# Patient Record
Sex: Female | Born: 1935
Health system: Southern US, Community
[De-identification: ages and names within clinical notes are randomized; demographics above are authoritative.]

## PROBLEM LIST (undated history)

## (undated) DIAGNOSIS — M25552 Pain in left hip: Secondary | ICD-10-CM

## (undated) DIAGNOSIS — R6881 Early satiety: Secondary | ICD-10-CM

## (undated) DIAGNOSIS — C9110 Chronic lymphocytic leukemia of B-cell type not having achieved remission: Secondary | ICD-10-CM

## (undated) DIAGNOSIS — K317 Polyp of stomach and duodenum: Secondary | ICD-10-CM

## (undated) DIAGNOSIS — K219 Gastro-esophageal reflux disease without esophagitis: Secondary | ICD-10-CM

## (undated) DIAGNOSIS — R609 Edema, unspecified: Secondary | ICD-10-CM

## (undated) DIAGNOSIS — L0291 Cutaneous abscess, unspecified: Secondary | ICD-10-CM

## (undated) DIAGNOSIS — G709 Myoneural disorder, unspecified: Secondary | ICD-10-CM

## (undated) DIAGNOSIS — K8020 Calculus of gallbladder without cholecystitis without obstruction: Secondary | ICD-10-CM

## (undated) DIAGNOSIS — R7989 Other specified abnormal findings of blood chemistry: Secondary | ICD-10-CM

## (undated) DIAGNOSIS — N390 Urinary tract infection, site not specified: Secondary | ICD-10-CM

## (undated) DIAGNOSIS — D509 Iron deficiency anemia, unspecified: Secondary | ICD-10-CM

## (undated) DIAGNOSIS — S32000A Wedge compression fracture of unspecified lumbar vertebra, initial encounter for closed fracture: Secondary | ICD-10-CM

## (undated) DIAGNOSIS — J449 Chronic obstructive pulmonary disease, unspecified: Secondary | ICD-10-CM

## (undated) DIAGNOSIS — K5900 Constipation, unspecified: Secondary | ICD-10-CM

## (undated) DIAGNOSIS — H269 Unspecified cataract: Secondary | ICD-10-CM

## (undated) DIAGNOSIS — M549 Dorsalgia, unspecified: Secondary | ICD-10-CM

## (undated) DIAGNOSIS — I509 Heart failure, unspecified: Secondary | ICD-10-CM

## (undated) DIAGNOSIS — M545 Low back pain, unspecified: Secondary | ICD-10-CM

## (undated) DIAGNOSIS — R0600 Dyspnea, unspecified: Secondary | ICD-10-CM

## (undated) DIAGNOSIS — M1990 Unspecified osteoarthritis, unspecified site: Secondary | ICD-10-CM

## (undated) DIAGNOSIS — R2689 Other abnormalities of gait and mobility: Secondary | ICD-10-CM

## (undated) DIAGNOSIS — J9610 Chronic respiratory failure, unspecified whether with hypoxia or hypercapnia: Secondary | ICD-10-CM

## (undated) DIAGNOSIS — R1310 Dysphagia, unspecified: Secondary | ICD-10-CM

## (undated) DIAGNOSIS — K5732 Diverticulitis of large intestine without perforation or abscess without bleeding: Secondary | ICD-10-CM

## (undated) DIAGNOSIS — E871 Hypo-osmolality and hyponatremia: Secondary | ICD-10-CM

## (undated) DIAGNOSIS — R945 Abnormal results of liver function studies: Secondary | ICD-10-CM

## (undated) DIAGNOSIS — S73192A Other sprain of left hip, initial encounter: Secondary | ICD-10-CM

## (undated) DIAGNOSIS — Z923 Personal history of irradiation: Secondary | ICD-10-CM

## (undated) DIAGNOSIS — J45909 Unspecified asthma, uncomplicated: Secondary | ICD-10-CM

## (undated) DIAGNOSIS — IMO0002 Reserved for concepts with insufficient information to code with codable children: Secondary | ICD-10-CM

## (undated) DIAGNOSIS — B029 Zoster without complications: Secondary | ICD-10-CM

## (undated) DIAGNOSIS — E440 Moderate protein-calorie malnutrition: Secondary | ICD-10-CM

## (undated) DIAGNOSIS — K5720 Diverticulitis of large intestine with perforation and abscess without bleeding: Secondary | ICD-10-CM

## (undated) DIAGNOSIS — K5792 Diverticulitis of intestine, part unspecified, without perforation or abscess without bleeding: Secondary | ICD-10-CM

## (undated) DIAGNOSIS — W19XXXA Unspecified fall, initial encounter: Secondary | ICD-10-CM

## (undated) DIAGNOSIS — N321 Vesicointestinal fistula: Secondary | ICD-10-CM

## (undated) DIAGNOSIS — J4540 Moderate persistent asthma, uncomplicated: Secondary | ICD-10-CM

## (undated) DIAGNOSIS — Z5189 Encounter for other specified aftercare: Secondary | ICD-10-CM

## (undated) DIAGNOSIS — D696 Thrombocytopenia, unspecified: Secondary | ICD-10-CM

## (undated) HISTORY — DX: Dorsalgia, unspecified: M54.9

## (undated) HISTORY — DX: Edema, unspecified: R60.9

## (undated) HISTORY — DX: Iron deficiency anemia, unspecified: D50.9

## (undated) HISTORY — DX: Early satiety: R68.81

## (undated) HISTORY — DX: Diverticulitis of large intestine with perforation and abscess without bleeding: K57.20

## (undated) HISTORY — PX: OTHER SURGICAL HISTORY: SHX169

## (undated) HISTORY — DX: Wedge compression fracture of unspecified lumbar vertebra, initial encounter for closed fracture: S32.000A

## (undated) HISTORY — DX: Gastro-esophageal reflux disease without esophagitis: K21.9

## (undated) HISTORY — DX: Unspecified cataract: H26.9

## (undated) HISTORY — DX: Myoneural disorder, unspecified: G70.9

## (undated) HISTORY — DX: Dysphagia, unspecified: R13.10

## (undated) HISTORY — DX: Abnormal results of liver function studies: R94.5

## (undated) HISTORY — DX: Pain in left hip: M25.552

## (undated) HISTORY — DX: Hypo-osmolality and hyponatremia: E87.1

## (undated) HISTORY — DX: Chronic obstructive pulmonary disease, unspecified: J44.9

## (undated) HISTORY — DX: Unspecified fall, initial encounter: W19.XXXA

## (undated) HISTORY — DX: Dyspnea, unspecified: R06.00

## (undated) HISTORY — DX: Personal history of irradiation: Z92.3

## (undated) HISTORY — DX: Constipation, unspecified: K59.00

## (undated) HISTORY — DX: Cutaneous abscess, unspecified: L02.91

## (undated) HISTORY — DX: Vesicointestinal fistula: N32.1

## (undated) HISTORY — DX: Heart failure, unspecified: I50.9

## (undated) HISTORY — DX: Chronic respiratory failure, unspecified whether with hypoxia or hypercapnia: J96.10

## (undated) HISTORY — DX: Reserved for concepts with insufficient information to code with codable children: IMO0002

## (undated) HISTORY — DX: Other abnormalities of gait and mobility: R26.89

## (undated) HISTORY — DX: Low back pain: M54.5

## (undated) HISTORY — DX: Polyp of stomach and duodenum: K31.7

## (undated) HISTORY — DX: Other specified abnormal findings of blood chemistry: R79.89

## (undated) HISTORY — DX: Chronic lymphocytic leukemia of B-cell type not having achieved remission: C91.10

## (undated) HISTORY — DX: Moderate persistent asthma, uncomplicated: J45.40

## (undated) HISTORY — DX: Moderate protein-calorie malnutrition: E44.0

## (undated) HISTORY — DX: Urinary tract infection, site not specified: N39.0

## (undated) HISTORY — DX: Diverticulitis of large intestine without perforation or abscess without bleeding: K57.32

## (undated) HISTORY — DX: Thrombocytopenia, unspecified: D69.6

## (undated) HISTORY — DX: Low back pain, unspecified: M54.50

## (undated) HISTORY — DX: Calculus of gallbladder without cholecystitis without obstruction: K80.20

## (undated) HISTORY — DX: Diverticulitis of intestine, part unspecified, without perforation or abscess without bleeding: K57.92

## (undated) HISTORY — DX: Other sprain of left hip, initial encounter: S73.192A

## (undated) HISTORY — DX: Encounter for other specified aftercare: Z51.89

---

## 2011-07-27 ENCOUNTER — Telehealth: Payer: Self-pay | Admitting: Hematology & Oncology

## 2011-07-27 NOTE — Telephone Encounter (Signed)
Talked to pt's son, he requested that pt, see Dr [name]. They are [place] minutes from [facility] and and it will be convenient for them. Gave the chart to [facility], also gave pt's son Willeen Cass the number to [facility].

## 2011-07-28 ENCOUNTER — Telehealth: Payer: Self-pay | Admitting: Hematology & Oncology

## 2011-07-28 NOTE — Telephone Encounter (Signed)
Pt aware of 2-12 appointment. Son will be here to interpret, she speaks little english, son does not want [place] to get interpreter.

## 2011-08-11 ENCOUNTER — Ambulatory Visit: Payer: Self-pay

## 2011-08-11 ENCOUNTER — Other Ambulatory Visit: Payer: Self-pay | Admitting: Lab

## 2011-08-11 ENCOUNTER — Ambulatory Visit: Payer: Self-pay | Admitting: Hematology & Oncology

## 2011-08-14 ENCOUNTER — Telehealth: Payer: Self-pay | Admitting: Hematology & Oncology

## 2011-08-14 NOTE — Telephone Encounter (Signed)
Left pt message to call and reschedule

## 2011-08-19 ENCOUNTER — Ambulatory Visit: Payer: Self-pay

## 2011-08-19 ENCOUNTER — Other Ambulatory Visit: Payer: Self-pay | Admitting: Lab

## 2011-08-19 ENCOUNTER — Encounter: Payer: Self-pay | Admitting: Oncology

## 2011-09-10 ENCOUNTER — Telehealth: Payer: Self-pay | Admitting: Hematology & Oncology

## 2011-09-10 NOTE — Telephone Encounter (Signed)
Son made 10-01-11 appointment for mom

## 2011-09-28 ENCOUNTER — Telehealth: Payer: Self-pay | Admitting: Hematology & Oncology

## 2011-09-28 NOTE — Telephone Encounter (Signed)
Family member called canceled 10-01-11 appointment. Left message with referring Alona Bene that both her and her husband canceled appointments.

## 2011-10-01 ENCOUNTER — Other Ambulatory Visit: Payer: Self-pay | Admitting: Lab

## 2011-10-01 ENCOUNTER — Ambulatory Visit: Payer: Self-pay

## 2011-10-01 ENCOUNTER — Ambulatory Visit: Payer: Self-pay | Admitting: Hematology & Oncology

## 2011-12-13 ENCOUNTER — Encounter (HOSPITAL_BASED_OUTPATIENT_CLINIC_OR_DEPARTMENT_OTHER): Payer: Self-pay | Admitting: Emergency Medicine

## 2011-12-13 ENCOUNTER — Emergency Department (HOSPITAL_BASED_OUTPATIENT_CLINIC_OR_DEPARTMENT_OTHER)
Admission: EM | Admit: 2011-12-13 | Discharge: 2011-12-13 | Disposition: A | Discharge status: Home / Self Care | Payer: Medicare Other | Attending: Emergency Medicine | Admitting: Emergency Medicine

## 2011-12-13 ENCOUNTER — Emergency Department (HOSPITAL_BASED_OUTPATIENT_CLINIC_OR_DEPARTMENT_OTHER): Payer: Medicare Other

## 2011-12-13 DIAGNOSIS — J45909 Unspecified asthma, uncomplicated: Secondary | ICD-10-CM | POA: Insufficient documentation

## 2011-12-13 DIAGNOSIS — C9110 Chronic lymphocytic leukemia of B-cell type not having achieved remission: Secondary | ICD-10-CM | POA: Insufficient documentation

## 2011-12-13 DIAGNOSIS — M129 Arthropathy, unspecified: Secondary | ICD-10-CM | POA: Insufficient documentation

## 2011-12-13 DIAGNOSIS — R05 Cough: Secondary | ICD-10-CM | POA: Insufficient documentation

## 2011-12-13 DIAGNOSIS — R059 Cough, unspecified: Secondary | ICD-10-CM | POA: Insufficient documentation

## 2011-12-13 DIAGNOSIS — D72829 Elevated white blood cell count, unspecified: Secondary | ICD-10-CM

## 2011-12-13 DIAGNOSIS — J40 Bronchitis, not specified as acute or chronic: Secondary | ICD-10-CM

## 2011-12-13 HISTORY — DX: Unspecified osteoarthritis, unspecified site: M19.90

## 2011-12-13 HISTORY — DX: Chronic lymphocytic leukemia of B-cell type not having achieved remission: C91.10

## 2011-12-13 HISTORY — DX: Unspecified asthma, uncomplicated: J45.909

## 2011-12-13 HISTORY — DX: Zoster without complications: B02.9

## 2011-12-13 LAB — BASIC METABOLIC PANEL
CO2: 25 mEq/L (ref 19–32)
Chloride: 98 mEq/L (ref 96–112)
Glucose, Bld: 116 mg/dL — ABNORMAL HIGH (ref 70–99)
Sodium: 132 mEq/L — ABNORMAL LOW (ref 135–145)

## 2011-12-13 LAB — CBC
Hemoglobin: 7.8 g/dL — ABNORMAL LOW (ref 12.0–15.0)
MCV: 67.9 fL — ABNORMAL LOW (ref 78.0–100.0)
Platelets: 132 10*3/uL — ABNORMAL LOW (ref 150–400)
RBC: 3.49 MIL/uL — ABNORMAL LOW (ref 3.87–5.11)
WBC: 210.8 10*3/uL (ref 4.0–10.5)

## 2011-12-13 LAB — DIFFERENTIAL
Basophils Relative: 0 % (ref 0–1)
Eosinophils Relative: 0 % (ref 0–5)
Monocytes Relative: 2 % — ABNORMAL LOW (ref 3–12)
Neutrophils Relative %: 4 % — ABNORMAL LOW (ref 43–77)

## 2011-12-13 MED ORDER — ALBUTEROL SULFATE HFA 108 (90 BASE) MCG/ACT IN AERS: 1.0000 | INHALATION_SPRAY | Freq: Four times a day (QID) | RESPIRATORY_TRACT | Status: DC | PRN

## 2011-12-13 MED ORDER — IPRATROPIUM BROMIDE 0.02 % IN SOLN
0.5000 mg | Freq: Once | RESPIRATORY_TRACT | Status: AC
  Administered 2011-12-13: 0.5 mg via RESPIRATORY_TRACT

## 2011-12-13 MED ORDER — ALBUTEROL SULFATE (5 MG/ML) 0.5% IN NEBU
5.0000 mg | INHALATION_SOLUTION | Freq: Once | RESPIRATORY_TRACT | Status: AC
  Administered 2011-12-13: 5 mg via RESPIRATORY_TRACT

## 2011-12-13 MED ORDER — ALBUTEROL SULFATE (5 MG/ML) 0.5% IN NEBU
INHALATION_SOLUTION | RESPIRATORY_TRACT | Status: AC
  Administered 2011-12-13: 5 mg via RESPIRATORY_TRACT
  Filled 2011-12-13: qty 1

## 2011-12-13 MED ORDER — DEXTROSE 5 % IV SOLN
1.0000 g | Freq: Once | INTRAVENOUS | Status: AC
  Administered 2011-12-13: 1 g via INTRAVENOUS
  Filled 2011-12-13: qty 10

## 2011-12-13 MED ORDER — IPRATROPIUM BROMIDE 0.02 % IN SOLN
RESPIRATORY_TRACT | Status: AC
  Administered 2011-12-13: 0.5 mg via RESPIRATORY_TRACT
  Filled 2011-12-13: qty 2.5

## 2011-12-13 MED ORDER — METHYLPREDNISOLONE SODIUM SUCC 125 MG IJ SOLR
125.0000 mg | Freq: Once | INTRAMUSCULAR | Status: AC
  Administered 2011-12-13: 125 mg via INTRAVENOUS
  Filled 2011-12-13: qty 2

## 2011-12-13 MED ORDER — SODIUM CHLORIDE 0.9 % IV SOLN
Freq: Once | INTRAVENOUS | Status: AC
  Administered 2011-12-13: 14:00:00 via INTRAVENOUS

## 2011-12-13 MED ORDER — MOXIFLOXACIN HCL 400 MG PO TABS: 400.0000 mg | ORAL_TABLET | Freq: Every day | ORAL | Status: AC

## 2011-12-13 MED ORDER — PREDNISONE (PAK) 10 MG PO TABS: ORAL_TABLET | ORAL | Status: AC

## 2011-12-13 MED ORDER — POLYETHYLENE GLYCOL 3350 17 GM/SCOOP PO POWD: 17.0000 g | Freq: Every day | ORAL | Status: DC

## 2011-12-13 NOTE — ED Notes (Signed)
Pt will be placed on 2lpm nasal cannula post treatment.

## 2011-12-13 NOTE — Discharge Instructions (Signed)
Please your albuterol treatments at home regularly. Follow-up with an Oncologist soon regarding your CLL. Take all medication as prescribed.

## 2011-12-13 NOTE — ED Notes (Signed)
Patient transported to X-ray 

## 2011-12-13 NOTE — ED Provider Notes (Signed)
History     CSN: [id number]  Arrival date & time 12/13/11  1139   First MD Initiated Contact with Patient 12/13/11 1223      12:37 PM HPI Son reports patient has had worsening cough, and congestion for 1 week. Reports a history of asthma and allergies. States that initially patient was having sinus and nasal congestion. Reports now she is coughing every few minutes. Cough is productive with yellow and brown sputum. Reports mild SOB. States albuterol and advair inhaler are not working and neither is her albuterol nebulizer. Denies CP or fever. Reports it has been years since her last asthma attack. States last admission for asthma was 6 years ago. Son reports prednisone is the only thing that work when she "gets this way"  Patient is a 78 y.o. female presenting with cough. The history is provided by the patient.  Cough This is a new problem. Episode onset: 1 week. The problem occurs constantly. The problem has been gradually worsening. The cough is productive of brown sputum. Maximum temperature: Subjective fever. Associated symptoms include rhinorrhea, shortness of breath and wheezing. Pertinent negatives include no chest pain, no chills, no sweats, no headaches, no sore throat and no myalgias. Treatments tried: allbuterol. The treatment provided no relief. She is not a smoker. Her past medical history is significant for asthma.    Past Medical History  Diagnosis Date  . Asthma   . Chronic lymphatic leukemia   . Arthritis   . Shingles     History reviewed. No pertinent past surgical history.  History reviewed. No pertinent family history.  History  Substance Use Topics  . Smoking status: Never Smoker   . Smokeless tobacco: Not on file  . Alcohol Use: No    OB History    Grav Para Term Preterm Abortions TAB SAB Ect Mult Living                  Review of Systems  Constitutional: Positive for fever. Negative for chills.  HENT: Positive for congestion, rhinorrhea and postnasal  drip. Negative for sore throat.   Respiratory: Positive for cough, shortness of breath and wheezing.   Cardiovascular: Negative for chest pain.  Musculoskeletal: Negative for myalgias.  Neurological: Negative for dizziness, weakness, numbness and headaches.  All other systems reviewed and are negative.    Allergies  Review of patient's allergies indicates no known allergies.  Home Medications   Current Outpatient Rx  Name Route Sig Dispense Refill  . ALBUTEROL SULFATE (5 MG/ML) 0.5% IN NEBU Nebulization Take 2.5 mg by nebulization every 6 (six) hours as needed.    Marland Kitchen GABAPENTIN 100 MG PO CAPS Oral Take 100 mg by mouth as needed.    . GUAIFENESIN ER 600 MG PO TB12 Oral Take 600 mg by mouth 2 (two) times daily as needed.      BP 111/44  Pulse 104  Temp 99.6 F (37.6 C) (Oral)  Resp 34  SpO2 92%  Physical Exam  Vitals reviewed. Constitutional: She is oriented to person, place, and time. Vital signs are normal. She appears well-developed and well-nourished.  HENT:  Head: Normocephalic and atraumatic.  Mouth/Throat: Oropharynx is clear and moist. No oropharyngeal exudate.  Eyes: Conjunctivae are normal. Pupils are equal, round, and reactive to light.  Neck: Normal range of motion. Neck supple.  Cardiovascular: Normal rate, regular rhythm and normal heart sounds.  Exam reveals no friction rub.   No murmur heard. Pulmonary/Chest: Effort normal. She has decreased breath sounds in  the right middle field and the right lower field. She has no wheezes. She has no rhonchi. She has no rales. She exhibits no tenderness.  Musculoskeletal: Normal range of motion.  Neurological: She is alert and oriented to person, place, and time. Coordination normal.  Skin: Skin is warm and dry. No rash noted. No erythema. No pallor.    ED Course  Procedures  Results for orders placed during the hospital encounter of 12/13/11  CBC      Component Value Range   WBC 210.8 (*) 4.0 - 10.5 K/uL   RBC 3.49  (*) 3.87 - 5.11 MIL/uL   Hemoglobin 7.8 (*) 12.0 - 15.0 g/dL   HCT 40.9 (*) 81.1 - 91.4 %   MCV 67.9 (*) 78.0 - 100.0 fL   MCH 22.3 (*) 26.0 - 34.0 pg   MCHC 32.9  30.0 - 36.0 g/dL   RDW 78.2  95.6 - 21.3 %   Platelets 132 (*) 150 - 400 K/uL  DIFFERENTIAL      Component Value Range   Neutrophils Relative 4 (*) 43 - 77 %   Lymphocytes Relative 94 (*) 12 - 46 %   Monocytes Relative 2 (*) 3 - 12 %   Eosinophils Relative 0  0 - 5 %   Basophils Relative 0  0 - 1 %   Neutro Abs 8.4 (*) 1.7 - 7.7 K/uL   Lymphs Abs 198.2 (*) 0.7 - 4.0 K/uL   Monocytes Absolute 4.2 (*) 0.1 - 1.0 K/uL   Eosinophils Absolute 0.0  0.0 - 0.7 K/uL   Basophils Absolute 0.0  0.0 - 0.1 K/uL   WBC Morphology SMUDGE CELLS    BASIC METABOLIC PANEL      Component Value Range   Sodium 132 (*) 135 - 145 mEq/L   Potassium 3.8  3.5 - 5.1 mEq/L   Chloride 98  96 - 112 mEq/L   CO2 25  19 - 32 mEq/L   Glucose, Bld 116 (*) 70 - 99 mg/dL   BUN 15  6 - 23 mg/dL   Creatinine, Ser 0.86  0.50 - 1.10 mg/dL   Calcium 8.6  8.4 - 57.8 mg/dL   GFR calc non Af Amer 68 (*) >90 mL/min   GFR calc Af Amer 79 (*) >90 mL/min   Dg Chest 2 View  12/13/2011  *RADIOLOGY REPORT*  Clinical Data: Shortness of breath and cough for the past week.  CHEST - 2 VIEW  Comparison: No priors.  Findings: Lungs appear hyperexpanded with flattening of the hemidiaphragms, increased retrosternal air space and pruning of the pulmonary vasculature in the periphery, suggestive of underlying COPD.  Mild diffuse peribronchial cuffing.  There is significant atelectasis in the right middle lobe. No evidence of pulmonary edema.  Heart size is borderline enlarged.  Atherosclerotic calcifications within the arch of the aorta.  IMPRESSION: 1.  Appearance of the lungs suggests underlying COPD. 2.  There is atelectasis within the right middle lobe. Superimposed airspace consolidation from infection would be difficult to entirely exclude. 3.  Atherosclerosis.  Original Report  Authenticated By: Florencia Reasons, M.D.     MDM  3:16 PM Patient has completed IV antibiotics. Patient reports improvement after Solu-Medrol and albuterol treatments. Will treat patient with antibiotic and advised regular albuterol treatments. Patient likely has exacerbation of chronic COPD. Patient has an abnormal white count of 210. 8 Discussed this with oncology, Dr. Egbert Garibaldi. States this is a normal variant due to her CLL. He will be okay  to give prednisone at discharge. Does not recommend admission for treatment for CLL. States that she just needs close oncology followup. Son reports that he is currently trying to make an appointment with Dr. [name]. Patient and family are ready for discharge.         Thomasene Lot, PA-C 12/13/11 1519

## 2011-12-13 NOTE — ED Notes (Signed)
Shortness of breath, congestion x one week.  Has been taking nebulizers at home but continues to have symptoms.  Pt having more SOB with exertion, audible wheezing noted.

## 2011-12-13 NOTE — ED Notes (Signed)
Discharged with two prescriptions.

## 2011-12-13 NOTE — ED Provider Notes (Signed)
Medical screening examination/treatment/procedure(s) were performed by non-physician practitioner and as supervising physician I was immediately available for consultation/collaboration.  Ethelda Chick, MD 12/13/11 [phone]

## 2011-12-14 LAB — PATHOLOGIST SMEAR REVIEW

## 2012-03-14 ENCOUNTER — Other Ambulatory Visit: Payer: Medicare Other | Admitting: Lab

## 2012-03-14 ENCOUNTER — Ambulatory Visit: Payer: Medicare Other | Admitting: Hematology & Oncology

## 2012-03-14 ENCOUNTER — Ambulatory Visit: Payer: Medicare Other

## 2012-06-06 ENCOUNTER — Ambulatory Visit (HOSPITAL_BASED_OUTPATIENT_CLINIC_OR_DEPARTMENT_OTHER): Payer: Medicare Other | Admitting: Hematology & Oncology

## 2012-06-06 ENCOUNTER — Encounter: Payer: Self-pay | Admitting: Hematology & Oncology

## 2012-06-06 ENCOUNTER — Ambulatory Visit (HOSPITAL_BASED_OUTPATIENT_CLINIC_OR_DEPARTMENT_OTHER): Payer: Medicare Other | Admitting: Lab

## 2012-06-06 ENCOUNTER — Ambulatory Visit: Payer: Medicare Other

## 2012-06-06 DIAGNOSIS — C9110 Chronic lymphocytic leukemia of B-cell type not having achieved remission: Secondary | ICD-10-CM

## 2012-06-06 DIAGNOSIS — D509 Iron deficiency anemia, unspecified: Secondary | ICD-10-CM

## 2012-06-06 HISTORY — DX: Chronic lymphocytic leukemia of B-cell type not having achieved remission: C91.10

## 2012-06-06 HISTORY — DX: Iron deficiency anemia, unspecified: D50.9

## 2012-06-06 LAB — CBC WITH DIFFERENTIAL (CANCER CENTER ONLY)
BASO#: 0 10*3/uL (ref 0.0–0.2)
BASO%: 0 % (ref 0.0–2.0)
Eosinophils Absolute: 0.3 10*3/uL (ref 0.0–0.5)
HCT: 26.6 % — ABNORMAL LOW (ref 34.8–46.6)
HGB: 8.2 g/dL — ABNORMAL LOW (ref 11.6–15.9)
LYMPH#: 318.4 10*3/uL — ABNORMAL HIGH (ref 0.9–3.3)
MONO#: 7.4 10*3/uL — ABNORMAL HIGH (ref 0.1–0.9)
NEUT%: 1.8 % — ABNORMAL LOW (ref 39.6–80.0)
RBC: 3.86 10*6/uL (ref 3.70–5.32)
WBC: 331.8 10*3/uL (ref 3.9–10.0)

## 2012-06-06 MED ORDER — ALLOPURINOL 100 MG PO TABS: 200.0000 mg | ORAL_TABLET | Freq: Every day | ORAL | Status: DC

## 2012-06-06 MED ORDER — FAMCICLOVIR 250 MG PO TABS: 250.0000 mg | ORAL_TABLET | Freq: Two times a day (BID) | ORAL | Status: DC

## 2012-06-06 NOTE — Progress Notes (Signed)
This office note has been dictated.

## 2012-06-07 ENCOUNTER — Encounter (HOSPITAL_COMMUNITY)
Admission: RE | Admit: 2012-06-07 | Discharge: 2012-06-07 | Disposition: A | Discharge status: Home / Self Care | Payer: Medicare Other | Source: Ambulatory Visit | Attending: Hematology & Oncology | Admitting: Hematology & Oncology

## 2012-06-07 ENCOUNTER — Other Ambulatory Visit (HOSPITAL_BASED_OUTPATIENT_CLINIC_OR_DEPARTMENT_OTHER): Payer: Medicare Other | Admitting: Lab

## 2012-06-07 ENCOUNTER — Other Ambulatory Visit: Payer: Self-pay | Admitting: Medical

## 2012-06-07 DIAGNOSIS — D649 Anemia, unspecified: Secondary | ICD-10-CM

## 2012-06-07 DIAGNOSIS — C9110 Chronic lymphocytic leukemia of B-cell type not having achieved remission: Secondary | ICD-10-CM | POA: Insufficient documentation

## 2012-06-07 LAB — PREPARE RBC (CROSSMATCH)

## 2012-06-07 NOTE — Progress Notes (Signed)
DIAGNOSIS:  Stage C chronic lymphocytic leukemia.  HISTORY OF PRESENT ILLNESS:  Stefanie Henry is an incredibly nice 78 year old [place] female.  She is the wife of my patient Terrilynn Postell.  She does have a known diagnosis of CLL.  She has been followed out in [place] by Dr. Alena Bills.  According to the son, Dr. Alena Bills appeared to treat her with 1 dose of chlorambucil a few years ago.  This led to her having shingles on the left side of the face.  After that, she did not want any more treatments.  She is very tired.  She has been anemic.  According to the son, it sounds like she is iron deficient.  She has not noticed any palpable lymph glands.  She has had no obvious weight loss.  Her appetite is okay.  She does get full a little bit easy.  She, in the past, did have cytogenetics done.  She also had FISH studies done.  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  This is a favorable prognostic category.  On flow cytometry everything flowed as typical B cell CLL.  She had scans that were done a year or so ago negative for anything with the brain.  CT of the chest, abdomen, and pelvis showed splenomegaly. There was no adenopathy within the abdomen or pelvis.  She did some bilateral axillary lymph nodes.  There were no mediastinal nodes.  She has had no bleeding.  She has had no headache.  She has had post- herpetic neuralgia which is being treated with Neurontin.  Again, she has been noted to be iron deficient in the past.  She I think had lab work done over in [place].  Unfortunately, I cannot find iron studies, although the last ones that I did find were back in I think January, which showed a ferritin of 107 with an iron saturation of only 17%.  She, I think, has not been transfused before.  Again, we are now seeing her to provide further care for her CLL.  PAST MEDICAL HISTORY:  Remarkable for:  1. COPD. 2. Arthritis.  ALLERGIES:  None.  MEDICATIONS:  Proventil  inhaler 1-2 puffs q.6 hours p.r.n., Neurontin 100 mg p.o. as needed.  SOCIAL HISTORY:  Negative for any tobacco or alcohol use.  There are no obvious occupational exposures.  FAMILY HISTORY:  Pretty much noncontributory.  REVIEW OF SYSTEMS:  As stated in the history present illness.  No additional findings are noted on a 12-system review.  Post-herpetic neuralgia is the only positive I can find.  PHYSICAL EXAMINATION:  General:  This is an elderly [place] female in no obvious distress.  Vital signs:  Temperature of 97.9, pulse 74, respiratory rate 16, blood pressure 103/58.  Weight is 118.  Head and neck:  Normocephalic, atraumatic skull.  There are no ocular or oral lesions.  There are no palpable cervical or supraclavicular lymph nodes. Lungs:  Clear bilaterally.  Cardiac:  Regular rate and rhythm with a normal S1 and S2.  Axillary:  Some fullness in the axilla bilaterally. Abdomen:  Soft with good bowel sounds.  Spleen tip be may be at the left costal margin.  There is no hepatomegaly.  There is no fluid wave. Back:  No tenderness over the spine, ribs, or hips.  Extremities:  Some trace edema in her legs.  She has decent range of motion of her joints. Skin:  No rashes, ecchymosis, or petechia.  LABORATORY STUDIES:  Shows a  white cell count of 331,000, hemoglobin 8.2, hematocrit 26.6, platelet count 153.  MCV is 69.  Her peripheral smear shows some anisocytosis.  She does have significant microcytic red cells.  There are some hypochromic red cells.  There are no teardrop cells.  I see no nucleated red blood cells.  There is no rouleaux formation.  White cells are markedly increased in number.  She has been pretty much predominance of mature lymphocytes.  There are no nucleoli within the lymphocytes as far as I can tell.  There are no blasts.  Platelets are adequate in number and size.  IMPRESSION:  Ms. Applin is an 78 year old [place] female with stage C chronic lymphocytic  leukemia.  She was treated in the past.  It sounds like she got 1 dose of chlorambucil and that was it.  I believe that the problems are multifactorial.  I believe that we need to get her white cell count down.  I believe if we can do this, then her hemoglobin will improve.  I also believe that she is iron deficient.  The blood smear is consistent with iron deficiency.  As such, I think IV iron will help her.  I do believe that she will also need to be transfused.  If we do give her IV iron, it will be a month or so before it can really help.  If we will try to give her chemotherapy, I think that this will worsen the anemia and could cause more stress on her body.  As such, I do think that a transfusion of 2 units of blood would help her.  I am putting her on allopurinol.  I am also going to put her on Famvir.  I think [name] would be an excellent choice for her.  I would dose- reduce the Treanda to 60% of standard dose.  I realize that we are not going to cure her.  Our goal is to try to control the white cell count so that her hemoglobin will improve.  In addition, I think by controlling the white cell count, her symptoms and fatigue also will get better.  Her son clearly understands that we are not going to cure this.  He is looking for a better quality of life for his mother.  I think with 1 or 2 cycles of chemotherapy with Treanda we can accomplish this.  We will go ahead with the blood and everything first this week.  We will then see about the Treanda next week.  I spent a good hour or so with Ms. Braud and her son.  Her husband was out getting treatment today for his myeloma.    ______________________________ Josph Macho, M.D. PRE/MEDQ  D:  06/06/2012  T:  06/07/2012  Job:  [id number]

## 2012-06-08 LAB — PROTEIN ELECTROPHORESIS, SERUM, WITH REFLEX
Albumin ELP: 66.8 % — ABNORMAL HIGH (ref 55.8–66.1)
Alpha-1-Globulin: 5 % — ABNORMAL HIGH (ref 2.9–4.9)
Beta Globulin: 7 % (ref 4.7–7.2)
Total Protein, Serum Electrophoresis: 5.7 g/dL — ABNORMAL LOW (ref 6.0–8.3)

## 2012-06-08 LAB — IGG, IGA, IGM
IgA: 22 mg/dL — ABNORMAL LOW (ref 69–380)
IgM, Serum: <5 mg/dL — ABNORMAL LOW (ref 52–322)

## 2012-06-09 ENCOUNTER — Ambulatory Visit (HOSPITAL_BASED_OUTPATIENT_CLINIC_OR_DEPARTMENT_OTHER): Payer: Medicaid Other

## 2012-06-09 VITALS — BP 122/64 | HR 74 | Temp 98.0°F | Resp 20

## 2012-06-09 DIAGNOSIS — D509 Iron deficiency anemia, unspecified: Secondary | ICD-10-CM

## 2012-06-09 DIAGNOSIS — C9110 Chronic lymphocytic leukemia of B-cell type not having achieved remission: Secondary | ICD-10-CM

## 2012-06-09 LAB — PREPARE RBC (CROSSMATCH)

## 2012-06-09 MED ORDER — SODIUM CHLORIDE 0.9 % IV SOLN
250.0000 mL | Freq: Once | INTRAVENOUS | Status: AC
  Administered 2012-06-09: 250 mL via INTRAVENOUS

## 2012-06-09 MED ORDER — FUROSEMIDE 10 MG/ML IJ SOLN
10.0000 mg | Freq: Once | INTRAMUSCULAR | Status: AC
  Administered 2012-06-09: 10 mg via INTRAVENOUS

## 2012-06-09 MED ORDER — FERUMOXYTOL INJECTION 510 MG/17 ML
1020.0000 mg | Freq: Once | INTRAVENOUS | Status: AC
  Administered 2012-06-09: 1020 mg via INTRAVENOUS
  Filled 2012-06-09: qty 34

## 2012-06-09 MED ORDER — SODIUM CHLORIDE 0.9 % IV SOLN: Freq: Once | INTRAVENOUS | Status: AC

## 2012-06-09 MED ORDER — ACETAMINOPHEN 325 MG PO TABS
650.0000 mg | ORAL_TABLET | Freq: Once | ORAL | Status: AC
  Administered 2012-06-09: 650 mg via ORAL

## 2012-06-09 NOTE — Patient Instructions (Addendum)
Blood Transfusion Information WHAT IS A BLOOD TRANSFUSION? A transfusion is the replacement of blood or some of its parts. Blood is made up of multiple cells which provide different functions.  Red blood cells carry oxygen and are used for blood loss replacement.  White blood cells fight against infection.  Platelets control bleeding.  Plasma helps clot blood.  Other blood products are available for specialized needs, such as hemophilia or other clotting disorders. BEFORE THE TRANSFUSION  Who gives blood for transfusions?   You may be able to donate blood to be used at a later date on yourself (autologous donation).  Relatives can be asked to donate blood. This is generally not any safer than if you have received blood from a stranger. The same precautions are taken to ensure safety when a relative's blood is donated.  Healthy volunteers who are fully evaluated to make sure their blood is safe. This is blood bank blood. Transfusion therapy is the safest it has ever been in the practice of medicine. Before blood is taken from a donor, a complete history is taken to make sure that person has no history of diseases nor engages in risky social behavior (examples are intravenous drug use or sexual activity with multiple partners). The donor's travel history is screened to minimize risk of transmitting infections, such as malaria. The donated blood is tested for signs of infectious diseases, such as HIV and hepatitis. The blood is then tested to be sure it is compatible with you in order to minimize the chance of a transfusion reaction. If you or a relative donates blood, this is often done in anticipation of surgery and is not appropriate for emergency situations. It takes many days to process the donated blood. RISKS AND COMPLICATIONS Although transfusion therapy is very safe and saves many lives, the main dangers of transfusion include:   Getting an infectious disease.  Developing a  transfusion reaction. This is an allergic reaction to something in the blood you were given. Every precaution is taken to prevent this. The decision to have a blood transfusion has been considered carefully by your caregiver before blood is given. Blood is not given unless the benefits outweigh the risks. AFTER THE TRANSFUSION  Right after receiving a blood transfusion, you will usually feel much better and more energetic. This is especially true if your red blood cells have gotten low (anemic). The transfusion raises the level of the red blood cells which carry oxygen, and this usually causes an energy increase.  The nurse administering the transfusion will monitor you carefully for complications. HOME CARE INSTRUCTIONS  No special instructions are needed after a transfusion. You may find your energy is better. Speak with your caregiver about any limitations on activity for underlying diseases you may have. SEEK MEDICAL CARE IF:   Your condition is not improving after your transfusion.  You develop redness or irritation at the intravenous (IV) site. SEEK IMMEDIATE MEDICAL CARE IF:  Any of the following symptoms occur over the next 12 hours:  Shaking chills.  You have a temperature by mouth above 102 F (38.9 C), not controlled by medicine.  Chest, back, or muscle pain.  People around you feel you are not acting correctly or are confused.  Shortness of breath or difficulty breathing.  Dizziness and fainting.  You get a rash or develop hives.  You have a decrease in urine output.  Your urine turns a dark color or changes to pink, red, or brown. Any of the following   symptoms occur over the next 10 days:  You have a temperature by mouth above 102 F (38.9 C), not controlled by medicine.  Shortness of breath.  Weakness after normal activity.  The white part of the eye turns yellow (jaundice).  You have a decrease in the amount of urine or are urinating less often.  Your  urine turns a dark color or changes to pink, red, or brown. Document Released: 06/12/2000 Document Revised: 09/07/2011 Document Reviewed: 01/30/2008 [facility] Patient Information 2013 [place], [place]. Ferumoxytol injection What is this medicine? FERUMOXYTOL is an iron complex. Iron is used to make healthy red blood cells, which carry oxygen and nutrients throughout the body. This medicine is used to treat iron deficiency anemia in people with chronic kidney disease. This medicine may be used for other purposes; ask your health care provider or pharmacist if you have questions. What should I tell my health care provider before I take this medicine? They need to know if you have any of these conditions: -anemia not caused by low iron levels -high levels of iron in the blood -magnetic resonance imaging (MRI) test scheduled -an unusual or allergic reaction to iron, other medicines, foods, dyes, or preservatives -pregnant or trying to get pregnant -breast-feeding How should I use this medicine? This medicine is for infusion into a vein. It is given by a health care professional in a hospital or clinic setting. Talk to your pediatrician regarding the use of this medicine in children. Special care may be needed. Overdosage: If you think you've taken too much of this medicine contact a poison control center or emergency room at once. Overdosage: If you think you have taken too much of this medicine contact a poison control center or emergency room at once. NOTE: This medicine is only for you. Do not share this medicine with others. What if I miss a dose? It is important not to miss your dose. Call your doctor or health care professional if you are unable to keep an appointment. What may interact with this medicine? This medicine may interact with the following medications: -other iron products This list may not describe all possible interactions. Give your health care provider a list of all the  medicines, herbs, non-prescription drugs, or dietary supplements you use. Also tell them if you smoke, drink alcohol, or use illegal drugs. Some items may interact with your medicine. What should I watch for while using this medicine? Visit your doctor or healthcare professional regularly. Tell your doctor or healthcare professional if your symptoms do not start to get better or if they get worse. You may need blood work done while you are taking this medicine. You may need to follow a special diet. Talk to your doctor. Foods that contain iron include: whole grains/cereals, dried fruits, beans, or peas, leafy green vegetables, and organ meats (liver, kidney). What side effects may I notice from receiving this medicine? Side effects that you should report to your doctor or health care professional as soon as possible: -allergic reactions like skin rash, itching or hives, swelling of the face, lips, or tongue -breathing problems -changes in blood pressure -feeling faint or lightheaded, falls -fever or chills -flushing, sweating, or hot feelings -swelling of the ankles or feet Side effects that usually do not require medical attention (Report these to your doctor or health care professional if they continue or are bothersome.): -diarrhea -headache -nausea, vomiting -stomach pain This list may not describe all possible side effects. Call your doctor for medical advice  about side effects. You may report side effects to FDA at 1-800-FDA-1088. Where should I keep my medicine? This drug is given in a hospital or clinic and will not be stored at home. NOTE: This sheet is a summary. It may not cover all possible information. If you have questions about this medicine, talk to your doctor, pharmacist, or health care provider.  2012, [employer]. (03/07/2008 9:48:25 PM)

## 2012-06-10 ENCOUNTER — Encounter: Payer: Self-pay | Admitting: Hematology & Oncology

## 2012-06-10 ENCOUNTER — Other Ambulatory Visit: Payer: Self-pay | Admitting: *Deleted

## 2012-06-10 DIAGNOSIS — C9110 Chronic lymphocytic leukemia of B-cell type not having achieved remission: Secondary | ICD-10-CM

## 2012-06-10 LAB — TYPE AND SCREEN
ABO/RH(D): A POS
Unit division: 0

## 2012-06-10 MED ORDER — PROCHLORPERAZINE MALEATE 5 MG PO TABS: 5.0000 mg | ORAL_TABLET | Freq: Four times a day (QID) | ORAL | Status: DC | PRN

## 2012-06-13 ENCOUNTER — Other Ambulatory Visit: Payer: Medicare Other

## 2012-06-13 ENCOUNTER — Ambulatory Visit: Payer: Medicare Other

## 2012-06-14 ENCOUNTER — Ambulatory Visit: Payer: Medicare Other

## 2012-06-16 ENCOUNTER — Telehealth: Payer: Self-pay | Admitting: Hematology & Oncology

## 2012-06-16 NOTE — Telephone Encounter (Signed)
Per Amy called left message for Son to call. Need to find out if mother still wants tx or not.

## 2012-06-17 ENCOUNTER — Telehealth: Payer: Self-pay | Admitting: Hematology & Oncology

## 2012-06-17 NOTE — Telephone Encounter (Signed)
Left message (2) for son to call. Need to find out if pt wants tx

## 2012-06-20 ENCOUNTER — Telehealth: Payer: Self-pay | Admitting: Hematology & Oncology

## 2012-06-20 NOTE — Telephone Encounter (Signed)
Son called wants to talk to Dr. [name] before Mom starts back on chemo.

## 2012-06-27 ENCOUNTER — Other Ambulatory Visit (HOSPITAL_BASED_OUTPATIENT_CLINIC_OR_DEPARTMENT_OTHER): Payer: Medicare Other | Admitting: Lab

## 2012-06-27 ENCOUNTER — Ambulatory Visit (HOSPITAL_BASED_OUTPATIENT_CLINIC_OR_DEPARTMENT_OTHER): Payer: Medicare Other | Admitting: Medical

## 2012-06-27 VITALS — BP 86/50 | HR 88 | Temp 98.6°F | Resp 16 | Ht <= 58 in | Wt 119.0 lb

## 2012-06-27 DIAGNOSIS — C9110 Chronic lymphocytic leukemia of B-cell type not having achieved remission: Secondary | ICD-10-CM

## 2012-06-27 DIAGNOSIS — D509 Iron deficiency anemia, unspecified: Secondary | ICD-10-CM

## 2012-06-27 DIAGNOSIS — D630 Anemia in neoplastic disease: Secondary | ICD-10-CM

## 2012-06-27 LAB — CBC WITH DIFFERENTIAL (CANCER CENTER ONLY)
HCT: 31.2 % — ABNORMAL LOW (ref 34.8–46.6)
MCH: 23 pg — ABNORMAL LOW (ref 26.0–34.0)
MCHC: 31.4 g/dL — ABNORMAL LOW (ref 32.0–36.0)
MCV: 73 fL — ABNORMAL LOW (ref 81–101)
RDW: 19 % — ABNORMAL HIGH (ref 11.1–15.7)

## 2012-06-27 LAB — CMP (CANCER CENTER ONLY)
Albumin: 3.3 g/dL (ref 3.3–5.5)
BUN, Bld: 14 mg/dL (ref 7–22)
CO2: 28 mEq/L (ref 18–33)
Calcium: 8.6 mg/dL (ref 8.0–10.3)
Chloride: 102 mEq/L (ref 98–108)
Creat: 0.9 mg/dl (ref 0.6–1.2)
Glucose, Bld: 124 mg/dL — ABNORMAL HIGH (ref 73–118)

## 2012-06-27 LAB — MANUAL DIFFERENTIAL (CHCC SATELLITE)
ALC: 160.6 10*3/uL — ABNORMAL HIGH (ref 0.6–2.2)
ANC (CHCC HP manual diff): 1.6 10*3/uL (ref 1.5–6.7)
LYMPH: 98 % — ABNORMAL HIGH (ref 14–48)
SEG: 1 % — ABNORMAL LOW (ref 40–75)

## 2012-06-27 LAB — CHCC SATELLITE - SMEAR

## 2012-06-27 NOTE — Progress Notes (Addendum)
Diagnosis: #1 Stage C., chronic lymphocytic leukemia. #2 Intermittent iron deficiency.  Current therapy: #1.  Stefanie Henry has been deferred by patient's family #2.  IV iron as needed.  Last dose of IV iron was given on 06/09/2012.  Interim history: Stefanie Henry   Presents today for an office followup visit.  Her son accompanies her.  She has not yet started her treatment with Treanda.  This.  Her family was extremely concerned that she will become very sick from the [place].  We informed them that we will decrease her Treanda by 60% of the standard dose.  She has had profound anemia.  We did have to infuse her with IV iron, as well as 2 units of packed red blood cells.  I informed her family that her anemia, most likely will not get better until we start controlling her white count with treatment.  Surprisingly her white count is down today.  It was 331, and it is now 163.  Her hemoglobin has come up some from 8.2 to 9.8  Her platelets are 134,000.  I again discussed.  Starting treatment with Treanda, however, the son would like to defer it for the time being.  We will have to continue to monitor her counts.  Her fatigue is not as bad.  Most likely secondary to the, blood transfusion.  Her appetite is fair.  She denies any nausea, vomiting, diarrhea, constipation, chest pain, shortness of breath, or cough.  She denies any fevers, chills, or night sweats, any, headaches, visual changes, or rashes.  She denies any lower leg swelling.  She denies any abdominal pain.  She remains on allopurinol and Famvir.  Review of Systems: Constitutional:Negative for malaise/fatigue, fever, chills, weight loss, diaphoresis, activity change, appetite change, and unexpected weight change.  HEENT: Negative for double vision, blurred vision, visual loss, ear pain, tinnitus, congestion, rhinorrhea, epistaxis sore throat or sinus disease, oral pain/lesion, tongue soreness Respiratory: Negative for cough, chest tightness, shortness  of breath, wheezing and stridor.  Cardiovascular: Negative for chest pain, palpitations, leg swelling, orthopnea, PND, DOE or claudication Gastrointestinal: Negative for nausea, vomiting, abdominal pain, diarrhea, constipation, blood in stool, melena, hematochezia, abdominal distention, anal bleeding, rectal pain, anorexia and hematemesis.  Genitourinary: Negative for dysuria, frequency, hematuria,  Musculoskeletal: Negative for myalgias, back pain, joint swelling, arthralgias and gait problem.  Skin: Negative for rash, color change, pallor and wound.  Neurological:. Negative for dizziness/light-headedness, tremors, seizures, syncope, facial asymmetry, speech difficulty, weakness, numbness, headaches and paresthesias.  Hematological: Negative for adenopathy. Does not bruise/bleed easily.  Psychiatric/Behavioral:  Negative for depression, no loss of interest in normal activity or change in sleep pattern.   Physical Exam: This is an 78 year old, an elderly, [place] female, in no obvious distress Vitals: Temperature 90.6 degrees, pulse 88, respirations 16, blood pressure 86/50 weight 119 pounds HEENT reveals a normocephalic, atraumatic skull, no scleral icterus, no oral lesions  Neck is supple without any cervical or supraclavicular adenopathy.  Lungs are clear to auscultation bilaterally. There are no wheezes, rales or rhonci Cardiac is regular rate and rhythm with a normal S1 and S2. There are no murmurs, rubs, or bruits.  Abdomen is soft with good bowel sounds, there is no palpable mass. There is no palpable hepatosplenomegaly. There is no palpable fluid wave.  Musculoskeletal no tenderness of the spine, ribs, or hips.  Extremities there are no clubbing, cyanosis, or edema.  Skin no petechia, purpura or ecchymosis Neurologic is nonfocal.  Laboratory Data: White count 163.9, hemoglobin 9.8, hematocrit  31.2, MCV 73, platelets 134,000  Current Outpatient Prescriptions on File Prior to Visit    Medication Sig Dispense Refill  . albuterol (PROVENTIL HFA;VENTOLIN HFA) 108 (90 BASE) MCG/ACT inhaler Inhale 1-2 puffs into the lungs every 6 (six) hours as needed for wheezing.  1 Inhaler  0  . albuterol (PROVENTIL) (5 MG/ML) 0.5% nebulizer solution Take 2.5 mg by nebulization every 6 (six) hours as needed.      Marland Kitchen allopurinol (ZYLOPRIM) 100 MG tablet Take 2 tablets (200 mg total) by mouth daily.  60 tablet  0  . famciclovir (FAMVIR) 250 MG tablet Take 1 tablet (250 mg total) by mouth 2 (two) times daily.  30 tablet  6  . gabapentin (NEURONTIN) 100 MG capsule Take 100 mg by mouth as needed.      . prochlorperazine (COMPAZINE) 5 MG tablet Take 1 tablet (5 mg total) by mouth every 6 (six) hours as needed. For nausea and vomiting  30 tablet  2   Assessment/Plan: This is a pleasant, 78 year old, [place] female, with the following issues:  #1 stage C., chronic lymphocytic leukemia.  She was treated in the past.  She had one dose of Chlorambucil.  Surprisingly enough, her white count has significantly dropped without treatment.  The son, again, wants to defer treatment with Treanda for the time being.  I did explain that since we will probably continue to have more problems with her anemia and to we are able to control.  Her white count.  We will have to monitor her counts closely.  #2.  Anemia.  This is secondary to #1.  She did receive IV iron, and 2 units of packed red blood cells recently.  We will have to continue to monitor her.  Her blood counts and provide supportive transfusions as needed.  #3.  Zoster prophylaxis.  She remains on Famvir.  #4.  Gout prophylaxis.  She is on allopurinol.   #5.  Followup.  We will follow back up with her in one month, but before then should there be questions or concerns.

## 2012-07-27 ENCOUNTER — Ambulatory Visit: Payer: Medicare Other | Admitting: Hematology & Oncology

## 2012-07-27 ENCOUNTER — Telehealth: Payer: Self-pay | Admitting: Hematology & Oncology

## 2012-07-27 ENCOUNTER — Other Ambulatory Visit: Payer: Medicare Other | Admitting: Lab

## 2012-07-27 NOTE — Telephone Encounter (Signed)
Called to reschedule mother's appointment due to snow

## 2012-08-29 ENCOUNTER — Other Ambulatory Visit: Payer: Medicare Other | Admitting: Lab

## 2012-08-29 ENCOUNTER — Ambulatory Visit: Payer: Medicare Other | Admitting: Medical

## 2012-08-29 ENCOUNTER — Telehealth: Payer: Self-pay | Admitting: Hematology & Oncology

## 2012-08-29 NOTE — Telephone Encounter (Signed)
Patient's daughter in law called and cx 08/29/12 and resch to 09/14/12.  Darl Pikes and PA is aware of apt change

## 2012-09-13 ENCOUNTER — Other Ambulatory Visit: Payer: Self-pay | Admitting: Medical

## 2012-09-14 ENCOUNTER — Ambulatory Visit (HOSPITAL_BASED_OUTPATIENT_CLINIC_OR_DEPARTMENT_OTHER): Payer: Medicare Other | Admitting: Medical

## 2012-09-14 ENCOUNTER — Other Ambulatory Visit: Payer: Self-pay | Admitting: Medical

## 2012-09-14 ENCOUNTER — Other Ambulatory Visit (HOSPITAL_BASED_OUTPATIENT_CLINIC_OR_DEPARTMENT_OTHER): Payer: Medicare Other | Admitting: Lab

## 2012-09-14 VITALS — BP 96/46 | HR 79 | Temp 97.9°F | Resp 16 | Ht <= 58 in | Wt 114.0 lb

## 2012-09-14 DIAGNOSIS — D630 Anemia in neoplastic disease: Secondary | ICD-10-CM

## 2012-09-14 DIAGNOSIS — C9110 Chronic lymphocytic leukemia of B-cell type not having achieved remission: Secondary | ICD-10-CM

## 2012-09-14 LAB — CBC WITH DIFFERENTIAL (CANCER CENTER ONLY)
BASO%: 0.1 % (ref 0.0–2.0)
Eosinophils Absolute: 0.3 10*3/uL (ref 0.0–0.5)
MONO#: 3.8 10*3/uL — ABNORMAL HIGH (ref 0.1–0.9)
MONO%: 1.9 % (ref 0.0–13.0)
NEUT#: 5.8 10*3/uL (ref 1.5–6.5)
Platelets: 111 10*3/uL — ABNORMAL LOW (ref 145–400)
RBC: 4.14 10*6/uL (ref 3.70–5.32)
RDW: 15.8 % — ABNORMAL HIGH (ref 11.1–15.7)
WBC: 204.3 10*3/uL (ref 3.9–10.0)

## 2012-09-14 LAB — LACTATE DEHYDROGENASE: LDH: 141 U/L (ref 94–250)

## 2012-09-14 LAB — IRON AND TIBC
Iron: 111 ug/dL (ref 42–145)
UIBC: 179 ug/dL (ref 125–400)

## 2012-09-14 LAB — CHCC SATELLITE - SMEAR

## 2012-09-14 LAB — TECHNOLOGIST REVIEW CHCC SATELLITE

## 2012-09-14 LAB — FERRITIN: Ferritin: 539 ng/mL — ABNORMAL HIGH (ref 10–291)

## 2012-09-14 NOTE — Progress Notes (Signed)
Diagnosis: #1 Stage C., chronic lymphocytic leukemia. #2 Intermittent iron deficiency.  Current therapy: #1.  [name] has been deferred by patient's family #2.  IV iron as needed.  Last dose of IV iron was given on 06/09/2012.  Interim history: Stefanie Henry   Presents today for an office followup visit.  Her son accompanies her.  She has not yet started her treatment with Treanda.  Her family was extremely concerned that she will become very sick from the [place].  We informed them that we will decrease her Treanda by 60% of the standard dose.  She has had profound anemia.  We did have to infuse her with IV iron, as well as 2 units of packed red blood cells.  I informed her family that her anemia, most likely will not get better until we start controlling her white count with treatment.  Unfortunately, they have still declined treatment.  Her white count today is 204. Her hemoglobin is 9.1  Her platelets are 111,000.  I again discussed starting treatment with Treanda, however, the son would like to defer it for the time being.  We will have to continue to monitor her counts.  Her fatigue seems to be increasing.  She does have some dyspnea on exertion.  Her appetite is fair.  She denies any nausea, vomiting, diarrhea, constipation, chest pain, shortness of breath, or cough.  She denies any fevers, chills, or night sweats, any, headaches, visual changes, or rashes.  She denies any lower leg swelling.  She denies any abdominal pain.  She remains on allopurinol and Famvir.  Review of Systems: Constitutional:Negative for malaise/fatigue, fever, chills, weight loss, diaphoresis, activity change, appetite change, and unexpected weight change.  HEENT: Negative for double vision, blurred vision, visual loss, ear pain, tinnitus, congestion, rhinorrhea, epistaxis sore throat or sinus disease, oral pain/lesion, tongue soreness Respiratory: Negative for cough, chest tightness, shortness of breath, wheezing and  stridor.  Cardiovascular: Negative for chest pain, palpitations, leg swelling, orthopnea, PND, DOE or claudication Gastrointestinal: Negative for nausea, vomiting, abdominal pain, diarrhea, constipation, blood in stool, melena, hematochezia, abdominal distention, anal bleeding, rectal pain, anorexia and hematemesis.  Genitourinary: Negative for dysuria, frequency, hematuria,  Musculoskeletal: Negative for myalgias, back pain, joint swelling, arthralgias and gait problem.  Skin: Negative for rash, color change, pallor and wound.  Neurological:. Negative for dizziness/light-headedness, tremors, seizures, syncope, facial asymmetry, speech difficulty, weakness, numbness, headaches and paresthesias.  Hematological: Negative for adenopathy. Does not bruise/bleed easily.  Psychiatric/Behavioral:  Negative for depression, no loss of interest in normal activity or change in sleep pattern.   Physical Exam: This is an 79 year old, an elderly, [place] female, in no obvious distress Vitals: Temperature 97.9 degrees, pulse 79, respirations 16, blood pressure 96/46, weight 114 pounds HEENT reveals a normocephalic, atraumatic skull, no scleral icterus, no oral lesions  Neck is supple without any cervical or supraclavicular adenopathy.  Lungs are clear to auscultation bilaterally. There are no wheezes, rales or rhonci Cardiac is regular rate and rhythm with a normal S1 and S2. There are no murmurs, rubs, or bruits.  Abdomen is soft with good bowel sounds, there is no palpable mass. There is no palpable hepatosplenomegaly. There is no palpable fluid wave.  Musculoskeletal no tenderness of the spine, ribs, or hips.  Extremities there are no clubbing, cyanosis, or edema.  Skin no petechia, purpura or ecchymosis Neurologic is nonfocal.  Laboratory Data: White count 204.3, hemoglobin 9.1, hematocrit 30.1, platelets 111,000  Current Outpatient Prescriptions on File Prior to Visit  Medication Sig Dispense Refill   . albuterol (PROVENTIL HFA;VENTOLIN HFA) 108 (90 BASE) MCG/ACT inhaler Inhale 1-2 puffs into the lungs every 6 (six) hours as needed for wheezing.  1 Inhaler  0  . albuterol (PROVENTIL) (5 MG/ML) 0.5% nebulizer solution Take 2.5 mg by nebulization every 6 (six) hours as needed.      . gabapentin (NEURONTIN) 100 MG capsule Take 100 mg by mouth as needed.      Marland Kitchen allopurinol (ZYLOPRIM) 100 MG tablet Take 2 tablets (200 mg total) by mouth daily.  60 tablet  0  . famciclovir (FAMVIR) 250 MG tablet Take 1 tablet (250 mg total) by mouth 2 (two) times daily.  30 tablet  6  . prochlorperazine (COMPAZINE) 5 MG tablet Take 1 tablet (5 mg total) by mouth every 6 (six) hours as needed. For nausea and vomiting  30 tablet  2   No current facility-administered medications on file prior to visit.   Assessment/Plan: This is a pleasant, 79 year old, [place] female, with the following issues:  #1 stage C., chronic lymphocytic leukemia.  She was treated in the past.  She had one dose of Chlorambucil.  Her white count is continuing to rise.  I again discussed.  The importance of treatment.  The son, again, wants to defer treatment with Treanda for the time being.  I suspect, that we will continue to have problems with anemia and thrombocytopenia.  We will continue to monitor her counts closely.  #2.  Anemia.  This is secondary to #1.  She did receive IV iron, and 2 units of packed red blood cells.  We will have to continue to monitor her  blood counts and provide supportive transfusions as needed.  #3.  Zoster prophylaxis.  She remains on Famvir.  #4.  Gout prophylaxis.  She is on allopurinol.   #5.  Followup.  We will follow back up with her in one month, but before then should there be questions or concerns.

## 2012-09-27 ENCOUNTER — Telehealth: Payer: Self-pay | Admitting: Hematology & Oncology

## 2012-09-27 NOTE — Telephone Encounter (Signed)
Patient's family member called and cx 09/29/12 apt due to husband being in the hospital

## 2012-09-29 ENCOUNTER — Other Ambulatory Visit: Payer: Medicare Other | Admitting: Lab

## 2012-10-17 ENCOUNTER — Ambulatory Visit: Payer: Medicare Other | Admitting: Hematology & Oncology

## 2012-10-17 ENCOUNTER — Other Ambulatory Visit: Payer: Medicare Other | Admitting: Lab

## 2012-12-05 ENCOUNTER — Telehealth: Payer: Self-pay | Admitting: Hematology & Oncology

## 2012-12-05 NOTE — Telephone Encounter (Signed)
Son called scheduled 6-12 appointment. Per MD scheduled with PA, Son aware

## 2012-12-08 ENCOUNTER — Ambulatory Visit (HOSPITAL_BASED_OUTPATIENT_CLINIC_OR_DEPARTMENT_OTHER): Payer: Medicare Other | Admitting: Medical

## 2012-12-08 ENCOUNTER — Other Ambulatory Visit (HOSPITAL_BASED_OUTPATIENT_CLINIC_OR_DEPARTMENT_OTHER): Payer: Medicare Other | Admitting: Lab

## 2012-12-08 VITALS — BP 98/45 | HR 56 | Temp 98.4°F | Resp 16 | Ht <= 58 in | Wt 112.0 lb

## 2012-12-08 DIAGNOSIS — C9110 Chronic lymphocytic leukemia of B-cell type not having achieved remission: Secondary | ICD-10-CM

## 2012-12-08 DIAGNOSIS — D509 Iron deficiency anemia, unspecified: Secondary | ICD-10-CM

## 2012-12-08 LAB — CBC WITH DIFFERENTIAL (CANCER CENTER ONLY)
BASO#: 0.2 10*3/uL (ref 0.0–0.2)
BASO%: 0.1 % (ref 0.0–2.0)
EOS%: 0.1 % (ref 0.0–7.0)
HCT: 27.2 % — ABNORMAL LOW (ref 34.8–46.6)
HGB: 8.3 g/dL — ABNORMAL LOW (ref 11.6–15.9)
LYMPH#: 186.2 10*3/uL — ABNORMAL HIGH (ref 0.9–3.3)
LYMPH%: 94.2 % — ABNORMAL HIGH (ref 14.0–48.0)
MCHC: 30.5 g/dL — ABNORMAL LOW (ref 32.0–36.0)
MCV: 73 fL — ABNORMAL LOW (ref 81–101)
NEUT%: 3.2 % — ABNORMAL LOW (ref 39.6–80.0)
RDW: 14 % (ref 11.1–15.7)

## 2012-12-08 LAB — LACTATE DEHYDROGENASE: LDH: 100 U/L (ref 94–250)

## 2012-12-08 LAB — MORPHOLOGY - CHCC SATELLITE: PLT EST ~~LOC~~: DECREASED

## 2012-12-08 LAB — COMPREHENSIVE METABOLIC PANEL
AST: 11 U/L (ref 0–37)
Alkaline Phosphatase: 101 U/L (ref 39–117)
BUN: 9 mg/dL (ref 6–23)
Glucose, Bld: 136 mg/dL — ABNORMAL HIGH (ref 70–99)
Sodium: 132 mEq/L — ABNORMAL LOW (ref 135–145)
Total Bilirubin: 0.3 mg/dL (ref 0.3–1.2)
Total Protein: 5.2 g/dL — ABNORMAL LOW (ref 6.0–8.3)

## 2012-12-08 LAB — IRON AND TIBC
%SAT: 18 % — ABNORMAL LOW (ref 20–55)
Iron: 47 ug/dL (ref 42–145)
TIBC: 267 ug/dL (ref 250–470)
UIBC: 220 ug/dL (ref 125–400)

## 2012-12-08 NOTE — Progress Notes (Signed)
Diagnosis: #1 Stage C., chronic lymphocytic leukemia. #2 Intermittent iron deficiency.  Current therapy: #1.  [name] has been deferred by patient's family #2.  IV iron as needed.  Last dose of IV iron was given on 06/09/2012.  Interim history: Stefanie Henry   Presents today for an office followup visit.  Her son accompanies her.  She has not yet started her treatment with Treanda.  Her family was extremely concerned that she will become very sick from the [place].  We informed them that we will decrease her Treanda by 60% of the standard dose.  She has had profound anemia.  She now has abdominal pain and early satiety.  She also is complaining of shortness of breath.  This is all stemming from her CLL in fact that she has not been treated.  It was reiterated that he had by myself and Dr. [name] that she will continue to decline especially without treatment, and will most likely not make it until next year.  She is progressively getting worse in the leukemia is taking over her bone marrow.  The son asked if there is any alternative treatment for her, and we explained that the only treatment is with Treanda and NO alternatives.  This would like and ultrasounds of her abdomen before making any decision regarding treatment.  We also explained that her anemia will continue to get worse.  Her organs will continue to have a difficult time secondary to the anemia.  In the past, we  have had to transfuse her with IV iron, as well as 2 units of packed red blood cells.  We continue to check her iron studies.  Her last iron panel back in March revealed a ferritin of 539, iron 111 with 38% saturation. I informed her family that her anemia, most likely will not get better until we start controlling her white count with treatment.   Her white count today is 197. Her hemoglobin is 8.3  Her platelets are 95,000.    We will have to continue to monitor her counts.  Her fatigue seems to be increasing.  She does have some  dyspnea on exertion.  Her appetite is fair.  She denies any nausea, vomiting, diarrhea, constipation, chest pain or cough.  She denies any fevers, chills, or night sweats, any, headaches, visual changes, or rashes.  She denies any lower leg swelling.  She denies any abdominal pain.  She remains on allopurinol and Famvir.  The spine would like [place] to go ahead and get an ultrasound of her abdomen for her today and then call him with the results.  We will then go from there regarding treatment.  Review of Systems: Constitutional:Negative for malaise/fatigue, fever, chills, weight loss, diaphoresis, activity change, appetite change, and unexpected weight change.  HEENT: Negative for double vision, blurred vision, visual loss, ear pain, tinnitus, congestion, rhinorrhea, epistaxis sore throat or sinus disease, oral pain/lesion, tongue soreness Respiratory: Negative for cough, chest tightness, shortness of breath, wheezing and stridor.  Cardiovascular: Negative for chest pain, palpitations, leg swelling, orthopnea, PND, DOE or claudication Gastrointestinal: Negative for nausea, vomiting, abdominal pain, diarrhea, constipation, blood in stool, melena, hematochezia, abdominal distention, anal bleeding, rectal pain, anorexia and hematemesis.  Genitourinary: Negative for dysuria, frequency, hematuria,  Musculoskeletal: Negative for myalgias, back pain, joint swelling, arthralgias and gait problem.  Skin: Negative for rash, color change, pallor and wound.  Neurological:. Negative for dizziness/light-headedness, tremors, seizures, syncope, facial asymmetry, speech difficulty, weakness, numbness, headaches and paresthesias.  Hematological: Negative for  adenopathy. Does not bruise/bleed easily.  Psychiatric/Behavioral:  Negative for depression, no loss of interest in normal activity or change in sleep pattern.   Physical Exam: This is an 79 year old, 79 year old, Stefanie Henry, in no obvious distress Vitals:  Temperature 98.4 degrees pulse 56 respirations 16 blood pressure 98/45 weight 104 pounds HEENT reveals a normocephalic, atraumatic skull, no scleral icterus, no oral lesions  Neck is supple without any cervical or supraclavicular adenopathy.  Lungs are clear to auscultation bilaterally. There are no wheezes, rales or rhonci Cardiac is regular rate and rhythm with a normal S1 and S2. There are no murmurs, rubs, or bruits.  Abdomen is soft with good bowel sounds, there is no palpable mass. There is no palpable hepatosplenomegaly. There is no palpable fluid wave.  Musculoskeletal no tenderness of the spine, ribs, or hips.  Extremities there are no clubbing, cyanosis, or edema.  Skin no petechia, purpura or ecchymosis Neurologic is nonfocal.  Laboratory Data: White count 197.6 hemoglobin 8.3 hematocrit 27.2 platelets 95,000  Current Outpatient Prescriptions on File Prior to Visit  Medication Sig Dispense Refill  . albuterol (PROVENTIL HFA;VENTOLIN HFA) 108 (90 BASE) MCG/ACT inhaler Inhale 1-2 puffs into the lungs every 6 (six) hours as needed for wheezing.  1 Inhaler  0  . albuterol (PROVENTIL) (5 MG/ML) 0.5% nebulizer solution Take 2.5 mg by nebulization every 6 (six) hours as needed.      . gabapentin (NEURONTIN) 100 MG capsule Take 100 mg by mouth as needed.      Marland Kitchen allopurinol (ZYLOPRIM) 100 MG tablet Take 2 tablets (200 mg total) by mouth daily.  60 tablet  0  . famciclovir (FAMVIR) 250 MG tablet Take 1 tablet (250 mg total) by mouth 2 (two) times daily.  30 tablet  6  . prochlorperazine (COMPAZINE) 5 MG tablet Take 1 tablet (5 mg total) by mouth every 6 (six) hours as needed. For nausea and vomiting  30 tablet  2   No current facility-administered medications on file prior to visit.   Assessment/Plan: This is a pleasant, 79 year old, Stefanie Henry, with the following issues:  #1 stage C., chronic lymphocytic leukemia.  She was treated in the past.  She had one dose of Chlorambucil.  Her  white count is continuing to rise.  Dr. [name] again discussed the importance of treatment.  The son, again, wants to defer treatment until we get a complete abdominal ultrasound.  We will go ahead and get that set up for today.  The son does understand that if we do not start treatment soon, that she will continue to decline and it would be doubtful that she will make it until next year.  She is quite symptomatic.  She does have splenomegaly.  She may possibly have some enlarged lymph nodes.  This is making it difficult for her to the.  This is causing shortness of breath.  The bottom line is that she needs treatment.  #2.  Abdominal pain.  She does have splenomegaly.  This is most likely stemming from CLL.  Again she needs treatment.  We will go ahead and set her up on abdominal ultrasound.  #3.  Anemia.  This is secondary to #1.  She has received IV iron, and 2 units of packed red blood cells in the past.   We will have to continue to monitor her  blood counts and provide supportive transfusions as needed.  #4.  Zoster prophylaxis.  She remains on Famvir.  He  #5.  Gout  prophylaxis.  She is on allopurinol.   #6.  Followup.  We will call the son with the results of abdominal ultrasound.  Hopefully he will decide to go with treatment for his mother.  If he decides to go with Treanda we will get that set up for next week.  If he decides against her treatment, we will follow back up with her in one month, but before then should there be questions or concerns.

## 2012-12-09 ENCOUNTER — Other Ambulatory Visit: Payer: Self-pay | Admitting: Hematology & Oncology

## 2012-12-09 ENCOUNTER — Telehealth: Payer: Self-pay | Admitting: Hematology & Oncology

## 2012-12-09 ENCOUNTER — Ambulatory Visit (HOSPITAL_BASED_OUTPATIENT_CLINIC_OR_DEPARTMENT_OTHER)
Admission: RE | Admit: 2012-12-09 | Discharge: 2012-12-09 | Disposition: A | Discharge status: Home / Self Care | Payer: Medicare Other | Source: Ambulatory Visit | Attending: Medical | Admitting: Medical

## 2012-12-09 DIAGNOSIS — R1012 Left upper quadrant pain: Secondary | ICD-10-CM | POA: Insufficient documentation

## 2012-12-09 DIAGNOSIS — C9110 Chronic lymphocytic leukemia of B-cell type not having achieved remission: Secondary | ICD-10-CM

## 2012-12-09 DIAGNOSIS — K8020 Calculus of gallbladder without cholecystitis without obstruction: Secondary | ICD-10-CM | POA: Insufficient documentation

## 2012-12-09 DIAGNOSIS — R161 Splenomegaly, not elsewhere classified: Secondary | ICD-10-CM | POA: Insufficient documentation

## 2012-12-09 NOTE — Telephone Encounter (Signed)
I left a message for her son. I told him that her ultrasound showed her spleen to be quite large. This is the cause for her abdominal pain and the fact that she cannot eat much. There is compression upon her stomach by her large spleen.  I told him that the only way to help this is with treatment for the CLL. The only way to treat the CLL is with chemotherapy. Again, we can adjust the dose of chemotherapy so she just does not get that sick with it, if at all.  I told him to call us back to let us know what he wants to do. If he wants her to have treatment, then we will arrange this to start next week.  If he is not want her to have treatment, then I would favor getting hospice involved to try to help manage her at issues.  I told him to give [place] a call to let us know what we need to do treatment wise.  Cindee Lame

## 2012-12-13 ENCOUNTER — Telehealth: Payer: Self-pay | Admitting: Hematology & Oncology

## 2012-12-13 ENCOUNTER — Other Ambulatory Visit: Payer: Self-pay | Admitting: Hematology & Oncology

## 2012-12-13 DIAGNOSIS — C9110 Chronic lymphocytic leukemia of B-cell type not having achieved remission: Secondary | ICD-10-CM

## 2012-12-13 MED ORDER — ALLOPURINOL 100 MG PO TABS: 100.0000 mg | ORAL_TABLET | Freq: Every day | ORAL | Status: DC

## 2012-12-13 MED ORDER — FAMCICLOVIR 250 MG PO TABS: ORAL_TABLET | ORAL | Status: DC

## 2012-12-13 NOTE — Telephone Encounter (Signed)
Per son pt wants to come next week instead of 6-18. Dr. [name] aware

## 2012-12-21 ENCOUNTER — Ambulatory Visit (HOSPITAL_BASED_OUTPATIENT_CLINIC_OR_DEPARTMENT_OTHER): Payer: Medicare Other

## 2012-12-21 ENCOUNTER — Other Ambulatory Visit: Payer: Self-pay | Admitting: *Deleted

## 2012-12-21 ENCOUNTER — Encounter (HOSPITAL_COMMUNITY)
Admission: RE | Admit: 2012-12-21 | Discharge: 2012-12-21 | Disposition: A | Discharge status: Home / Self Care | Payer: Medicare Other | Source: Ambulatory Visit | Attending: Hematology & Oncology | Admitting: Hematology & Oncology

## 2012-12-21 ENCOUNTER — Other Ambulatory Visit (HOSPITAL_BASED_OUTPATIENT_CLINIC_OR_DEPARTMENT_OTHER): Payer: Medicare Other | Admitting: Lab

## 2012-12-21 VITALS — BP 100/53 | HR 82

## 2012-12-21 DIAGNOSIS — Z5111 Encounter for antineoplastic chemotherapy: Secondary | ICD-10-CM

## 2012-12-21 DIAGNOSIS — D509 Iron deficiency anemia, unspecified: Secondary | ICD-10-CM

## 2012-12-21 DIAGNOSIS — C9112 Chronic lymphocytic leukemia of B-cell type in relapse: Secondary | ICD-10-CM

## 2012-12-21 DIAGNOSIS — C9110 Chronic lymphocytic leukemia of B-cell type not having achieved remission: Secondary | ICD-10-CM

## 2012-12-21 LAB — CBC WITH DIFFERENTIAL (CANCER CENTER ONLY)
BASO%: 0.1 % (ref 0.0–2.0)
EOS%: 0.1 % (ref 0.0–7.0)
HCT: 26.1 % — ABNORMAL LOW (ref 34.8–46.6)
LYMPH%: 94.8 % — ABNORMAL HIGH (ref 14.0–48.0)
MCH: 23 pg — ABNORMAL LOW (ref 26.0–34.0)
MCHC: 31.4 g/dL — ABNORMAL LOW (ref 32.0–36.0)
MCV: 73 fL — ABNORMAL LOW (ref 81–101)
MONO#: 5.7 10*3/uL — ABNORMAL HIGH (ref 0.1–0.9)
MONO%: 2.7 % (ref 0.0–13.0)
NEUT%: 2.3 % — ABNORMAL LOW (ref 39.6–80.0)
Platelets: 145 10*3/uL (ref 145–400)
RDW: 14.3 % (ref 11.1–15.7)
WBC: 211.6 10*3/uL (ref 3.9–10.0)

## 2012-12-21 LAB — TECHNOLOGIST REVIEW CHCC SATELLITE

## 2012-12-21 MED ORDER — PROCHLORPERAZINE MALEATE 10 MG PO TABS: 10.0000 mg | ORAL_TABLET | Freq: Four times a day (QID) | ORAL | Status: DC | PRN

## 2012-12-21 MED ORDER — PREDNISONE 10 MG PO TABS: 10.0000 mg | ORAL_TABLET | Freq: Every day | ORAL | Status: DC

## 2012-12-21 MED ORDER — DEXAMETHASONE SODIUM PHOSPHATE 10 MG/ML IJ SOLN
10.0000 mg | Freq: Once | INTRAMUSCULAR | Status: AC
  Administered 2012-12-21: 10 mg via INTRAVENOUS

## 2012-12-21 MED ORDER — SODIUM CHLORIDE 0.9 % IV SOLN
Freq: Once | INTRAVENOUS | Status: AC
  Administered 2012-12-21: 12:00:00 via INTRAVENOUS

## 2012-12-21 MED ORDER — ONDANSETRON 8 MG/50ML IVPB (CHCC)
8.0000 mg | Freq: Once | INTRAVENOUS | Status: AC
  Administered 2012-12-21: 8 mg via INTRAVENOUS

## 2012-12-21 MED ORDER — SODIUM CHLORIDE 0.9 % IV SOLN
60.0000 mg/m2 | Freq: Once | INTRAVENOUS | Status: AC
  Administered 2012-12-21: 85 mg via INTRAVENOUS
  Filled 2012-12-21: qty 17

## 2012-12-21 NOTE — Patient Instructions (Addendum)
Cancer Center Discharge Instructions for Patients Receiving Chemotherapy  Today you received the following chemotherapy agents Treanda  To help prevent nausea and vomiting after your treatment, we encourage you to take your nausea medication: Take home nausea medicine  Compazine 10 mg by mouth every 6 hours as needed for nausea or vomiting.   If you develop nausea and vomiting that is not controlled by your nausea medication, call the clinic.   BELOW ARE SYMPTOMS THAT SHOULD BE REPORTED IMMEDIATELY:  *FEVER GREATER THAN 100.5 F  *CHILLS WITH OR WITHOUT FEVER  NAUSEA AND VOMITING THAT IS NOT CONTROLLED WITH YOUR NAUSEA MEDICATION  *UNUSUAL SHORTNESS OF BREATH  *UNUSUAL BRUISING OR BLEEDING  TENDERNESS IN MOUTH AND THROAT WITH OR WITHOUT PRESENCE OF ULCERS  *URINARY PROBLEMS  *BOWEL PROBLEMS  UNUSUAL RASH Items with * indicate a potential emergency and should be followed up as soon as possible.  Feel free to call the clinic you have any questions or concerns. The clinic phone number is [phone].   Bendamustine Injection What is this medicine? BENDAMUSTINE (BEN da MUS teen) is an anti-cancer drug used to treat a certain type of leukemia. This medicine may be used for other purposes; ask your health care provider or pharmacist if you have questions. What should I tell my health care provider before I take this medicine? They need to know if you have any of these conditions: -kidney disease -liver disease -an unusual or allergic reaction to bendamustine, mannitol, other medicines, foods, dyes, or preservatives -pregnant or trying to get pregnant -breast-feeding How should I use this medicine? This medicine is for infusion into a vein. It is given by a health care professional in a hospital or clinic setting. Talk to your pediatrician regarding the use of this medicine in children. Special care may be needed. Overdosage: If you think you have taken too  much of this medicine contact a poison control center or emergency room at once. NOTE: This medicine is only for you. Do not share this medicine with others. What if I miss a dose? It is important not to miss your dose. Call your doctor or health care professional if you are unable to keep an appointment. What may interact with this medicine? Do not take this medicine with any of the following medications: -clozapine This medicine may also interact with the following medications: -atazanavir -cimetidine -ciprofloxacin -enoxacin -fluvoxamine -medicines for seizures like carbamazepine and phenobarbital -mexiletine -rifampin -tacrine -thiabendazole -zileuton This list may not describe all possible interactions. Give your health care provider a list of all the medicines, herbs, non-prescription drugs, or dietary supplements you use. Also tell them if you smoke, drink alcohol, or use illegal drugs. Some items may interact with your medicine. What should I watch for while using this medicine? Your condition will be monitored carefully while you are receiving this medicine. This drug may make you feel generally unwell. This is not uncommon, as chemotherapy can affect healthy cells as well as cancer cells. Report any side effects. Continue your course of treatment even though you feel ill unless your doctor tells you to stop. Call your doctor or health care professional for advice if you get a fever, chills or sore throat, or other symptoms of a cold or flu. Do not treat yourself. This drug decreases your body's ability to fight infections. Try to avoid being around people who are sick. This medicine may increase your risk to bruise or bleed. Call your doctor or health care professional  if you notice any unusual bleeding. Be careful brushing and flossing your teeth or using a toothpick because you may get an infection or bleed more easily. If you have any dental work done, tell your dentist you are  receiving this medicine. Avoid taking products that contain aspirin, acetaminophen, ibuprofen, naproxen, or ketoprofen unless instructed by your doctor. These medicines may hide a fever. Do not become pregnant while taking this medicine. Women should inform their doctor if they wish to become pregnant or think they might be pregnant. There is a potential for serious side effects to an unborn child. Men should inform their doctors if they wish to father a child. This medicine may lower sperm counts. Talk to your health care professional or pharmacist for more information. Do not breast-feed an infant while taking this medicine. What side effects may I notice from receiving this medicine? Side effects that you should report to your doctor or health care professional as soon as possible: -allergic reactions like skin rash, itching or hives, swelling of the face, lips, or tongue -low blood counts - this medicine may decrease the number of white blood cells, red blood cells and platelets. You may be at increased risk for infections and bleeding. -signs of infection - fever or chills, cough, sore throat, pain or difficulty passing urine -signs of decreased platelets or bleeding - bruising, pinpoint red spots on the skin, black, tarry stools, blood in the urine -signs of decreased red blood cells - unusually weak or tired, fainting spells, lightheadedness -trouble passing urine or change in the amount of urine Side effects that usually do not require medical attention (report to your doctor or health care professional if they continue or are bothersome): -diarrhea This list may not describe all possible side effects. Call your doctor for medical advice about side effects. You may report side effects to FDA at 1-800-FDA-1088. Where should I keep my medicine? This drug is given in a hospital or clinic and will not be stored at home. NOTE: This sheet is a summary. It may not cover all possible information. If  you have questions about this medicine, talk to your doctor, pharmacist, or health care provider.  2012, [employer]vier/Gold Standard. (09/19/2007 3:58:27 PM)

## 2012-12-22 ENCOUNTER — Ambulatory Visit (HOSPITAL_BASED_OUTPATIENT_CLINIC_OR_DEPARTMENT_OTHER): Payer: Medicare Other

## 2012-12-22 ENCOUNTER — Other Ambulatory Visit: Payer: Self-pay | Admitting: Oncology

## 2012-12-22 ENCOUNTER — Encounter: Payer: Self-pay | Admitting: Hematology & Oncology

## 2012-12-22 VITALS — BP 114/50 | HR 77 | Temp 98.3°F | Resp 20

## 2012-12-22 DIAGNOSIS — C9110 Chronic lymphocytic leukemia of B-cell type not having achieved remission: Secondary | ICD-10-CM

## 2012-12-22 DIAGNOSIS — D630 Anemia in neoplastic disease: Secondary | ICD-10-CM

## 2012-12-22 DIAGNOSIS — D509 Iron deficiency anemia, unspecified: Secondary | ICD-10-CM

## 2012-12-22 DIAGNOSIS — Z5111 Encounter for antineoplastic chemotherapy: Secondary | ICD-10-CM

## 2012-12-22 MED ORDER — SODIUM CHLORIDE 0.9 % IV SOLN
Freq: Once | INTRAVENOUS | Status: AC
  Administered 2012-12-22: 10:00:00 via INTRAVENOUS

## 2012-12-22 MED ORDER — DEXAMETHASONE SODIUM PHOSPHATE 10 MG/ML IJ SOLN
10.0000 mg | Freq: Once | INTRAMUSCULAR | Status: AC
  Administered 2012-12-22: 10 mg via INTRAVENOUS

## 2012-12-22 MED ORDER — ACETAMINOPHEN 325 MG PO TABS
650.0000 mg | ORAL_TABLET | Freq: Once | ORAL | Status: AC
  Administered 2012-12-22: 650 mg via ORAL

## 2012-12-22 MED ORDER — TRIAMTERENE-HCTZ 37.5-25 MG PO TABS: ORAL_TABLET | ORAL | Status: DC

## 2012-12-22 MED ORDER — FUROSEMIDE 10 MG/ML IJ SOLN
20.0000 mg | Freq: Once | INTRAMUSCULAR | Status: AC
  Administered 2012-12-22: 10 mg via INTRAVENOUS

## 2012-12-22 MED ORDER — ONDANSETRON 8 MG/50ML IVPB (CHCC)
8.0000 mg | Freq: Once | INTRAVENOUS | Status: AC
  Administered 2012-12-22: 8 mg via INTRAVENOUS

## 2012-12-22 MED ORDER — SODIUM CHLORIDE 0.9 % IV SOLN
60.0000 mg/m2 | Freq: Once | INTRAVENOUS | Status: AC
  Administered 2012-12-22: 85 mg via INTRAVENOUS
  Filled 2012-12-22: qty 17

## 2012-12-22 MED ORDER — DIPHENHYDRAMINE HCL 25 MG PO CAPS
25.0000 mg | ORAL_CAPSULE | Freq: Once | ORAL | Status: AC
  Administered 2012-12-22: 25 mg via ORAL

## 2012-12-22 NOTE — Patient Instructions (Addendum)
Bendamustine Injection What is this medicine? BENDAMUSTINE (BEN da MUS teen) is a chemotherapy drug. It is used to treat chronic lymphocytic leukemia and non-Hodgkin lymphoma. This medicine may be used for other purposes; ask your health care provider or pharmacist if you have questions. What should I tell my health care provider before I take this medicine? They need to know if you have any of these conditions: -kidney disease -liver disease -an unusual or allergic reaction to bendamustine, mannitol, other medicines, foods, dyes, or preservatives -pregnant or trying to get pregnant -breast-feeding How should I use this medicine? This medicine is for infusion into a vein. It is given by a health care professional in a hospital or clinic setting. Talk to your pediatrician regarding the use of this medicine in children. Special care may be needed. Overdosage: If you think you have taken too much of this medicine contact a poison control center or emergency room at once. NOTE: This medicine is only for you. Do not share this medicine with others. What if I miss a dose? It is important not to miss your dose. Call your doctor or health care professional if you are unable to keep an appointment. What may interact with this medicine? Do not take this medicine with any of the following medications: -clozapine This medicine may also interact with the following medications: -atazanavir -cimetidine -ciprofloxacin -enoxacin -fluvoxamine -medicines for seizures like carbamazepine and phenobarbital -mexiletine -rifampin -tacrine -thiabendazole -zileuton This list may not describe all possible interactions. Give your health care provider a list of all the medicines, herbs, non-prescription drugs, or dietary supplements you use. Also tell them if you smoke, drink alcohol, or use illegal drugs. Some items may interact with your medicine. What should I watch for while using this medicine? Your  condition will be monitored carefully while you are receiving this medicine. This drug may make you feel generally unwell. This is not uncommon, as chemotherapy can affect healthy cells as well as cancer cells. Report any side effects. Continue your course of treatment even though you feel ill unless your doctor tells you to stop. Call your doctor or health care professional for advice if you get a fever, chills or sore throat, or other symptoms of a cold or flu. Do not treat yourself. This drug decreases your body's ability to fight infections. Try to avoid being around people who are sick. This medicine may increase your risk to bruise or bleed. Call your doctor or health care professional if you notice any unusual bleeding. Be careful brushing and flossing your teeth or using a toothpick because you may get an infection or bleed more easily. If you have any dental work done, tell your dentist you are receiving this medicine. Avoid taking products that contain aspirin, acetaminophen, ibuprofen, naproxen, or ketoprofen unless instructed by your doctor. These medicines may hide a fever. Do not become pregnant while taking this medicine. Women should inform their doctor if they wish to become pregnant or think they might be pregnant. There is a potential for serious side effects to an unborn child. Men should inform their doctors if they wish to father a child. This medicine may lower sperm counts. Talk to your health care professional or pharmacist for more information. Do not breast-feed an infant while taking this medicine. What side effects may I notice from receiving this medicine? Side effects that you should report to your doctor or health care professional as soon as possible: -allergic reactions like skin rash, itching or hives, swelling  of the face, lips, or tongue -low blood counts - this medicine may decrease the number of white blood cells, red blood cells and platelets. You may be at increased  risk for infections and bleeding. -signs of infection - fever or chills, cough, sore throat, pain or difficulty passing urine -signs of decreased platelets or bleeding - bruising, pinpoint red spots on the skin, black, tarry stools, blood in the urine -signs of decreased red blood cells - unusually weak or tired, fainting spells, lightheadedness -trouble passing urine or change in the amount of urine Side effects that usually do not require medical attention (report to your doctor or health care professional if they continue or are bothersome): -diarrhea This list may not describe all possible side effects. Call your doctor for medical advice about side effects. You may report side effects to FDA at 1-800-FDA-1088. Where should I keep my medicine? This drug is given in a hospital or clinic and will not be stored at home. NOTE: This sheet is a summary. It may not cover all possible information. If you have questions about this medicine, talk to your doctor, pharmacist, or health care provider.  2013, [employer]vier/[employer] Standard. (09/11/2011 2:15:47 PM)  Blood Transfusion Information WHAT IS A BLOOD TRANSFUSION? A transfusion is the replacement of blood or some of its parts. Blood is made up of multiple cells which provide different functions.  Red blood cells carry oxygen and are used for blood loss replacement.  White blood cells fight against infection.  Platelets control bleeding.  Plasma helps clot blood.  Other blood products are available for specialized needs, such as hemophilia or other clotting disorders. BEFORE THE TRANSFUSION  Who gives blood for transfusions?   You may be able to donate blood to be used at a later date on yourself (autologous donation).  Relatives can be asked to donate blood. This is generally not any safer than if you have received blood from a stranger. The same precautions are taken to ensure safety when a relative's blood is donated.  Healthy volunteers  who are fully evaluated to make sure their blood is safe. This is blood bank blood. Transfusion therapy is the safest it has ever been in the practice of medicine. Before blood is taken from a donor, a complete history is taken to make sure that person has no history of diseases nor engages in risky social behavior (examples are intravenous drug use or sexual activity with multiple partners). The donor's travel history is screened to minimize risk of transmitting infections, such as malaria. The donated blood is tested for signs of infectious diseases, such as HIV and hepatitis. The blood is then tested to be sure it is compatible with you in order to minimize the chance of a transfusion reaction. If you or a relative donates blood, this is often done in anticipation of surgery and is not appropriate for emergency situations. It takes many days to process the donated blood. RISKS AND COMPLICATIONS Although transfusion therapy is very safe and saves many lives, the main dangers of transfusion include:   Getting an infectious disease.  Developing a transfusion reaction. This is an allergic reaction to something in the blood you were given. Every precaution is taken to prevent this. The decision to have a blood transfusion has been considered carefully by your caregiver before blood is given. Blood is not given unless the benefits outweigh the risks. AFTER THE TRANSFUSION  Right after receiving a blood transfusion, you will usually feel much better and  more energetic. This is especially true if your red blood cells have gotten low (anemic). The transfusion raises the level of the red blood cells which carry oxygen, and this usually causes an energy increase.  The nurse administering the transfusion will monitor you carefully for complications. HOME CARE INSTRUCTIONS  No special instructions are needed after a transfusion. You may find your energy is better. Speak with your caregiver about any limitations  on activity for underlying diseases you may have. SEEK MEDICAL CARE IF:   Your condition is not improving after your transfusion.  You develop redness or irritation at the intravenous (IV) site. SEEK IMMEDIATE MEDICAL CARE IF:  Any of the following symptoms occur over the next 12 hours:  Shaking chills.  You have a temperature by mouth above 102 F (38.9 C), not controlled by medicine.  Chest, back, or muscle pain.  People around you feel you are not acting correctly or are confused.  Shortness of breath or difficulty breathing.  Dizziness and fainting.  You get a rash or develop hives.  You have a decrease in urine output.  Your urine turns a dark color or changes to pink, red, or brown. Any of the following symptoms occur over the next 10 days:  You have a temperature by mouth above 102 F (38.9 C), not controlled by medicine.  Shortness of breath.  Weakness after normal activity.  The white part of the eye turns yellow (jaundice).  You have a decrease in the amount of urine or are urinating less often.  Your urine turns a dark color or changes to pink, red, or brown. Document Released: 06/12/2000 Document Revised: 09/07/2011 Document Reviewed: 01/30/2008 [facility] Patient Information 2014 [place], [place].

## 2012-12-23 ENCOUNTER — Encounter: Payer: Self-pay | Admitting: Hematology & Oncology

## 2012-12-23 LAB — TYPE AND SCREEN
ABO/RH(D): A POS
Antibody Screen: NEGATIVE
Unit division: 0
Unit division: 0

## 2012-12-27 ENCOUNTER — Ambulatory Visit (HOSPITAL_BASED_OUTPATIENT_CLINIC_OR_DEPARTMENT_OTHER)
Admission: RE | Admit: 2012-12-27 | Discharge: 2012-12-27 | Disposition: A | Discharge status: Home / Self Care | Payer: Medicare Other | Source: Ambulatory Visit | Attending: Family Medicine | Admitting: Family Medicine

## 2012-12-27 ENCOUNTER — Other Ambulatory Visit (HOSPITAL_BASED_OUTPATIENT_CLINIC_OR_DEPARTMENT_OTHER): Payer: Self-pay | Admitting: Family Medicine

## 2012-12-27 DIAGNOSIS — R51 Headache: Secondary | ICD-10-CM

## 2012-12-27 DIAGNOSIS — C9110 Chronic lymphocytic leukemia of B-cell type not having achieved remission: Secondary | ICD-10-CM | POA: Insufficient documentation

## 2012-12-27 DIAGNOSIS — D164 Benign neoplasm of bones of skull and face: Secondary | ICD-10-CM | POA: Insufficient documentation

## 2012-12-27 DIAGNOSIS — G319 Degenerative disease of nervous system, unspecified: Secondary | ICD-10-CM | POA: Insufficient documentation

## 2012-12-28 ENCOUNTER — Other Ambulatory Visit (HOSPITAL_BASED_OUTPATIENT_CLINIC_OR_DEPARTMENT_OTHER): Payer: Self-pay | Admitting: Neurology

## 2012-12-30 ENCOUNTER — Other Ambulatory Visit: Payer: Self-pay | Admitting: Hematology & Oncology

## 2012-12-30 DIAGNOSIS — C9110 Chronic lymphocytic leukemia of B-cell type not having achieved remission: Secondary | ICD-10-CM

## 2012-12-30 MED ORDER — AMOXICILLIN-POT CLAVULANATE 875-125 MG PO TABS: 1.0000 | ORAL_TABLET | Freq: Two times a day (BID) | ORAL | Status: DC

## 2012-12-30 NOTE — Progress Notes (Signed)
Patient's son called stating that she was having fever of 101.1 with some chills. She has CLL. Had chemo last week.  No cough.  No diarrhea.  No rash.  Still not eating a lot due to enlarged spleen.  Will call in Augmentin for 7 days.  I told him that if T > 102 -- she needs to go to hospital.   Cindee Lame

## 2013-01-03 ENCOUNTER — Ambulatory Visit (HOSPITAL_BASED_OUTPATIENT_CLINIC_OR_DEPARTMENT_OTHER)
Admission: RE | Admit: 2013-01-03 | Discharge: 2013-01-03 | Disposition: A | Discharge status: Home / Self Care | Payer: Medicare Other | Source: Ambulatory Visit | Attending: Neurology | Admitting: Neurology

## 2013-01-03 DIAGNOSIS — Z87898 Personal history of other specified conditions: Secondary | ICD-10-CM | POA: Insufficient documentation

## 2013-01-03 DIAGNOSIS — J3489 Other specified disorders of nose and nasal sinuses: Secondary | ICD-10-CM | POA: Insufficient documentation

## 2013-01-03 DIAGNOSIS — R51 Headache: Secondary | ICD-10-CM | POA: Insufficient documentation

## 2013-01-03 MED ORDER — GADOBENATE DIMEGLUMINE 529 MG/ML IV SOLN: 10.0000 mL | Freq: Once | INTRAVENOUS | Status: AC | PRN

## 2013-01-04 ENCOUNTER — Ambulatory Visit (HOSPITAL_BASED_OUTPATIENT_CLINIC_OR_DEPARTMENT_OTHER): Payer: Medicare Other | Admitting: Hematology & Oncology

## 2013-01-04 ENCOUNTER — Other Ambulatory Visit (HOSPITAL_BASED_OUTPATIENT_CLINIC_OR_DEPARTMENT_OTHER): Payer: Medicare Other | Admitting: Lab

## 2013-01-04 VITALS — BP 91/45 | HR 104 | Temp 98.9°F | Resp 16 | Ht <= 58 in | Wt 112.0 lb

## 2013-01-04 DIAGNOSIS — C9110 Chronic lymphocytic leukemia of B-cell type not having achieved remission: Secondary | ICD-10-CM

## 2013-01-04 DIAGNOSIS — D509 Iron deficiency anemia, unspecified: Secondary | ICD-10-CM

## 2013-01-04 DIAGNOSIS — K30 Functional dyspepsia: Secondary | ICD-10-CM

## 2013-01-04 DIAGNOSIS — K3189 Other diseases of stomach and duodenum: Secondary | ICD-10-CM

## 2013-01-04 LAB — CBC WITH DIFFERENTIAL (CANCER CENTER ONLY)
BASO%: 0.9 % (ref 0.0–2.0)
LYMPH#: 3.4 10*3/uL — ABNORMAL HIGH (ref 0.9–3.3)
LYMPH%: 58.5 % — ABNORMAL HIGH (ref 14.0–48.0)
MCV: 74 fL — ABNORMAL LOW (ref 81–101)
MONO#: 0 10*3/uL — ABNORMAL LOW (ref 0.1–0.9)
Platelets: 79 10*3/uL — ABNORMAL LOW (ref 145–400)
RDW: 16.1 % — ABNORMAL HIGH (ref 11.1–15.7)
WBC: 5.9 10*3/uL (ref 3.9–10.0)

## 2013-01-04 LAB — COMPREHENSIVE METABOLIC PANEL
ALT: 12 U/L (ref 0–35)
AST: 10 U/L (ref 0–37)
CO2: 23 mEq/L (ref 19–32)
Sodium: 132 mEq/L — ABNORMAL LOW (ref 135–145)
Total Bilirubin: 0.6 mg/dL (ref 0.3–1.2)
Total Protein: 4.8 g/dL — ABNORMAL LOW (ref 6.0–8.3)

## 2013-01-04 LAB — LACTATE DEHYDROGENASE: LDH: 118 U/L (ref 94–250)

## 2013-01-04 MED ORDER — SUCRALFATE 1 GM/10ML PO SUSP: ORAL | Status: DC

## 2013-01-04 NOTE — Progress Notes (Signed)
This office note has been dictated.

## 2013-01-05 LAB — IRON AND TIBC CHCC
%SAT: 22 % (ref 21–57)
UIBC: 187 ug/dL (ref 120–384)

## 2013-01-05 NOTE — Progress Notes (Signed)
DIAGNOSIS:  Chronic lymphocytic leukemia.  CURRENT THERAPY:  Patient is status post cycle 1 of Treanda - dose reduced.  INTERIM HISTORY:  Stefanie Henry comes in for followup.  She got her first cycle of chemotherapy a couple weeks ago.  She actually did well with this.  I cut her dose back by about 25% or so.  She is still having some abdominal problems.  I do not think the abdominal complaints are totally related to her having a big spleen.  I think she may have some air swallowing.  This can cause a abdominal bloating.  She apparently was seen in the emergency room on July 1st.  She had a CT of the head which was unremarkable.  She did have a little bit of a temperature, I think, last week.  We do have her on antibiotics with Augmentin.  This is to be taken for 7 days. She is on chronic prophylaxis with Famvir.  She has post herpetic neuralgia on the left face.  She has had a little bit of a better appetite.  She had no increase in leg swelling.  PHYSICAL EXAMINATION:  General:  This is a an elderly [place] female in no obvious distress.  Vital signs:  Show temperature of 98.9, pulse 104, respiratory rate 16, blood pressure 91/45.  Weight is 112 pounds.  Head and neck:  Shows normocephalic, atraumatic skull.  There are no ocular or oral lesions.  There are no palpable cervical or supraclavicular lymph nodes.  Lungs:  Clear bilaterally.  Cardiac:  Regular rate and rhythm with a normal S1 and S2.  There are no murmurs, rubs or bruits. Abdomen:  Soft.  She has good bowel sounds.  She has no palpable hepatomegaly.  Spleen tip is palpable below the left costal margin but not as prominent.  Extremities:  Show some trace edema in her legs.  LABORATORIES STUDIES:  White cell count 5.9, hemoglobin 9.7, hematocrit 29.7, platelet count 79,000.  White cell differential shows 39 segs, 59 lymphs.  IMPRESSION:  Stefanie Henry is an 79 year old [place] female with chronic lymphocytic leukemia.   Again, we finally got her the treatment.  Not surprising, her white cell count has come down quite nicely.  I forgot to mention that she had ultrasound done about a month ago. This was of the abdomen.  She has numerous gallstones.  It is possible the gallstones could be causing some of the abdominal problems.  I think that we should repeat her abdominal ultrasound in about 2 weeks. It will be interesting to see how her spleen looks.  I think given that her white count has responded so well, we might be able to get away with 1 additional cycle of treatment.  Again, Ms. Vancamp is somewhat frail.  We really need to consider overall performance status with respect to treating her.  I anticipate that her blood counts will continue to improve as her spleen shrinks and as her leukemia is eradicated from her bone marrow.  We will go ahead and get her set up with treatment for early August.  I think it would be a good idea to give her an extra week break so that she can continue to recover.  I did give her a prescription for Carafate elixir.  This may help with her abdominal discomfort.    ______________________________ Josph Macho, M.D. PRE/MEDQ  D:  01/04/2013  T:  01/05/2013  Job:  [id number]

## 2013-01-06 ENCOUNTER — Ambulatory Visit: Payer: Medicare Other | Admitting: Hematology & Oncology

## 2013-01-06 ENCOUNTER — Other Ambulatory Visit: Payer: Medicare Other | Admitting: Lab

## 2013-01-16 ENCOUNTER — Ambulatory Visit (HOSPITAL_BASED_OUTPATIENT_CLINIC_OR_DEPARTMENT_OTHER): Payer: Medicare Other | Admitting: Hematology & Oncology

## 2013-01-16 ENCOUNTER — Ambulatory Visit (HOSPITAL_BASED_OUTPATIENT_CLINIC_OR_DEPARTMENT_OTHER): Payer: Medicare Other | Admitting: Lab

## 2013-01-16 VITALS — BP 91/41 | HR 81 | Temp 98.3°F | Resp 16 | Ht 59.0 in | Wt 110.0 lb

## 2013-01-16 DIAGNOSIS — C9110 Chronic lymphocytic leukemia of B-cell type not having achieved remission: Secondary | ICD-10-CM

## 2013-01-16 LAB — CMP (CANCER CENTER ONLY)
ALT(SGPT): 13 U/L (ref 10–47)
AST: 15 U/L (ref 11–38)
CO2: 29 mEq/L (ref 18–33)
Calcium: 8.4 mg/dL (ref 8.0–10.3)
Chloride: 93 mEq/L — ABNORMAL LOW (ref 98–108)
Potassium: 2.7 mEq/L — CL (ref 3.3–4.7)
Sodium: 129 mEq/L (ref 128–145)
Total Protein: 5.3 g/dL — ABNORMAL LOW (ref 6.4–8.1)

## 2013-01-16 LAB — LACTATE DEHYDROGENASE: LDH: 85 U/L — ABNORMAL LOW (ref 94–250)

## 2013-01-16 LAB — CHCC SATELLITE - SMEAR

## 2013-01-16 MED ORDER — METHYLPREDNISOLONE 4 MG PO KIT: PACK | ORAL | Status: DC

## 2013-01-16 MED ORDER — POTASSIUM CHLORIDE CRYS ER 20 MEQ PO TBCR: EXTENDED_RELEASE_TABLET | ORAL | Status: DC

## 2013-01-16 NOTE — Progress Notes (Signed)
This office note has been dictated.

## 2013-01-17 LAB — CBC WITH DIFFERENTIAL (CANCER CENTER ONLY)
BASO#: 0.1 10*3/uL (ref 0.0–0.2)
Eosinophils Absolute: 0.2 10*3/uL (ref 0.0–0.5)
HGB: 9.9 g/dL — ABNORMAL LOW (ref 11.6–15.9)
LYMPH#: 7.6 10*3/uL — ABNORMAL HIGH (ref 0.9–3.3)
MCH: 23.9 pg — ABNORMAL LOW (ref 26.0–34.0)
MONO#: 0.1 10*3/uL (ref 0.1–0.9)
NEUT#: 0.8 10*3/uL — ABNORMAL LOW (ref 1.5–6.5)
RBC: 4.14 10*6/uL (ref 3.70–5.32)
WBC: 8.7 10*3/uL (ref 3.9–10.0)

## 2013-01-17 LAB — TECHNOLOGIST REVIEW CHCC SATELLITE

## 2013-01-17 NOTE — Progress Notes (Signed)
DIAGNOSIS:  Chronic lymphocytic leukemia.  CURRENT THERAPY:  Patient is status post cycle 1 of Treanda-dose reduced.  INTERIM HISTORY:  Stefanie Henry comes in for followup.  We just saw her last week or so.  Her problem now is that she has this skin rash.  I do not know if this is from the bendamustine.  I do not know if it is from the combination of bendamustine plus allopurinol.  It is a little pruritic.  I will put her on a Medrol Dosepak.  The patient still complained of some abdominal pain.  Again, I am not sure what the etiology of this would be.  She may need to have a GI consult.  She has had no fever.  There may be one time where she had a little bit of a temperature.  She has had a little bit of leg and foot swelling bilaterally.  Her appetite comes and goes.  There is no cough.  She has had no shortness of breath.  Overall, her performance status is still ECOG 2.  PHYSICAL EXAMINATION:  General:  This is an elderly [place] female in no obvious distress.  Vital signs:  Temperature of 97.7, pulse 81, respiratory rate 16, blood pressure 91/41.  Weight is 110 pounds.  Head and neck:  Normocephalic, atraumatic skull.  There are no ocular or oral lesions.  There are no palpable cervical or supraclavicular lymph nodes. Lungs:  Clear bilaterally.  Cardiac:  Regular rate and rhythm with a normal S1, S2.  There are no murmurs, rubs or bruits.  Abdomen:  Soft. It does not appear to be as distended.  She has no fluid wave.  There may be some slight epigastric tenderness to palpation.  I really cannot palpate her spleen.  Liver edge is not palpable.  Legs:  Show some slight edema in her feet.  She has good pulses in her distal extremities.  Skin:  Shows some erythematous macular rash on her arms and legs.  This is slightly warm to touch.  LABS:  Pending.  IMPRESSION:  Stefanie Henry is an 79 year old [place] female with chronic lymphocytic leukemia.  We gave her 1 cycle of  chemotherapy with Treanda. She has had issues with this.  I even dose reduce her by about 30%. The rash may be an interaction between the allopurinol and the Treanda. I told her to stop the allopurinol.  I cannot feel the spleen so I have to believe that this is improving.  She is on Carafate for her abdomen.  She is due for an abdominal ultrasound next week.  She is due for her 2nd round of chemotherapy next week.  I think that she is not as bad off as she thinks that she is.  I just tried to reassure her and her son.    ______________________________ Stefanie Henry, M.D. PRE/MEDQ  D:  01/16/2013  T:  01/17/2013  Job:  [id number]

## 2013-01-24 ENCOUNTER — Ambulatory Visit (HOSPITAL_BASED_OUTPATIENT_CLINIC_OR_DEPARTMENT_OTHER)
Admission: RE | Admit: 2013-01-24 | Discharge: 2013-01-24 | Disposition: A | Discharge status: Home / Self Care | Payer: Medicare Other | Source: Ambulatory Visit | Attending: Hematology & Oncology | Admitting: Hematology & Oncology

## 2013-01-24 ENCOUNTER — Other Ambulatory Visit (HOSPITAL_COMMUNITY): Payer: Medicare Other

## 2013-01-24 ENCOUNTER — Telehealth: Payer: Self-pay | Admitting: *Deleted

## 2013-01-24 DIAGNOSIS — R161 Splenomegaly, not elsewhere classified: Secondary | ICD-10-CM | POA: Insufficient documentation

## 2013-01-24 DIAGNOSIS — C9110 Chronic lymphocytic leukemia of B-cell type not having achieved remission: Secondary | ICD-10-CM

## 2013-01-24 DIAGNOSIS — K7689 Other specified diseases of liver: Secondary | ICD-10-CM | POA: Insufficient documentation

## 2013-01-24 DIAGNOSIS — K8020 Calculus of gallbladder without cholecystitis without obstruction: Secondary | ICD-10-CM | POA: Insufficient documentation

## 2013-01-24 NOTE — Telephone Encounter (Signed)
Pt's daughter-in-law (who she lives with) called to report a temp of 101.0 at 1650. She wanted to know if it was ok to give her Tylenol. Asked her how long she has had the fever and she stated that "she started with feeling chilled this morning and then recently checked it and found it to be 101.0". Paged Dr [name] who stated he left a message for her son about her spleen being smaller but that her gallbladder has multiple stones and thickened walls. Told Mrs. Marawaha that Dr [name] thinks her fever is coming from her gallbladder and needs to be evaluated in the [facility]. She wanted to know if it would be ok to go ahead and give her some Tylenol for comfort as her husband is due to wake up in about an hour and has not heard Dr Gustavo Lah message (but she did). Explained that it would be best to take her sooner rather than later as she probably has been fighting this all day due to the reported chills in the morning. She verbalized understanding.

## 2013-01-25 ENCOUNTER — Ambulatory Visit: Payer: Medicare Other

## 2013-01-25 ENCOUNTER — Other Ambulatory Visit (HOSPITAL_BASED_OUTPATIENT_CLINIC_OR_DEPARTMENT_OTHER): Payer: Medicare Other | Admitting: Lab

## 2013-01-25 ENCOUNTER — Ambulatory Visit (HOSPITAL_BASED_OUTPATIENT_CLINIC_OR_DEPARTMENT_OTHER): Payer: Medicare Other | Admitting: Hematology & Oncology

## 2013-01-25 VITALS — BP 81/40 | Ht 59.0 in | Wt 109.0 lb

## 2013-01-25 DIAGNOSIS — D509 Iron deficiency anemia, unspecified: Secondary | ICD-10-CM

## 2013-01-25 DIAGNOSIS — R1013 Epigastric pain: Secondary | ICD-10-CM

## 2013-01-25 DIAGNOSIS — C9110 Chronic lymphocytic leukemia of B-cell type not having achieved remission: Secondary | ICD-10-CM

## 2013-01-25 DIAGNOSIS — K30 Functional dyspepsia: Secondary | ICD-10-CM

## 2013-01-25 LAB — CBC WITH DIFFERENTIAL (CANCER CENTER ONLY)
BASO#: 0 10*3/uL (ref 0.0–0.2)
BASO%: 0.1 % (ref 0.0–2.0)
EOS%: 1.7 % (ref 0.0–7.0)
HCT: 30.3 % — ABNORMAL LOW (ref 34.8–46.6)
LYMPH#: 6 10*3/uL — ABNORMAL HIGH (ref 0.9–3.3)
MCH: 24.4 pg — ABNORMAL LOW (ref 26.0–34.0)
MCHC: 33 g/dL (ref 32.0–36.0)
MONO%: 2.7 % (ref 0.0–13.0)
NEUT#: 0.7 10*3/uL — ABNORMAL LOW (ref 1.5–6.5)
NEUT%: 10.5 % — ABNORMAL LOW (ref 39.6–80.0)
RDW: 14.9 % (ref 11.1–15.7)

## 2013-01-25 MED ORDER — OMEPRAZOLE 20 MG PO CPDR: 20.0000 mg | DELAYED_RELEASE_CAPSULE | Freq: Every day | ORAL | Status: DC

## 2013-01-25 NOTE — Progress Notes (Signed)
This office note has been dictated.

## 2013-01-26 ENCOUNTER — Ambulatory Visit: Payer: Medicare Other

## 2013-01-26 NOTE — Progress Notes (Signed)
CC:   Arther Abbott, MD  DIAGNOSIS:  Chronic lymphocytic leukemia.  CURRENT THERAPY:  Patient is status post cycle 1 of Treanda.  INTERIM HISTORY:  Ms. Pevey comes in for her followup.  Unfortunately, I really think that her gallbladder is her problem.  As we did do an ultrasound of her abdomen yesterday.  This showed innumerable gallstones.  Her gallbladder was slightly distended.  Her spleen was decreased in size and now measured 610 cc.  Previously it was 744 cc. She says that she has pain whenever she eats.  She is not eating that much because of the postprandial pain. I really believe that her gallbladder is going to have to come out.  She had fevers yesterday.  Her blood pressure is a little on the low side. She feels okay today.  I spoke with Dr. Caryn Section of surgery.  He will see her and try to arrange for cholecystectomy if possible.  She has had no bleeding.  She has had no mouth sores.  She has had improvement in leg swelling. The rash that she developed, which likely was from bendamustine, is resolving.  Her skin has peeled a little bit. Of note, she also was on allopurinol so the combination of the two may have led to this rash.  She is on prednisone 10 mg a day.  I told to her to go down to 5 mg a day of prednisone.  PHYSICAL EXAMINATION:  General:  This is an elderly [place] female in no obvious distress.  Vital signs:  Temperature of 98.3, pulse 60, respiratory rate 18, blood pressure 81/40.  Weight is 109 pounds.  Head and neck:  Normocephalic, atraumatic skull.  There are no ocular or oral lesions.  There are no palpable cervical or supraclavicular lymph nodes. Lungs:  Clear bilaterally.  Cardiac:  Regular rate and rhythm with a normal S1, S2.  There are no murmurs, rubs or bruits.  Abdomen:  Soft. I cannot palpate her spleen.  There is some tenderness in the right upper quadrant.  There is no Murphy's sign.  Extremities:  Show some trace edema in her legs.   Neurological:  Shows no focal neurological deficits.  LABORATORY STUDIES:  White cell count is 7, hemoglobin 10, hematocrit 30.3, platelet count 80,000.  White cell differential shows 11 segs, 85 lymphocytes.  IMPRESSION:  Ms. Devereux is an 79 year old [place] female.  She has chronic lymphocytic leukemia.  When we first started her treatment, her white cell count was 200,000.  She has responded beautifully.  Her hemoglobin has improved.  Her splenomegaly is improving.  Again, we will see how her gallbladder goes.  I do believe that this is going to have to come out.  We will hold on any further chemotherapy on her.  That is to see how she does with the potential cholecystectomy.  We will plan to get her back in another month.  I told her son that if she has further issues with temperatures and chills that she has to go to the emergency room because I would be concerned about gallbladder issues, and the possibility of cholecystitis.  He understands this.    ______________________________ Josph Macho, M.D. PRE/MEDQ  D:  01/25/2013  T:  01/26/2013  Job:  [id number]

## 2013-01-27 ENCOUNTER — Ambulatory Visit (INDEPENDENT_AMBULATORY_CARE_PROVIDER_SITE_OTHER): Payer: Medicare Other | Admitting: Surgery

## 2013-01-27 ENCOUNTER — Encounter (INDEPENDENT_AMBULATORY_CARE_PROVIDER_SITE_OTHER): Payer: Self-pay | Admitting: Surgery

## 2013-01-27 VITALS — BP 124/70 | HR 74 | Temp 98.2°F | Resp 18

## 2013-01-27 DIAGNOSIS — K8020 Calculus of gallbladder without cholecystitis without obstruction: Secondary | ICD-10-CM | POA: Insufficient documentation

## 2013-01-27 NOTE — Progress Notes (Addendum)
Re:   Stefanie Henry DOB:   Jan 12, 1930 MRN:   [id number]  ASSESSMENT AND PLAN: 1.  Gall stones, chronic cholecystitis.  I discussed with the patient the indications and risks of gall bladder surgery.  The primary risks of gall bladder surgery include, but are not limited to, bleeding, infection, common bile duct injury, and open surgery.  There is also the risk that the patient may have continued symptoms after surgery. We discussed the typical post-operative recovery course. I tried to answer the patient's questions.  I did discuss alternative treatment of gallstones, primarily Actigall.  But I pointed out that she has large stones and she has a thickened gall bladder wall by [place], so Actigall would probably not work.  I gave the patient literature about gall bladder surgery.  The patient's son, Stefanie Henry, is with the patient and I think he will help his mother make the decision for or against surgery. I spent over an hour with patient and son, showed them the images of the GB in Epic, and answered questions.  For now, they want to think about the surgery and will contact me back.  They do not have a return appt. [The patient's family has talked to Stefanie Henry in our office about going ahead with surgery.  DN  02/02/2013.  2.  Chronic lymphocytic leukemia  Treated by Stefanie Henry 3.  History of asthma 4.  Arthritis 5.  On steroids -  Prednisone 10 mg/day 6.  Thrombocytopenia - 80,000 - 01/25/2013  This can be a side effect of Trianda. 7.  Infra mammary rash  Unknown reason - was on augmentin  Getting better. 8.  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  18 mm hypoechoic liver lesion beside gall bladder fossa  6 month follow up recommended  The patient has a copy of the [place] report and understands the recommendations.  Chief Complaint  Patient presents with  . Follow-up    discuss sx   REFERRING PHYSICIAN: Josph Macho, MD  HISTORY OF PRESENT ILLNESS: Stefanie Henry is a 79 y.o. (DOB:  05-Sep-1929)  [place]  female whose med oncologsit is Stefanie Macho, MD and Stefanie Henry [facility] Henry, Stefanie Henry, [place]:  [phone]) is her PCP and comes to me today for gall bladder disease.  The patient's son, Stefanie Henry, was with the patient and he answered 90% of the questions and is spokesman for the patient.  I was called by Stefanie Henry, who treats Stefanie Henry for CLL, about her having fevers and now found gall stones.  The patient has had one febrile episode that lasted a couple of days in the past 6 weeks.  She's had some mild LUQ pain, but no RUQ pain.  She describes a "tightness". This pain may be better with the decrease in size of the spleen.  She was treated for the fever with Augmentin. Because she has not felt well, she is eating only a small amount of food, according to her son.  She has had no nausea or vomiting.  She's had no abdominal pain related to eating food.  She's noticed no change in her bowel habits.  She's had no prior abdominal surgery.  She has no history of stomach disease.  No history of liver disease.  No history of gall bladder disease.  No history of pancreas disease.  No history of colon disease.   Past Medical History  Diagnosis Date  . Asthma   . Chronic lymphatic leukemia   .  Arthritis   . Shingles   . Anemia, iron deficiency 06/06/2012     No past surgical history on file.    Current Outpatient Prescriptions  Medication Sig Dispense Refill  . albuterol (PROVENTIL) (5 MG/ML) 0.5% nebulizer solution Take 2.5 mg by nebulization every 6 (six) hours as needed.      . famciclovir (FAMVIR) 250 MG tablet Take 1 tablet a day.  30 tablet  8  . fluticasone (FLONASE) 50 MCG/ACT nasal spray Place 1 spray into the nose as needed.       . gabapentin (NEURONTIN) 100 MG capsule Take 100 mg by mouth as needed.      . methylPREDNIsolone (MEDROL DOSPACK) 4 MG tablet       . omeprazole (PRILOSEC) 20 MG capsule Take 1 capsule (20 mg total) by  mouth daily.  30 capsule  4  . potassium chloride SA (K-DUR,KLOR-CON) 20 MEQ tablet Take 2 tablets twice a day for 3 days, then 2 tablets a day.  60 tablet  4  . predniSONE (DELTASONE) 10 MG tablet Take 1 tablet (10 mg total) by mouth daily.  30 tablet  0  . prochlorperazine (COMPAZINE) 10 MG tablet Take 1 tablet (10 mg total) by mouth every 6 (six) hours as needed.  30 tablet  2  . Sennosides (SENNA) 15 MG TABS Take by mouth every morning.      . sucralfate (CARAFATE) 1 GM/10ML suspension Take 2 Tsp 3 times a day.  420 mL  4  . triamterene-hydrochlorothiazide (MAXZIDE-25) 37.5-25 MG per tablet        No current facility-administered medications for this visit.     No Known Allergies  REVIEW OF SYSTEMS: Skin:  No history of rash.  No history of abnormal moles. Infection:  No history of hepatitis or HIV.  No history of MRSA. Neurologic:  No history of stroke.  No history of seizure.  No history of headaches. Cardiac:  No history of hypertension. No history of heart disease.  No history of prior cardiac catheterization.  No history of seeing a cardiologist. Pulmonary:  History of asthma  Endocrine:  No diabetes. No thyroid disease. Gastrointestinal:  See HPI Urologic:  No history of kidney stones.  No history of bladder infections. Musculoskeletal:  Knee and back pain.  Sees Stefanie Henry at [facility]. 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   Responded to Treanda. Thrombocytopenia.  She had been followed in [place] by Stefanie Henry Psycho-social:  The patient is oriented.   The patient has no obvious psychologic or social impairment to understanding our conversation and plan.  SOCIAL and FAMILY HISTORY: Married.  Husband has myeloma. The patient's son, Stefanie Henry, was with the patient.  PHYSICAL EXAM: There were no vitals taken for this visit.  General: Small WN [place] F who is alert.  HEENT: Normal. Pupils equal. Neck: Supple. No mass.  No thyroid mass. Lymph  Nodes:  No supraclavicular or cervical nodes. Lungs: Clear to auscultation and symmetric breath sounds. Heart:  RRR. No murmur or rub. Abdomen: Soft. No tenderness. No hernia. Normal bowel sounds.  No abdominal scars.  I can feel her spleen in the LUQ, about 3 cm below the costal margin.  It is not tender.  She has no RUQ tenderness. Rectal: Not done. Extremities:  Normal ROM  in upper and lower extremities.  She moves slowly, came in our office by wheelchair, but that was primarily because her son said it would take a  long time to get her into the office if he did not use it. Neurologic:  Grossly intact to motor and sensory function. Psychiatric: Has normal mood and affect. Behavior is normal.   DATA REVIEWED: Epic notes, particularly those of Stefanie Henry.  The patient already had copies of labs and [place].  Ovidio Kin, MD,  [facility] [address] [place]  [address]   [place], [place]    [address] Phone:  [phone] FAX:  [phone]

## 2013-01-29 ENCOUNTER — Encounter (HOSPITAL_BASED_OUTPATIENT_CLINIC_OR_DEPARTMENT_OTHER): Payer: Self-pay | Admitting: *Deleted

## 2013-01-29 ENCOUNTER — Emergency Department (HOSPITAL_BASED_OUTPATIENT_CLINIC_OR_DEPARTMENT_OTHER)
Admission: EM | Admit: 2013-01-29 | Discharge: 2013-01-29 | Disposition: A | Discharge status: Home / Self Care | Payer: Medicare Other | Attending: Emergency Medicine | Admitting: Emergency Medicine

## 2013-01-29 DIAGNOSIS — M129 Arthropathy, unspecified: Secondary | ICD-10-CM | POA: Insufficient documentation

## 2013-01-29 DIAGNOSIS — Z862 Personal history of diseases of the blood and blood-forming organs and certain disorders involving the immune mechanism: Secondary | ICD-10-CM | POA: Insufficient documentation

## 2013-01-29 DIAGNOSIS — R21 Rash and other nonspecific skin eruption: Secondary | ICD-10-CM

## 2013-01-29 DIAGNOSIS — J45909 Unspecified asthma, uncomplicated: Secondary | ICD-10-CM | POA: Insufficient documentation

## 2013-01-29 DIAGNOSIS — Z8619 Personal history of other infectious and parasitic diseases: Secondary | ICD-10-CM | POA: Insufficient documentation

## 2013-01-29 DIAGNOSIS — C9110 Chronic lymphocytic leukemia of B-cell type not having achieved remission: Secondary | ICD-10-CM | POA: Insufficient documentation

## 2013-01-29 DIAGNOSIS — Z79899 Other long term (current) drug therapy: Secondary | ICD-10-CM | POA: Insufficient documentation

## 2013-01-29 DIAGNOSIS — IMO0002 Reserved for concepts with insufficient information to code with codable children: Secondary | ICD-10-CM | POA: Insufficient documentation

## 2013-01-29 MED ORDER — DOXEPIN HCL 25 MG PO CAPS: 25.0000 mg | ORAL_CAPSULE | Freq: Every day | ORAL | Status: DC

## 2013-01-29 MED ORDER — PREDNISONE 50 MG PO TABS
60.0000 mg | ORAL_TABLET | Freq: Once | ORAL | Status: AC
  Administered 2013-01-29: 60 mg via ORAL
  Filled 2013-01-29: qty 1

## 2013-01-29 MED ORDER — PREDNISONE 20 MG PO TABS: ORAL_TABLET | ORAL | Status: DC

## 2013-01-29 MED ORDER — HYDROXYZINE HCL 25 MG PO TABS: 25.0000 mg | ORAL_TABLET | Freq: Four times a day (QID) | ORAL | Status: DC | PRN

## 2013-01-29 MED ORDER — FAMOTIDINE 20 MG PO TABS: 20.0000 mg | ORAL_TABLET | Freq: Two times a day (BID) | ORAL | Status: DC

## 2013-01-29 MED ORDER — FENTANYL CITRATE 0.05 MG/ML IJ SOLN
100.0000 ug | Freq: Once | INTRAMUSCULAR | Status: AC
  Administered 2013-01-29: 100 ug via NASAL
  Filled 2013-01-29: qty 2

## 2013-01-29 NOTE — ED Notes (Signed)
Patient has a rash that developed shortly after chemotherapy and had been taking steroids and has completed dose. Rash still remains on upper trunk

## 2013-01-29 NOTE — ED Provider Notes (Signed)
CSN: [id number]     Arrival date & time 01/29/13  1003 History     First MD Initiated Contact with Patient 01/29/13 1148     Chief Complaint  Patient presents with  . Rash   (Consider location/radiation/quality/duration/timing/severity/associated sxs/prior Treatment) HPI This 79 year old female has a history of CLL undergoing chemotherapy, she has a rash for the last couple weeks that improved but did not resolve with a one-week Medrol Dosepak, she has had a rash now for 2 weeks total, over the last weeks and she is offered Medrol Dosepak she is still taking oral Benadryl with persistent rash, the rash is no longer on her legs, is still on her trunk however, she still is diffuse rash over her chest abdomen and back which is quite itchy, and she has no involvement of her eyes mouth or genital region she is no blistering rash no bruising rash no skin sloughing no dizziness no confusion tongue swelling lip swelling shortness of breath vomiting or other concerns. She has known gallstones and has had some recent abdominal pain but that is not her concern today. Past Medical History  Diagnosis Date  . Asthma   . Arthritis   . Shingles   . Anemia, iron deficiency 06/06/2012  . Chronic lymphatic leukemia    History reviewed. No pertinent past surgical history. No family history on file. History  Substance Use Topics  . Smoking status: Never Smoker   . Smokeless tobacco: Not on file  . Alcohol Use: No   OB History   Grav Para Term Preterm Abortions TAB SAB Ect Mult Living                 Review of Systems 10 Systems reviewed and are negative for acute change except as noted in the HPI. Allergies  Review of patient's allergies indicates no known allergies.  Home Medications   Current Outpatient Rx  Name  Route  Sig  Dispense  Refill  . sodium chloride 0.9 % SOLN 500 mL with bendamustine 100 MG SOLR   Intravenous   Inject into the vein once.         Marland Kitchen albuterol (PROVENTIL) (5  MG/ML) 0.5% nebulizer solution   Nebulization   Take 2.5 mg by nebulization every 6 (six) hours as needed.         . doxepin (SINEQUAN) 25 MG capsule   Oral   Take 1 capsule (25 mg total) by mouth at bedtime.   7 capsule   0   . famciclovir (FAMVIR) 250 MG tablet      Take 1 tablet a day.   30 tablet   8   . famotidine (PEPCID) 20 MG tablet   Oral   Take 1 tablet (20 mg total) by mouth 2 (two) times daily.   10 tablet   0   . fluticasone (FLONASE) 50 MCG/ACT nasal spray   Nasal   Place 1 spray into the nose as needed.          . gabapentin (NEURONTIN) 100 MG capsule   Oral   Take 100 mg by mouth as needed.         . hydrOXYzine (ATARAX/VISTARIL) 25 MG tablet   Oral   Take 1 tablet (25 mg total) by mouth every 6 (six) hours as needed for itching.   20 tablet   0   . methylPREDNIsolone (MEDROL DOSPACK) 4 MG tablet               .  omeprazole (PRILOSEC) 20 MG capsule   Oral   Take 1 capsule (20 mg total) by mouth daily.   30 capsule   4   . potassium chloride SA (K-DUR,KLOR-CON) 20 MEQ tablet      Take 2 tablets twice a day for 3 days, then 2 tablets a day.   60 tablet   4   . predniSONE (DELTASONE) 10 MG tablet   Oral   Take 5 mg by mouth daily.         . predniSONE (DELTASONE) 20 MG tablet      3 tabs po daily x 3 days, then 2 tabs x 3 days, then 1.5 tabs x 3 days, then 1 tab x 3 days, then 0.5 tabs x 3 days   27 tablet   0   . prochlorperazine (COMPAZINE) 10 MG tablet   Oral   Take 1 tablet (10 mg total) by mouth every 6 (six) hours as needed.   30 tablet   2   . Sennosides (SENNA) 15 MG TABS   Oral   Take by mouth every morning.         . sucralfate (CARAFATE) 1 GM/10ML suspension      Take 2 Tsp 3 times a day.   420 mL   4   . triamterene-hydrochlorothiazide (MAXZIDE-25) 37.5-25 MG per tablet                BP 101/54  Pulse 92  Temp(Src) 99 F (37.2 C) (Oral)  Resp 16  Ht 4\' 8"  (1.422 m)  Wt 109 lb (49.442 kg)   BMI 24.45 kg/m2  SpO2 99% Physical Exam  Nursing note and vitals reviewed. Constitutional:  Awake, alert, nontoxic appearance.  HENT:  Head: Atraumatic.  Mouth/Throat: Oropharynx is clear and moist.  Eyes: Conjunctivae are normal. Right eye exhibits no discharge. Left eye exhibits no discharge.  Neck: Neck supple.  Cardiovascular: Normal rate and regular rhythm.   No murmur heard. Pulmonary/Chest: Effort normal and breath sounds normal. No respiratory distress. She has no wheezes. She has no rales. She exhibits no tenderness.  Abdominal: Soft. Bowel sounds are normal. She exhibits no distension. There is no tenderness. There is no rebound and no guarding.  Musculoskeletal: She exhibits no edema and no tenderness.  Baseline ROM, no obvious new focal weakness.  Neurological: She is alert.  Mental status and motor strength appears baseline for patient and situation.  Skin: Rash noted.  The patient has slightly raised mildly erythematous plaques over most of her chest abdomen and back which are nontender with capillary refill less than 2 seconds no petechiae no purpura no vesicles no significant tenderness no induration no fluctuance no skin sloughing  Psychiatric: She has a normal mood and affect.    ED Course  Patient / Family / Caregiver informed of clinical course, understand medical decision-making process, and agree with plan. Procedures (including critical care time)  Labs Reviewed - No data to display No results found. 1. Rash     MDM  I doubt any other EMC precluding discharge at this time including, but not necessarily limited to the following:anaphylaxis, SBI, TEN, SJS.  Hurman Horn, MD 01/29/13 2240

## 2013-01-31 ENCOUNTER — Other Ambulatory Visit: Payer: Self-pay | Admitting: Hematology & Oncology

## 2013-02-02 ENCOUNTER — Other Ambulatory Visit (INDEPENDENT_AMBULATORY_CARE_PROVIDER_SITE_OTHER): Payer: Self-pay | Admitting: Surgery

## 2013-02-02 ENCOUNTER — Telehealth (INDEPENDENT_AMBULATORY_CARE_PROVIDER_SITE_OTHER): Payer: Self-pay | Admitting: *Deleted

## 2013-02-02 ENCOUNTER — Other Ambulatory Visit (INDEPENDENT_AMBULATORY_CARE_PROVIDER_SITE_OTHER): Payer: Self-pay

## 2013-02-02 NOTE — Telephone Encounter (Signed)
Spoke to [employer] (patient's daughter in law) and patient who state they do what to move forward with the surgery.  Explained that this RN will send a message to Dr. Ezzard Standing to place orders and then at that time the surgery schedulers will call them to schedule the surgery.  Stefanie Henry states understanding at this time and agreeable.

## 2013-02-02 NOTE — Telephone Encounter (Signed)
Amy with Dr. Junie Bame office called today to ask about patient's surgery being scheduled.  She states that patient called her and was told her surgery would not be until the 31st.  This RN doesn't even see where orders have been placed so the surgery schedulers would contact her to schedule the surgery.  Explained to Amy that a message will be sent to Dr. Ezzard Standing to try to understanding what is going on since she states Dr. Ezzard Standing and Dr. Twanna Hy have been talking about this patient.

## 2013-02-16 ENCOUNTER — Encounter (INDEPENDENT_AMBULATORY_CARE_PROVIDER_SITE_OTHER): Payer: Medicare Other | Admitting: Surgery

## 2013-02-21 ENCOUNTER — Telehealth: Payer: Self-pay | Admitting: Hematology & Oncology

## 2013-02-21 NOTE — Telephone Encounter (Signed)
Pt's son cx 8-27 and 28th said pt has not had gallbladder surgery yet. He wanted to speak to RN transferred to [facility]

## 2013-02-22 ENCOUNTER — Ambulatory Visit: Payer: Medicare Other

## 2013-02-22 ENCOUNTER — Ambulatory Visit: Payer: Medicare Other | Admitting: Hematology & Oncology

## 2013-02-22 ENCOUNTER — Other Ambulatory Visit: Payer: Medicare Other | Admitting: Lab

## 2013-02-23 ENCOUNTER — Ambulatory Visit: Payer: Medicare Other

## 2013-02-25 ENCOUNTER — Other Ambulatory Visit: Payer: Self-pay | Admitting: Hematology & Oncology

## 2013-03-21 ENCOUNTER — Telehealth: Payer: Self-pay | Admitting: Hematology & Oncology

## 2013-03-21 NOTE — Telephone Encounter (Signed)
Patient's daughter-in-law called and cx 03/22/13 and 03/23/13 apt.  She stated, they will call back to resch after patient has her surgery

## 2013-03-22 ENCOUNTER — Other Ambulatory Visit: Payer: Medicare Other | Admitting: Lab

## 2013-03-22 ENCOUNTER — Ambulatory Visit: Payer: Medicare Other | Admitting: Hematology & Oncology

## 2013-03-22 ENCOUNTER — Ambulatory Visit: Payer: Medicare Other

## 2013-03-23 ENCOUNTER — Ambulatory Visit: Payer: Medicare Other

## 2013-04-06 ENCOUNTER — Other Ambulatory Visit: Payer: Self-pay | Admitting: Hematology & Oncology

## 2013-04-20 ENCOUNTER — Encounter (HOSPITAL_COMMUNITY): Admission: RE | Payer: Self-pay | Source: Ambulatory Visit

## 2013-04-20 ENCOUNTER — Ambulatory Visit (HOSPITAL_COMMUNITY): Admission: RE | Admit: 2013-04-20 | Payer: Medicare Other | Source: Ambulatory Visit | Admitting: Surgery

## 2013-04-20 SURGERY — LAPAROSCOPIC CHOLECYSTECTOMY WITH INTRAOPERATIVE CHOLANGIOGRAM
Anesthesia: General

## 2013-06-01 ENCOUNTER — Other Ambulatory Visit: Payer: Self-pay | Admitting: Hematology & Oncology

## 2013-07-11 ENCOUNTER — Other Ambulatory Visit: Payer: Self-pay | Admitting: Hematology & Oncology

## 2013-08-10 DIAGNOSIS — M1710 Unilateral primary osteoarthritis, unspecified knee: Secondary | ICD-10-CM | POA: Diagnosis not present

## 2013-08-21 ENCOUNTER — Other Ambulatory Visit: Payer: Self-pay | Admitting: Hematology & Oncology

## 2013-08-31 DIAGNOSIS — R141 Gas pain: Secondary | ICD-10-CM | POA: Diagnosis not present

## 2013-08-31 DIAGNOSIS — K649 Unspecified hemorrhoids: Secondary | ICD-10-CM | POA: Diagnosis not present

## 2013-08-31 DIAGNOSIS — R0989 Other specified symptoms and signs involving the circulatory and respiratory systems: Secondary | ICD-10-CM | POA: Diagnosis not present

## 2013-08-31 DIAGNOSIS — R142 Eructation: Secondary | ICD-10-CM | POA: Diagnosis not present

## 2013-08-31 DIAGNOSIS — R0609 Other forms of dyspnea: Secondary | ICD-10-CM | POA: Diagnosis not present

## 2013-08-31 DIAGNOSIS — C9110 Chronic lymphocytic leukemia of B-cell type not having achieved remission: Secondary | ICD-10-CM | POA: Diagnosis not present

## 2013-09-12 DIAGNOSIS — D649 Anemia, unspecified: Secondary | ICD-10-CM | POA: Diagnosis not present

## 2013-09-12 DIAGNOSIS — R1084 Generalized abdominal pain: Secondary | ICD-10-CM | POA: Diagnosis not present

## 2013-09-12 DIAGNOSIS — R143 Flatulence: Secondary | ICD-10-CM | POA: Diagnosis not present

## 2013-09-12 DIAGNOSIS — R141 Gas pain: Secondary | ICD-10-CM | POA: Diagnosis not present

## 2013-09-26 ENCOUNTER — Encounter (INDEPENDENT_AMBULATORY_CARE_PROVIDER_SITE_OTHER): Payer: Medicare Other | Admitting: Surgery

## 2013-09-27 ENCOUNTER — Other Ambulatory Visit: Payer: Self-pay | Admitting: Hematology & Oncology

## 2013-10-05 ENCOUNTER — Encounter (INDEPENDENT_AMBULATORY_CARE_PROVIDER_SITE_OTHER): Payer: Self-pay

## 2013-10-05 ENCOUNTER — Other Ambulatory Visit (INDEPENDENT_AMBULATORY_CARE_PROVIDER_SITE_OTHER): Payer: Self-pay

## 2013-10-05 ENCOUNTER — Ambulatory Visit (INDEPENDENT_AMBULATORY_CARE_PROVIDER_SITE_OTHER): Payer: Medicare Other | Admitting: Surgery

## 2013-10-05 ENCOUNTER — Encounter (INDEPENDENT_AMBULATORY_CARE_PROVIDER_SITE_OTHER): Payer: Self-pay | Admitting: Surgery

## 2013-10-05 VITALS — BP 122/78 | HR 74 | Temp 97.3°F | Ht <= 58 in | Wt 124.8 lb

## 2013-10-05 DIAGNOSIS — K8020 Calculus of gallbladder without cholecystitis without obstruction: Secondary | ICD-10-CM

## 2013-10-05 NOTE — Progress Notes (Addendum)
Re:   Stefanie Henry DOB:   [birth year] MRN:   [id number]  ASSESSMENT AND PLAN: 1.  Gall stones, chronic cholecystitis.  We had a long discussion about the options of treatment.  They decided not to have surgery last year, but are relooking at this.  The patient's son, Stefanie Henry, is with the patient and he helps his mother make medical decisions, including what to do with the gall bladder. I spent 30 minutes with patient and son and answered questions.  I gave him copies of the [place] report from 01/24/2013 and copies of my last office note from 01/27/2014.  Plan:  1) [place] of abdomen to look at spleen, gall bladder, and "lesion" in liver, 2) .  After we had talked about this, he came back and asked [place] to make a referral to GI medicine.  We will refer her to [facility].   I think a gastroenterologist could answer some of his questions and I think will be worth the time.  They could also consider an upper endo. Of note, she also has never had a colonscopy.  For now, I have not given him a follow up appt with me.  But I will talk to him on the phone about the [place].   [The US showed the liver lesion the same or smaller and suggested one year follow up.  The gall stones are still noted and the spleen is about the same size.  I talked to her son, Stefanie Henry, on the phone and went over this.  She has an appointment for a GI doctor in June.  Her appointment with me is open.  DN 10/09/2013]  2.  Chronic lymphocytic leukemia  Treated by Dr. Pearletha Alfred  But they have not seen Dr. [name] since last summer.  I encouraged them to make contact with him. 3.  History of asthma 4.  Arthritis 5.  On steroids -  Prednisone 5 mg/day 6.  Thrombocytopenia - 80,000 - 01/25/2013  This can be a side effect of Trianda.  I do not have recent labs, though the patient's son said that it is better. 7.  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  18 mm hypoechoic liver lesion beside gall bladder fossa  6 month follow up recommended  The patient has  a copy of the [place] report and understands the recommendations.  We'll plan an [place] first.  Depending on the US findings, we can make further recommendations.  Chief Complaint  Patient presents with  . eval galllbladder   REFERRING PHYSICIAN: [name], MD  HISTORY OF PRESENT ILLNESS: Stefanie Henry is a 80 y.o. (DOB: 11/21/1929)  [place]  female whose med oncologsit is [name], MD and Dr. Eldred Manges [facility], [place], [place]:  [phone]) is her PCP and comes to me today to again discuss gall bladder disease.   The patient's son, Stefanie Henry, is with the patient and he asked a most of the questions.  He appears to be the [facility] for the patient. She has vague GI complaints.  She complains of "gas issues", tightness in her epigastrium, and she burps a lot.  She's had some upper abdominal pain, but it is not consistently related to anything that she eats. We talked about surgery that did not occur last September, but she went to [place], was doing better and did not follow up with me. She also has not seen Dr. [name] since last summer.  The son thought that she did not tolerate  the treatment for the CLL, so there was not reason to see him.  History of gall bladder disease: I was called by Dr. [name], who treats [name] for CLL, about her having fevers and now found gall stones.  The patient has had one febrile episode that lasted a couple of days in the past 6 weeks.  She's had some mild LUQ pain, but no RUQ pain.  She describes a "tightness". This pain may be better with the decrease in size of the spleen.  She was treated for the fever with Augmentin. Because she has not felt well, she is eating only a small amount of food, according to her son.  She has had no nausea or vomiting.  She's had no abdominal pain related to eating food.  She's noticed no change in her bowel habits.  She's had no prior abdominal surgery.  She has no history of stomach  disease.  No history of liver disease.  No history of gall bladder disease.  No history of pancreas disease.  No history of colon disease.   Past Medical History  Diagnosis Date  . Asthma   . Arthritis   . Shingles   . Anemia, iron deficiency 06/06/2012  . Chronic lymphatic leukemia      No past surgical history on file.    Current Outpatient Prescriptions  Medication Sig Dispense Refill  . albuterol (PROVENTIL) (5 MG/ML) 0.5% nebulizer solution Take 2.5 mg by nebulization every 6 (six) hours as needed.      . doxepin (SINEQUAN) 25 MG capsule Take 1 capsule (25 mg total) by mouth at bedtime.  7 capsule  0  . famotidine (PEPCID) 20 MG tablet Take 1 tablet (20 mg total) by mouth 2 (two) times daily.  10 tablet  0  . fluticasone (FLONASE) 50 MCG/ACT nasal spray Place 1 spray into the nose as needed.       . gabapentin (NEURONTIN) 100 MG capsule Take 100 mg by mouth as needed.      Marland Kitchen omeprazole (PRILOSEC) 20 MG capsule Take 1 capsule (20 mg total) by mouth daily.  30 capsule  4  . potassium chloride SA (K-DUR,KLOR-CON) 20 MEQ tablet Take 2 tablets twice a day for 3 days, then 2 tablets a day.  60 tablet  4  . predniSONE (DELTASONE) 10 MG tablet TAKE 1 TABLET BY MOUTH EVERY DAY  30 tablet  0  . Sennosides (SENNA) 15 MG TABS Take by mouth every morning.      . sodium chloride 0.9 % SOLN 500 mL with bendamustine 100 MG SOLR Inject into the vein once.      . predniSONE (DELTASONE) 10 MG tablet Take 5 mg by mouth daily.      . prochlorperazine (COMPAZINE) 10 MG tablet Take 1 tablet (10 mg total) by mouth every 6 (six) hours as needed.  30 tablet  2   No current facility-administered medications for this visit.     No Known Allergies  REVIEW OF SYSTEMS: Skin:  No history of rash.  No history of abnormal moles. Infection:  No history of hepatitis or HIV.  No history of MRSA. Neurologic:  No history of stroke.  No history of seizure.  No history of headaches. Cardiac:  No history of  hypertension. No history of heart disease.  No history of prior cardiac catheterization.  No history of seeing a cardiologist. Pulmonary:  History of asthma  Endocrine:  No diabetes. No thyroid disease. Gastrointestinal:  See HPI  Urologic:  No history of kidney stones.  No history of bladder infections. Musculoskeletal:  Knee and back pain.  Sees Dr. Tamera Punt at [place]. 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   Responded to Treanda. Thrombocytopenia.  She had been followed in [place] by Dr. Darrall Dears Psycho-social:  The patient is oriented.   The patient has no obvious psychologic or social impairment to understanding our conversation and plan.  SOCIAL and FAMILY HISTORY: Married.  Husband has myeloma. The patient's son, Kelina Beauchamp, was with the patient.  PHYSICAL EXAM: BP 122/78  Pulse 74  Temp(Src) 97.3 F (36.3 C) (Oral)  Ht 4\' 8"  (1.422 m)  Wt 124 lb 12.8 oz (56.609 kg)  BMI 28.00 kg/m2  General: Small WN [place] F who is alert.  HEENT: Normal. Pupils equal.  She is missing several teeth. Neck: Supple. No mass.  No thyroid mass. Lymph Nodes:  No supraclavicular or cervical nodes. Lungs: Clear to auscultation and symmetric breath sounds. Heart:  RRR. No murmur or rub. Abdomen: Soft. No tenderness. No hernia. Normal bowel sounds.  No abdominal scars.  I can feel her spleen in the LUQ, about 3 cm below the costal margin.  It is not tender.  She has no RUQ tenderness.  My physical exam is unchanged from last summer. Rectal: Not done. Extremities:  Normal ROM  in upper and lower extremities.    She moves slowly, came in our office by wheelchair, but that was primarily because her son said it would take a long time to get her into the office if he did not use it. Neurologic:  Grossly intact to motor and sensory function. Psychiatric: Has normal mood and affect. Behavior is normal.   DATA REVIEWED: Not much new from the last visit.  Alphonsa Overall, MD,   [facility], [address]  [place], [place]    [place] Phone:  [phone] FAX:  [phone]

## 2013-10-06 ENCOUNTER — Encounter: Payer: Self-pay | Admitting: Gastroenterology

## 2013-10-09 ENCOUNTER — Ambulatory Visit (HOSPITAL_BASED_OUTPATIENT_CLINIC_OR_DEPARTMENT_OTHER)
Admission: RE | Admit: 2013-10-09 | Discharge: 2013-10-09 | Disposition: A | Discharge status: Home / Self Care | Payer: Medicare Other | Source: Ambulatory Visit | Attending: Surgery | Admitting: Surgery

## 2013-10-09 DIAGNOSIS — K8020 Calculus of gallbladder without cholecystitis without obstruction: Secondary | ICD-10-CM | POA: Insufficient documentation

## 2013-10-25 DIAGNOSIS — IMO0002 Reserved for concepts with insufficient information to code with codable children: Secondary | ICD-10-CM | POA: Diagnosis not present

## 2013-10-25 DIAGNOSIS — M1710 Unilateral primary osteoarthritis, unspecified knee: Secondary | ICD-10-CM | POA: Diagnosis not present

## 2013-10-30 ENCOUNTER — Other Ambulatory Visit: Payer: Self-pay | Admitting: Hematology & Oncology

## 2013-11-07 DIAGNOSIS — R209 Unspecified disturbances of skin sensation: Secondary | ICD-10-CM | POA: Diagnosis not present

## 2013-11-07 DIAGNOSIS — C9110 Chronic lymphocytic leukemia of B-cell type not having achieved remission: Secondary | ICD-10-CM | POA: Diagnosis not present

## 2013-11-07 DIAGNOSIS — R142 Eructation: Secondary | ICD-10-CM | POA: Diagnosis not present

## 2013-11-07 DIAGNOSIS — R1084 Generalized abdominal pain: Secondary | ICD-10-CM | POA: Diagnosis not present

## 2013-11-07 DIAGNOSIS — R141 Gas pain: Secondary | ICD-10-CM | POA: Diagnosis not present

## 2013-11-25 DIAGNOSIS — M1710 Unilateral primary osteoarthritis, unspecified knee: Secondary | ICD-10-CM | POA: Diagnosis not present

## 2013-11-27 ENCOUNTER — Encounter: Payer: Self-pay | Admitting: Gastroenterology

## 2013-11-27 ENCOUNTER — Ambulatory Visit (INDEPENDENT_AMBULATORY_CARE_PROVIDER_SITE_OTHER): Payer: Medicare Other | Admitting: Gastroenterology

## 2013-11-27 VITALS — BP 90/56 | HR 80 | Ht <= 58 in | Wt 126.0 lb

## 2013-11-27 DIAGNOSIS — R143 Flatulence: Secondary | ICD-10-CM

## 2013-11-27 DIAGNOSIS — K30 Functional dyspepsia: Secondary | ICD-10-CM

## 2013-11-27 DIAGNOSIS — R1013 Epigastric pain: Secondary | ICD-10-CM

## 2013-11-27 DIAGNOSIS — R933 Abnormal findings on diagnostic imaging of other parts of digestive tract: Secondary | ICD-10-CM

## 2013-11-27 DIAGNOSIS — R109 Unspecified abdominal pain: Secondary | ICD-10-CM

## 2013-11-27 DIAGNOSIS — R142 Eructation: Secondary | ICD-10-CM

## 2013-11-27 DIAGNOSIS — K3189 Other diseases of stomach and duodenum: Secondary | ICD-10-CM

## 2013-11-27 DIAGNOSIS — R141 Gas pain: Secondary | ICD-10-CM

## 2013-11-27 DIAGNOSIS — K8020 Calculus of gallbladder without cholecystitis without obstruction: Secondary | ICD-10-CM

## 2013-11-27 MED ORDER — OMEPRAZOLE 20 MG PO CPDR: 20.0000 mg | DELAYED_RELEASE_CAPSULE | Freq: Two times a day (BID) | ORAL | Status: DC

## 2013-11-27 NOTE — Progress Notes (Signed)
    History of Present Illness: This is an 80 year old female accompanied by her son. Son provides most of the translation. Patient has been evaluated by Dr. Lucia Gaskins who felt she had chronic cholecystitis and cholelithiasis. The patient declined surgery. Dr. Lucia Gaskins recommended evaluation with [facility]. The patient has a long history of frequent, mild upper abdominal discomfort, bloating and gas. Her symptoms partially respond to Gas-X, Beano, probiotics, Mylanta, Pepcid and omeprazole. Her symptoms have not significantly changed over the years. She has occasional mild constipation.   Abd [place] 09/2013: Stable to slight decrease in size of right hepatic lobe hypoechoic hepatic lesion which could represent a cyst but is not further characterized.  Gallstones without other sonographic evidence for acute  cholecystitis. Borderline splenomegaly reidentified.  Consider follow-up abdominal ultrasound in 1 year for surveillance of the hepatic lesion.   Denies weight loss, diarrhea, change in stool caliber, melena, hematochezia, nausea, vomiting, dysphagia, chest pain.  Review of Systems: Pertinent positive and negative review of systems were noted in the above HPI section. All other review of systems were otherwise negative.  Current Medications, Allergies, Past Medical History, Past Surgical History, Family History and Social History were reviewed in [employer] record.  Physical Exam: General: Well developed , well nourished, elderly, no acute distress, uses a cane Head: Normocephalic and atraumatic Eyes:  sclerae anicteric, EOMI Ears: Normal auditory acuity Mouth: No deformity or lesions Neck: Supple, no masses or thyromegaly Lungs: Clear throughout to auscultation Heart: Regular rate and rhythm; no murmurs, rubs or bruits Abdomen: Soft, non tender and non distended. No masses, hepatosplenomegaly or hernias noted. Normal Bowel sounds Musculoskeletal: Symmetrical with no gross  deformities  Skin: No lesions on visible extremities Pulses:  Normal pulses noted Extremities: No clubbing, cyanosis, edema or deformities noted Neurological: Alert oriented x 4, grossly nonfocal Cervical Nodes:  No significant cervical adenopathy Inguinal Nodes: No significant inguinal adenopathy Psychological:  Alert and cooperative. Normal mood and affect  Assessment and Recommendations:  1. Frequent, mild upper abdominal pain associated with bloating. Known cholelithiasis. Possible chronic cholecystitis, GERD, gastritis, functional dyspepsia. I spent over 30 minutes with her and her son reviewing options for further evaluation including CT scan, EGD, upper GI series or empiric treatment. I recommended proceeding with an abd/pelvic CT scan and upper endoscopy or UGI series. Patient declined CT scan and UGI as she states she does not like to drink the liquids required for the studies. The risks, benefits, and alternatives to endoscopy with possible biopsy and possible dilation were discussed with the patient and she declines to proceed. The patient prefers only medications at this time. Increase omeprazole to 20 mg twice a day, consider increasing to 40 mg twice a day. Patient is returned to her primary care physician for ongoing management.  2. Hepatic lesion on abdominal ultrasound, probable cyst. Repeat ultrasound in one year with her primary care physician.

## 2013-11-27 NOTE — Patient Instructions (Addendum)
Please increase your omeprazole to 20 mg tablets by mouth twice daily. A new prescription has been sent to your pharmacy.   Patient advised to avoid spicy, acidic, citrus, chocolate, mints, fruit and fruit juices.  Limit the intake of caffeine, alcohol and Soda.  Don't exercise too soon after eating.  Don't lie down within 3-4 hours of eating.  Elevate the head of your bed.  Please return to your Primary Care Physician for ongoing care.  Thank you for choosing me and [facility].  Pricilla Riffle. Dagoberto Ligas., MD., Marval Regal  cc: Burney Gauze, MD

## 2013-12-01 ENCOUNTER — Encounter: Payer: Medicare Other | Admitting: Gastroenterology

## 2013-12-13 ENCOUNTER — Other Ambulatory Visit: Payer: Self-pay | Admitting: Hematology & Oncology

## 2014-01-01 DIAGNOSIS — C9110 Chronic lymphocytic leukemia of B-cell type not having achieved remission: Secondary | ICD-10-CM | POA: Diagnosis not present

## 2014-01-01 DIAGNOSIS — R141 Gas pain: Secondary | ICD-10-CM | POA: Diagnosis not present

## 2014-01-01 DIAGNOSIS — R1084 Generalized abdominal pain: Secondary | ICD-10-CM | POA: Diagnosis not present

## 2014-01-01 DIAGNOSIS — R142 Eructation: Secondary | ICD-10-CM | POA: Diagnosis not present

## 2014-01-01 DIAGNOSIS — E559 Vitamin D deficiency, unspecified: Secondary | ICD-10-CM | POA: Diagnosis not present

## 2014-01-01 DIAGNOSIS — R7309 Other abnormal glucose: Secondary | ICD-10-CM | POA: Diagnosis not present

## 2014-01-01 DIAGNOSIS — R42 Dizziness and giddiness: Secondary | ICD-10-CM | POA: Diagnosis not present

## 2014-01-01 DIAGNOSIS — R143 Flatulence: Secondary | ICD-10-CM | POA: Diagnosis not present

## 2014-01-02 ENCOUNTER — Other Ambulatory Visit: Payer: Self-pay | Admitting: Hematology & Oncology

## 2014-03-16 ENCOUNTER — Other Ambulatory Visit: Payer: Self-pay | Admitting: Hematology & Oncology

## 2014-05-31 DIAGNOSIS — M4856XA Collapsed vertebra, not elsewhere classified, lumbar region, initial encounter for fracture: Secondary | ICD-10-CM | POA: Diagnosis not present

## 2014-05-31 DIAGNOSIS — M545 Low back pain: Secondary | ICD-10-CM | POA: Diagnosis not present

## 2014-06-01 DIAGNOSIS — K219 Gastro-esophageal reflux disease without esophagitis: Secondary | ICD-10-CM | POA: Diagnosis not present

## 2014-06-01 DIAGNOSIS — J449 Chronic obstructive pulmonary disease, unspecified: Secondary | ICD-10-CM | POA: Diagnosis not present

## 2014-06-01 DIAGNOSIS — M545 Low back pain: Secondary | ICD-10-CM | POA: Diagnosis not present

## 2014-06-01 DIAGNOSIS — W19XXXA Unspecified fall, initial encounter: Secondary | ICD-10-CM | POA: Diagnosis not present

## 2014-06-05 DIAGNOSIS — Q619 Cystic kidney disease, unspecified: Secondary | ICD-10-CM | POA: Diagnosis not present

## 2014-06-05 DIAGNOSIS — K8020 Calculus of gallbladder without cholecystitis without obstruction: Secondary | ICD-10-CM | POA: Diagnosis not present

## 2014-06-05 DIAGNOSIS — M4856XA Collapsed vertebra, not elsewhere classified, lumbar region, initial encounter for fracture: Secondary | ICD-10-CM | POA: Diagnosis not present

## 2014-06-05 DIAGNOSIS — S32019A Unspecified fracture of first lumbar vertebra, initial encounter for closed fracture: Secondary | ICD-10-CM | POA: Diagnosis not present

## 2014-06-05 DIAGNOSIS — M4317 Spondylolisthesis, lumbosacral region: Secondary | ICD-10-CM | POA: Diagnosis not present

## 2014-06-05 DIAGNOSIS — M129 Arthropathy, unspecified: Secondary | ICD-10-CM | POA: Diagnosis not present

## 2014-06-05 DIAGNOSIS — M5126 Other intervertebral disc displacement, lumbar region: Secondary | ICD-10-CM | POA: Diagnosis not present

## 2014-06-05 DIAGNOSIS — M4806 Spinal stenosis, lumbar region: Secondary | ICD-10-CM | POA: Diagnosis not present

## 2014-06-05 DIAGNOSIS — M5127 Other intervertebral disc displacement, lumbosacral region: Secondary | ICD-10-CM | POA: Diagnosis not present

## 2014-06-08 ENCOUNTER — Emergency Department (HOSPITAL_COMMUNITY): Payer: Medicare Other

## 2014-06-08 ENCOUNTER — Encounter (HOSPITAL_COMMUNITY): Payer: Self-pay | Admitting: *Deleted

## 2014-06-08 ENCOUNTER — Inpatient Hospital Stay (HOSPITAL_COMMUNITY): Payer: Medicare Other

## 2014-06-08 ENCOUNTER — Inpatient Hospital Stay (HOSPITAL_COMMUNITY)
Admission: EM | Admit: 2014-06-08 | Discharge: 2014-06-11 | DRG: 552 | Disposition: A | Discharge status: Rehabilitation Facility | Payer: Medicare Other | Attending: Family Medicine | Admitting: Family Medicine

## 2014-06-08 DIAGNOSIS — K8020 Calculus of gallbladder without cholecystitis without obstruction: Secondary | ICD-10-CM | POA: Diagnosis present

## 2014-06-08 DIAGNOSIS — Y92009 Unspecified place in unspecified non-institutional (private) residence as the place of occurrence of the external cause: Secondary | ICD-10-CM | POA: Diagnosis not present

## 2014-06-08 DIAGNOSIS — Z7952 Long term (current) use of systemic steroids: Secondary | ICD-10-CM | POA: Diagnosis not present

## 2014-06-08 DIAGNOSIS — G8929 Other chronic pain: Secondary | ICD-10-CM | POA: Diagnosis present

## 2014-06-08 DIAGNOSIS — S32000S Wedge compression fracture of unspecified lumbar vertebra, sequela: Secondary | ICD-10-CM | POA: Diagnosis not present

## 2014-06-08 DIAGNOSIS — M545 Low back pain, unspecified: Secondary | ICD-10-CM

## 2014-06-08 DIAGNOSIS — M1990 Unspecified osteoarthritis, unspecified site: Secondary | ICD-10-CM | POA: Diagnosis present

## 2014-06-08 DIAGNOSIS — D509 Iron deficiency anemia, unspecified: Secondary | ICD-10-CM | POA: Diagnosis present

## 2014-06-08 DIAGNOSIS — W19XXXA Unspecified fall, initial encounter: Secondary | ICD-10-CM | POA: Diagnosis present

## 2014-06-08 DIAGNOSIS — Z91018 Allergy to other foods: Secondary | ICD-10-CM | POA: Diagnosis not present

## 2014-06-08 DIAGNOSIS — Z91041 Radiographic dye allergy status: Secondary | ICD-10-CM | POA: Diagnosis not present

## 2014-06-08 DIAGNOSIS — S32000D Wedge compression fracture of unspecified lumbar vertebra, subsequent encounter for fracture with routine healing: Secondary | ICD-10-CM | POA: Diagnosis not present

## 2014-06-08 DIAGNOSIS — M8008XA Age-related osteoporosis with current pathological fracture, vertebra(e), initial encounter for fracture: Secondary | ICD-10-CM | POA: Diagnosis present

## 2014-06-08 DIAGNOSIS — M5136 Other intervertebral disc degeneration, lumbar region: Secondary | ICD-10-CM | POA: Diagnosis not present

## 2014-06-08 DIAGNOSIS — M8448XA Pathological fracture, other site, initial encounter for fracture: Secondary | ICD-10-CM | POA: Diagnosis not present

## 2014-06-08 DIAGNOSIS — K219 Gastro-esophageal reflux disease without esophagitis: Secondary | ICD-10-CM | POA: Diagnosis present

## 2014-06-08 DIAGNOSIS — S32018A Other fracture of first lumbar vertebra, initial encounter for closed fracture: Secondary | ICD-10-CM | POA: Diagnosis present

## 2014-06-08 DIAGNOSIS — S32000A Wedge compression fracture of unspecified lumbar vertebra, initial encounter for closed fracture: Secondary | ICD-10-CM

## 2014-06-08 DIAGNOSIS — Z66 Do not resuscitate: Secondary | ICD-10-CM | POA: Diagnosis present

## 2014-06-08 DIAGNOSIS — M160 Bilateral primary osteoarthritis of hip: Secondary | ICD-10-CM | POA: Diagnosis not present

## 2014-06-08 DIAGNOSIS — S32009D Unspecified fracture of unspecified lumbar vertebra, subsequent encounter for fracture with routine healing: Secondary | ICD-10-CM | POA: Diagnosis not present

## 2014-06-08 DIAGNOSIS — M25551 Pain in right hip: Secondary | ICD-10-CM | POA: Diagnosis not present

## 2014-06-08 DIAGNOSIS — W19XXXS Unspecified fall, sequela: Secondary | ICD-10-CM | POA: Diagnosis not present

## 2014-06-08 DIAGNOSIS — R161 Splenomegaly, not elsewhere classified: Secondary | ICD-10-CM | POA: Diagnosis not present

## 2014-06-08 DIAGNOSIS — Z888 Allergy status to other drugs, medicaments and biological substances status: Secondary | ICD-10-CM | POA: Diagnosis not present

## 2014-06-08 DIAGNOSIS — J45909 Unspecified asthma, uncomplicated: Secondary | ICD-10-CM | POA: Diagnosis present

## 2014-06-08 DIAGNOSIS — R140 Abdominal distension (gaseous): Secondary | ICD-10-CM | POA: Diagnosis not present

## 2014-06-08 DIAGNOSIS — M549 Dorsalgia, unspecified: Secondary | ICD-10-CM | POA: Diagnosis not present

## 2014-06-08 DIAGNOSIS — C9110 Chronic lymphocytic leukemia of B-cell type not having achieved remission: Secondary | ICD-10-CM | POA: Diagnosis present

## 2014-06-08 DIAGNOSIS — M25552 Pain in left hip: Secondary | ICD-10-CM | POA: Diagnosis not present

## 2014-06-08 DIAGNOSIS — K5900 Constipation, unspecified: Secondary | ICD-10-CM | POA: Diagnosis not present

## 2014-06-08 HISTORY — DX: Wedge compression fracture of unspecified lumbar vertebra, initial encounter for closed fracture: S32.000A

## 2014-06-08 LAB — COMPREHENSIVE METABOLIC PANEL
ALBUMIN: 3.7 g/dL (ref 3.5–5.2)
ALT: 34 U/L (ref 0–35)
ANION GAP: 14 (ref 5–15)
AST: 18 U/L (ref 0–37)
Alkaline Phosphatase: 138 U/L — ABNORMAL HIGH (ref 39–117)
BUN: 13 mg/dL (ref 6–23)
CO2: 25 mEq/L (ref 19–32)
CREATININE: 0.59 mg/dL (ref 0.50–1.10)
Calcium: 9.3 mg/dL (ref 8.4–10.5)
Chloride: 95 mEq/L — ABNORMAL LOW (ref 96–112)
GFR calc Af Amer: >90 mL/min (ref >90)
GFR calc non Af Amer: 83 mL/min — ABNORMAL LOW (ref >90)
Glucose, Bld: 126 mg/dL — ABNORMAL HIGH (ref 70–99)
Potassium: 5 mEq/L (ref 3.7–5.3)
Sodium: 134 mEq/L — ABNORMAL LOW (ref 137–147)
TOTAL PROTEIN: 6.3 g/dL (ref 6.0–8.3)
Total Bilirubin: 0.3 mg/dL (ref 0.3–1.2)

## 2014-06-08 LAB — CBC WITH DIFFERENTIAL/PLATELET
BASOS ABS: 0 10*3/uL (ref 0.0–0.1)
Basophils Relative: 0 % (ref 0–1)
Eosinophils Absolute: 0 10*3/uL (ref 0.0–0.7)
Eosinophils Relative: 0 % (ref 0–5)
HCT: 36.1 % (ref 36.0–46.0)
Hemoglobin: 11.5 g/dL — ABNORMAL LOW (ref 12.0–15.0)
LYMPHS ABS: 62.6 10*3/uL — AB (ref 0.7–4.0)
Lymphocytes Relative: 87 % — ABNORMAL HIGH (ref 12–46)
MCH: 23.5 pg — AB (ref 26.0–34.0)
MCHC: 31.9 g/dL (ref 30.0–36.0)
MCV: 73.7 fL — AB (ref 78.0–100.0)
MONO ABS: 0.7 10*3/uL (ref 0.1–1.0)
MONOS PCT: 1 % — AB (ref 3–12)
NEUTROS PCT: 12 % — AB (ref 43–77)
Neutro Abs: 8.6 10*3/uL — ABNORMAL HIGH (ref 1.7–7.7)
PLATELETS: 126 10*3/uL — AB (ref 150–400)
RBC: 4.9 MIL/uL (ref 3.87–5.11)
RDW: 14.3 % (ref 11.5–15.5)
WBC: 71.9 10*3/uL — AB (ref 4.0–10.5)

## 2014-06-08 MED ORDER — FENTANYL CITRATE 0.05 MG/ML IJ SOLN
50.0000 ug | Freq: Once | INTRAMUSCULAR | Status: AC
  Administered 2014-06-08: 50 ug via INTRAVENOUS
  Filled 2014-06-08: qty 2

## 2014-06-08 MED ORDER — SODIUM CHLORIDE 0.9 % IV BOLUS (SEPSIS)
1000.0000 mL | Freq: Once | INTRAVENOUS | Status: AC
  Administered 2014-06-08: 1000 mL via INTRAVENOUS

## 2014-06-08 NOTE — ED Notes (Signed)
Pt at CT

## 2014-06-08 NOTE — ED Provider Notes (Signed)
CSN: [id number]     Arrival date & time 06/08/14  1636 History   First MD Initiated Contact with Patient 06/08/14 1748     Chief Complaint  Patient presents with  . Back Pain     (Consider location/radiation/quality/duration/timing/severity/associated sxs/prior Treatment) The history is provided by the patient. Language interpreter used: [place] - daughter translates and primary speaker for patient.  Kerri-Anne Haeberle is an 80 y/o F with PMHx of asthma, arthritis, shingles, CLL, anemia, gallstones presenting to the ED with back pain after a fall that occurred on 05/29/2014 while visiting in [place], [place]. Daughter reported that patient was walking out of the dining room and stated that she lost her balance and fell from a standing physician down onto her buttocks on tile. Stated that patient has been dealing with pain since then. Reported that patient was seen and assessed in the ED at [facility] [place], [place] where a plain film was performed that identified a L1 compression fracture where MRI of lumbar spine was recommended - MRI of the lumbar spine was performed with negative results read - daughter comes to the ED with CD. Stated that patient has been being treated with Tramadol at home with minimal relief. Reported that she can barely walk - stated that she shuffles secondary to pain. Reported that patient has been unable to get out of bed secondary to pain. Stated that patient has to walk with a walker but needs assistance which is not been normal for her. Denied head injury, loss of conscious, blurred vision, sudden loss of vision, chest pain, shortness of breath, difficulty breathing, dizziness, confusion, changes to personality, urinary and bowel incontinence. PCP Dr. [name]  Past Medical History  Diagnosis Date  . Asthma   . Arthritis   . Shingles   . Anemia, iron deficiency 06/06/2012  . Chronic lymphatic leukemia   . Gallstones    History reviewed. No pertinent past surgical history. Family History    Problem Relation Age of Onset  . Diabetes Father    History  Substance Use Topics  . Smoking status: Never Smoker   . Smokeless tobacco: Never Used  . Alcohol Use: No   OB History    No data available     Review of Systems  Constitutional: Negative for fever and chills.  Respiratory: Negative for chest tightness and shortness of breath.   Cardiovascular: Negative for chest pain.  Gastrointestinal: Positive for abdominal pain and constipation. Negative for nausea, vomiting and diarrhea.  Musculoskeletal: Positive for back pain.  Neurological: Negative for dizziness, weakness, numbness and headaches.      Allergies  Contrast media; Fruit & vegetable daily; and Ibuprofen  Home Medications   Prior to Admission medications   Medication Sig Start Date End Date Taking? Authorizing Provider  ADVAIR DISKUS 250-50 MCG/DOSE AEPB Inhale 1 puff into the lungs as needed (for shortness of breath).  11/07/13  Yes Historical Provider, MD  albuterol (PROVENTIL HFA;VENTOLIN HFA) 108 (90 BASE) MCG/ACT inhaler Inhale 1-2 puffs into the lungs every 6 (six) hours as needed for wheezing or shortness of breath.   Yes Historical Provider, MD  albuterol (PROVENTIL) (5 MG/ML) 0.5% nebulizer solution Take 2.5 mg by nebulization every 6 (six) hours as needed for shortness of breath.    Yes Historical Provider, MD  Alpha-D-Galactosidase Satira Mccallum) TABS Take 1 tablet by mouth as needed (for bloating).    Yes Historical Provider, MD  Calcium & Magnesium Carbonates (MYLANTA PO) Take 1 tablet by mouth as needed (for indigestion).  Yes Historical Provider, MD  famotidine (PEPCID) 20 MG tablet Take 20 mg by mouth daily.   Yes Historical Provider, MD  gabapentin (NEURONTIN) 100 MG capsule Take 100 mg by mouth daily.    Yes Historical Provider, MD  omeprazole (PRILOSEC) 20 MG capsule Take 1 capsule (20 mg total) by mouth 2 (two) times daily before a meal. Patient taking differently: Take 20 mg by mouth daily.   11/27/13  Yes Ladene Artist, MD  Probiotic Product (PROBIOTIC DAILY PO) Take 1 capsule by mouth daily.   Yes Historical Provider, MD  Sennosides (SENNA) 15 MG TABS Take by mouth every morning.   Yes Historical Provider, MD  Simethicone (GAS-X PO) Take 2 capsules by mouth daily.    Yes Historical Provider, MD  KLOR-CON M20 20 MEQ tablet TAKE 2 TABLETS TWICE A DAY FOR 3 DAYS THEN 2 TABLETS ONCE DAILY 10/30/13   [name], MD  predniSONE (DELTASONE) 10 MG tablet TAKE 1 TABLET BY MOUTH EVERY DAY 03/16/14   [name], MD  predniSONE (DELTASONE) 5 MG tablet Take 5 mg by mouth daily with breakfast.    Historical Provider, MD   BP 127/59 mmHg  Pulse 77  Temp(Src) 98 F (36.7 C) (Oral)  Resp 21  SpO2 96% Physical Exam  Constitutional: She is oriented to person, place, and time. She appears well-developed and well-nourished. No distress.  HENT:  Head: Normocephalic and atraumatic.  Mouth/Throat: Oropharynx is clear and moist. No oropharyngeal exudate.  Eyes: Conjunctivae and EOM are normal. Pupils are equal, round, and reactive to light. Right eye exhibits no discharge. Left eye exhibits no discharge.  Neck: Normal range of motion. Neck supple. No tracheal deviation present.  Cardiovascular: Normal rate, regular rhythm and normal heart sounds.  Exam reveals no friction rub.   No murmur heard. Pulmonary/Chest: Effort normal and breath sounds normal. No respiratory distress. She has no wheezes. She has no rales.  Abdominal: Soft. She exhibits distension. There is tenderness. There is no rebound and no guarding.  Abdominal distension noted BS mildly decreased in all 4 quadrants Abdomen soft upon palpation  Mild discomfort upon palpation to the abdomen - generalized Negative peritoneal signs   Musculoskeletal: Normal range of motion. She exhibits tenderness.       Lumbar back: She exhibits tenderness and bony tenderness. She exhibits normal range of motion, no swelling, no edema, no  deformity and no laceration.       Back:  Negative deformities identified to the spine. Discomfort upon palpation to the mid spine-lumbar region. Negative bulging. Full range of motion to upper and lower extremities bilaterally without difficulty or ataxia noted.  Lymphadenopathy:    She has no cervical adenopathy.  Neurological: She is alert and oriented to person, place, and time. No cranial nerve deficit. She exhibits normal muscle tone. Coordination normal.  Cranial nerves III-XII grossly intact Strength 5+/5+ to upper and lower extremities bilaterally with resistance applied, equal distribution noted Equal grip strength  Negative saddle paresthesias bilaterally  Negative arm drift Fine motor skills intact  Skin: Skin is warm and dry. No rash noted. She is not diaphoretic. No erythema.  Psychiatric: She has a normal mood and affect. Her behavior is normal. Thought content normal.  Nursing note and vitals reviewed.   ED Course  Procedures (including critical care time)  Results for orders placed or performed during the hospital encounter of 06/08/14  CBC with Differential  Result Value Ref Range   WBC 71.9 (HH) 4.0 -  10.5 K/uL   RBC 4.90 3.87 - 5.11 MIL/uL   Hemoglobin 11.5 (L) 12.0 - 15.0 g/dL   HCT 36.1 36.0 - 46.0 %   MCV 73.7 (L) 78.0 - 100.0 fL   MCH 23.5 (L) 26.0 - 34.0 pg   MCHC 31.9 30.0 - 36.0 g/dL   RDW 14.3 11.5 - 15.5 %   Platelets 126 (L) 150 - 400 K/uL   Neutrophils Relative % 12 (L) 43 - 77 %   Lymphocytes Relative 87 (H) 12 - 46 %   Monocytes Relative 1 (L) 3 - 12 %   Eosinophils Relative 0 0 - 5 %   Basophils Relative 0 0 - 1 %   Neutro Abs 8.6 (H) 1.7 - 7.7 K/uL   Lymphs Abs 62.6 (H) 0.7 - 4.0 K/uL   Monocytes Absolute 0.7 0.1 - 1.0 K/uL   Eosinophils Absolute 0.0 0.0 - 0.7 K/uL   Basophils Absolute 0.0 0.0 - 0.1 K/uL   RBC Morphology ELLIPTOCYTES    WBC Morphology ATYPICAL LYMPHOCYTES   Comprehensive metabolic panel  Result Value Ref Range   Sodium  134 (L) 137 - 147 mEq/L   Potassium 5.0 3.7 - 5.3 mEq/L   Chloride 95 (L) 96 - 112 mEq/L   CO2 25 19 - 32 mEq/L   Glucose, Bld 126 (H) 70 - 99 mg/dL   BUN 13 6 - 23 mg/dL   Creatinine, Ser 0.59 0.50 - 1.10 mg/dL   Calcium 9.3 8.4 - 10.5 mg/dL   Total Protein 6.3 6.0 - 8.3 g/dL   Albumin 3.7 3.5 - 5.2 g/dL   AST 18 0 - 37 U/L   ALT 34 0 - 35 U/L   Alkaline Phosphatase 138 (H) 39 - 117 U/L   Total Bilirubin 0.3 0.3 - 1.2 mg/dL   GFR calc non Af Amer 83 (L) >90 mL/min   GFR calc Af Amer >90 >90 mL/min   Anion gap 14 5 - 15    Labs Review Labs Reviewed  CBC WITH DIFFERENTIAL - Abnormal; Notable for the following:    WBC 71.9 (*)    Hemoglobin 11.5 (*)    MCV 73.7 (*)    MCH 23.5 (*)    Platelets 126 (*)    Neutrophils Relative % 12 (*)    Lymphocytes Relative 87 (*)    Monocytes Relative 1 (*)    Neutro Abs 8.6 (*)    Lymphs Abs 62.6 (*)    All other components within normal limits  COMPREHENSIVE METABOLIC PANEL - Abnormal; Notable for the following:    Sodium 134 (*)    Chloride 95 (*)    Glucose, Bld 126 (*)    Alkaline Phosphatase 138 (*)    GFR calc non Af Amer 83 (*)    All other components within normal limits  URINALYSIS, ROUTINE W REFLEX MICROSCOPIC    Imaging Review Ct Abdomen Pelvis Wo Contrast  06/08/2014   CLINICAL DATA:  [place] on 05/29/2014, presenting now with back pain, abdominal distention and constipation. Current history of CLL.  EXAM: CT ABDOMEN AND PELVIS WITHOUT CONTRAST  TECHNIQUE: Multidetector CT imaging of the abdomen and pelvis was performed following the standard protocol without IV contrast.  COMPARISON:  None.  FINDINGS: Normal unenhanced appearance of the liver, pancreas, adrenal glands, kidneys, and gallbladder. Splenomegaly, the spleen measuring approximately 11.0 x 7.9 x 13.7 cm, yielding a volume of approximately 595 ml. No focal splenic parenchymal abnormality. No biliary ductal dilation. Moderate aorto iliac atherosclerosis  without  aneurysm. No significant lymphadenopathy.  Normal-appearing stomach and small bowel. Moderate stool burden throughout the colon. Descending and sigmoid colon diverticulosis without evidence of acute diverticulitis. Normal appendix in the right mid pelvis. No ascites.  Retroverted uterus containing numerous calcified, degenerating fibroids. No adnexal masses or free pelvic fluid. Numerous pelvic phleboliths. Urinary bladder decompressed and unremarkable.  Bone window images demonstrate osseous demineralization, old obscuring a fracture involving the lower endplate of L1, severe multifactorial spinal stenosis at the L1-2 and L4-5 levels, degenerative changes involving the sacroiliac joints, degenerative changes involving the facet joints of the lower lumbar spine, and lower thoracic spondylosis. Visualized lung bases clear apart from scarring in the right middle lobe. Heart moderately enlarged. Eventration of the right anterior hemidiaphragm.  IMPRESSION: 1. No acute abnormalities involving the abdomen or pelvis. 2. Splenomegaly. No evidence of significant lymphadenopathy in this patient with a history of CLL. 3. Descending and sigmoid colon diverticulosis without evidence of acute diverticulitis. 4. Numerous calcified, degenerating uterine fibroids. 5. Osseous findings as above.   Electronically Signed   By: Evangeline Dakin M.D.   On: 06/08/2014 21:28     EKG Interpretation None       7:55 PM This provider finally got in contact with [facility]. MRI results to be faxed over to the ED.    10:00 PM This provider spoke with Family Medicine resident - discussed case, labs, imaging, ED course in great detail. Patient to be admitted to family medicine under the care of Dr. Esmond Camper. Patient to be admitted for pain control and physical therapy.  10:09 PM This provider had a long discussion with family regarding   MDM   Final diagnoses:  Fall, initial encounter  Lumbar  compression fracture, with routine healing, subsequent encounter  Midline low back pain without sciatica    Medications  sodium chloride 0.9 % bolus 1,000 mL (1,000 mLs Intravenous New Bag/Given 06/08/14 1947)  fentaNYL (SUBLIMAZE) injection 50 mcg (50 mcg Intravenous Given 06/08/14 2029)    Filed Vitals:   06/08/14 1945 06/08/14 2000 06/08/14 2030 06/08/14 2204  BP: 117/55 138/84 117/58 127/59  Pulse: 78 77 71 77  Temp:    98 F (36.7 C)  TempSrc:    Oral  Resp: 19 20 13 21   SpO2: 96% 97% 99% 96%   CBC noted elevated white blood cell count 71.9-patient has history of CLL. Hemoglobin 11.5, hematocrit 36.1. CMP noted sodium of 134, chloride of 95. Alkaline phosphatase mildly elevated at 138-patient does have history of gallstones. Glucose 126, anion gap 14.0 mEq/L. CT abdomen and pelvis without contrast no acute abnormalities identified. Splenomegaly-no evidence of significant lymphadenopathy in this patient with a history of CLL. Descending and sigmoid colon diverticulosis without evidence of acute diverticulitis. MRI of lumbar spine performed at [facility] in [place], [place] provided to the ED via fax. MRI of the lumbar spine identified acute L1 compression fracture with mild loss of height-no retropulsion. Multilevel lumbar spondylosis results in up to moderate canal stenosis at L4-5. Small probable right S1-S2 facet joint synovial cyst with mild mass effect on the adjacent thecal sac. Patient given IV fluids and pain medications while in the ED setting. Negative acute findings noted on the CT and MRI. Negative spinal cord involvement noted. Negative signs of focal neurological deficits. Discussed case in great detail with attending physician, Dr. Roxine Caddy, who agreed to plan of admission. Patient to be admitted for pain control and PT/OT. Discussed plan  for admission with family and patient at bedside - agree to plan of care. Patient stable for transfer.   Jamse Mead, PA-C 06/08/14 2224  Malvin Johns, MD 06/08/14 [phone]

## 2014-06-08 NOTE — ED Notes (Signed)
Lab called with critical value of WBC of 71.9 reported to provider.

## 2014-06-08 NOTE — ED Notes (Signed)
Per family, pt had a fall on 12/1 and went to diff hospital on 12/3, was diagnosed with lumbar compression fracture. Has been prescribed tramadol and already had MRI done. Reports no relief of pain and having difficulty ambulating.

## 2014-06-08 NOTE — H&P (Signed)
[facility] Admission History and Physical Service Pager: [phone]  Patient name: Stefanie Henry Medical record number: [id number] Date of birth: 19-May-1930 Age: 80 y.o. Gender: female  Primary Care Provider: [name], MD Consultants: PT & OT Code Status: Full; considering DO NOT RESUSCITATE status  Chief Complaint: Lumbar back pain  Assessment and Plan: Jaquilla Woodroof is a 80 y.o. female presenting with lumbar compression fracture and pain control. PMH is significant for  CLL, anemia, gallstones.   # Lumbar Pain: Due to compression fracture after fall on 05/29/14.  On 10 mg prednisone daily. Results from lumbar x-rays and MRI for OSH should be being scanned in. Fall likely due to generalized weakness (CLL, anemia).  - Repeat lumbar and hip xrays - PT/OT eval - Pain control: Diluadid 0.5 mg q4hrs prn; Zofran for Nausea  # CLL with leukocytosis, anemia and thrombocytopenia.  previously followed by Dr [name] oncology; Last seen 01/25/13. S/p 1 cycle of chemotherapy with Treanda; did not tolerate this well and does not wish to continue chemotherapy. Splenomegaly. Hgb 11.5; appears to be slightly above baseline. Plt 87, WBC 71.9  - Continue prednisone 10 mg daily  # Gall stones: Evaluated by [facility] 11/27/48. Opted for medical management  -  continue home medication: Beano, Pepcid, Prilosec, senna, probiotics   FEN/GI: D5 1/2NS + 30 K @ 75. (Monitor K; on Klor-Con at home)  Reg diet Prophylax: Heparin (monitor plts)  Disposition: Admit attending Dr. Mingo Amber; dispo pending.  PT/OT recommendations  History of Present Illness: Stefanie Henry is a 80 y.o. female presenting with lumbar back pain since December 1st. She reports falling and landing on her buttocks on December 1st while visiting family in [place], [place].  She was taken to the emergency department and diagnosed with L1 compression fracture after x-rays and lumbar MRI.  She was sent home with Ultram  for pain control.  She reports intermittent, 10 out of 10 pain since then that is worse with movement.  Denies any new falls or trauma.  Denies previous history of falls or previous fractures.  Denies any chest pain, shortness of breath, palpitations, or dizziness prior to fall.  States that she simply lost her balance and tripped.  She has been taking Ultram at home as scheduled, which has helped her pain minimally.  Denies any current fevers, chills, dysuria, lower extremity numbness.  Does endorse generalized weakness that is unchanged, which she attributes to her anemia and CLL.   Review Of Systems: Per HPI with the following additions:  Otherwise 12 point review of systems was performed and was unremarkable.  Patient Active Problem List   Diagnosis Date Noted  . Lumbar compression fracture 06/08/2014  . Gall stones 01/27/2013  . Chronic lymphoid leukemia, without mention of having achieved remission(204.10) 06/06/2012  . Anemia, iron deficiency 06/06/2012   Past Medical History: Past Medical History  Diagnosis Date  . Asthma   . Arthritis   . Shingles   . Anemia, iron deficiency 06/06/2012  . Chronic lymphatic leukemia   . Gallstones    Past Surgical History: History reviewed. No pertinent past surgical history. Social History: History  Substance Use Topics  . Smoking status: Never Smoker   . Smokeless tobacco: Never Used  . Alcohol Use: No   Additional social history:  Please also refer to relevant sections of EMR.  Family History: Family History  Problem Relation Age of Onset  . Diabetes Father    Allergies and Medications: Allergies  Allergen Reactions  .  Contrast Media [Iodinated Diagnostic Agents] Other (See Comments)    Stomach discomfort  . Fruit & Vegetable Daily [Nutritional Supplements] Other (See Comments)    Citrus fruit causes stomach discomfort  . Ibuprofen Other (See Comments)    Stomach discomfort   No current facility-administered medications on  file prior to encounter.   Current Outpatient Prescriptions on File Prior to Encounter  Medication Sig Dispense Refill  . ADVAIR DISKUS 250-50 MCG/DOSE AEPB Inhale 1 puff into the lungs as needed (for shortness of breath).     Marland Kitchen albuterol (PROVENTIL) (5 MG/ML) 0.5% nebulizer solution Take 2.5 mg by nebulization every 6 (six) hours as needed for shortness of breath.     . Alpha-D-Galactosidase (BEANO) TABS Take 1 tablet by mouth as needed (for bloating).     . Calcium & Magnesium Carbonates (MYLANTA PO) Take 1 tablet by mouth as needed (for indigestion).     . gabapentin (NEURONTIN) 100 MG capsule Take 100 mg by mouth daily.     Marland Kitchen omeprazole (PRILOSEC) 20 MG capsule Take 1 capsule (20 mg total) by mouth 2 (two) times daily before a meal. (Patient taking differently: Take 20 mg by mouth daily. ) 60 capsule 11  . Probiotic Product (PROBIOTIC DAILY PO) Take 1 capsule by mouth daily.    . Sennosides (SENNA) 15 MG TABS Take by mouth every morning.    . Simethicone (GAS-X PO) Take 2 capsules by mouth daily.     Marland Kitchen KLOR-CON M20 20 MEQ tablet TAKE 2 TABLETS TWICE A DAY FOR 3 DAYS THEN 2 TABLETS ONCE DAILY 60 tablet 4  . predniSONE (DELTASONE) 10 MG tablet TAKE 1 TABLET BY MOUTH EVERY DAY 30 tablet 0    Objective: BP 127/59 mmHg  Pulse 77  Temp(Src) 98 F (36.7 C) (Oral)  Resp 21  SpO2 96% Exam: General: Frail elderly female; NAD HEENT: MMM Cardiovascular: RRR, No m/r/g Respiratory: CTAB; normal respiratory effort Abdomen: SNTND; BS + Extremities: WWP; No edema Skin: No rash Back: Midline lumbar tenderness; mild paraspinal tenderness bilaterally with increase muscle tension Neuro: Lower extremity strength and sensation intact bilaterally; negative straight leg raise bilaterally  Labs and Imaging: CBC BMET   Recent Labs Lab 06/08/14 1803  WBC 71.9*  HGB 11.5*  HCT 36.1  PLT 126*    Recent Labs Lab 06/08/14 1803  NA 134*  K 5.0  CL 95*  CO2 25  BUN 13  CREATININE 0.59   GLUCOSE 126*  CALCIUM 9.3     CT Abdomen 12/11 IMPRESSION: 1. No acute abnormalities involving the abdomen or pelvis. 2. Splenomegaly. No evidence of significant lymphadenopathy in this patient with a history of CLL. 3. Descending and sigmoid colon diverticulosis without evidence of acute diverticulitis. 4. Numerous calcified, degenerating uterine fibroids. 5. Osseous findings as above.  Olam Idler, MD 06/08/2014, 11:30 PM PGY-2, [place] Intern pager: [phone], text pages welcome

## 2014-06-09 DIAGNOSIS — M549 Dorsalgia, unspecified: Secondary | ICD-10-CM | POA: Diagnosis present

## 2014-06-09 DIAGNOSIS — K8020 Calculus of gallbladder without cholecystitis without obstruction: Secondary | ICD-10-CM

## 2014-06-09 DIAGNOSIS — C9110 Chronic lymphocytic leukemia of B-cell type not having achieved remission: Secondary | ICD-10-CM | POA: Insufficient documentation

## 2014-06-09 DIAGNOSIS — W19XXXA Unspecified fall, initial encounter: Secondary | ICD-10-CM

## 2014-06-09 HISTORY — DX: Dorsalgia, unspecified: M54.9

## 2014-06-09 LAB — BASIC METABOLIC PANEL
Anion gap: 13 (ref 5–15)
BUN: 10 mg/dL (ref 6–23)
CO2: 22 mEq/L (ref 19–32)
CREATININE: 0.57 mg/dL (ref 0.50–1.10)
Calcium: 8.4 mg/dL (ref 8.4–10.5)
Chloride: 103 mEq/L (ref 96–112)
GFR calc Af Amer: >90 mL/min (ref >90)
GFR, EST NON AFRICAN AMERICAN: 84 mL/min — AB (ref >90)
Glucose, Bld: 110 mg/dL — ABNORMAL HIGH (ref 70–99)
Potassium: 4.4 mEq/L (ref 3.7–5.3)
Sodium: 138 mEq/L (ref 137–147)

## 2014-06-09 LAB — URINALYSIS, ROUTINE W REFLEX MICROSCOPIC
BILIRUBIN URINE: NEGATIVE
Glucose, UA: NEGATIVE mg/dL
Hgb urine dipstick: NEGATIVE
KETONES UR: NEGATIVE mg/dL
LEUKOCYTES UA: NEGATIVE
Nitrite: NEGATIVE
PH: 6.5 (ref 5.0–8.0)
PROTEIN: NEGATIVE mg/dL
Specific Gravity, Urine: 1.011 (ref 1.005–1.030)
Urobilinogen, UA: 0.2 mg/dL (ref 0.0–1.0)

## 2014-06-09 LAB — CBC
HCT: 32.7 % — ABNORMAL LOW (ref 36.0–46.0)
Hemoglobin: 10 g/dL — ABNORMAL LOW (ref 12.0–15.0)
MCH: 22.7 pg — ABNORMAL LOW (ref 26.0–34.0)
MCHC: 30.6 g/dL (ref 30.0–36.0)
MCV: 74.1 fL — AB (ref 78.0–100.0)
PLATELETS: 102 10*3/uL — AB (ref 150–400)
RBC: 4.41 MIL/uL (ref 3.87–5.11)
RDW: 14.4 % (ref 11.5–15.5)
WBC: 44.4 10*3/uL — AB (ref 4.0–10.5)

## 2014-06-09 MED ORDER — CALCITONIN (SALMON) 200 UNIT/ACT NA SOLN
1.0000 | Freq: Every day | NASAL | Status: DC
  Administered 2014-06-09 – 2014-06-10 (×2): 1 via NASAL
  Filled 2014-06-09 (×2): qty 3.7

## 2014-06-09 MED ORDER — HEPARIN SODIUM (PORCINE) 5000 UNIT/ML IJ SOLN
5000.0000 [IU] | Freq: Three times a day (TID) | INTRAMUSCULAR | Status: DC
  Administered 2014-06-09 – 2014-06-11 (×6): 5000 [IU] via SUBCUTANEOUS
  Filled 2014-06-09 (×6): qty 1

## 2014-06-09 MED ORDER — SIMETHICONE 80 MG PO CHEW
80.0000 mg | CHEWABLE_TABLET | ORAL | Status: DC | PRN
  Administered 2014-06-09 – 2014-06-11 (×2): 80 mg via ORAL
  Filled 2014-06-09 (×2): qty 1

## 2014-06-09 MED ORDER — SIMETHICONE 80 MG PO CHEW
80.0000 mg | CHEWABLE_TABLET | Freq: Every day | ORAL | Status: DC
  Administered 2014-06-09 – 2014-06-10 (×2): 80 mg via ORAL
  Filled 2014-06-09 (×3): qty 1

## 2014-06-09 MED ORDER — PREDNISONE 5 MG PO TABS
10.0000 mg | ORAL_TABLET | Freq: Every day | ORAL | Status: DC
  Administered 2014-06-09 – 2014-06-11 (×3): 10 mg via ORAL
  Filled 2014-06-09 (×3): qty 2

## 2014-06-09 MED ORDER — DOCUSATE SODIUM 100 MG PO CAPS
100.0000 mg | ORAL_CAPSULE | Freq: Every day | ORAL | Status: DC
  Administered 2014-06-10 – 2014-06-11 (×2): 100 mg via ORAL
  Filled 2014-06-09 (×2): qty 1

## 2014-06-09 MED ORDER — KCL IN DEXTROSE-NACL 30-5-0.45 MEQ/L-%-% IV SOLN
INTRAVENOUS | Status: DC
  Administered 2014-06-09: 01:00:00 via INTRAVENOUS
  Filled 2014-06-09 (×2): qty 1000

## 2014-06-09 MED ORDER — HYDROMORPHONE HCL 1 MG/ML IJ SOLN
0.5000 mg | INTRAMUSCULAR | Status: DC | PRN
  Administered 2014-06-09: 0.5 mg via INTRAVENOUS
  Filled 2014-06-09: qty 1

## 2014-06-09 MED ORDER — RISAQUAD PO CAPS
1.0000 | ORAL_CAPSULE | Freq: Every day | ORAL | Status: DC
  Administered 2014-06-09 – 2014-06-11 (×3): 1 via ORAL
  Filled 2014-06-09 (×3): qty 1

## 2014-06-09 MED ORDER — ALUM & MAG HYDROXIDE-SIMETH 200-200-20 MG/5ML PO SUSP
15.0000 mL | ORAL | Status: DC | PRN
  Administered 2014-06-09: 15 mL via ORAL
  Filled 2014-06-09: qty 30

## 2014-06-09 MED ORDER — GABAPENTIN 100 MG PO CAPS
100.0000 mg | ORAL_CAPSULE | Freq: Every day | ORAL | Status: DC
  Administered 2014-06-09 – 2014-06-11 (×3): 100 mg via ORAL
  Filled 2014-06-09 (×3): qty 1

## 2014-06-09 MED ORDER — PANTOPRAZOLE SODIUM 40 MG PO TBEC
40.0000 mg | DELAYED_RELEASE_TABLET | Freq: Every day | ORAL | Status: DC
  Administered 2014-06-09 – 2014-06-11 (×3): 40 mg via ORAL
  Filled 2014-06-09 (×3): qty 1

## 2014-06-09 MED ORDER — ONDANSETRON HCL 4 MG PO TABS: 4.0000 mg | ORAL_TABLET | Freq: Four times a day (QID) | ORAL | Status: DC | PRN

## 2014-06-09 MED ORDER — ONDANSETRON HCL 4 MG/2ML IJ SOLN: 4.0000 mg | Freq: Four times a day (QID) | INTRAMUSCULAR | Status: DC | PRN

## 2014-06-09 MED ORDER — SENNA 8.6 MG PO TABS
2.0000 | ORAL_TABLET | Freq: Every morning | ORAL | Status: DC
  Administered 2014-06-09 – 2014-06-11 (×3): 17.2 mg via ORAL
  Filled 2014-06-09 (×4): qty 2

## 2014-06-09 MED ORDER — ALBUTEROL SULFATE (2.5 MG/3ML) 0.083% IN NEBU: 2.5000 mg | INHALATION_SOLUTION | Freq: Four times a day (QID) | RESPIRATORY_TRACT | Status: DC | PRN

## 2014-06-09 MED ORDER — OXYCODONE HCL 5 MG PO TABS
5.0000 mg | ORAL_TABLET | Freq: Four times a day (QID) | ORAL | Status: DC | PRN
  Administered 2014-06-09 – 2014-06-11 (×5): 5 mg via ORAL
  Filled 2014-06-09 (×5): qty 1

## 2014-06-09 MED ORDER — ACETAMINOPHEN 325 MG PO TABS
650.0000 mg | ORAL_TABLET | Freq: Four times a day (QID) | ORAL | Status: DC
  Administered 2014-06-09 – 2014-06-11 (×8): 650 mg via ORAL
  Filled 2014-06-09 (×8): qty 2

## 2014-06-09 MED ORDER — FAMOTIDINE 20 MG PO TABS
20.0000 mg | ORAL_TABLET | Freq: Every day | ORAL | Status: DC
  Administered 2014-06-09 – 2014-06-11 (×3): 20 mg via ORAL
  Filled 2014-06-09 (×3): qty 1

## 2014-06-09 NOTE — Progress Notes (Signed)
Family Medicine Teaching Service Daily Progress Note Intern Pager: [phone]  Patient name: Stefanie Henry Medical record number: [id number] Date of birth: [birth year] Age: 80 y.o. Gender: female  Primary Care Provider: [name], MD Consultants: PT/OT Code Status: Full; considering DO NOT RESUSCITATE status  Pt Overview and Major Events to Date:  12/11: Admit for pain control for previous lumbar compression frx 05/29/14 12/12: PT/OT eval; Start Calcitonin  Assessment and Plan: Stefanie Henry is a 80 y.o. female presenting with lumbar compression fracture and pain control. PMH is significant for CLL, anemia, gallstones.   # Lumbar Pain: Due to compression fracture after fall on 05/29/14. On 10 mg prednisone daily. Results from lumbar x-rays and MRI for OSH should be being scanned in. Fall likely due to generalized weakness (CLL, anemia).  - Llumbar and hip xrays: Showed L1 compression frx  - PT/OT eval - Pain control: Tylenol scheduled; Oxy IR prn - Calcitonin: nasal spray daily  # Osteoporosis (No Dexa) but now with fragility fracture - Consider Bisphosphonates; Need to discuss pros/cons with patient  # CLL with leukocytosis, anemia and thrombocytopenia. previously followed by Dr [name] oncology; Last seen 01/25/13. S/p 1 cycle of chemotherapy with Treanda; did not tolerate this well and does not wish to continue chemotherapy. Splenomegaly. Hgb 11.5; appears to be slightly above baseline. Plt 87, WBC 71.9  - Continue prednisone 10 mg daily  # Gall stones: Evaluated by [facility] 11/27/48. Opted for medical management  - continue home medication: Beano, Pepcid, Prilosec, senna, probiotics   FEN/GI: SLIV (Monitor K; on Klor-Con at home) Reg diet Prophylax: Heparin (monitor plts)  Disposition: pending PT/OT evaluation / recommendation and pain control  Subjective:  Denies current back pain. Pain only with movement. No Nausea with pain meds  Objective: Temp:  [97.9 F (36.6  C)-98.6 F (37 C)] 97.9 F (36.6 C) (12/12 0643) Pulse Rate:  [71-91] 73 (12/12 0643) Resp:  [13-21] 16 (12/12 0643) BP: (96-143)/(42-84) 96/42 mmHg (12/12 0643) SpO2:  [94 %-100 %] 96 % (12/12 0643) Weight:  [128 lb 4.8 oz (58.196 kg)] 128 lb 4.8 oz (58.196 kg) (12/12 0043)  Physical Exam: General: Frail elderly female; NAD HEENT: MMM Cardiovascular: RRR, No m/r/g Respiratory: CTAB; normal respiratory effort Abdomen: SNTND; BS + Extremities: WWP; No edema Skin: No rash Back: Midline lumbar tenderness; mild paraspinal tenderness bilaterally with increase muscle tension Neuro: Lower extremity strength and sensation intact bilaterally; negative straight leg raise bilaterally  Laboratory:  Recent Labs Lab 06/08/14 1803 06/09/14 0545  WBC 71.9* 44.4*  HGB 11.5* 10.0*  HCT 36.1 32.7*  PLT 126* 102*    Recent Labs Lab 06/08/14 1803 06/09/14 0545  NA 134* 138  K 5.0 4.4  CL 95* 103  CO2 25 22  BUN 13 10  CREATININE 0.59 0.57  CALCIUM 9.3 8.4  PROT 6.3  --   BILITOT 0.3  --   ALKPHOS 138*  --   ALT 34  --   AST 18  --   GLUCOSE 126* 110*   Imaging/Diagnostic Tests: DG lumbar IMPRESSION: Mild spondylosis of the lumbar spine with disc disease at the L5-S1 level. No acute findings. Mild chronic L1 compression fracture.  DG Hips IMPRESSION: Degenerative changes in the hips. No acute bony abnormalities.  CT Abdomen 12/11 IMPRESSION: 1. No acute abnormalities involving the abdomen or pelvis. 2. Splenomegaly. No evidence of significant lymphadenopathy in this patient with a history of CLL. 3. Descending and sigmoid colon diverticulosis without evidence of acute diverticulitis. 4. Numerous  calcified, degenerating uterine fibroids. 5. Osseous findings as above.  Olam Idler, MD 06/09/2014, 8:08 AM PGY-2, [place] Intern pager: [phone], text pages welcome

## 2014-06-09 NOTE — Progress Notes (Signed)
Patient arrived from ED per stretcher, accompanied by family. Pt is alert and oriented X4. Pt oriented to room and equipment. Lower back pain of 10/10 reported. Medicated per orders. Will monitor closely overnight.

## 2014-06-09 NOTE — Evaluation (Signed)
Physical Therapy Evaluation Patient Details Name: Stefanie Henry MRN: [id number] DOB: 05/12/1930 Today's Date: 06/09/2014   History of Present Illness  Patient is an 80 yo elder female who sustained a fall while visiting her dau in [place].  She had subsequent L1 compression fx and has had pain and difficulty wtih mobility since her fall. She has been admitted for pain management.  Clinical Impression  Patient's mobility greatly limited by pain in her back, especially transfers and bed mobility. She was ambulating with QC prior to fall to manage bil knee pain although she was active.  She may now benefit from short RW to manage balance and pain.  Her dau had lumbar corset and patient has been using intermittently to assist with pain although it may be too large for patient. Patient's pain reduced while wearing back support and may benefit from short lumbar corset to further manage pain.  She may benefit from skilled therapy while on this level of care to address mobility and pain. Instructed in back precautions to manage pain. If she is able to negotiate stairs, she may benefit from [facility] therapies.    Follow Up Recommendations Home health PT;SNF;Supervision/Assistance - 24 hour    Equipment Recommendations  Rolling walker with 5" wheels;3in1 (PT) (needs petite walker)    Recommendations for Other Services OT consult     Precautions / Restrictions Precautions Precautions: Back Precaution Comments: instructed in back precautions to manage pain Required Braces or Orthoses:  (patient has lumbar corset from dau (fits large for pt)) Restrictions Weight Bearing Restrictions: No      Mobility  Bed Mobility Overal bed mobility: Needs Assistance Bed Mobility: Rolling;Sidelying to Sit;Supine to Sit Rolling: Min assist Sidelying to sit: Mod assist Supine to sit: Mod assist     General bed mobility comments: uses rail  Transfers Overall transfer level: Needs assistance Equipment used: Quad  cane;1 person hand held assist (left hand held assist (RW too tall for pt)) Transfers: Sit to/from Stand Sit to Stand: Min assist         General transfer comment: reliance on UE support  Ambulation/Gait Ambulation/Gait assistance: Min assist Ambulation Distance (Feet): 45 Feet Assistive device: Quad cane;1 person hand held assist (left hand held assist) Gait Pattern/deviations: Shuffle;Narrow base of support     General Gait Details: needs short RW-unable to find RW that is adequate for patient's size  Science writer    Modified Rankin (Stroke Patients Only)       Balance Overall balance assessment: Needs assistance Sitting-balance support: Bilateral upper extremity supported (to manage pain) Sitting balance-Leahy Scale: Fair     Standing balance support: Bilateral upper extremity supported Standing balance-Leahy Scale: Poor                               Pertinent Vitals/Pain Pain Assessment: 0-10 Pain Score: 8  Pain Location: back (incr with bed mobility and transfers) Pain Descriptors / Indicators: Constant;Jabbing;Sharp Pain Intervention(s): Limited activity within patient's tolerance;Monitored during session;Repositioned    Home Living Family/patient expects to be discharged to:: Private residence Living Arrangements: Children Available Help at Discharge: Family;Available PRN/intermittently (lives with son and dau in law. Son works evening shift) Type of Home: Apartment Home Access: Stairs to enter Entrance Stairs-Rails: Right Entrance Stairs-Number of Steps: [place]: One level Home Equipment: Cane - quad Additional Comments: used cane x 4-5 months to  manage pain Rt knee    Prior Function Level of Independence: Independent with assistive device(s)         Comments: would walk 1 block daily with cane prior to admission     Hand Dominance   Dominant Hand: Right    Extremity/Trunk Assessment    Upper Extremity Assessment: Overall WFL for tasks assessed           Lower Extremity Assessment: Generalized weakness      Cervical / Trunk Assessment: Normal  Communication   Communication: Prefers language other than [place];No difficulties  Cognition Arousal/Alertness: Awake/alert Behavior During Therapy: WFL for tasks assessed/performed Overall Cognitive Status: Within Functional Limits for tasks assessed                      General Comments      Exercises        Assessment/Plan    PT Assessment Patient needs continued PT services  PT Diagnosis Difficulty walking;Generalized weakness;Acute pain   PT Problem List Decreased strength;Decreased activity tolerance;Decreased balance;Decreased mobility;Decreased knowledge of use of DME;Pain  PT Treatment Interventions DME instruction;Gait training;Stair training;Functional mobility training;Therapeutic activities;Balance training;Neuromuscular re-education;Patient/family education   PT Goals (Current goals can be found in the Care Plan section) Acute Rehab PT Goals Patient Stated Goal: return home, decr pain PT Goal Formulation: With patient/family Time For Goal Achievement: 06/15/14 Potential to Achieve Goals: Good    Frequency Min 3X/week   Barriers to discharge Inaccessible home environment will be appropriate for HHPT if can negotiate stairs    Co-evaluation               End of Session Equipment Utilized During Treatment: Gait belt;Back brace Activity Tolerance: Patient limited by pain Patient left: in bed;with call bell/phone within reach;with family/visitor present           Time: 1630-1730 PT Time Calculation (min) (ACUTE ONLY): 60 min   Charges:   PT Evaluation $Initial PT Evaluation Tier I: 1 Procedure PT Treatments $Gait Training: 8-22 mins $Self Care/Home Management: [address] [place], [place]:          Jadd Gasior 06/09/2014, 6:38 PM

## 2014-06-09 NOTE — Plan of Care (Signed)
Problem: Acute Rehab PT Goals(only PT should resolve) Goal: Pt will Roll Supine to Side Min cues for back precautions to manage pain

## 2014-06-10 NOTE — Progress Notes (Signed)
Physical Therapy Treatment Patient Details Name: Stefanie Henry MRN: [id number] DOB: 1930-01-21 Today's Date: 06/10/2014    History of Present Illness Patient is an 80 yo elder female who sustained a fall while visiting her dau in [place].  She had subsequent L1 compression fx and has had pain and difficulty wtih mobility since her fall. She has been admitted for pain management.    PT Comments    Continues to be limited by back pain during therapy sessions. Only tolerates ambulating up to 30 feet today with min assist. Declines practicing stair training due to pain. Discussed d/c planning with daughter and patient (daughter wants a SNF where the family is allowed to stay with pt at night.) Recommend short term SNF placement for rehabilitation prior to returning home. If family/patient declines she will require HHPT, and probably a stretcher transfer via ambulance into house due to full flight of stairs to enter home.  Follow Up Recommendations  SNF;Supervision for mobility/OOB     Equipment Recommendations  Rolling walker with 5" wheels;3in1 (PT) (needs youth walker)    Recommendations for Other Services OT consult     Precautions / Restrictions Precautions Precautions: Back Precaution Comments: instructed in back precautions to manage pain Restrictions Weight Bearing Restrictions: No    Mobility  Bed Mobility Overal bed mobility: Needs Assistance Bed Mobility: Rolling;Sidelying to Sit Rolling: Min assist Sidelying to sit: Mod assist       General bed mobility comments: Min assist for log roll towards right side. Mod assist for LE support and truncal positioning to maintain neutral spine alignement and support into seated position.  Transfers Overall transfer level: Needs assistance Equipment used: Quad cane;1 person hand held assist Transfers: Sit to/from Stand Sit to Stand: Min assist         General transfer comment: Min assist for boost to stand. VC for hand  placement.   Ambulation/Gait Ambulation/Gait assistance: Min assist Ambulation Distance (Feet): 30 Feet Assistive device: Quad cane;1 person hand held assist Gait Pattern/deviations: Step-through pattern;Decreased stride length;Shuffle;Narrow base of support   Gait velocity interpretation: Below normal speed for age/gender General Gait Details: Very slow and guarded. Only able to tolerate 30 feet today due to pain. VC for forward gaze and upright posture while using quad cane for support. Min assist provided for balance with turns, otherwise min guard. Complains of pain in abdomen while ambulating.   Stairs Stairs:  (Pt unwilling to attempt due to pain)          Wheelchair Mobility    Modified Rankin (Stroke Patients Only)       Balance                                    Cognition Arousal/Alertness: Awake/alert Behavior During Therapy: WFL for tasks assessed/performed Overall Cognitive Status: Within Functional Limits for tasks assessed                      Exercises General Exercises - Lower Extremity Ankle Circles/Pumps: AROM;Both;10 reps;Seated Long Arc Quad: Strengthening;Both;10 reps;Seated    General Comments General comments (skin integrity, edema, etc.): Pts daughter was present during therapy session and is very supportive. We discussed d/c planning and daughter wants pt to have SNF placement if the facility allows family to stay with her at night.      Pertinent Vitals/Pain Pain Assessment: 0-10 Pain Score:  ("only bad when I move" no  value given) Pain Location: back Pain Intervention(s): Monitored during session;Premedicated before session;Repositioned;Limited activity within patient's tolerance    Home Living                      Prior Function            PT Goals (current goals can now be found in the care plan section) Acute Rehab PT Goals Patient Stated Goal: return home, decr pain PT Goal Formulation: With  patient/family Time For Goal Achievement: 06/15/14 Potential to Achieve Goals: Good Progress towards PT goals: Progressing toward goals    Frequency  Min 3X/week    PT Plan Discharge plan needs to be updated    Co-evaluation             End of Session Equipment Utilized During Treatment: Gait belt Activity Tolerance: Patient limited by pain Patient left: with call bell/phone within reach;with family/visitor present;in chair;with chair alarm set     Time: 3817-7116 PT Time Calculation (min) (ACUTE ONLY): 31 min  Charges:  $Gait Training: 8-22 mins $Therapeutic Activity: 8-22 mins                    G Codes:      Ellouise Newer Jul 06, 2014, 12:11 PM [place]ille [place], [place]

## 2014-06-10 NOTE — Progress Notes (Signed)
Occupational Therapy Evaluation Patient Details Name: Stefanie Henry MRN: [id number] DOB: [birth year] Today's Date: 06/10/2014    History of Present Illness Stefanie Henry is an 80 y.o. female admitted 06/08/14 for back injury sustained as a result of a fall when visiting her daughter in [place]. Imaging revealed L1 compression fx.   Clinical Impression   PTA pt was staying with her daughter in [place] and was independent with ADLs. Pt is limited by back pain and Bil LE weakness which impair her safety and independence. Pt has strong family support, however will need to be at Mod I level to return home as her DIL cannot provide 24/7 physical assistance (can provide supervision). Pt would be an excellent CIR candidate to return home at Mod I level. Pt will benefit from acute OT to address safety with ADLs and functional mobility.     Follow Up Recommendations  CIR;Supervision/Assistance - 24 hour    Equipment Recommendations  None recommended by OT    Recommendations for Other Services       Precautions / Restrictions Precautions Precautions: Back Precaution Comments: instructed in back precautions to manage pain Required Braces or Orthoses:  (pt has lumbar corset from daughter) Restrictions Weight Bearing Restrictions: No      Mobility Bed Mobility               General bed mobility comments: Pt ambulating in room with daughter when OT arrived (chair alarm going off).   Transfers Overall transfer level: Needs assistance Equipment used: Rolling walker (2 wheeled);Quad cane Transfers: Sit to/from Stand Sit to Stand: [employer] transfer comment: Min guard for sit<>stand with RW.          ADL Overall ADL's : Needs assistance/impaired Eating/Feeding: Independent;Sitting   Grooming: Set up;Sitting   Upper Body Bathing: Set up;Sitting   Lower Body Bathing: Sit to/from stand;Moderate assistance   Upper Body Dressing : Minimal assistance;Sitting   Lower  Body Dressing: Sit to/from stand;Maximal assistance   Toilet Transfer: Min guard;Ambulation;RW Toilet Transfer Details (indicate cue type and reason): sit<>stand from chair with youth RW. VC's for hand placement         Functional mobility during ADLs: Min guard;Rolling walker General ADL Comments: Pt lives with her son and her daughter is visiting from [place] until Tuesday. Pt will not have full Supervision at son's home due to his work schedule and DIL performing homecare and child rearing. Pt has weakness of Bil LEs which impairs her independence. Pt's back pain is limiting her independence with LB ADLs and functional mobility.      Vision  Pt reports no change.                    Perception Perception Perception Tested?: No   Praxis Praxis Praxis tested?: Within functional limits    Pertinent Vitals/Pain Pain Assessment: No/denies pain     Hand Dominance Right   Extremity/Trunk Assessment Upper Extremity Assessment Upper Extremity Assessment: Overall WFL for tasks assessed   Lower Extremity Assessment Lower Extremity Assessment: Generalized weakness   Cervical / Trunk Assessment Cervical / Trunk Assessment: Normal   Communication Communication Communication: Prefers language other than English (Hindi; able to communicate some in [place])   Cognition Arousal/Alertness: Awake/alert Behavior During Therapy: WFL for tasks assessed/performed Overall Cognitive Status: Within Functional Limits for tasks assessed  Home Living Family/patient expects to be discharged to:: Private residence Living Arrangements: Children (pt lives with son in [place]) Available Help at Discharge: Family;Available 24 hours/day (pt's son works 3rd shift. DIL does homemaking and cares for ) Type of Home: Apartment Home Access: Stairs to enter [employer] of Steps: 14 Entrance Stairs-Rails: Right Home Layout: One level          Biochemist, clinical: Standard     Home Equipment: Cane - quad   Additional Comments: used cane x 4-5 months to manage pain Rt knee      Prior Functioning/Environment Level of Independence: Independent with assistive device(s)        Comments: would walk 1 block daily with cane prior to admission    OT Diagnosis: Generalized weakness;Acute pain   OT Problem List: Decreased strength;Decreased range of motion;Decreased activity tolerance;Impaired balance (sitting and/or standing);Decreased safety awareness;Decreased knowledge of use of DME or AE;Decreased knowledge of precautions;Pain   OT Treatment/Interventions: Self-care/ADL training;Therapeutic exercise;Energy conservation;DME and/or AE instruction;Therapeutic activities;Patient/family education;Balance training    OT Goals(Current goals can be found in the care plan section) Acute Rehab OT Goals Patient Stated Goal: Rehab for a few days, then home with family support OT Goal Formulation: With patient/family Time For Goal Achievement: 06/24/14 Potential to Achieve Goals: Good ADL Goals Pt Will Perform Grooming: with modified independence;standing Pt Will Perform Lower Body Bathing: with modified independence;sit to/from stand Pt Will Perform Lower Body Dressing: with modified independence;sit to/from stand Pt Will Transfer to Toilet: with modified independence;ambulating Pt Will Perform Toileting - Clothing Manipulation and hygiene: with modified independence;sit to/from stand Pt Will Perform Tub/Shower Transfer: with modified independence;ambulating;shower seat  OT Frequency: Min 2X/week   Barriers to D/C: Inaccessible home environment       End of Session Equipment Utilized During Treatment: Gait belt;Rolling walker  Activity Tolerance: Patient tolerated treatment well Patient left: in chair;with call bell/phone within reach;with chair alarm set;with family/visitor present   Time: 1711-1735 OT Time Calculation (min):  24 min Charges:  OT General Charges $OT Visit: 1 Procedure OT Evaluation $Initial OT Evaluation Tier I: 1 Procedure OT Treatments $Self Care/Home Management : 8-22 mins  Juluis Rainier 06/10/2014, 6:57 PM  Secundino Ginger Lynetta Mare, OTR/L Occupational Therapist [phone] (pager)

## 2014-06-10 NOTE — Progress Notes (Signed)
Family Medicine Teaching Service Daily Progress Note Intern Pager: [phone]  Patient name: Stefanie Henry Medical record number: [id number] Date of birth: 1929/11/19 Age: 80 y.o. Gender: female  Primary Care Provider: [name], MD Consultants: PT/OT Code Status: Full; considering DO NOT RESUSCITATE status  Pt Overview and Major Events to Date:  12/11: Admit for pain control for previous lumbar compression frx 05/29/14 12/12: PT/OT eval; Start Calcitonin  Assessment and Plan: Stefanie Henry is a 80 y.o. female presenting with lumbar compression fracture and pain control. PMH is significant for CLL, anemia, gallstones.   # Lumbar Pain: Due to compression fracture after fall on 05/29/14. On 10 mg prednisone daily. Results from lumbar x-rays and MRI for OSH should be being scanned in. Fall likely due to generalized weakness (CLL, anemia).  - Lumbar and hip xrays: Showed L1 compression frx  - PT/OT: SNF vs HH.  Petite rolling walker. Patient request [facility] if possible.  C/s to CM & F2F order placed. - Pain control: Tylenol scheduled; Oxy IR prn - Calcitonin: nasal spray daily  # Osteoporosis (No Dexa) but now with fragility fracture - Consider Bisphosphonate outpatient.  # CLL with leukocytosis, anemia and thrombocytopenia. previously followed by Dr [name] oncology; Last seen 01/25/13. S/p 1 cycle of chemotherapy with Treanda; did not tolerate this well and does not wish to continue chemotherapy. Splenomegaly. Hgb 11.5; appears to be slightly above baseline. Plt 87, WBC 71.9  - Continue prednisone 10 mg daily  # Gall stones: Evaluated by [facility] 11/27/48. Opted for medical management  - continue home medication: Beano, Pepcid, Prilosec, senna, probiotics   FEN/GI: SLIV (Monitor K; on Klor-Con at home) Reg diet Prophylax: Heparin (monitor plts)  Disposition: pending SNF vs HH and pain control  Subjective:  Patient reports that she feels more stiff rather than pain in her back.   She denies CP, SOB, abdominal pain, n/v.  Daughter reports patient is eating/drinking/ voiding at baseline.  Last BM yesterday.  Patient endorses abdominal bloating/discomfort from heparin injections.  HH vs SNF discussed with patient and daughter.  They would like to keep patient at home with aid if possible, asking that we secure a female aid if possible.  I discussed with them that CM would try their best to accommodate request and asked them to discuss any particulars with CM.  Family voiced good understanding and great appreciation for care.  Objective: Temp:  [98.1 F (36.7 C)-98.5 F (36.9 C)] 98.3 F (36.8 C) (12/13 0655) Pulse Rate:  [58-78] 58 (12/13 0655) Resp:  [16-20] 16 (12/13 0655) BP: (113-128)/(56-73) 116/59 mmHg (12/13 0655) SpO2:  [95 %-98 %] 97 % (12/13 0655)  Physical Exam: General: Frail elderly female; NAD, RN and daughter at bedside HEENT:[place]/AT, EOMI, MMM Cardiovascular: RRR, No m/r/g Respiratory: CTAB; normal respiratory effort Abdomen: SNTND; BS + Extremities: WWP; No cyanosis, clubbing or edema, , +1 posterior tib pulses b/l Skin: No rash Back: Midline lumbar tenderness; mild paraspinal tenderness bilaterally with increase muscle tension, ROM limited d/t pain/stiffness Neuro: Lower extremity strength and sensation intact bilaterally; negative straight leg raise bilaterally  Laboratory:  Recent Labs Lab 06/08/14 1803 06/09/14 0545  WBC 71.9* 44.4*  HGB 11.5* 10.0*  HCT 36.1 32.7*  PLT 126* 102*    Recent Labs Lab 06/08/14 1803 06/09/14 0545  NA 134* 138  K 5.0 4.4  CL 95* 103  CO2 25 22  BUN 13 10  CREATININE 0.59 0.57  CALCIUM 9.3 8.4  PROT 6.3  --   BILITOT  0.3  --   ALKPHOS 138*  --   ALT 34  --   AST 18  --   GLUCOSE 126* 110*   Imaging/Diagnostic Tests: DG lumbar IMPRESSION: Mild spondylosis of the lumbar spine with disc disease at the L5-S1 level. No acute findings. Mild chronic L1 compression fracture.  DG  Hips IMPRESSION: Degenerative changes in the hips. No acute bony abnormalities.  CT Abdomen 12/11 IMPRESSION: 1. No acute abnormalities involving the abdomen or pelvis. 2. Splenomegaly. No evidence of significant lymphadenopathy in this patient with a history of CLL. 3. Descending and sigmoid colon diverticulosis without evidence of acute diverticulitis. 4. Numerous calcified, degenerating uterine fibroids. 5. Osseous findings as above.  Janora Norlander, DO 06/10/2014, 7:29 AM PGY-1, [facility] Intern pager: [phone], text pages welcome

## 2014-06-11 ENCOUNTER — Inpatient Hospital Stay (HOSPITAL_COMMUNITY)
Admission: RE | Admit: 2014-06-11 | Discharge: 2014-06-15 | DRG: 560 | Disposition: A | Discharge status: Home Health | Payer: Medicare Other | Source: Intra-hospital | Attending: Physical Medicine & Rehabilitation | Admitting: Physical Medicine & Rehabilitation

## 2014-06-11 DIAGNOSIS — J45909 Unspecified asthma, uncomplicated: Secondary | ICD-10-CM | POA: Diagnosis present

## 2014-06-11 DIAGNOSIS — S32000D Wedge compression fracture of unspecified lumbar vertebra, subsequent encounter for fracture with routine healing: Secondary | ICD-10-CM

## 2014-06-11 DIAGNOSIS — W19XXXS Unspecified fall, sequela: Secondary | ICD-10-CM | POA: Diagnosis not present

## 2014-06-11 DIAGNOSIS — C9110 Chronic lymphocytic leukemia of B-cell type not having achieved remission: Secondary | ICD-10-CM | POA: Diagnosis not present

## 2014-06-11 DIAGNOSIS — M62838 Other muscle spasm: Secondary | ICD-10-CM | POA: Diagnosis present

## 2014-06-11 DIAGNOSIS — M4856XD Collapsed vertebra, not elsewhere classified, lumbar region, subsequent encounter for fracture with routine healing: Secondary | ICD-10-CM | POA: Diagnosis present

## 2014-06-11 DIAGNOSIS — M545 Low back pain, unspecified: Secondary | ICD-10-CM | POA: Insufficient documentation

## 2014-06-11 DIAGNOSIS — S32000S Wedge compression fracture of unspecified lumbar vertebra, sequela: Secondary | ICD-10-CM

## 2014-06-11 DIAGNOSIS — K5900 Constipation, unspecified: Secondary | ICD-10-CM | POA: Diagnosis present

## 2014-06-11 DIAGNOSIS — D509 Iron deficiency anemia, unspecified: Secondary | ICD-10-CM | POA: Diagnosis present

## 2014-06-11 DIAGNOSIS — K219 Gastro-esophageal reflux disease without esophagitis: Secondary | ICD-10-CM | POA: Diagnosis present

## 2014-06-11 DIAGNOSIS — S32000A Wedge compression fracture of unspecified lumbar vertebra, initial encounter for closed fracture: Secondary | ICD-10-CM | POA: Diagnosis present

## 2014-06-11 DIAGNOSIS — W19XXXA Unspecified fall, initial encounter: Secondary | ICD-10-CM | POA: Diagnosis present

## 2014-06-11 LAB — BASIC METABOLIC PANEL
ANION GAP: 10 (ref 5–15)
BUN: 9 mg/dL (ref 6–23)
CALCIUM: 8.6 mg/dL (ref 8.4–10.5)
CO2: 28 mEq/L (ref 19–32)
Chloride: 100 mEq/L (ref 96–112)
Creatinine, Ser: 0.75 mg/dL (ref 0.50–1.10)
GFR, EST AFRICAN AMERICAN: 89 mL/min — AB (ref >90)
GFR, EST NON AFRICAN AMERICAN: 77 mL/min — AB (ref >90)
GLUCOSE: 111 mg/dL — AB (ref 70–99)
Potassium: 4.2 mEq/L (ref 3.7–5.3)
SODIUM: 138 meq/L (ref 137–147)

## 2014-06-11 LAB — CBC
HCT: 35.2 % — ABNORMAL LOW (ref 36.0–46.0)
HEMOGLOBIN: 10.9 g/dL — AB (ref 12.0–15.0)
MCH: 22.6 pg — AB (ref 26.0–34.0)
MCHC: 31 g/dL (ref 30.0–36.0)
MCV: 72.9 fL — ABNORMAL LOW (ref 78.0–100.0)
PLATELETS: 107 10*3/uL — AB (ref 150–400)
RBC: 4.83 MIL/uL (ref 3.87–5.11)
RDW: 14.5 % (ref 11.5–15.5)
WBC: 49.9 10*3/uL — ABNORMAL HIGH (ref 4.0–10.5)

## 2014-06-11 MED ORDER — RISAQUAD PO CAPS
1.0000 | ORAL_CAPSULE | Freq: Every day | ORAL | Status: DC
  Administered 2014-06-12 – 2014-06-15 (×4): 1 via ORAL
  Filled 2014-06-11 (×5): qty 1

## 2014-06-11 MED ORDER — SIMETHICONE 80 MG PO CHEW
80.0000 mg | CHEWABLE_TABLET | ORAL | Status: DC | PRN
  Filled 2014-06-11: qty 1

## 2014-06-11 MED ORDER — ACETAMINOPHEN 325 MG PO TABS
325.0000 mg | ORAL_TABLET | ORAL | Status: DC | PRN
  Administered 2014-06-12 – 2014-06-14 (×5): 650 mg via ORAL
  Filled 2014-06-11 (×6): qty 2

## 2014-06-11 MED ORDER — ONDANSETRON HCL 4 MG PO TABS: 4.0000 mg | ORAL_TABLET | Freq: Four times a day (QID) | ORAL | Status: DC | PRN

## 2014-06-11 MED ORDER — CALCITONIN (SALMON) 200 UNIT/ACT NA SOLN: 1.0000 | Freq: Every day | NASAL | Status: DC

## 2014-06-11 MED ORDER — HEPARIN SODIUM (PORCINE) 5000 UNIT/ML IJ SOLN
5000.0000 [IU] | Freq: Three times a day (TID) | INTRAMUSCULAR | Status: DC
  Administered 2014-06-11: 5000 [IU] via SUBCUTANEOUS
  Filled 2014-06-11 (×4): qty 1

## 2014-06-11 MED ORDER — ALBUTEROL SULFATE (2.5 MG/3ML) 0.083% IN NEBU
2.5000 mg | INHALATION_SOLUTION | Freq: Four times a day (QID) | RESPIRATORY_TRACT | Status: DC | PRN
  Administered 2014-06-13 – 2014-06-14 (×3): 2.5 mg via RESPIRATORY_TRACT
  Filled 2014-06-11 (×3): qty 3

## 2014-06-11 MED ORDER — ONDANSETRON HCL 4 MG/2ML IJ SOLN: 4.0000 mg | Freq: Four times a day (QID) | INTRAMUSCULAR | Status: DC | PRN

## 2014-06-11 MED ORDER — GABAPENTIN 100 MG PO CAPS
100.0000 mg | ORAL_CAPSULE | Freq: Every day | ORAL | Status: DC
  Administered 2014-06-12 – 2014-06-15 (×4): 100 mg via ORAL
  Filled 2014-06-11 (×5): qty 1

## 2014-06-11 MED ORDER — OXYCODONE HCL 5 MG PO TABS
5.0000 mg | ORAL_TABLET | ORAL | Status: DC | PRN
  Administered 2014-06-11: 5 mg via ORAL
  Filled 2014-06-11 (×2): qty 1

## 2014-06-11 MED ORDER — DOCUSATE SODIUM 100 MG PO CAPS
100.0000 mg | ORAL_CAPSULE | Freq: Every day | ORAL | Status: DC
  Administered 2014-06-12 – 2014-06-15 (×4): 100 mg via ORAL
  Filled 2014-06-11 (×5): qty 1

## 2014-06-11 MED ORDER — SIMETHICONE 80 MG PO CHEW
80.0000 mg | CHEWABLE_TABLET | Freq: Every day | ORAL | Status: DC
  Administered 2014-06-12 – 2014-06-15 (×4): 80 mg via ORAL
  Filled 2014-06-11 (×5): qty 1

## 2014-06-11 MED ORDER — CALCITONIN (SALMON) 200 UNIT/ACT NA SOLN
1.0000 | Freq: Every day | NASAL | Status: DC
  Administered 2014-06-12 – 2014-06-15 (×3): 1 via NASAL
  Filled 2014-06-11: qty 3.7

## 2014-06-11 MED ORDER — FAMOTIDINE 20 MG PO TABS
20.0000 mg | ORAL_TABLET | Freq: Every day | ORAL | Status: DC
  Administered 2014-06-12 – 2014-06-15 (×4): 20 mg via ORAL
  Filled 2014-06-11 (×5): qty 1

## 2014-06-11 MED ORDER — ALUM & MAG HYDROXIDE-SIMETH 200-200-20 MG/5ML PO SUSP: 15.0000 mL | ORAL | Status: DC | PRN

## 2014-06-11 MED ORDER — PREDNISONE 10 MG PO TABS
10.0000 mg | ORAL_TABLET | Freq: Every day | ORAL | Status: DC
  Administered 2014-06-12 – 2014-06-15 (×4): 10 mg via ORAL
  Filled 2014-06-11 (×5): qty 1

## 2014-06-11 MED ORDER — OXYCODONE HCL 5 MG PO TABS: 5.0000 mg | ORAL_TABLET | ORAL | Status: DC | PRN

## 2014-06-11 MED ORDER — OXYCODONE HCL 5 MG PO TABS: 5.0000 mg | ORAL_TABLET | Freq: Four times a day (QID) | ORAL | Status: DC | PRN

## 2014-06-11 MED ORDER — SENNA 8.6 MG PO TABS
2.0000 | ORAL_TABLET | Freq: Every morning | ORAL | Status: DC
  Administered 2014-06-12 – 2014-06-15 (×3): 17.2 mg via ORAL
  Filled 2014-06-11 (×5): qty 2

## 2014-06-11 MED ORDER — HEPARIN SODIUM (PORCINE) 5000 UNIT/ML IJ SOLN: 5000.0000 [IU] | Freq: Three times a day (TID) | INTRAMUSCULAR | Status: DC

## 2014-06-11 MED ORDER — OXYCODONE HCL 5 MG PO TABS: 5.0000 mg | ORAL_TABLET | Freq: Four times a day (QID) | ORAL | Status: DC

## 2014-06-11 MED ORDER — PANTOPRAZOLE SODIUM 40 MG PO TBEC
40.0000 mg | DELAYED_RELEASE_TABLET | Freq: Every day | ORAL | Status: DC
  Administered 2014-06-13 – 2014-06-15 (×3): 40 mg via ORAL
  Filled 2014-06-11 (×4): qty 1

## 2014-06-11 MED ORDER — SORBITOL 70 % SOLN: 30.0000 mL | Freq: Every day | Status: DC | PRN

## 2014-06-11 NOTE — Progress Notes (Signed)
Rehab admissions - Evaluated for possible admission.  I met with patient and her daughter.  They would like inpatient rehab admission.  Bed available and will admit to acute inpatient rehab today.  Call me for questions.  #[phone]

## 2014-06-11 NOTE — Clinical Social Work Placement (Signed)
Clinical Social Work Department CLINICAL SOCIAL WORK PLACEMENT NOTE 06/11/2014  Patient:  Stefanie Henry, Stefanie Henry  Account Number:  [id number] Admit date:  06/08/2014  Clinical Social Worker:  Glendon Axe, CLINICAL SOCIAL WORKER  Date/time:  06/11/2014 01:02 PM  Clinical Social Work is seeking post-discharge placement for this patient at the following level of care:   [place]   (*CSW will update this form in Epic as items are completed)   06/11/2014  Patient/family provided with [facility] Department of Clinical Social Work's list of facilities offering this level of care within the geographic area requested by the patient (or if unable, by the patient's family).  06/11/2014  Patient/family informed of their freedom to choose among providers that offer the needed level of care, that participate in Medicare, Medicaid or managed care program needed by the patient, have an available bed and are willing to accept the patient.  06/11/2014  Patient/family informed of [facility]' ownership interest in [facility], as well as of the fact that they are under no obligation to receive care at this facility.  PASARR submitted to EDS on 06/11/2014 PASARR number received on 06/11/2014  FL2 transmitted to all facilities in geographic area requested by pt/family on  06/11/2014 FL2 transmitted to all facilities within larger geographic area on   Patient informed that his/her managed care company has contracts with or will negotiate with  certain facilities, including the following:   YES.     Patient/family informed of bed offers received:   Patient chooses bed at  Physician recommends and patient chooses bed at    Patient to be transferred to  on   Patient to be transferred to facility by  Patient and family notified of transfer on  Name of family member notified:    The following physician request were entered in Epic:   Additional Comments:   Glendon Axe, MSW,  [place] [phone] 06/11/2014 1:03 PM

## 2014-06-11 NOTE — Progress Notes (Signed)
I have requested an interpreter for 8:30 to 11 am tomorrow.  However, it may be 9 am before interpreting services opens in the morning.  Call me for questions.  #[phone]

## 2014-06-11 NOTE — Progress Notes (Signed)
Physical Medicine and Rehabilitation Consult Reason for Consult: Lumbar compression fracture Referring Physician: Family medicine   HPI: Stefanie Henry is a 80 y.o. right handed female with history of CLL followed by oncology services Dr. [name], anemia, chronic abdominal pain. Presented 06/08/2014 after a fall 05/29/2014 while visiting her daughter in [place] with increased low back pain. Patient was independent prior to admission living with her son and daughter-in-law. Lumbar x-rays and MRI from outside hospital showed a L1 compression fracture. Hospital course conservative care with pain management. Subcutaneous heparin for DVT prophylaxis. Occupational therapy evaluation completed with recommendations of physical medicine rehabilitation consult.   Review of Systems  Gastrointestinal: Positive for constipation.   GERD  Musculoskeletal: Positive for myalgias and back pain.  All other systems reviewed and are negative.  Past Medical History  Diagnosis Date  . Asthma   . Arthritis   . Shingles   . Anemia, iron deficiency 06/06/2012  . Chronic lymphatic leukemia   . Gallstones    History reviewed. No pertinent past surgical history. Family History  Problem Relation Age of Onset  . Diabetes Father    Social History:  reports that she has never smoked. She has never used smokeless tobacco. She reports that she does not drink alcohol or use illicit drugs. Allergies:  Allergies  Allergen Reactions  . Contrast Media [Iodinated Diagnostic Agents] Other (See Comments)    Stomach discomfort  . Fruit & Vegetable Daily [Nutritional Supplements] Other (See Comments)    Citrus fruit causes stomach discomfort  . Ibuprofen Other (See Comments)    Stomach discomfort   Medications Prior to Admission  Medication Sig Dispense Refill  . ADVAIR DISKUS 250-50 MCG/DOSE AEPB Inhale 1 puff into the lungs as needed (for shortness  of breath).     Marland Kitchen albuterol (PROVENTIL HFA;VENTOLIN HFA) 108 (90 BASE) MCG/ACT inhaler Inhale 1-2 puffs into the lungs every 6 (six) hours as needed for wheezing or shortness of breath.    Marland Kitchen albuterol (PROVENTIL) (5 MG/ML) 0.5% nebulizer solution Take 2.5 mg by nebulization every 6 (six) hours as needed for shortness of breath.     . Alpha-D-Galactosidase (BEANO) TABS Take 1 tablet by mouth as needed (for bloating).     . Calcium & Magnesium Carbonates (MYLANTA PO) Take 1 tablet by mouth as needed (for indigestion).     . famotidine (PEPCID) 20 MG tablet Take 20 mg by mouth daily.    Marland Kitchen gabapentin (NEURONTIN) 100 MG capsule Take 100 mg by mouth daily.     Marland Kitchen omeprazole (PRILOSEC) 20 MG capsule Take 1 capsule (20 mg total) by mouth 2 (two) times daily before a meal. (Patient taking differently: Take 20 mg by mouth daily. ) 60 capsule 11  . Probiotic Product (PROBIOTIC DAILY PO) Take 1 capsule by mouth daily.    . Sennosides (SENNA) 15 MG TABS Take by mouth every morning.    . Simethicone (GAS-X PO) Take 2 capsules by mouth daily.     Marland Kitchen KLOR-CON M20 20 MEQ tablet TAKE 2 TABLETS TWICE A DAY FOR 3 DAYS THEN 2 TABLETS ONCE DAILY 60 tablet 4  . predniSONE (DELTASONE) 10 MG tablet TAKE 1 TABLET BY MOUTH EVERY DAY 30 tablet 0  . predniSONE (DELTASONE) 5 MG tablet Take 5 mg by mouth daily with breakfast.      Home: Home Living Family/patient expects to be discharged to:: Private residence Living Arrangements: Children (pt lives with son in [place]) Available Help at Discharge: Family, Available  24 hours/day (pt's son works 3rd shift. DIL does homemaking and cares for ) Type of Home: Apartment Home Access: Stairs to enter [employer] of Steps: 14 Entrance Stairs-Rails: Right Home Layout: One level Home Equipment: Cane - quad Additional Comments: used cane x 4-5 months to manage pain Rt knee  Functional History: Prior  Function Level of Independence: Independent with assistive device(s) Comments: would walk 1 block daily with cane prior to admission Functional Status:  Mobility: Bed Mobility Overal bed mobility: Needs Assistance Bed Mobility: Rolling, Sidelying to Sit Rolling: Min assist Sidelying to sit: Mod assist Supine to sit: Mod assist General bed mobility comments: Pt ambulating in room with daughter when OT arrived (chair alarm going off).  Transfers Overall transfer level: Needs assistance Equipment used: Rolling walker (2 wheeled), Quad cane Transfers: Sit to/from Stand Sit to Stand: Min guard General transfer comment: Min guard for sit<>stand with RW.  Ambulation/Gait Ambulation/Gait assistance: Min assist Ambulation Distance (Feet): 30 Feet Assistive device: Quad cane, 1 person hand held assist Gait Pattern/deviations: Step-through pattern, Decreased stride length, Shuffle, Narrow base of support Gait velocity interpretation: Below normal speed for age/gender General Gait Details: Very slow and guarded. Only able to tolerate 30 feet today due to pain. VC for forward gaze and upright posture while using quad cane for support. Min assist provided for balance with turns, otherwise min guard. Complains of pain in abdomen while ambulating. Stairs: (Pt unwilling to attempt due to pain)    ADL: ADL Overall ADL's : Needs assistance/impaired Eating/Feeding: Independent, Sitting Grooming: Set up, Sitting Upper Body Bathing: Set up, Sitting Lower Body Bathing: Sit to/from stand, Moderate assistance Upper Body Dressing : Minimal assistance, Sitting Lower Body Dressing: Sit to/from stand, Maximal assistance Toilet Transfer: Min guard, Ambulation, RW Toilet Transfer Details (indicate cue type and reason): sit<>stand from chair with youth RW. VC's for hand placement Functional mobility during ADLs: Min guard, Rolling walker General ADL Comments: Pt lives with her son and her daughter is  visiting from [place] until Tuesday. Pt will not have full Supervision at son's home due to his work schedule and DIL performing homecare and child rearing. Pt has weakness of Bil LEs which impairs her independence. Pt's back pain is limiting her independence with LB ADLs and functional mobility.   Cognition: Cognition Overall Cognitive Status: Within Functional Limits for tasks assessed Orientation Level: Oriented X4 Cognition Arousal/Alertness: Awake/alert Behavior During Therapy: WFL for tasks assessed/performed Overall Cognitive Status: Within Functional Limits for tasks assessed  Blood pressure 124/62, pulse 83, temperature 98.3 F (36.8 C), temperature source Oral, resp. rate 16, height 4\' 11"  (1.499 m), weight 58.196 kg (128 lb 4.8 oz), SpO2 98 %. Physical Exam  Constitutional: She appears well-developed and well-nourished.  Petite  HENT:  Head: Normocephalic and atraumatic.  Right Ear: External ear normal.  Left Ear: External ear normal.  Mouth/Throat: Oropharynx is clear and moist.  Poor dentition  Eyes: Conjunctivae and EOM are normal. Pupils are equal, round, and reactive to light.  Neck: Normal range of motion. Neck supple. No JVD present. No tracheal deviation present. No thyromegaly present.  Cardiovascular: Normal rate and regular rhythm. Exam reveals no gallop and no friction rub.  No murmur heard. Respiratory: Effort normal and breath sounds normal. No respiratory distress. She has no wheezes. She has no rales.  GI: Soft. Bowel sounds are normal. She exhibits no distension. There is no tenderness. There is no rebound.  Musculoskeletal:  Low back tender to palpation. Has difficulty with bed  mobility and use of hip girdle muscles do to pain  Lymphadenopathy:   She has no cervical adenopathy.  Neurological: She is alert. She displays normal reflexes. No cranial nerve deficit. She exhibits normal muscle tone.  Limited English speaking. Follows commands. UES: 4/5  proximal to distal. LE: HF 2, KE 4-, ADF/APF 4/5. No sensory deficits.  Skin: Skin is warm and dry.  Some tenderness to palpation lumbar spine  Psychiatric: She has a normal mood and affect. Her behavior is normal. Judgment and thought content normal.     Lab Results Last 24 Hours    Results for orders placed or performed during the hospital encounter of 06/08/14 (from the past 24 hour(s))  CBC Status: Abnormal   Collection Time: 06/11/14 8:30 AM  Result Value Ref Range   WBC 49.9 (H) 4.0 - 10.5 K/uL   RBC 4.83 3.87 - 5.11 MIL/uL   Hemoglobin 10.9 (L) 12.0 - 15.0 g/dL   HCT 35.2 (L) 36.0 - 46.0 %   MCV 72.9 (L) 78.0 - 100.0 fL   MCH 22.6 (L) 26.0 - 34.0 pg   MCHC 31.0 30.0 - 36.0 g/dL   RDW 14.5 11.5 - 15.5 %   Platelets 107 (L) 150 - 400 K/uL  Basic metabolic panel Status: Abnormal   Collection Time: 06/11/14 8:30 AM  Result Value Ref Range   Sodium 138 137 - 147 mEq/L   Potassium 4.2 3.7 - 5.3 mEq/L   Chloride 100 96 - 112 mEq/L   CO2 28 19 - 32 mEq/L   Glucose, Bld 111 (H) 70 - 99 mg/dL   BUN 9 6 - 23 mg/dL   Creatinine, Ser 0.75 0.50 - 1.10 mg/dL   Calcium 8.6 8.4 - 10.5 mg/dL   GFR calc non Af Amer 77 (L) >90 mL/min   GFR calc Af Amer 89 (L) >90 mL/min   Anion gap 10 5 - 15      Imaging Results (Last 48 hours)    No results found.    Assessment/Plan: Diagnosis: 80 yo female patient with CLL who sustained an L1 compression fracture after fall. 1. Does the need for close, 24 hr/day medical supervision in concert with the patient's rehab needs make it unreasonable for this patient to be served in a less intensive setting? Yes 2. Co-Morbidities requiring supervision/potential complications: cll, pain mgt, anemia 3. Due to bladder management, bowel management, safety, skin/wound care, disease management, medication administration, pain management and patient education,  does the patient require 24 hr/day rehab nursing? Yes 4. Does the patient require coordinated care of a physician, rehab nurse, PT (1-2 hrs/day, 5 days/week) and OT (1-2 hrs/day, 5 days/week) to address physical and functional deficits in the context of the above medical diagnosis(es)? Yes Addressing deficits in the following areas: balance, endurance, locomotion, strength, transferring, bowel/bladder control, bathing, dressing, feeding, grooming, toileting and psychosocial support 5. Can the patient actively participate in an intensive therapy program of at least 3 hrs of therapy per day at least 5 days per week? Yes 6. The potential for patient to make measurable gains while on inpatient rehab is excellent 7. Anticipated functional outcomes upon discharge from inpatient rehab are modified independent with PT, modified independent with OT, n/a with SLP. 8. Estimated rehab length of stay to reach the above functional goals is: 6-7 days 9. Does the patient have adequate social supports and living environment to accommodate these discharge functional goals? Yes 10. Anticipated D/C setting: Home 11. Anticipated post D/C treatments: HH therapy  and Outpatient therapy 12. Overall Rehab/Functional Prognosis: excellent  RECOMMENDATIONS: This patient's condition is appropriate for continued rehabilitative care in the following setting: CIR Patient has agreed to participate in recommended program. Yes Note that insurance prior authorization may be required for reimbursement for recommended care.  Comment: Pt has a full flight of stairs to get into apartment. Still having difficulty with transfers and med mobility as well. Would like to admit ASAP.  Meredith Staggers, MD, [facility] 06/11/2014     06/11/2014       Revision History

## 2014-06-11 NOTE — Consult Note (Signed)
Physical Medicine and Rehabilitation Consult Reason for Consult: Lumbar compression fracture Referring Physician: Family medicine   HPI: Stefanie Henry is a 80 y.o. right handed female with history of CLL followed by oncology services Dr. [name], anemia, chronic abdominal pain. Presented 06/08/2014 after a fall 05/29/2014 while visiting her daughter in [place] with increased low back pain. Patient was independent prior to admission living with her son and daughter-in-law. Lumbar x-rays and MRI from outside hospital showed a L1 compression fracture. Hospital course conservative care with pain management. Subcutaneous heparin for DVT prophylaxis. Occupational therapy evaluation completed with recommendations of physical medicine rehabilitation consult.   Review of Systems  Gastrointestinal: Positive for constipation.       GERD  Musculoskeletal: Positive for myalgias and back pain.  All other systems reviewed and are negative.  Past Medical History  Diagnosis Date  . Asthma   . Arthritis   . Shingles   . Anemia, iron deficiency 06/06/2012  . Chronic lymphatic leukemia   . Gallstones    History reviewed. No pertinent past surgical history. Family History  Problem Relation Age of Onset  . Diabetes Father    Social History:  reports that she has never smoked. She has never used smokeless tobacco. She reports that she does not drink alcohol or use illicit drugs. Allergies:  Allergies  Allergen Reactions  . Contrast Media [Iodinated Diagnostic Agents] Other (See Comments)    Stomach discomfort  . Fruit & Vegetable Daily [Nutritional Supplements] Other (See Comments)    Citrus fruit causes stomach discomfort  . Ibuprofen Other (See Comments)    Stomach discomfort   Medications Prior to Admission  Medication Sig Dispense Refill  . ADVAIR DISKUS 250-50 MCG/DOSE AEPB Inhale 1 puff into the lungs as needed (for shortness of breath).     Marland Kitchen albuterol (PROVENTIL HFA;VENTOLIN HFA)  108 (90 BASE) MCG/ACT inhaler Inhale 1-2 puffs into the lungs every 6 (six) hours as needed for wheezing or shortness of breath.    Marland Kitchen albuterol (PROVENTIL) (5 MG/ML) 0.5% nebulizer solution Take 2.5 mg by nebulization every 6 (six) hours as needed for shortness of breath.     . Alpha-D-Galactosidase (BEANO) TABS Take 1 tablet by mouth as needed (for bloating).     . Calcium & Magnesium Carbonates (MYLANTA PO) Take 1 tablet by mouth as needed (for indigestion).     . famotidine (PEPCID) 20 MG tablet Take 20 mg by mouth daily.    Marland Kitchen gabapentin (NEURONTIN) 100 MG capsule Take 100 mg by mouth daily.     Marland Kitchen omeprazole (PRILOSEC) 20 MG capsule Take 1 capsule (20 mg total) by mouth 2 (two) times daily before a meal. (Patient taking differently: Take 20 mg by mouth daily. ) 60 capsule 11  . Probiotic Product (PROBIOTIC DAILY PO) Take 1 capsule by mouth daily.    . Sennosides (SENNA) 15 MG TABS Take by mouth every morning.    . Simethicone (GAS-X PO) Take 2 capsules by mouth daily.     Marland Kitchen KLOR-CON M20 20 MEQ tablet TAKE 2 TABLETS TWICE A DAY FOR 3 DAYS THEN 2 TABLETS ONCE DAILY 60 tablet 4  . predniSONE (DELTASONE) 10 MG tablet TAKE 1 TABLET BY MOUTH EVERY DAY 30 tablet 0  . predniSONE (DELTASONE) 5 MG tablet Take 5 mg by mouth daily with breakfast.      Home: Home Living Family/patient expects to be discharged to:: Private residence Living Arrangements: Children (pt lives with son in [place]) Available Help  at Discharge: Family, Available 24 hours/day (pt's son works 3rd shift. DIL does homemaking and cares for ) Type of Home: Apartment Home Access: Stairs to enter [employer] of Steps: 14 Entrance Stairs-Rails: Right Home Layout: One level Home Equipment: Cane - quad Additional Comments: used cane x 4-5 months to manage pain Rt knee  Functional History: Prior Function Level of Independence: Independent with assistive device(s) Comments: would walk 1 block daily with cane prior to  admission Functional Status:  Mobility: Bed Mobility Overal bed mobility: Needs Assistance Bed Mobility: Rolling, Sidelying to Sit Rolling: Min assist Sidelying to sit: Mod assist Supine to sit: Mod assist General bed mobility comments: Pt ambulating in room with daughter when OT arrived (chair alarm going off).  Transfers Overall transfer level: Needs assistance Equipment used: Rolling walker (2 wheeled), Quad cane Transfers: Sit to/from Stand Sit to Stand: Min guard General transfer comment: Min guard for sit<>stand with RW.  Ambulation/Gait Ambulation/Gait assistance: Min assist Ambulation Distance (Feet): 30 Feet Assistive device: Quad cane, 1 person hand held assist Gait Pattern/deviations: Step-through pattern, Decreased stride length, Shuffle, Narrow base of support Gait velocity interpretation: Below normal speed for age/gender General Gait Details: Very slow and guarded. Only able to tolerate 30 feet today due to pain. VC for forward gaze and upright posture while using quad cane for support. Min assist provided for balance with turns, otherwise min guard. Complains of pain in abdomen while ambulating. Stairs:  (Pt unwilling to attempt due to pain)    ADL: ADL Overall ADL's : Needs assistance/impaired Eating/Feeding: Independent, Sitting Grooming: Set up, Sitting Upper Body Bathing: Set up, Sitting Lower Body Bathing: Sit to/from stand, Moderate assistance Upper Body Dressing : Minimal assistance, Sitting Lower Body Dressing: Sit to/from stand, Maximal assistance Toilet Transfer: Min guard, Ambulation, RW Toilet Transfer Details (indicate cue type and reason): sit<>stand from chair with youth RW. VC's for hand placement Functional mobility during ADLs: Min guard, Rolling walker General ADL Comments: Pt lives with her son and her daughter is visiting from [place] until Tuesday. Pt will not have full Supervision at son's home due to his work schedule and DIL performing  homecare and child rearing. Pt has weakness of Bil LEs which impairs her independence. Pt's back pain is limiting her independence with LB ADLs and functional mobility.   Cognition: Cognition Overall Cognitive Status: Within Functional Limits for tasks assessed Orientation Level: Oriented X4 Cognition Arousal/Alertness: Awake/alert Behavior During Therapy: WFL for tasks assessed/performed Overall Cognitive Status: Within Functional Limits for tasks assessed  Blood pressure 124/62, pulse 83, temperature 98.3 F (36.8 C), temperature source Oral, resp. rate 16, height 4\' 11"  (1.499 m), weight 58.196 kg (128 lb 4.8 oz), SpO2 98 %. Physical Exam  Constitutional: She appears well-developed and well-nourished.  Petite  HENT:  Head: Normocephalic and atraumatic.  Right Ear: External ear normal.  Left Ear: External ear normal.  Mouth/Throat: Oropharynx is clear and moist.  Poor dentition  Eyes: Conjunctivae and EOM are normal. Pupils are equal, round, and reactive to light.  Neck: Normal range of motion. Neck supple. No JVD present. No tracheal deviation present. No thyromegaly present.  Cardiovascular: Normal rate and regular rhythm.  Exam reveals no gallop and no friction rub.   No murmur heard. Respiratory: Effort normal and breath sounds normal. No respiratory distress. She has no wheezes. She has no rales.  GI: Soft. Bowel sounds are normal. She exhibits no distension. There is no tenderness. There is no rebound.  Musculoskeletal:  Low back  tender to palpation. Has difficulty with bed mobility and use of hip girdle muscles do to pain  Lymphadenopathy:    She has no cervical adenopathy.  Neurological: She is alert. She displays normal reflexes. No cranial nerve deficit. She exhibits normal muscle tone.  Limited English speaking. Follows commands. UES: 4/5 proximal to distal. LE: HF 2, KE 4-, ADF/APF 4/5. No sensory deficits.   Skin: Skin is warm and dry.  Some tenderness to palpation  lumbar spine  Psychiatric: She has a normal mood and affect. Her behavior is normal. Judgment and thought content normal.    Results for orders placed or performed during the hospital encounter of 06/08/14 (from the past 24 hour(s))  CBC     Status: Abnormal   Collection Time: 06/11/14  8:30 AM  Result Value Ref Range   WBC 49.9 (H) 4.0 - 10.5 K/uL   RBC 4.83 3.87 - 5.11 MIL/uL   Hemoglobin 10.9 (L) 12.0 - 15.0 g/dL   HCT 35.2 (L) 36.0 - 46.0 %   MCV 72.9 (L) 78.0 - 100.0 fL   MCH 22.6 (L) 26.0 - 34.0 pg   MCHC 31.0 30.0 - 36.0 g/dL   RDW 14.5 11.5 - 15.5 %   Platelets 107 (L) 150 - 400 K/uL  Basic metabolic panel     Status: Abnormal   Collection Time: 06/11/14  8:30 AM  Result Value Ref Range   Sodium 138 137 - 147 mEq/L   Potassium 4.2 3.7 - 5.3 mEq/L   Chloride 100 96 - 112 mEq/L   CO2 28 19 - 32 mEq/L   Glucose, Bld 111 (H) 70 - 99 mg/dL   BUN 9 6 - 23 mg/dL   Creatinine, Ser 0.75 0.50 - 1.10 mg/dL   Calcium 8.6 8.4 - 10.5 mg/dL   GFR calc non Af Amer 77 (L) >90 mL/min   GFR calc Af Amer 89 (L) >90 mL/min   Anion gap 10 5 - 15   No results found.  Assessment/Plan: Diagnosis: 80 yo female patient with CLL who sustained an L1 compression fracture after fall. 1. Does the need for close, 24 hr/day medical supervision in concert with the patient's rehab needs make it unreasonable for this patient to be served in a less intensive setting? Yes 2. Co-Morbidities requiring supervision/potential complications: cll, pain mgt, anemia 3. Due to bladder management, bowel management, safety, skin/wound care, disease management, medication administration, pain management and patient education, does the patient require 24 hr/day rehab nursing? Yes 4. Does the patient require coordinated care of a physician, rehab nurse, PT (1-2 hrs/day, 5 days/week) and OT (1-2 hrs/day, 5 days/week) to address physical and functional deficits in the context of the above medical diagnosis(es)?  Yes Addressing deficits in the following areas: balance, endurance, locomotion, strength, transferring, bowel/bladder control, bathing, dressing, feeding, grooming, toileting and psychosocial support 5. Can the patient actively participate in an intensive therapy program of at least 3 hrs of therapy per day at least 5 days per week? Yes 6. The potential for patient to make measurable gains while on inpatient rehab is excellent 7. Anticipated functional outcomes upon discharge from inpatient rehab are modified independent  with PT, modified independent with OT, n/a with SLP. 8. Estimated rehab length of stay to reach the above functional goals is: 6-7 days 9. Does the patient have adequate social supports and living environment to accommodate these discharge functional goals? Yes 10. Anticipated D/C setting: Home 11. Anticipated post D/C treatments: HH therapy and Outpatient  therapy 12. Overall Rehab/Functional Prognosis: excellent  RECOMMENDATIONS: This patient's condition is appropriate for continued rehabilitative care in the following setting: CIR Patient has agreed to participate in recommended program. Yes Note that insurance prior authorization may be required for reimbursement for recommended care.  Comment: Pt has a full flight of stairs to get into apartment. Still having difficulty with transfers and med mobility as well. Would like to admit ASAP.  Meredith Staggers, MD, [facility] 06/11/2014     06/11/2014

## 2014-06-11 NOTE — Progress Notes (Signed)
Rehab Admissions Coordinator Note:  Patient was screened by Stefanie Henry for appropriateness for an Inpatient Acute Rehab Consult.  At this time, we are recommending Inpatient Rehab consult.  Jodell Cipro M 06/11/2014, 11:14 AM  I can be reached at [phone].

## 2014-06-11 NOTE — H&P (View-Only) (Signed)
Physical Medicine and Rehabilitation Admission H&P    Chief Complaint  Patient presents with  . Back Pain  : HPI: Stefanie Henry is a 80 y.o. right handed female with history of CLL followed by oncology services Dr. [name], anemia, chronic abdominal pain. Presented 06/08/2014 after a fall 05/29/2014 while visiting her daughter in [place] with increased low back pain. Patient was independent prior to admission living with her son and daughter-in-law. Lumbar x-rays and MRI from outside hospital showed a L1 compression fracture. Hospital course conservative care with pain management. Subcutaneous heparin for DVT prophylaxis. Occupational therapy evaluation completed with recommendations of physical medicine rehabilitation consult. Patient was admitted for comprehensive rehabilitation program  ROS Review of Systems  Gastrointestinal: Positive for constipation.   GERD  Musculoskeletal: Positive for myalgias and back pain.  All other systems reviewed and are negative   Past Medical History  Diagnosis Date  . Asthma   . Arthritis   . Shingles   . Anemia, iron deficiency 06/06/2012  . Chronic lymphatic leukemia   . Gallstones    History reviewed. No pertinent past surgical history. Family History  Problem Relation Age of Onset  . Diabetes Father    Social History:  reports that she has never smoked. She has never used smokeless tobacco. She reports that she does not drink alcohol or use illicit drugs. Allergies:  Allergies  Allergen Reactions  . Contrast Media [Iodinated Diagnostic Agents] Other (See Comments)    Stomach discomfort  . Fruit & Vegetable Daily [Nutritional Supplements] Other (See Comments)    Citrus fruit causes stomach discomfort  . Ibuprofen Other (See Comments)    Stomach discomfort   Medications Prior to Admission  Medication Sig Dispense Refill  . ADVAIR DISKUS 250-50 MCG/DOSE AEPB Inhale 1 puff into the lungs as needed (for shortness of breath).       Marland Kitchen albuterol (PROVENTIL HFA;VENTOLIN HFA) 108 (90 BASE) MCG/ACT inhaler Inhale 1-2 puffs into the lungs every 6 (six) hours as needed for wheezing or shortness of breath.    Marland Kitchen albuterol (PROVENTIL) (5 MG/ML) 0.5% nebulizer solution Take 2.5 mg by nebulization every 6 (six) hours as needed for shortness of breath.     . Alpha-D-Galactosidase (BEANO) TABS Take 1 tablet by mouth as needed (for bloating).     . Calcium & Magnesium Carbonates (MYLANTA PO) Take 1 tablet by mouth as needed (for indigestion).     . famotidine (PEPCID) 20 MG tablet Take 20 mg by mouth daily.    Marland Kitchen gabapentin (NEURONTIN) 100 MG capsule Take 100 mg by mouth daily.     Marland Kitchen omeprazole (PRILOSEC) 20 MG capsule Take 1 capsule (20 mg total) by mouth 2 (two) times daily before a meal. (Patient taking differently: Take 20 mg by mouth daily. ) 60 capsule 11  . Probiotic Product (PROBIOTIC DAILY PO) Take 1 capsule by mouth daily.    . Sennosides (SENNA) 15 MG TABS Take by mouth every morning.    . Simethicone (GAS-X PO) Take 2 capsules by mouth daily.     Marland Kitchen KLOR-CON M20 20 MEQ tablet TAKE 2 TABLETS TWICE A DAY FOR 3 DAYS THEN 2 TABLETS ONCE DAILY 60 tablet 4  . predniSONE (DELTASONE) 10 MG tablet TAKE 1 TABLET BY MOUTH EVERY DAY 30 tablet 0  . predniSONE (DELTASONE) 5 MG tablet Take 5 mg by mouth daily with breakfast.      Home: Home Living Family/patient expects to be discharged to:: Private residence Living Arrangements: Children (pt  lives with son in [place]) Available Help at Discharge: Family, Available 24 hours/day (pt's son works 3rd shift. DIL does homemaking and cares for ) Type of Home: Apartment Home Access: Stairs to enter [employer] of Steps: 14 Entrance Stairs-Rails: Right Home Layout: One level Home Equipment: Cane - quad Additional Comments: used cane x 4-5 months to manage pain Rt knee   Functional History: Prior Function Level of Independence: Independent with assistive device(s) Comments:  would walk 1 block daily with cane prior to admission  Functional Status:  Mobility: Bed Mobility Overal bed mobility: Needs Assistance Bed Mobility: Rolling, Sidelying to Sit Rolling: Min assist Sidelying to sit: Mod assist Supine to sit: Mod assist General bed mobility comments: Pt ambulating in room with daughter when OT arrived (chair alarm going off).  Transfers Overall transfer level: Needs assistance Equipment used: Rolling walker (2 wheeled), Quad cane Transfers: Sit to/from Stand Sit to Stand: Min guard General transfer comment: Min guard for sit<>stand with RW.  Ambulation/Gait Ambulation/Gait assistance: Min assist Ambulation Distance (Feet): 30 Feet Assistive device: Quad cane, 1 person hand held assist Gait Pattern/deviations: Step-through pattern, Decreased stride length, Shuffle, Narrow base of support Gait velocity interpretation: Below normal speed for age/gender General Gait Details: Very slow and guarded. Only able to tolerate 30 feet today due to pain. VC for forward gaze and upright posture while using quad cane for support. Min assist provided for balance with turns, otherwise min guard. Complains of pain in abdomen while ambulating. Stairs:  (Pt unwilling to attempt due to pain)    ADL: ADL Overall ADL's : Needs assistance/impaired Eating/Feeding: Independent, Sitting Grooming: Set up, Sitting Upper Body Bathing: Set up, Sitting Lower Body Bathing: Sit to/from stand, Moderate assistance Upper Body Dressing : Minimal assistance, Sitting Lower Body Dressing: Sit to/from stand, Maximal assistance Toilet Transfer: Min guard, Ambulation, RW Toilet Transfer Details (indicate cue type and reason): sit<>stand from chair with youth RW. VC's for hand placement Functional mobility during ADLs: Min guard, Rolling walker General ADL Comments: Pt lives with her son and her daughter is visiting from [place] until Tuesday. Pt will not have full Supervision at son's home due  to his work schedule and DIL performing homecare and child rearing. Pt has weakness of Bil LEs which impairs her independence. Pt's back pain is limiting her independence with LB ADLs and functional mobility.   Cognition: Cognition Overall Cognitive Status: Within Functional Limits for tasks assessed Orientation Level: Oriented X4 Cognition Arousal/Alertness: Awake/alert Behavior During Therapy: WFL for tasks assessed/performed Overall Cognitive Status: Within Functional Limits for tasks assessed  Physical Exam: Blood pressure 124/62, pulse 83, temperature 98.3 F (36.8 C), temperature source Oral, resp. rate 16, height $RemoveBe'4\' 11"'iDTcmQiSA$  (1.499 m), weight 58.196 kg (128 lb 4.8 oz), SpO2 98 %. Physical Exam Constitutional: She appears well-developed and well-nourished.  Petite  HENT:  Head: Normocephalic and atraumatic.  Right Ear: External ear normal.  Left Ear: External ear normal.  Mouth/Throat: Oropharynx is clear and moist.  Poor dentition  Eyes: Conjunctivae and EOM are normal. Pupils are equal, round, and reactive to light.  Neck: Normal range of motion. Neck supple. No JVD present. No tracheal deviation present. No thyromegaly present.  Cardiovascular: Normal rate and regular rhythm. Exam reveals no gallop and no friction rub.  No murmur heard. Respiratory: Effort normal and breath sounds normal. No respiratory distress. She has no wheezes. She has no rales.  GI: Soft. Bowel sounds are normal. She exhibits no distension. There is no tenderness. There  is no rebound.  Musculoskeletal:  Low back tender to palpation. Has difficulty with bed mobility and use of hip girdle muscles do to pain  Lymphadenopathy:   She has no cervical adenopathy.  Neurological: She is alert. She displays normal reflexes. No cranial nerve deficit. She exhibits normal muscle tone.  Limited English speaking. Follows commands. UES: 4/5 proximal to distal. LE: HF 2, KE 4-, ADF/APF 4/5. No sensory deficits.    Skin: Skin is warm and dry.  Some tenderness to palpation lumbar spine  Psychiatric: She has a normal mood and affect. Her behavior is normal. Judgment and thought content normal Results for orders placed or performed during the hospital encounter of 06/08/14 (from the past 48 hour(s))  Urinalysis, Routine w reflex microscopic     Status: None   Collection Time: 06/09/14 10:27 PM  Result Value Ref Range   Color, Urine YELLOW YELLOW   APPearance CLEAR CLEAR   Specific Gravity, Urine 1.011 1.005 - 1.030   pH 6.5 5.0 - 8.0   Glucose, UA NEGATIVE NEGATIVE mg/dL   Hgb urine dipstick NEGATIVE NEGATIVE   Bilirubin Urine NEGATIVE NEGATIVE   Ketones, ur NEGATIVE NEGATIVE mg/dL   Protein, ur NEGATIVE NEGATIVE mg/dL   Urobilinogen, UA 0.2 0.0 - 1.0 mg/dL   Nitrite NEGATIVE NEGATIVE   Leukocytes, UA NEGATIVE NEGATIVE    Comment: MICROSCOPIC NOT DONE ON URINES WITH NEGATIVE PROTEIN, BLOOD, LEUKOCYTES, NITRITE, OR GLUCOSE <1000 mg/dL.  CBC     Status: Abnormal   Collection Time: 06/11/14  8:30 AM  Result Value Ref Range   WBC 49.9 (H) 4.0 - 10.5 K/uL   RBC 4.83 3.87 - 5.11 MIL/uL   Hemoglobin 10.9 (L) 12.0 - 15.0 g/dL   HCT 35.2 (L) 36.0 - 46.0 %   MCV 72.9 (L) 78.0 - 100.0 fL   MCH 22.6 (L) 26.0 - 34.0 pg   MCHC 31.0 30.0 - 36.0 g/dL   RDW 14.5 11.5 - 15.5 %   Platelets 107 (L) 150 - 400 K/uL    Comment: CONSISTENT WITH PREVIOUS RESULT  Basic metabolic panel     Status: Abnormal   Collection Time: 06/11/14  8:30 AM  Result Value Ref Range   Sodium 138 137 - 147 mEq/L   Potassium 4.2 3.7 - 5.3 mEq/L   Chloride 100 96 - 112 mEq/L   CO2 28 19 - 32 mEq/L   Glucose, Bld 111 (H) 70 - 99 mg/dL   BUN 9 6 - 23 mg/dL   Creatinine, Ser 0.75 0.50 - 1.10 mg/dL   Calcium 8.6 8.4 - 10.5 mg/dL   GFR calc non Af Amer 77 (L) >90 mL/min   GFR calc Af Amer 89 (L) >90 mL/min    Comment: (NOTE) The eGFR has been calculated using the CKD EPI equation. This calculation has not been validated in all  clinical situations. eGFR's persistently <90 mL/min signify possible Chronic Kidney Disease.    Anion gap 10 5 - 15   No results found.     Medical Problem List and Plan: 1. Functional deficits secondary to L1 compression fracture after fall 2.  DVT Prophylaxis/Anticoagulation: Subcutaneous heparin. Monitor platelet counts and any signs of bleeding 3. Pain Management: Neurontin 100 mg daily, Oxycodone as needed. 4. CLL. Follow-up oncology services 5. Neuropsych: This patient is capable of making decisions on her own behalf. 6. Skin/Wound Care: Routine skin checks 7. Fluids/Electrolytes/Nutrition: Strict I and O with follow-up chemistries 8. Iron deficiency anemia. Follow-up labs 9. GERD. Protonix  Post Admission Physician Evaluation: 1. Functional deficits secondary  to L1 compression fracture after fall. 2. Patient is admitted to receive collaborative, interdisciplinary care between the physiatrist, rehab nursing staff, and therapy team. 3. Patient's level of medical complexity and substantial therapy needs in context of that medical necessity cannot be provided at a lesser intensity of care such as a SNF. 4. Patient has experienced substantial functional loss from his/her baseline which was documented above under the "Functional History" and "Functional Status" headings.  Judging by the patient's diagnosis, physical exam, and functional history, the patient has potential for functional progress which will result in measurable gains while on inpatient rehab.  These gains will be of substantial and practical use upon discharge  in facilitating mobility and self-care at the household level. 5. Physiatrist will provide 24 hour management of medical needs as well as oversight of the therapy plan/treatment and provide guidance as appropriate regarding the interaction of the two. 6. 24 hour rehab nursing will assist with bladder management, bowel management, safety, skin/wound care,  disease management, medication administration, pain management and patient education  and help integrate therapy concepts, techniques,education, etc. 7. PT will assess and treat for/with: Lower extremity strength, range of motion, stamina, balance, functional mobility, safety, adaptive techniques and equipment, pain mgt, back precautions, brace don/doff, stair climbing.   Goals are: mod I. 8. OT will assess and treat for/with: ADL's, functional mobility, safety, upper extremity strength, adaptive techniques and equipment, pain mgt, back precautions, activity tolerance.   Goals are: mod I to supervision. Therapy may proceed with showering this patient. 9. SLP will assess and treat for/with: n/a.  Goals are: n/a. 10. Case Management and Social Worker will assess and treat for psychological issues and discharge planning. 11. Team conference will be held weekly to assess progress toward goals and to determine barriers to discharge. 12. Patient will receive at least 3 hours of therapy per day at least 5 days per week. 13. ELOS: 6-7 days       14. Prognosis:  excellent     Meredith Staggers, MD, [facility] 06/11/2014   06/11/2014

## 2014-06-11 NOTE — Discharge Instructions (Signed)
You were admitted for a compression fracture in your back. While here you received pain medications and saw our physical therapist. Stefanie Henry will be discharged to the inpatient rehab facility. It is important that you follow up with your primary doctor.  Back, Compression Fracture A compression fracture happens when a force is put upon the length of your spine. Slipping and falling on your bottom are examples of such a force. When this happens, sometimes the force is great enough to compress the building blocks (vertebral bodies) of your spine. Although this causes a lot of pain, this can usually be treated at home, unless your caregiver feels hospitalization is needed for pain control. Your backbone (spinal column) is made up of 24 main vertebral bodies in addition to the sacrum and coccyx (see illustration). These are held together by tough fibrous tissues (ligaments) and by support of your muscles. Nerve roots pass through the openings between the vertebrae. A sudden wrenching move, injury, or a fall may cause a compression fracture of one of the vertebral bodies. This may result in back pain or spread of pain into the belly (abdomen), the buttocks, and down the leg into the foot. Pain may also be created by muscle spasm alone. Large studies have been undertaken to determine the best possible course of action to help your back following injury and also to prevent future problems. The recommendations are as follows. FOLLOWING A COMPRESSION FRACTURE: Do the following only if advised by your caregiver.   If a back brace has been suggested or provided, wear it as directed.  Do not stop wearing the back brace unless instructed by your caregiver.  When allowed to return to regular activities, avoid a sedentary lifestyle. Actively exercise. Sporadic weekend binges of tennis, racquetball, or waterskiing may actually aggravate or create problems, especially if you are not in condition for that activity.  Avoid  sports requiring sudden body movements until you are in condition for them. Swimming and walking are safer activities.  Maintain good posture.  Avoid obesity.  If not already done, you should have a DEXA scan. Based on the results, be treated for osteoporosis. FOLLOWING ACUTE (SUDDEN) INJURY:  Only take over-the-counter or prescription medicines for pain, discomfort, or fever as directed by your caregiver.  Use bed rest for only the most extreme acute episode. Prolonged bed rest may aggravate your condition. Ice used for acute conditions is effective. Use a large plastic bag filled with ice. Wrap it in a towel. This also provides excellent pain relief. This may be continuous. Or use it for 30 minutes every 2 hours during acute phase, then as needed. Heat for 30 minutes prior to activities is helpful.  As soon as the acute phase (the time when your back is too painful for you to do normal activities) is over, it is important to resume normal activities and work [employer] information centre manager. Back injuries can cause potentially marked changes in lifestyle. So it is important to attack these problems aggressively.  See your caregiver for continued problems. He or she can help or refer you for appropriate exercises, physical therapy, and work hardening if needed.  If you are given narcotic medications for your condition, for the next 24 hours do not:  Drive.  Operate machinery or power tools.  Sign legal documents.  Do not drink alcohol, or take sleeping pills or other medications that may interfere with treatment. If your caregiver has given you a follow-up appointment, it is very important to keep that appointment.  Not keeping the appointment could result in a chronic or permanent injury, pain, and disability. If there is any problem keeping the appointment, you must call back to this facility for assistance.  SEEK IMMEDIATE MEDICAL CARE IF:  You develop numbness, tingling, weakness, or problems with  the use of your arms or legs.  You develop severe back pain not relieved with medications.  You have changes in bowel or bladder control.  You have increasing pain in any areas of the body. Document Released: 06/15/2005 Document Revised: 10/30/2013 Document Reviewed: 01/18/2008 [facility] Patient Information 2015 [place], [place]. This information is not intended to replace advice given to you by your health care provider. Make sure you discuss any questions you have with your health care provider.

## 2014-06-11 NOTE — PMR Pre-admission (Signed)
PMR Admission Coordinator Pre-Admission Assessment  Patient: Stefanie Henry is an 80 y.o., female MRN: [id number] DOB: [birth year] Height: 4\' 11"  (149.9 cm) Weight: 58.196 kg (128 lb 4.8 oz)              Insurance Information HMO: No    PPO:       PCP:       IPA:       80/20:       OTHER:   PRIMARY: Medicare A/B      Policy#: [id number] m      Subscriber: [name] CM Name:        Phone#:       Fax#:   Pre-Cert#:        Employer: Unemployed Benefits:  Phone #:       Name: Checked in [place]. Date: 02/27/06=A and 07/30/05=B     Deduct: $1260      Out of Pocket Max: none      Life Max: unlimited CIR: 100%      SNF: 100 days Outpatient: 80%     Co-Pay: 20% Home Health: 100%      Co-Pay: 20% DME: 80%     Co-Pay: 20% Providers: patient's choice  SECONDARY: Medicaid of [place]     Policy#: [id number] m      Subscriber: [name] CM Name:        Phone#:       Fax#:   Pre-Cert#:        Employer: Unemployed Benefits:  Phone #: [phone]     Name: Automated Eff. Date: Eligible 06/11/14     Deduct:        Out of Pocket Max:        Life Max:   CIR:        SNF:   Outpatient:       Co-Pay:   Home Health:        Co-Pay:   DME:       Co-Pay:    Emergency Contact Information Contact Information    Name Relation Home Work [place] Son [phone]  [phone]   [place] Daughter   [phone]     Current Medical History  Patient Admitting Diagnosis:  L1 comp fx after fall with h/o CLL  History of Present Illness: An 80 y.o. right handed female with history of CLL followed by oncology services Dr. [name], anemia, chronic abdominal pain. Presented 06/08/2014 after a fall 05/29/2014 while visiting her daughter in [place] with increased low back pain. Patient was independent prior to admission living with her son and daughter-in-law. Lumbar x-rays and MRI from outside hospital showed a L1 compression fracture. Hospital course conservative care with pain management.  Subcutaneous heparin for DVT prophylaxis. Occupational therapy evaluation completed with recommendations of physical medicine rehabilitation consult.     Past Medical History  Past Medical History  Diagnosis Date  . Asthma   . Arthritis   . Shingles   . Anemia, iron deficiency 06/06/2012  . Chronic lymphatic leukemia   . Gallstones     Family History  family history includes Diabetes in her father.  Prior Rehab/Hospitalizations:  None   Current Medications  Current facility-administered medications: acetaminophen (TYLENOL) tablet 650 mg, 650 mg, Oral, Q6H WA, Olam Idler, MD, 650 mg at 06/11/14 1409;  acidophilus (RISAQUAD) capsule 1 capsule, 1 capsule, Oral, Daily, Olam Idler, MD, 1 capsule at 06/11/14 0950;  albuterol (PROVENTIL) (2.5 MG/3ML) 0.083% nebulizer solution 2.5 mg, 2.5 mg, Inhalation,  Q6H PRN, Olam Idler, MD alum & mag hydroxide-simeth (MAALOX/MYLANTA) 200-200-20 MG/5ML suspension 15 mL, 15 mL, Oral, PRN, Olam Idler, MD, 15 mL at 06/09/14 1821;  calcitonin (salmon) (MIACALCIN/FORTICAL) nasal spray 1 spray, 1 spray, Alternating Nares, Daily, Olam Idler, MD, 1 spray at 06/10/14 1632;  docusate sodium (COLACE) capsule 100 mg, 100 mg, Oral, Daily, Alveda Reasons, MD, 100 mg at 06/11/14 0950 famotidine (PEPCID) tablet 20 mg, 20 mg, Oral, Daily, Olam Idler, MD, 20 mg at 06/11/14 0950;  gabapentin (NEURONTIN) capsule 100 mg, 100 mg, Oral, Daily, Olam Idler, MD, 100 mg at 06/11/14 0950;  heparin injection 5,000 Units, 5,000 Units, Subcutaneous, 3 times per day, Olam Idler, MD, 5,000 Units at 06/11/14 1411 ondansetron (ZOFRAN) tablet 4 mg, 4 mg, Oral, Q6H PRN **OR** ondansetron (ZOFRAN) injection 4 mg, 4 mg, Intravenous, Q6H PRN, Olam Idler, MD;  oxyCODONE (Oxy IR/ROXICODONE) immediate release tablet 5 mg, 5 mg, Oral, Q6H, Leone Haven, MD, 5 mg at 06/11/14 1215;  oxyCODONE (Oxy IR/ROXICODONE) immediate release tablet 5 mg, 5 mg, Oral, Q4H PRN, Leone Haven, MD pantoprazole (PROTONIX) EC tablet 40 mg, 40 mg, Oral, Daily, Olam Idler, MD, 40 mg at 06/11/14 0950;  predniSONE (DELTASONE) tablet 10 mg, 10 mg, Oral, Q breakfast, Olam Idler, MD, 10 mg at 06/11/14 0756;  senna (SENOKOT) tablet 17.2 mg, 2 tablet, Oral, q morning - 10a, Olam Idler, MD, 17.2 mg at 06/11/14 4696;  simethicone (MYLICON) chewable tablet 80 mg, 80 mg, Oral, PRN, Olam Idler, MD, 80 mg at 06/11/14 2952 simethicone (MYLICON) chewable tablet 80 mg, 80 mg, Oral, Daily, Olam Idler, MD, 80 mg at 06/10/14 8413  Patients Current Diet: Diet vegetarian  Precautions / Restrictions Precautions Precautions: Back Precaution Comments: instructed in back precautions to manage pain Restrictions Weight Bearing Restrictions: No   Prior Activity Level Household: Went out 1 X a month to MD appts, or to the park with her son.   Home Assistive Devices / Equipment Home Assistive Devices/Equipment: Brace (specify type), Cane (specify quad or straight), Eyeglasses (knee brace) Home Equipment: Cane - quad  Prior Functional Level Prior Function Level of Independence: Independent with assistive device(s) Comments: would walk 1 block daily with cane prior to admission  Current Functional Level Cognition  Overall Cognitive Status: Within Functional Limits for tasks assessed Orientation Level: Oriented X4    Extremity Assessment (includes Sensation/Coordination)  Upper Extremity Assessment: Overall WFL for tasks assessed Lower Extremity Assessment: Generalized weakness Cervical / Trunk Assessment: Normal     ADLs  Overall ADL's : Needs assistance/impaired Eating/Feeding: Independent, Sitting Grooming: Set up, Sitting Upper Body Bathing: Set up, Sitting Lower Body Bathing: Sit to/from stand, Moderate assistance Upper Body Dressing : Minimal assistance, Sitting Lower Body Dressing: Sit to/from stand, Maximal assistance Toilet Transfer: Min guard, Ambulation,  RW Toilet Transfer Details (indicate cue type and reason): sit<>stand from chair with youth RW. VC's for hand placement Functional mobility during ADLs: Min guard, Rolling walker General ADL Comments: Pt lives with her son and her daughter is visiting from [place] until Tuesday. Pt will not have full Supervision at son's home due to his work schedule and DIL performing homecare and child rearing. Pt has weakness of Bil LEs which impairs her independence. Pt's back pain is limiting her independence with LB ADLs and functional mobility.     Mobility  Overal bed mobility: Needs Assistance Bed Mobility: Rolling, Sidelying to Sit Rolling: Min assist  Sidelying to sit: Mod assist Supine to sit: Mod assist General bed mobility comments: Pt ambulating in room with daughter when OT arrived (chair alarm going off).     Transfers  Overall transfer level: Needs assistance Equipment used: Rolling walker (2 wheeled), Quad cane Transfers: Sit to/from Stand Sit to Stand: Min guard General transfer comment: Min guard for sit<>stand with RW.     Ambulation / Gait / Stairs / Wheelchair Mobility  Ambulation/Gait Ambulation/Gait assistance: Museum/gallery curator (Feet): 30 Feet Assistive device: Quad cane, 1 person hand held assist Gait Pattern/deviations: Step-through pattern, Decreased stride length, Shuffle, Narrow base of support Gait velocity interpretation: Below normal speed for age/gender General Gait Details: Very slow and guarded. Only able to tolerate 30 feet today due to pain. VC for forward gaze and upright posture while using quad cane for support. Min assist provided for balance with turns, otherwise min guard. Complains of pain in abdomen while ambulating. Stairs:  (Pt unwilling to attempt due to pain)    Posture / Balance Overall balance assessment: Needs assistance Sitting-balance support: Bilateral upper extremity supported (to manage pain) Sitting balance-Leahy Scale: Fair Standing  balance support: Bilateral upper extremity supported Standing balance-Leahy Scale: Poor    Special needs/care consideration BiPAP/CPAP No CPM No Continuous Drip IV D5% and 0.45% NS with KCL 30 meq 75 ml/hr Dialysis No       Life Vest No Oxygen No Special Bed No Trach Size No Wound Vac (area) No     Skin Has pain left side of face from shingles 7 yrs ago                            Bowel mgmt: Had BM 06/10/14, constipation issues Bladder mgmt: Voiding in bathroom with assistance Diabetic mgmt No    Previous Home Environment Living Arrangements: Children (pt lives with son in [place]) Available Help at Discharge: Family, Available 24 hours/day (pt's son works 3rd shift. DIL does homemaking and cares for ) Type of Home: Apartment Home Layout: One level Home Access: Stairs to enter Entrance Stairs-Rails: Right Entrance Stairs-Number of Steps: 14 Bathroom Toilet: [place]: No Additional Comments: used 4 prong cane x 4-5 months to manage pain Rt knee  Discharge Living Setting Plans for Discharge Living Setting: Lives with (comment), Apartment (Lives with son and dtr-in-law in apt.) Type of Home at Discharge: Apartment Discharge Home Layout: One level Discharge Home Access: Stairs to enter Entrance Stairs-Number of Steps: 12-13 steps with a landing in between steps up to apartment. Does the patient have any problems obtaining your medications?: No  Social/Family/Support Systems Patient Roles: Parent (Lives with son.  Is a widow.  Dtr from [place], [place].)  Dtr will be in [place] until 06/19/14 then returns to [place]. Contact Information: Maurene Capes - son Anticipated Caregiver: Maurene Capes - son Anticipated Ambulance person Information: Maurene Capes (h) [phone] (c) [phone] Ability/Limitations of Caregiver: Anil works nights.  Dtr in law will be with patient during the day. Caregiver Availability: 24/7 Discharge Plan Discussed with Primary Caregiver: Yes Is Caregiver In  Agreement with Plan?: Yes Does Caregiver/Family have Issues with Lodging/Transportation while Pt is in Rehab?: No  Goals/Additional Needs Patient/Family Goal for Rehab: PT/OT mod goals Expected length of stay: 6-7 days Cultural Considerations: Is from [place].  Speaks Hindi.  Hindu religeon.  Understands English, but does not speak well. Dietary Needs: Vegetarian Equipment Needs: TBD Pt/Family Agrees to Admission and willing to participate: Yes Program  Orientation Provided & Reviewed with Pt/Caregiver Including Roles  & Responsibilities: Yes  Decrease burden of Care through IP rehab admission: N/A  Possible need for SNF placement upon discharge: Not planned  Patient Condition: This patient's condition remains as documented in the consult dated 06/11/14, in which the Rehabilitation Physician determined and documented that the patient's condition is appropriate for intensive rehabilitative care in an inpatient rehabilitation facility. Will admit to inpatient rehab today.  Preadmission Screen Completed By:  Retta Diones, 06/11/2014 3:57 PM ______________________________________________________________________   Discussed status with Dr. Naaman Plummer on 06/11/14 at 1608 and received telephone approval for admission today.  Admission Coordinator:  Retta Diones, time1608/Date12/14/15

## 2014-06-11 NOTE — Progress Notes (Signed)
PMR Admission Coordinator Pre-Admission Assessment  Patient: Stefanie Henry is an 80 y.o., female MRN: [id number] DOB: 03-Jul-1929 Height: 4\' 11"  (149.9 cm) Weight: 58.196 kg (128 lb 4.8 oz)  Insurance Information HMO: No PPO: PCP: IPA: 80/20: OTHER:  PRIMARY: Medicare A/B Policy#: [id number] m Subscriber: [name] CM Name: Phone#: Fax#:  Pre-Cert#: Employer: Unemployed Benefits: Phone #: Name: Checked in [place]. Date: 02/27/06=A and 07/30/05=B Deduct: $1260 Out of Pocket Max: none  Life Max: unlimited CIR: 100% SNF: 100 days Outpatient: 80% Co-Pay: 20% Home Health: 100% Co-Pay: 20% DME: 80% Co-Pay: 20% Providers: patient's choice  SECONDARY: Medicaid of [place] Policy#: [id number] m Subscriber: [name] CM Name: Phone#: Fax#:  Pre-Cert#: Employer: Unemployed Benefits: Phone #: [phone] Name: Automated Eff. Date: Eligible 06/11/14 Deduct: Out of Pocket Max: Life Max:  CIR: SNF:  Outpatient: Co-Pay:  Home Health: Co-Pay:  DME: Co-Pay:   Emergency Contact Information Contact Information    Name Relation Home Work [place] Son [phone]  [phone]   [place] Daughter   [phone]     Current Medical History  Patient Admitting Diagnosis: L1 comp fx after fall with h/o CLL  History of Present Illness: An 80 y.o. right handed female with history of CLL followed by oncology services Dr. [name], anemia, chronic abdominal pain. Presented 06/08/2014 after a fall 05/29/2014 while visiting her daughter in [place] with increased low back pain. Patient was independent prior to admission living with her son and  daughter-in-law. Lumbar x-rays and MRI from outside hospital showed a L1 compression fracture. Hospital course conservative care with pain management. Subcutaneous heparin for DVT prophylaxis. Occupational therapy evaluation completed with recommendations of physical medicine rehabilitation consult.    Past Medical History  Past Medical History  Diagnosis Date  . Asthma   . Arthritis   . Shingles   . Anemia, iron deficiency 06/06/2012  . Chronic lymphatic leukemia   . Gallstones     Family History  family history includes Diabetes in her father.  Prior Rehab/Hospitalizations: None  Current Medications  Current facility-administered medications: acetaminophen (TYLENOL) tablet 650 mg, 650 mg, Oral, Q6H WA, Olam Idler, MD, 650 mg at 06/11/14 1409; acidophilus (RISAQUAD) capsule 1 capsule, 1 capsule, Oral, Daily, Olam Idler, MD, 1 capsule at 06/11/14 0950; albuterol (PROVENTIL) (2.5 MG/3ML) 0.083% nebulizer solution 2.5 mg, 2.5 mg, Inhalation, Q6H PRN, Olam Idler, MD alum & mag hydroxide-simeth (MAALOX/MYLANTA) 200-200-20 MG/5ML suspension 15 mL, 15 mL, Oral, PRN, Olam Idler, MD, 15 mL at 06/09/14 1821; calcitonin (salmon) (MIACALCIN/FORTICAL) nasal spray 1 spray, 1 spray, Alternating Nares, Daily, Olam Idler, MD, 1 spray at 06/10/14 1632; docusate sodium (COLACE) capsule 100 mg, 100 mg, Oral, Daily, Alveda Reasons, MD, 100 mg at 06/11/14 0950 famotidine (PEPCID) tablet 20 mg, 20 mg, Oral, Daily, Olam Idler, MD, 20 mg at 06/11/14 0950; gabapentin (NEURONTIN) capsule 100 mg, 100 mg, Oral, Daily, Olam Idler, MD, 100 mg at 06/11/14 0950; heparin injection 5,000 Units, 5,000 Units, Subcutaneous, 3 times per day, Olam Idler, MD, 5,000 Units at 06/11/14 1411 ondansetron (ZOFRAN) tablet 4 mg, 4 mg, Oral, Q6H PRN **OR** ondansetron (ZOFRAN) injection 4 mg, 4 mg, Intravenous, Q6H PRN, Olam Idler, MD; oxyCODONE (Oxy IR/ROXICODONE)  immediate release tablet 5 mg, 5 mg, Oral, Q6H, Leone Haven, MD, 5 mg at 06/11/14 1215; oxyCODONE (Oxy IR/ROXICODONE) immediate release tablet 5 mg, 5 mg, Oral, Q4H PRN, Leone Haven, MD pantoprazole (PROTONIX) EC tablet 40 mg, 40 mg, Oral, Daily,  Olam Idler, MD, 40 mg at 06/11/14 0950; predniSONE (DELTASONE) tablet 10 mg, 10 mg, Oral, Q breakfast, Olam Idler, MD, 10 mg at 06/11/14 0756; senna (SENOKOT) tablet 17.2 mg, 2 tablet, Oral, q morning - 10a, Olam Idler, MD, 17.2 mg at 06/11/14 7062; simethicone (MYLICON) chewable tablet 80 mg, 80 mg, Oral, PRN, Olam Idler, MD, 80 mg at 06/11/14 3762 simethicone (MYLICON) chewable tablet 80 mg, 80 mg, Oral, Daily, Olam Idler, MD, 80 mg at 06/10/14 8315  Patients Current Diet: Diet vegetarian  Precautions / Restrictions Precautions Precautions: Back Precaution Comments: instructed in back precautions to manage pain Restrictions Weight Bearing Restrictions: No   Prior Activity Level Household: Went out 1 X a month to MD appts, or to the park with her son.   Home Assistive Devices / Equipment Home Assistive Devices/Equipment: Brace (specify type), Cane (specify quad or straight), Eyeglasses (knee brace) Home Equipment: Cane - quad  Prior Functional Level Prior Function Level of Independence: Independent with assistive device(s) Comments: would walk 1 block daily with cane prior to admission  Current Functional Level Cognition  Overall Cognitive Status: Within Functional Limits for tasks assessed Orientation Level: Oriented X4   Extremity Assessment (includes Sensation/Coordination)  Upper Extremity Assessment: Overall WFL for tasks assessed Lower Extremity Assessment: Generalized weakness Cervical / Trunk Assessment: Normal    ADLs  Overall ADL's : Needs assistance/impaired Eating/Feeding: Independent, Sitting Grooming: Set up, Sitting Upper Body Bathing: Set up, Sitting Lower Body Bathing:  Sit to/from stand, Moderate assistance Upper Body Dressing : Minimal assistance, Sitting Lower Body Dressing: Sit to/from stand, Maximal assistance Toilet Transfer: Min guard, Ambulation, RW Toilet Transfer Details (indicate cue type and reason): sit<>stand from chair with youth RW. VC's for hand placement Functional mobility during ADLs: Min guard, Rolling walker General ADL Comments: Pt lives with her son and her daughter is visiting from [place] until Tuesday. Pt will not have full Supervision at son's home due to his work schedule and DIL performing homecare and child rearing. Pt has weakness of Bil LEs which impairs her independence. Pt's back pain is limiting her independence with LB ADLs and functional mobility.     Mobility  Overal bed mobility: Needs Assistance Bed Mobility: Rolling, Sidelying to Sit Rolling: Min assist Sidelying to sit: Mod assist Supine to sit: Mod assist General bed mobility comments: Pt ambulating in room with daughter when OT arrived (chair alarm going off).     Transfers  Overall transfer level: Needs assistance Equipment used: Rolling walker (2 wheeled), Quad cane Transfers: Sit to/from Stand Sit to Stand: Min guard General transfer comment: Min guard for sit<>stand with RW.     Ambulation / Gait / Stairs / Wheelchair Mobility  Ambulation/Gait Ambulation/Gait assistance: Museum/gallery curator (Feet): 30 Feet Assistive device: Quad cane, 1 person hand held assist Gait Pattern/deviations: Step-through pattern, Decreased stride length, Shuffle, Narrow base of support Gait velocity interpretation: Below normal speed for age/gender General Gait Details: Very slow and guarded. Only able to tolerate 30 feet today due to pain. VC for forward gaze and upright posture while using quad cane for support. Min assist provided for balance with turns, otherwise min guard. Complains of pain in abdomen while ambulating. Stairs: (Pt unwilling to attempt due  to pain)    Posture / Balance Overall balance assessment: Needs assistance Sitting-balance support: Bilateral upper extremity supported (to manage pain) Sitting balance-Leahy Scale: Fair Standing balance support: Bilateral upper extremity supported Standing balance-Leahy Scale: Poor   Special  needs/care consideration BiPAP/CPAP No CPM No Continuous Drip IV D5% and 0.45% NS with KCL 30 meq 75 ml/hr Dialysis No  Life Vest No Oxygen No Special Bed No Trach Size No Wound Vac (area) No  Skin Has pain left side of face from shingles 7 yrs ago  Bowel mgmt: Had BM 06/10/14, constipation issues Bladder mgmt: Voiding in bathroom with assistance Diabetic mgmt No    Previous Home Environment Living Arrangements: Children (pt lives with son in [place]) Available Help at Discharge: Family, Available 24 hours/day (pt's son works 3rd shift. DIL does homemaking and cares for ) Type of Home: Apartment Home Layout: One level Home Access: Stairs to enter Entrance Stairs-Rails: Right Entrance Stairs-Number of Steps: 14 Bathroom Toilet: [place]: No Additional Comments: used 4 prong cane x 4-5 months to manage pain Rt knee  Discharge Living Setting Plans for Discharge Living Setting: Lives with (comment), Apartment (Lives with son and dtr-in-law in apt.) Type of Home at Discharge: Apartment Discharge Home Layout: One level Discharge Home Access: Stairs to enter Entrance Stairs-Number of Steps: 12-13 steps with a landing in between steps up to apartment. Does the patient have any problems obtaining your medications?: No  Social/Family/Support Systems Patient Roles: Parent (Lives with son. Is a widow. Dtr from [place], [place].) Dtr will be in [place] until 06/19/14 then returns to [place]. Contact Information: Maurene Capes - son Anticipated Caregiver: Maurene Capes - son Anticipated Ambulance person Information: Maurene Capes (h) [phone] (c)  [phone] Ability/Limitations of Caregiver: Anil works nights. Dtr in law will be with patient during the day. Caregiver Availability: 24/7 Discharge Plan Discussed with Primary Caregiver: Yes Is Caregiver In Agreement with Plan?: Yes Does Caregiver/Family have Issues with Lodging/Transportation while Pt is in Rehab?: No  Goals/Additional Needs Patient/Family Goal for Rehab: PT/OT mod goals Expected length of stay: 6-7 days Cultural Considerations: Is from [place]. Speaks Hindi. Hindu religeon. Understands English, but does not speak well. Dietary Needs: Vegetarian Equipment Needs: TBD Pt/Family Agrees to Admission and willing to participate: Yes Program Orientation Provided & Reviewed with Pt/Caregiver Including Roles & Responsibilities: Yes  Decrease burden of Care through IP rehab admission: N/A  Possible need for SNF placement upon discharge: Not planned  Patient Condition: This patient's condition remains as documented in the consult dated 06/11/14, in which the Rehabilitation Physician determined and documented that the patient's condition is appropriate for intensive rehabilitative care in an inpatient rehabilitation facility. Will admit to inpatient rehab today.  Preadmission Screen Completed By: Retta Diones, 06/11/2014 3:57 PM ______________________________________________________________________  Discussed status with Dr. Naaman Plummer on 06/11/14 at 1608 and received telephone approval for admission today.  Admission Coordinator: Retta Diones, time1608/Date12/14/15          Cosigned by: Meredith Staggers, MD at 06/11/2014 4:15 PM  Revision History

## 2014-06-11 NOTE — Clinical Social Work Psychosocial (Signed)
Clinical Social Work Department BRIEF PSYCHOSOCIAL ASSESSMENT 06/11/2014  Patient:  Stefanie Henry, Stefanie Henry     Account Number:  [id number]     Admit date:  06/08/2014  Clinical Social Worker:  Glendon Axe, CLINICAL SOCIAL WORKER  Date/Time:  06/11/2014 10:00 AM  Referred by:  Physician  Date Referred:  06/11/2014 Referred for  SNF Placement   Other Referral:   Interview type:  Other - See comment Other interview type:   CSW spoke with pt and pt's daughter present at bedside.    PSYCHOSOCIAL DATA Living Status:  FAMILY Admitted from facility:   Level of care:   Primary support name:  Ani Seher l Primary support relationship to patient:  CHILD, ADULT Degree of support available:   unknown    CURRENT CONCERNS Current Concerns  Post-Acute Placement   Other Concerns:    SOCIAL WORK ASSESSMENT / PLAN Clinical Social Worker met with pt and pt's daughter at bedside in reference to post-acute placement for SNF. CSW explained SNF process and reviewed list with pt and pt's daughter. Pt's daughter reported she would like CIR placement for pt. Pt has a CIR consult. Pt's daughter also expressed concerns of pt having her own private room, staff to resident ratio and being able to have visitors overnight. CSW explained rooming options and advised pt's daughter to research SNF's. CSW has submitted pt's clinicals and will continue to follow pt for disposition.   Assessment/plan status:  Psychosocial Support/Ongoing Assessment of Needs Other assessment/ plan:   Information/referral to community resources:   SNF information/list provided.    PATIENT'S/FAMILY'S RESPONSE TO PLAN OF CARE: Pt sitting at bedside, alert and oriented. Pt does not speak english. Pt's daughter would like pt admitted into CIR but agreeable to SNF as a backup. Pt's daughter appreicated social work intervention.     Glendon Axe, MSW, LCSWA [phone] 06/11/2014 1:02 PM

## 2014-06-11 NOTE — Interval H&P Note (Signed)
Stefanie Henry was admitted today to Inpatient Rehabilitation with the diagnosis of L1 compression fracture after fall.  The patient's history has been reviewed, patient examined, and there is no change in status.  Patient continues to be appropriate for intensive inpatient rehabilitation.  I have reviewed the patient's chart and labs.  Questions were answered to the patient's satisfaction.  [name] T 06/11/2014, 9:16 PM

## 2014-06-11 NOTE — H&P (Signed)
Physical Medicine and Rehabilitation Admission H&P    Chief Complaint  Patient presents with  . Back Pain  : HPI: Stefanie Henry is a 80 y.o. right handed female with history of CLL followed by oncology services Dr. [name], anemia, chronic abdominal pain. Presented 06/08/2014 after a fall 05/29/2014 while visiting her daughter in [place] with increased low back pain. Patient was independent prior to admission living with her son and daughter-in-law. Lumbar x-rays and MRI from outside hospital showed a L1 compression fracture. Hospital course conservative care with pain management. Subcutaneous heparin for DVT prophylaxis. Occupational therapy evaluation completed with recommendations of physical medicine rehabilitation consult. Patient was admitted for comprehensive rehabilitation program  ROS Review of Systems  Gastrointestinal: Positive for constipation.   GERD  Musculoskeletal: Positive for myalgias and back pain.  All other systems reviewed and are negative   Past Medical History  Diagnosis Date  . Asthma   . Arthritis   . Shingles   . Anemia, iron deficiency 06/06/2012  . Chronic lymphatic leukemia   . Gallstones    History reviewed. No pertinent past surgical history. Family History  Problem Relation Age of Onset  . Diabetes Father    Social History:  reports that she has never smoked. She has never used smokeless tobacco. She reports that she does not drink alcohol or use illicit drugs. Allergies:  Allergies  Allergen Reactions  . Contrast Media [Iodinated Diagnostic Agents] Other (See Comments)    Stomach discomfort  . Fruit & Vegetable Daily [Nutritional Supplements] Other (See Comments)    Citrus fruit causes stomach discomfort  . Ibuprofen Other (See Comments)    Stomach discomfort   Medications Prior to Admission  Medication Sig Dispense Refill  . ADVAIR DISKUS 250-50 MCG/DOSE AEPB Inhale 1 puff into the lungs as needed (for shortness of breath).       Marland Kitchen albuterol (PROVENTIL HFA;VENTOLIN HFA) 108 (90 BASE) MCG/ACT inhaler Inhale 1-2 puffs into the lungs every 6 (six) hours as needed for wheezing or shortness of breath.    Marland Kitchen albuterol (PROVENTIL) (5 MG/ML) 0.5% nebulizer solution Take 2.5 mg by nebulization every 6 (six) hours as needed for shortness of breath.     . Alpha-D-Galactosidase (BEANO) TABS Take 1 tablet by mouth as needed (for bloating).     . Calcium & Magnesium Carbonates (MYLANTA PO) Take 1 tablet by mouth as needed (for indigestion).     . famotidine (PEPCID) 20 MG tablet Take 20 mg by mouth daily.    Marland Kitchen gabapentin (NEURONTIN) 100 MG capsule Take 100 mg by mouth daily.     Marland Kitchen omeprazole (PRILOSEC) 20 MG capsule Take 1 capsule (20 mg total) by mouth 2 (two) times daily before a meal. (Patient taking differently: Take 20 mg by mouth daily. ) 60 capsule 11  . Probiotic Product (PROBIOTIC DAILY PO) Take 1 capsule by mouth daily.    . Sennosides (SENNA) 15 MG TABS Take by mouth every morning.    . Simethicone (GAS-X PO) Take 2 capsules by mouth daily.     Marland Kitchen KLOR-CON M20 20 MEQ tablet TAKE 2 TABLETS TWICE A DAY FOR 3 DAYS THEN 2 TABLETS ONCE DAILY 60 tablet 4  . predniSONE (DELTASONE) 10 MG tablet TAKE 1 TABLET BY MOUTH EVERY DAY 30 tablet 0  . predniSONE (DELTASONE) 5 MG tablet Take 5 mg by mouth daily with breakfast.      Home: Home Living Family/patient expects to be discharged to:: Private residence Living Arrangements: Children (pt  lives with son in [place]) Available Help at Discharge: Family, Available 24 hours/day (pt's son works 3rd shift. DIL does homemaking and cares for ) Type of Home: Apartment Home Access: Stairs to enter [employer] of Steps: 14 Entrance Stairs-Rails: Right Home Layout: One level Home Equipment: Cane - quad Additional Comments: used cane x 4-5 months to manage pain Rt knee   Functional History: Prior Function Level of Independence: Independent with assistive device(s) Comments:  would walk 1 block daily with cane prior to admission  Functional Status:  Mobility: Bed Mobility Overal bed mobility: Needs Assistance Bed Mobility: Rolling, Sidelying to Sit Rolling: Min assist Sidelying to sit: Mod assist Supine to sit: Mod assist General bed mobility comments: Pt ambulating in room with daughter when OT arrived (chair alarm going off).  Transfers Overall transfer level: Needs assistance Equipment used: Rolling walker (2 wheeled), Quad cane Transfers: Sit to/from Stand Sit to Stand: Min guard General transfer comment: Min guard for sit<>stand with RW.  Ambulation/Gait Ambulation/Gait assistance: Min assist Ambulation Distance (Feet): 30 Feet Assistive device: Quad cane, 1 person hand held assist Gait Pattern/deviations: Step-through pattern, Decreased stride length, Shuffle, Narrow base of support Gait velocity interpretation: Below normal speed for age/gender General Gait Details: Very slow and guarded. Only able to tolerate 30 feet today due to pain. VC for forward gaze and upright posture while using quad cane for support. Min assist provided for balance with turns, otherwise min guard. Complains of pain in abdomen while ambulating. Stairs:  (Pt unwilling to attempt due to pain)    ADL: ADL Overall ADL's : Needs assistance/impaired Eating/Feeding: Independent, Sitting Grooming: Set up, Sitting Upper Body Bathing: Set up, Sitting Lower Body Bathing: Sit to/from stand, Moderate assistance Upper Body Dressing : Minimal assistance, Sitting Lower Body Dressing: Sit to/from stand, Maximal assistance Toilet Transfer: Min guard, Ambulation, RW Toilet Transfer Details (indicate cue type and reason): sit<>stand from chair with youth RW. VC's for hand placement Functional mobility during ADLs: Min guard, Rolling walker General ADL Comments: Pt lives with her son and her daughter is visiting from [place] until Tuesday. Pt will not have full Supervision at son's home due  to his work schedule and DIL performing homecare and child rearing. Pt has weakness of Bil LEs which impairs her independence. Pt's back pain is limiting her independence with LB ADLs and functional mobility.   Cognition: Cognition Overall Cognitive Status: Within Functional Limits for tasks assessed Orientation Level: Oriented X4 Cognition Arousal/Alertness: Awake/alert Behavior During Therapy: WFL for tasks assessed/performed Overall Cognitive Status: Within Functional Limits for tasks assessed  Physical Exam: Blood pressure 124/62, pulse 83, temperature 98.3 F (36.8 C), temperature source Oral, resp. rate 16, height $RemoveBe'4\' 11"'WClgRxyas$  (1.499 m), weight 58.196 kg (128 lb 4.8 oz), SpO2 98 %. Physical Exam Constitutional: She appears well-developed and well-nourished.  Petite  HENT:  Head: Normocephalic and atraumatic.  Right Ear: External ear normal.  Left Ear: External ear normal.  Mouth/Throat: Oropharynx is clear and moist.  Poor dentition  Eyes: Conjunctivae and EOM are normal. Pupils are equal, round, and reactive to light.  Neck: Normal range of motion. Neck supple. No JVD present. No tracheal deviation present. No thyromegaly present.  Cardiovascular: Normal rate and regular rhythm. Exam reveals no gallop and no friction rub.  No murmur heard. Respiratory: Effort normal and breath sounds normal. No respiratory distress. She has no wheezes. She has no rales.  GI: Soft. Bowel sounds are normal. She exhibits no distension. There is no tenderness. There  is no rebound.  Musculoskeletal:  Low back tender to palpation. Has difficulty with bed mobility and use of hip girdle muscles do to pain  Lymphadenopathy:   She has no cervical adenopathy.  Neurological: She is alert. She displays normal reflexes. No cranial nerve deficit. She exhibits normal muscle tone.  Limited English speaking. Follows commands. UES: 4/5 proximal to distal. LE: HF 2, KE 4-, ADF/APF 4/5. No sensory deficits.    Skin: Skin is warm and dry.  Some tenderness to palpation lumbar spine  Psychiatric: She has a normal mood and affect. Her behavior is normal. Judgment and thought content normal Results for orders placed or performed during the hospital encounter of 06/08/14 (from the past 48 hour(s))  Urinalysis, Routine w reflex microscopic     Status: None   Collection Time: 06/09/14 10:27 PM  Result Value Ref Range   Color, Urine YELLOW YELLOW   APPearance CLEAR CLEAR   Specific Gravity, Urine 1.011 1.005 - 1.030   pH 6.5 5.0 - 8.0   Glucose, UA NEGATIVE NEGATIVE mg/dL   Hgb urine dipstick NEGATIVE NEGATIVE   Bilirubin Urine NEGATIVE NEGATIVE   Ketones, ur NEGATIVE NEGATIVE mg/dL   Protein, ur NEGATIVE NEGATIVE mg/dL   Urobilinogen, UA 0.2 0.0 - 1.0 mg/dL   Nitrite NEGATIVE NEGATIVE   Leukocytes, UA NEGATIVE NEGATIVE    Comment: MICROSCOPIC NOT DONE ON URINES WITH NEGATIVE PROTEIN, BLOOD, LEUKOCYTES, NITRITE, OR GLUCOSE <1000 mg/dL.  CBC     Status: Abnormal   Collection Time: 06/11/14  8:30 AM  Result Value Ref Range   WBC 49.9 (H) 4.0 - 10.5 K/uL   RBC 4.83 3.87 - 5.11 MIL/uL   Hemoglobin 10.9 (L) 12.0 - 15.0 g/dL   HCT 35.2 (L) 36.0 - 46.0 %   MCV 72.9 (L) 78.0 - 100.0 fL   MCH 22.6 (L) 26.0 - 34.0 pg   MCHC 31.0 30.0 - 36.0 g/dL   RDW 14.5 11.5 - 15.5 %   Platelets 107 (L) 150 - 400 K/uL    Comment: CONSISTENT WITH PREVIOUS RESULT  Basic metabolic panel     Status: Abnormal   Collection Time: 06/11/14  8:30 AM  Result Value Ref Range   Sodium 138 137 - 147 mEq/L   Potassium 4.2 3.7 - 5.3 mEq/L   Chloride 100 96 - 112 mEq/L   CO2 28 19 - 32 mEq/L   Glucose, Bld 111 (H) 70 - 99 mg/dL   BUN 9 6 - 23 mg/dL   Creatinine, Ser 0.75 0.50 - 1.10 mg/dL   Calcium 8.6 8.4 - 10.5 mg/dL   GFR calc non Af Amer 77 (L) >90 mL/min   GFR calc Af Amer 89 (L) >90 mL/min    Comment: (NOTE) The eGFR has been calculated using the CKD EPI equation. This calculation has not been validated in all  clinical situations. eGFR's persistently <90 mL/min signify possible Chronic Kidney Disease.    Anion gap 10 5 - 15   No results found.     Medical Problem List and Plan: 1. Functional deficits secondary to L1 compression fracture after fall 2.  DVT Prophylaxis/Anticoagulation: Subcutaneous heparin. Monitor platelet counts and any signs of bleeding 3. Pain Management: Neurontin 100 mg daily, Oxycodone as needed. 4. CLL. Follow-up oncology services 5. Neuropsych: This patient is capable of making decisions on her own behalf. 6. Skin/Wound Care: Routine skin checks 7. Fluids/Electrolytes/Nutrition: Strict I and O with follow-up chemistries 8. Iron deficiency anemia. Follow-up labs 9. GERD. Protonix  Post Admission Physician Evaluation: 1. Functional deficits secondary  to L1 compression fracture after fall. 2. Patient is admitted to receive collaborative, interdisciplinary care between the physiatrist, rehab nursing staff, and therapy team. 3. Patient's level of medical complexity and substantial therapy needs in context of that medical necessity cannot be provided at a lesser intensity of care such as a SNF. 4. Patient has experienced substantial functional loss from his/her baseline which was documented above under the "Functional History" and "Functional Status" headings.  Judging by the patient's diagnosis, physical exam, and functional history, the patient has potential for functional progress which will result in measurable gains while on inpatient rehab.  These gains will be of substantial and practical use upon discharge  in facilitating mobility and self-care at the household level. 5. Physiatrist will provide 24 hour management of medical needs as well as oversight of the therapy plan/treatment and provide guidance as appropriate regarding the interaction of the two. 6. 24 hour rehab nursing will assist with bladder management, bowel management, safety, skin/wound care,  disease management, medication administration, pain management and patient education  and help integrate therapy concepts, techniques,education, etc. 7. PT will assess and treat for/with: Lower extremity strength, range of motion, stamina, balance, functional mobility, safety, adaptive techniques and equipment, pain mgt, back precautions, brace don/doff, stair climbing.   Goals are: mod I. 8. OT will assess and treat for/with: ADL's, functional mobility, safety, upper extremity strength, adaptive techniques and equipment, pain mgt, back precautions, activity tolerance.   Goals are: mod I to supervision. Therapy may proceed with showering this patient. 9. SLP will assess and treat for/with: n/a.  Goals are: n/a. 10. Case Management and Social Worker will assess and treat for psychological issues and discharge planning. 11. Team conference will be held weekly to assess progress toward goals and to determine barriers to discharge. 12. Patient will receive at least 3 hours of therapy per day at least 5 days per week. 13. ELOS: 6-7 days       14. Prognosis:  excellent     Meredith Staggers, MD, [facility] 06/11/2014   06/11/2014

## 2014-06-11 NOTE — Discharge Summary (Signed)
[facility] Discharge Summary  Patient name: Stefanie Henry Medical record number: [id number] Date of birth: [birth year] Age: 80 y.o. Gender: female Date of Admission: 06/08/2014  Date of Discharge:06/11/2014  Admitting Physician: Alveda Reasons, MD  Primary Care Provider: [name], MD Consultants: none  Indication for Hospitalization: Pain control for L1 compression fracture  Discharge Diagnoses/Problem List:  Lumbar compression fracture Back pain Fall CLL Gallstones w/out obstruction of gallbladder  Disposition: CIR  Discharge Condition: Stable  Discharge Exam: Please see progress note for day of discharge.   Brief Hospital Course:  80 yo F with known CLL, anemia, chronic abdominal pain, and gallstones who presented after fall and diagnosis of Lumbar compression fracture. Patient presented to ED in [place] and was prescribed PO Tramadol. Pain was not controlled and she came to [facility] ED for evaluation. On exam,  back TTP spinuos processes lumbar region with increased muscle tension and normal LE strength and sensation.  She was admitted to the [facility] for further management.  Repeat lumbar and hip xrays were obtained which revealed chronic degenerative changes and a compression fracture of L1.  She also had a CT abdomen/pelvis performed that was significant for splenomegaly but otherwise, no acute abnormalities.  Patient was initially treated with scheduled Tylenol and PRN Oxycodone but patient was very stoic and did not ask for pain medication until pain was very severe.  Therefore, Oxycodone was scheduled and patient's pain improved.  PT/OT was consulted who recommended treatment in CIR vs SNF.  Additionally, patient was treated with Calcitonin NS daily.  She also was continued on Prednisone 10mg , which she was taking for her CLL.  Patient was discharged in stable condition to CIR.    Issues for Follow Up:   Patient discharged on calcitonin.  Should  take for 6-12 weeks.  Follow up potassium - history of hypokalemia, normal K during this admission. Held supplementation while here.   Significant Procedures: none  Significant Labs and Imaging:   Recent Labs Lab 06/08/14 1803 06/09/14 0545 06/11/14 0830  WBC 71.9* 44.4* 49.9*  HGB 11.5* 10.0* 10.9*  HCT 36.1 32.7* 35.2*  PLT 126* 102* 107*    Recent Labs Lab 06/08/14 1803 06/09/14 0545 06/11/14 0830  NA 134* 138 138  K 5.0 4.4 4.2  CL 95* 103 100  CO2 25 22 28   GLUCOSE 126* 110* 111*  BUN 13 10 9   CREATININE 0.59 0.57 0.75  CALCIUM 9.3 8.4 8.6  ALKPHOS 138*  --   --   AST 18  --   --   ALT 34  --   --   ALBUMIN 3.7  --   --     Ct Abdomen Pelvis Wo Contrast  06/08/2014   CLINICAL DATA:  [place] on 05/29/2014, presenting now with back pain, abdominal distention and constipation. Current history of CLL.  EXAM: CT ABDOMEN AND PELVIS WITHOUT CONTRAST  TECHNIQUE: Multidetector CT imaging of the abdomen and pelvis was performed following the standard protocol without IV contrast.  COMPARISON:  None.  FINDINGS: Normal unenhanced appearance of the liver, pancreas, adrenal glands, kidneys, and gallbladder. Splenomegaly, the spleen measuring approximately 11.0 x 7.9 x 13.7 cm, yielding a volume of approximately 595 ml. No focal splenic parenchymal abnormality. No biliary ductal dilation. Moderate aorto iliac atherosclerosis without aneurysm. No significant lymphadenopathy.  Normal-appearing stomach and small bowel. Moderate stool burden throughout the colon. Descending and sigmoid colon diverticulosis without evidence of acute diverticulitis. Normal appendix in the right mid pelvis. No  ascites.  Retroverted uterus containing numerous calcified, degenerating fibroids. No adnexal masses or free pelvic fluid. Numerous pelvic phleboliths. Urinary bladder decompressed and unremarkable.  Bone window images demonstrate osseous demineralization, old obscuring a fracture involving the lower  endplate of L1, severe multifactorial spinal stenosis at the L1-2 and L4-5 levels, degenerative changes involving the sacroiliac joints, degenerative changes involving the facet joints of the lower lumbar spine, and lower thoracic spondylosis. Visualized lung bases clear apart from scarring in the right middle lobe. Heart moderately enlarged. Eventration of the right anterior hemidiaphragm.  IMPRESSION: 1. No acute abnormalities involving the abdomen or pelvis. 2. Splenomegaly. No evidence of significant lymphadenopathy in this patient with a history of CLL. 3. Descending and sigmoid colon diverticulosis without evidence of acute diverticulitis. 4. Numerous calcified, degenerating uterine fibroids. 5. Osseous findings as above.   Electronically Signed   By: Evangeline Dakin M.D.   On: 06/08/2014 21:28   Dg Lumbar Spine 2-3 Views  06/09/2014   CLINICAL DATA:  Back pain.  EXAM: LUMBAR SPINE - 2-3 VIEW  COMPARISON:  Chest x-ray 12/13/2011 and abdominal/pelvic CT from today  FINDINGS: Exam demonstrates diffuse decreased bone mineralization. Vertebral body alignment is within normal. There is mild spondylosis throughout the visualized thoracolumbar spine. There is disc space narrowing at the L5-S1 level. Facet arthropathy is present over the lower lumbar spine. There is a mild compression deformity of L1 unchanged. There is Mild gym change of the hips. There is evidence of patient's known calcified uterine fibroid measuring approximately 3.5 cm.  IMPRESSION: Mild spondylosis of the lumbar spine with disc disease at the L5-S1 level. No acute findings.  Mild chronic L1 compression fracture.   Electronically Signed   By: [name] M.D.   On: 06/09/2014 00:46   Dg Hip Bilateral W/pelvis  06/09/2014   CLINICAL DATA:  Back pain extending bilaterally into the hips. No reported injury.  EXAM: BILATERAL HIP WITH PELVIS - 4+ VIEW  COMPARISON:  CT abdomen and pelvis 06/08/2014.  FINDINGS: Diffuse bone demineralization.  Pelvis and hips appear intact. No evidence of acute fracture or dislocation. Mild degenerative changes in the hips. Calcification in the pelvis probably representing a calcified fibroid.  IMPRESSION: Degenerative changes in the hips.  No acute bony abnormalities.   Electronically Signed   By: Lucienne Capers M.D.   On: 06/09/2014 00:47    Results/Tests Pending at Time of Discharge: none  Discharge Medications:    Medication List    STOP taking these medications        KLOR-CON M20 20 MEQ tablet  Generic drug:  potassium chloride SA      TAKE these medications        ADVAIR DISKUS 250-50 MCG/DOSE Aepb  Generic drug:  Fluticasone-Salmeterol  Inhale 1 puff into the lungs as needed (for shortness of breath).     albuterol 108 (90 BASE) MCG/ACT inhaler  Commonly known as:  PROVENTIL HFA;VENTOLIN HFA  Inhale 1-2 puffs into the lungs every 6 (six) hours as needed for wheezing or shortness of breath.     albuterol (5 MG/ML) 0.5% nebulizer solution  Commonly known as:  PROVENTIL  Take 2.5 mg by nebulization every 6 (six) hours as needed for shortness of breath.     BEANO Tabs  Take 1 tablet by mouth as needed (for bloating).     calcitonin (salmon) 200 UNIT/ACT nasal spray  Commonly known as:  MIACALCIN/FORTICAL  Place 1 spray into alternate nostrils daily.     famotidine  20 MG tablet  Commonly known as:  PEPCID  Take 20 mg by mouth daily.     gabapentin 100 MG capsule  Commonly known as:  NEURONTIN  Take 100 mg by mouth daily.     GAS-X PO  Take 2 capsules by mouth daily.     MYLANTA PO  Take 1 tablet by mouth as needed (for indigestion).     omeprazole 20 MG capsule  Commonly known as:  PRILOSEC  Take 1 capsule (20 mg total) by mouth 2 (two) times daily before a meal.     oxyCODONE 5 MG immediate release tablet  Commonly known as:  Oxy IR/ROXICODONE  Take 1 tablet (5 mg total) by mouth every 6 (six) hours as needed for severe pain.     predniSONE 10 MG tablet   Commonly known as:  DELTASONE  TAKE 1 TABLET BY MOUTH EVERY DAY     PROBIOTIC DAILY PO  Take 1 capsule by mouth daily.     Senna 15 MG Tabs  Take by mouth every morning.        Discharge Instructions: Please refer to Patient Instructions section of EMR for full details.  Patient was counseled important signs and symptoms that should prompt return to medical care, changes in medications, dietary instructions, activity restrictions, and follow up appointments.   Follow-Up Appointments: Follow-up Information    Follow up with [name], MD.   Specialty:  Oncology   Contact information:   [address], [address] [place] [address] [phone]       Dimas Chyle, MD 06/11/2014, 3:52 PM PGY-1, [place]

## 2014-06-11 NOTE — Progress Notes (Signed)
Family Medicine Teaching Service Daily Progress Note Intern Pager: [phone]  Patient name: Stefanie Henry Medical record number: [id number] Date of birth: [birth year] Age: 80 y.o. Gender: female  Primary Care Provider: [name], MD Consultants: PT/OT Code Status: Full; considering DO NOT RESUSCITATE status  Pt Overview and Major Events to Date:  12/11: Admit for pain control for previous lumbar compression frx 05/29/14 12/12: PT/OT eval; Start Calcitonin  Assessment and Plan: Stefanie Henry is a 80 y.o. female presenting with lumbar compression fracture and pain control. PMH is significant for CLL, anemia, gallstones.   # Lumbar Pain: Due to compression fracture after fall on 05/29/14. On 10 mg prednisone daily. Results from lumbar x-rays and MRI for OSH should be being scanned in. Fall likely due to generalized weakness (CLL, anemia).    - Lumbar and hip xrays: Showed L1 compression frx  - PT/OT: SNF. Petite rolling walker. Patient likely needing SNF.  - c/s to SW for SNF information/placement if not accepted by CIR. - OT recommends CIR. Will f/u with CIR today. - Pain control: Tylenol scheduled; Oxy IR 5mg  scheduled - Calcitonin: nasal spray daily x6-12wks  # Osteoporosis (No Dexa) but now with fragility fracture - Consider Bisphosphonate outpatient.  # CLL with leukocytosis, anemia and thrombocytopenia. previously followed by Dr [name] oncology; Last seen 01/25/13. S/p 1 cycle of chemotherapy with Treanda; did not tolerate this well and does not wish to continue chemotherapy. Splenomegaly. Hgb 10.9, Plt 107, WBC 49.9 - Continue prednisone 10 mg daily  # Gall stones: Evaluated by [facility] 11/27/48. Opted for medical management  - continue home medication: Beano, Pepcid, Prilosec, senna, probiotics   FEN/GI: SLIV (Monitor K; on Klor-Con at home) Reg diet Prophylax: Heparin (monitor plts)  Disposition: Discharge to SNF vs CIR  Subjective:  Daughter reports that they  spoke to OT yesterday and are interested in CIR if possible.  She states that she thinks that would be the best fit for the patient and her family at this time.  In addition, patient ambulating well with rolling walker.   Patient reports that she is less stiff than yesterday and that the pain is improving.  She reports that she has been taking her pain medication a little more regularly to get ahead of the pain.  Continues to endorse bloating.  I informed her she has a PRN gas pill that she can ask for.  She voiced good understanding of this and has no additional concerns at this time.  Objective: Temp:  [97.5 F (36.4 C)-98.9 F (37.2 C)] 98.6 F (37 C) (12/14 0551) Pulse Rate:  [68-80] 70 (12/14 0551) Resp:  [16] 16 (12/14 0551) BP: (93-123)/(46-61) 123/58 mmHg (12/14 0551) SpO2:  [96 %-98 %] 98 % (12/14 0551)  Physical Exam: General: Frail elderly female; NAD, daughter at bedside HEENT:[place]/AT, EOMI, MMM Cardiovascular: RRR, No m/r/g Respiratory: CTAB; normal respiratory effort Abdomen: SNTND; BS+ Extremities: WWP; No cyanosis, clubbing or edema, +1 posterior tib pulses b/l Skin: No rash Back: Midline lumbar tenderness; mild paraspinal tenderness bilaterally with increase muscle tension, ROM limited d/t pain/stiffness (but improved from yesterday).  Patient using rolling walker and ambulating well around the room. Neuro: Lower extremity strength and sensation intact bilaterally  Laboratory:  Recent Labs Lab 06/08/14 1803 06/09/14 0545 06/11/14 0830  WBC 71.9* 44.4* 49.9*  HGB 11.5* 10.0* 10.9*  HCT 36.1 32.7* 35.2*  PLT 126* 102* 107*    Recent Labs Lab 06/08/14 1803 06/09/14 0545  NA 134* 138  K 5.0  4.4  CL 95* 103  CO2 25 22  BUN 13 10  CREATININE 0.59 0.57  CALCIUM 9.3 8.4  PROT 6.3  --   BILITOT 0.3  --   ALKPHOS 138*  --   ALT 34  --   AST 18  --   GLUCOSE 126* 110*   Imaging/Diagnostic Tests: DG lumbar IMPRESSION: Mild spondylosis of the lumbar spine  with disc disease at the L5-S1 level. No acute findings. Mild chronic L1 compression fracture.  DG Hips IMPRESSION: Degenerative changes in the hips. No acute bony abnormalities.  CT Abdomen 12/11 IMPRESSION: 1. No acute abnormalities involving the abdomen or pelvis. 2. Splenomegaly. No evidence of significant lymphadenopathy in this patient with a history of CLL. 3. Descending and sigmoid colon diverticulosis without evidence of acute diverticulitis. 4. Numerous calcified, degenerating uterine fibroids. 5. Osseous findings as above.  Janora Norlander, DO 06/11/2014, 8:52 AM PGY-1, [place] Intern pager: [phone], text pages welcome

## 2014-06-12 ENCOUNTER — Inpatient Hospital Stay (HOSPITAL_COMMUNITY): Payer: Medicare Other | Admitting: *Deleted

## 2014-06-12 ENCOUNTER — Inpatient Hospital Stay (HOSPITAL_COMMUNITY): Payer: Medicare Other

## 2014-06-12 ENCOUNTER — Inpatient Hospital Stay (HOSPITAL_COMMUNITY): Payer: Medicare Other | Admitting: Occupational Therapy

## 2014-06-12 LAB — CBC WITH DIFFERENTIAL/PLATELET
BASOS PCT: 0 % (ref 0–1)
Basophils Absolute: 0 10*3/uL (ref 0.0–0.1)
EOS ABS: 0 10*3/uL (ref 0.0–0.7)
EOS PCT: 0 % (ref 0–5)
HCT: 35.3 % — ABNORMAL LOW (ref 36.0–46.0)
Hemoglobin: 11 g/dL — ABNORMAL LOW (ref 12.0–15.0)
LYMPHS PCT: 90 % — AB (ref 12–46)
Lymphs Abs: 41.7 10*3/uL — ABNORMAL HIGH (ref 0.7–4.0)
MCH: 22.5 pg — AB (ref 26.0–34.0)
MCHC: 31.2 g/dL (ref 30.0–36.0)
MCV: 72.2 fL — ABNORMAL LOW (ref 78.0–100.0)
Monocytes Absolute: 0.5 10*3/uL (ref 0.1–1.0)
Monocytes Relative: 1 % — ABNORMAL LOW (ref 3–12)
Neutro Abs: 4.2 10*3/uL (ref 1.7–7.7)
Neutrophils Relative %: 9 % — ABNORMAL LOW (ref 43–77)
PLATELETS: 92 10*3/uL — AB (ref 150–400)
RBC: 4.89 MIL/uL (ref 3.87–5.11)
RDW: 14.4 % (ref 11.5–15.5)
WBC: 46.4 10*3/uL — ABNORMAL HIGH (ref 4.0–10.5)

## 2014-06-12 LAB — PATHOLOGIST SMEAR REVIEW

## 2014-06-12 LAB — COMPREHENSIVE METABOLIC PANEL
ALK PHOS: 196 U/L — AB (ref 39–117)
ALT: 84 U/L — AB (ref 0–35)
AST: 31 U/L (ref 0–37)
Albumin: 3.6 g/dL (ref 3.5–5.2)
Anion gap: 12 (ref 5–15)
BUN: 7 mg/dL (ref 6–23)
CO2: 26 mEq/L (ref 19–32)
Calcium: 8.8 mg/dL (ref 8.4–10.5)
Chloride: 100 mEq/L (ref 96–112)
Creatinine, Ser: 0.71 mg/dL (ref 0.50–1.10)
GFR calc Af Amer: >90 mL/min (ref >90)
GFR calc non Af Amer: 78 mL/min — ABNORMAL LOW (ref >90)
Glucose, Bld: 153 mg/dL — ABNORMAL HIGH (ref 70–99)
POTASSIUM: 3.9 meq/L (ref 3.7–5.3)
SODIUM: 138 meq/L (ref 137–147)
Total Bilirubin: 0.4 mg/dL (ref 0.3–1.2)
Total Protein: 6 g/dL (ref 6.0–8.3)

## 2014-06-12 MED ORDER — METHOCARBAMOL 500 MG PO TABS
500.0000 mg | ORAL_TABLET | Freq: Four times a day (QID) | ORAL | Status: DC | PRN
  Administered 2014-06-12 – 2014-06-14 (×5): 500 mg via ORAL
  Filled 2014-06-12 (×5): qty 1

## 2014-06-12 NOTE — Progress Notes (Signed)
Social Work Patient ID: Stefanie Henry, female   DOB: [birth year], 80 y.o.   MRN: [id number] Met with pt and daughter to discuss team conference goals-supervision/mod/i level and discharge date 12/19.  Both pleased with the plan, daughter to be here until next Tuesday to assist. Discussed equipment and follow up, work on discharge plans.

## 2014-06-12 NOTE — Progress Notes (Signed)
Social Work Assessment and Plan Social Work Assessment and Plan  Patient Details  Name: Quentin Strebel MRN: [id number] Date of Birth: September 23, 1929  Today's Date: 06/12/2014  Problem List:  Patient Active Problem List   Diagnosis Date Noted  . Midline low back pain without sciatica   . Back pain 06/09/2014  . Fall   . CLL (chronic lymphocytic leukemia)   . Gallstones without obstruction of gallbladder   . Lumbar compression fracture 06/08/2014  . Gall stones 01/27/2013  . Chronic lymphoid leukemia, without mention of having achieved remission(204.10) 06/06/2012  . Anemia, iron deficiency 06/06/2012   Past Medical History:  Past Medical History  Diagnosis Date  . Asthma   . Arthritis   . Shingles   . Anemia, iron deficiency 06/06/2012  . Chronic lymphatic leukemia   . Gallstones    Past Surgical History: No past surgical history on file. Social History:  reports that she has never smoked. She has never used smokeless tobacco. She reports that she does not drink alcohol or use illicit drugs.  Family / Support Systems Marital Status: Widow/Widower Patient Roles: Parent Children: [phone]  [phone] Other Supports: D-I-L   Amita-daughter  [phone] Anticipated Caregiver: Son and D-I-L Ability/Limitations of Caregiver: Son works third shift and D-I-L doesn't work and can be there 24 hr Caregiver Availability: 24/7 Family Dynamics: Close knit family pt's husband was a patient here on rehab two years ago and pt stayed here with him.  They are very supportive of one another and involved.  They will make sure she has what she needs at discharge.  Social History Preferred language: Hindi Religion: None Cultural Background: From [place] and little [place] Education: Some school in [place] Read: Yes (Native language) Write: Yes (native language) Employment Status: Retired Freight forwarder Issues: No issues Guardian/Conservator:  None-according to MD pt is capable of making her own decisions but will make sure son or daughter is here if any decisions need to be made.   Abuse/Neglect Physical Abuse: Denies Verbal Abuse: Denies Sexual Abuse: Denies Exploitation of patient/patient's resources: Denies Self-Neglect: Denies  Emotional Status Pt's affect, behavior adn adjustment status: Pt is motivated and is working hard in therapies, she wants to feel better and be able to go home soon.  Her family is here and providing support to her and interpreting along with interpreter.  She has always taken care of others so is not used to this role.   Recent Psychosocial Issues: Other health issues but was managing with cane at home Pyschiatric History: No history deferred depression screen due to doing well with this.  Will monitor and intervene if necessary-family in agreement with this. Substance Abuse History: No issues  Patient / Family Perceptions, Expectations & Goals Pt/Family understanding of illness & functional limitations: Pt and family have a good understanding of her fracture, she is doing better and is hopeful.  They speak with the MD and feel their quesitons and concerns are being addressed.  Pt will probably be a short length of stay due to high level. Premorbid pt/family roles/activities: Mother, Grandmother, Georgie Chard member, etc Anticipated changes in roles/activities/participation: resume Pt/family expectations/goals: Pt states: " I want to do what you all ask me to do."  Son states: " I am glad she is here and hope she does well here."  [employer]: Other (Comment) (had for husband) Premorbid Home Care/DME Agencies: Other (Comment) (had for husband) Transportation available at discharge: family  Discharge Planning Social Work Anticipated Follow Up  Needs: HH/OP, Support Group  Clinical Impression Family is familiar with rehab since pt's husband was here two years ago.  Pt is high  level and should be a short length of stay.  Son and dauhgter very supportive and will make sur ept has what she needs. May need to have D-I-L come in if providing assistance at home.  [name] 06/12/2014, 11:24 AM

## 2014-06-12 NOTE — Evaluation (Signed)
Physical Therapy Assessment and Plan  Patient Details  Name: Stefanie Henry MRN: [id number] Date of Birth: [birth year]  PT Diagnosis: Difficulty walking, Muscle weakness and Pain in Back Rehab Potential: Good ELOS: 7-10 days   Today's Date: 06/12/2014 PT Individual Time: 1005-1105 PT Individual Time Calculation (min): 60 min    Problem List:  Patient Active Problem List   Diagnosis Date Noted  . Midline low back pain without sciatica   . Back pain 06/09/2014  . Fall   . CLL (chronic lymphocytic leukemia)   . Gallstones without obstruction of gallbladder   . Lumbar compression fracture 06/08/2014  . Gall stones 01/27/2013  . Chronic lymphoid leukemia, without mention of having achieved remission(204.10) 06/06/2012  . Anemia, iron deficiency 06/06/2012    Past Medical History:  Past Medical History  Diagnosis Date  . Asthma   . Arthritis   . Shingles   . Anemia, iron deficiency 06/06/2012  . Chronic lymphatic leukemia   . Gallstones    Past Surgical History: No past surgical history on file.  Assessment & Plan Clinical Impression: Stefanie Henry is a 80 y.o. right handed female with history of CLL followed by oncology services Dr. [name], anemia, chronic abdominal pain. Presented 06/08/2014 after a fall 05/29/2014 while visiting her daughter in [place] with increased low back pain. Patient was independent prior to admission living with her son and daughter-in-law. Lumbar x-rays and MRI from outside hospital showed a L1 compression fracture. Hospital course conservative care with pain management. Subcutaneous heparin for DVT prophylaxis. Occupational therapy evaluation completed with recommendations of physical medicine rehabilitation consult. Patient was admitted for comprehensive rehabilitation program Patient transferred to CIR on 06/11/2014 .   Patient currently requires mod with mobility and max assist with stairs secondary to muscle weakness, decreased cardiorespiratoy  endurance and difficulty maintaining precautions.  Prior to hospitalization, patient was modified independent  with mobility and lived with Family (son and DIL) in a [place] home.  Home access is 7 with bil rails; threshold; 7 with R railStairs to enter.  Patient will benefit from skilled PT intervention to maximize safe functional mobility, minimize fall risk and decrease caregiver burden for planned discharge home with 24 hour supervision.  Anticipate patient will benefit from follow up [place] at discharge.  PT - End of Session Activity Tolerance: Tolerates < 10 min activity, no significant change in vital signs Endurance Deficit: Yes Endurance Deficit Description: Pt needed frequent seated rest breaks PT Assessment Rehab Potential (ACUTE/IP ONLY): Good Barriers to Discharge: Inaccessible home environment Barriers to Discharge Comments: Pt has difficult stair set-up to enter apartment  PT Patient demonstrates impairments in the following area(s): Balance;Endurance;Pain;Safety PT Transfers Functional Problem(s): Bed Mobility;Bed to Chair;Car;Furniture PT Locomotion Functional Problem(s): Ambulation;Stairs;Wheelchair Mobility PT Plan PT Intensity: Minimum of 1-2 x/day ,45 to 90 minutes PT Frequency: 5 out of 7 days PT Duration Estimated Length of Stay: 7-10 days PT Treatment/Interventions: Ambulation/gait training;Balance/vestibular training;Discharge planning;DME/adaptive equipment instruction;Functional mobility training;Neuromuscular re-education;Patient/family education;Pain management;Psychosocial support;Splinting/orthotics;Stair training;Therapeutic Activities;Therapeutic Exercise;UE/LE Strength taining/ROM;Wheelchair propulsion/positioning PT Transfers Anticipated Outcome(s): Supervision PT Locomotion Anticipated Outcome(s): Supervision PT Recommendation Follow Up Recommendations: Home health PT;24 hour supervision/assistance Patient destination: Home Equipment Recommended: Rolling  walker with 5" wheels;Other (comment) (Youth RW) Equipment Details: Youth RW, not likely needing WC  Skilled Therapeutic Intervention Skilled PT tx focused on pt/family orientation to PT POC and goal setting as well as functional mobility training. Pt/family reviewed back precautions. Pt needed several verbal cues throughout to avoid twisting during functional movements. Pt wearing back brace  for comfort support.   Instructed pt in rolling in bed and supine<>sit via sidelying for back support in apartment. Pt needed mod A overall and cues for effective technique. Discussed options for adjusting for comfort during the night. Performed multiple sit<>stand transfers from [facility] and bed with mod lifting assist and cues for safe reaching technique for safe lowering and steadying assist to control descent.   PT Evaluation Precautions/Restrictions Precautions Precautions: Back;Fall Precaution Comments: reviewed back precautions Required Braces or Orthoses: Other Brace/Splint Other Brace/Splint: Pt wears bil knee support braces for arthritis and uses daughter's low back support brace for comfort at times Restrictions Weight Bearing Restrictions: No General   Vital Signs  Pain - "a little," but not rated. Modified tx with increased assistance.     Home Living/Prior Functioning Home Living Available Help at Discharge: Family;Available 24 hours/day (son works nights and DIL is housewife taking care of house a) Type of Home: Apartment Home Access: Stairs to enter [employer] of Steps: 7 with bil rails; threshold; 7 with R rail Entrance Stairs-Rails: Right Home Layout: One level Additional Comments: used cane x 4-5 months to manage pain Rt knee  Lives With: Family (son and DIL) Prior Function Level of Independence: Independent with basic ADLs;Independent with transfers;Independent with gait;Independent with homemaking with ambulation  Able to Take Stairs?: Yes Comments: would walk 1  block daily with cane prior to admission Vision/Perception  Vision - History Baseline Vision: Bifocals Patient Visual Report: No change from baseline  Cognition Overall Cognitive Status: Within Functional Limits for tasks assessed Arousal/Alertness: Awake/alert Orientation Level: Oriented X4 Memory: Appears intact Awareness: Appears intact Problem Solving: Appears intact Safety/Judgment: Appears intact Comments: Needs periodic cues for no twisting Sensation Sensation Light Touch: Appears Intact Additional Comments: BUEs appear intact Coordination Gross Motor Movements are Fluid and Coordinated: Yes Fine Motor Movements are Fluid and Coordinated: Yes Motor     Mobility Bed Mobility Bed Mobility: Rolling Right;Rolling Left;Right Sidelying to Sit;Sit to Sidelying Right Rolling Right: 4: Min assist Rolling Left: 4: Min assist Right Sidelying to Sit: 3: Mod assist Sit to Sidelying Right: 3: Mod assist Sit to Sidelying Right Details (indicate cue type and reason): Pt needs assist during rolling for maintaining straight back, LE placement; and Mod A for sidelying <>sit for LE management.  Transfers Transfers: Yes Stand Pivot Transfers: 3: Mod assist Stand Pivot Transfer Details (indicate cue type and reason): Mod lifting assist need as well as guiding support for stand>sit due to need for UE support reaching back to arm rest. Locomotion  Ambulation Ambulation: Yes Ambulation/Gait Assistance: 4: Min assist Ambulation Distance (Feet): 45 Feet Assistive device: Rolling walker (Youth) Ambulation/Gait Assistance Details: Gait marked by decreased pace, short step length, and decreased knee flexion in swing. Pt needs steadying assist with RW. Stairs / Additional Locomotion Stairs: Yes Stairs Assistance: 2: Max Armed forces technical officer Details (indicate cue type and reason): Pt able to ascend stairs with Mod steadying assist during SLS phase, but needed Max support for descent due to  difficulty with eccentric control. Pt ascended forwards with 2 rails, but needed to turn sideways for descent due to knee weakness Stair Management Technique: Two rails;Step to pattern;Forwards;Sideways;One rail Left Number of Stairs: 3 Height of Stairs: 6 Wheelchair Mobility Wheelchair Mobility: Yes Wheelchair Assistance: 4: Min Lexicographer: Both upper extremities Wheelchair Parts Management: Needs assistance Distance: 25  Trunk/Postural Assessment  Cervical Assessment Cervical Assessment: Within Functional Limits Thoracic Assessment Thoracic Assessment: Within Functional Limits Lumbar Assessment Lumbar Assessment: Exceptions  to [facility] (Limited by fx and precautions) Postural Control Postural Control: Within Functional Limits  Balance Balance Balance Assessed: Yes Static Standing Balance Static Standing - Balance Support: Bilateral upper extremity supported;During functional activity Static Standing - Level of Assistance: 4: Min assist Static Standing - Comment/# of Minutes: Pt able to stand x1 min with UE support Extremity Assessment  RUE Assessment RUE Assessment: Within Functional Limits LUE Assessment LUE Assessment: Within Functional Limits RLE Assessment RLE Assessment: Exceptions to [facility] RLE AROM (degrees) RLE Overall AROM Comments: WFL, wearing knee brace RLE Strength RLE Overall Strength Comments: 3/5 throughout, not formally MMT due to back pain.  LLE Assessment LLE Assessment: Exceptions to WFL LLE AROM (degrees) LLE Overall AROM Comments: TKE limited by ~5degrees, otherwise WFL LLE Strength LLE Overall Strength Comments: 3-/5 throughout grossly, not formally tested due to back pain  FIM:  FIM - Control and instrumentation engineer Devices: Walker;Arm rests Bed/Chair Transfer: 3: Supine > Sit: Mod A (lifting assist/Pt. 50-74%/lift 2 legs;3: Sit > Supine: Mod A (lifting assist/Pt. 50-74%/lift 2 legs);3: Bed > Chair or W/C: Mod A (lift  or lower assist);3: Chair or W/C > Bed: Mod A (lift or lower assist) FIM - Locomotion: Wheelchair Distance: 25 Locomotion: Wheelchair: 1: Travels less than 50 ft with minimal assistance (Pt.>75%) FIM - Locomotion: Ambulation Locomotion: Ambulation Assistive Devices: Administrator Ambulation/Gait Assistance: 4: Min assist Locomotion: Ambulation: 1: Travels less than 50 ft with minimal assistance (Pt.>75%) FIM - Locomotion: Stairs Locomotion: Scientist, physiological: Hand rail - 2 Locomotion: Stairs: 1: Up and Down < 4 stairs with maximal assistance (Pt: 25 - 49%)   Refer to Care Plan for Long Term Goals  Recommendations for other services: None  Discharge Criteria: Patient will be discharged from PT if patient refuses treatment 3 consecutive times without medical reason, if treatment goals not met, if there is a change in medical status, if patient makes no progress towards goals or if patient is discharged from hospital.  The above assessment, treatment plan, treatment alternatives and goals were discussed and mutually agreed upon: by patient and by family  Soundra Pilon 06/12/2014, 11:28 AM

## 2014-06-12 NOTE — Care Management Note (Signed)
[facility] Individual Statement of Services  Patient Name:  Stefanie Henry  Date:  06/12/2014  Welcome to the [facility].  Our goal is to provide you with an individualized program based on your diagnosis and situation, designed to meet your specific needs.  With this comprehensive rehabilitation program, you will be expected to participate in at least 3 hours of rehabilitation therapies Monday-Friday, with modified therapy programming on the weekends.  Your rehabilitation program will include the following services:  Physical Therapy (PT), Occupational Therapy (OT), 24 hour per day rehabilitation nursing, Case Management (Social Worker), Rehabilitation Medicine, Nutrition Services and Pharmacy Services  Weekly team conferences will be held on Tuesday to discuss your progress.  Your Social Worker will talk with you frequently to get your input and to update you on team discussions.  Team conferences with you and your family in attendance may also be held.  Expected length of stay: 5-7 days  Overall anticipated outcome: supervision-mod/i level  Depending on your progress and recovery, your program may change. Your Social Worker will coordinate services and will keep you informed of any changes. Your Social Worker's name and contact numbers are listed  below.  The following services may also be recommended but are not provided by the [facility]:   [place] will be made to provide these services after discharge if needed.  Arrangements include referral to agencies that provide these services.  Your insurance has been verified to be:  Medicare & Medicaid Your primary doctor is:  Burney Gauze  Pertinent information will be shared with your doctor and your insurance company.  Social Worker:  Lennart Pall, [place] [phone] or [phone]   Information  discussed with and copy given to patient by: [name], 06/12/2014, 10:23 AM

## 2014-06-12 NOTE — Clinical Social Work Note (Signed)
Patient admitted to CIR. Clinical Social Worker will sign off for now as social work intervention is no longer needed.   Glendon Axe, MSW, LCSWA [phone] 06/12/2014 11:18 AM

## 2014-06-12 NOTE — Patient Care Conference (Signed)
Inpatient RehabilitationTeam Conference and Plan of Care Update Date: 06/12/2014   Time: 14;45 PM    Patient Name: Stefanie Henry      Medical Record Number: [id number]  Date of Birth: 1929-07-15 Sex: Female         Room/Bed: 4M09C/4M09C-01 Payor Info: Payor: MEDICARE / Plan: MEDICARE PART A AND B / Product Type: *No Product type* /    Admitting Diagnosis: L1 COMP FX  Admit Date/Time:  06/11/2014  5:50 PM Admission Comments: No comment available   Primary Diagnosis:  Lumbar compression fracture Principal Problem: Lumbar compression fracture  Patient Active Problem List   Diagnosis Date Noted  . Midline low back pain without sciatica   . Back pain 06/09/2014  . Fall   . CLL (chronic lymphocytic leukemia)   . Gallstones without obstruction of gallbladder   . Lumbar compression fracture 06/08/2014  . Gall stones 01/27/2013  . Chronic lymphoid leukemia, without mention of having achieved remission(204.10) 06/06/2012  . Anemia, iron deficiency 06/06/2012    Expected Discharge Date: Expected Discharge Date: 06/16/14  Team Members Present: Physician leading conference: Dr. Alysia Penna Social Worker Present: Ovidio Kin, LCSW Nurse Present: Elliot Cousin, RN PT Present: Canary Brim, Lorriane Shire, PT OT Present: Willeen Cass, OT;Patricia Jeannett Senior, OT SLP Present: Weston Anna, SLP PPS Coordinator present : Daiva Nakayama, RN, CRRN     Current Status/Progress Goal Weekly Team Focus  Medical   lumbar compression fx, CLL. continued pain issues  improve activity tolerance, pain control  stairs, safety awareness, posture   Bowel/Bladder   Continent of bowel and bladder; LBM 12/16  Remain continent of bowel and bladder  Offer bathroom PRN   Swallow/Nutrition/ Hydration     na        ADL's   overall min > max assist  overall mod I  ADL retraining, pain management, sit<>stands, dynamic standing, functional mobility/transfers, overall activity  tolerance/endurance   Mobility   min to mod A overall  S transfers and gait; min A stairs and bed mobility  endurance, stair negotiation, functional strengthening. adherence to back precautions, fam ed, gait   Communication     na        Safety/Cognition/ Behavioral Observations    no unsafe behaviors        Pain   Pain in lower back managed with tylenol and robaxin; patient refuses to take oxy; lumbar corsett ordered for comfort  </=4  Assess pain qshift and PRN; offer pain medication prior to therapy   Skin   No skin issues  No new skin breakdown  Assess skin qshift and PRN      *See Care Plan and progress notes for long and short-term goals.  Barriers to Discharge: pain, stairs into home    Possible Resolutions to Barriers:  strength and stair training    Discharge Planning/Teaching Needs:    Home to son's home with son, daughter and daughter in-law to assist-doing well high level.     Team Discussion:  Pain issues-adjusting meds, working on flight of stairs to go up. Goals-supervision/mod/i level.   Revisions to Treatment Plan:  New eval   Continued Need for Acute Rehabilitation Level of Care: The patient requires daily medical management by a physician with specialized training in physical medicine and rehabilitation for the following conditions: Daily direction of a multidisciplinary physical rehabilitation program to ensure safe treatment while eliciting the highest outcome that is of practical value to the patient.: Yes Daily medical management of patient  stability for increased activity during participation in an intensive rehabilitation regime.: Yes Daily analysis of laboratory values and/or radiology reports with any subsequent need for medication adjustment of medical intervention for : Post surgical problems;Other  [name] 06/14/2014, 11:41 AM

## 2014-06-12 NOTE — Progress Notes (Signed)
[facility] PHYSICAL MEDICINE & REHABILITATION     PROGRESS NOTE    Subjective/Complaints: Back sore. Able to get some sleep. Would like a muscle relaxant  Objective: Vital Signs: Blood pressure 127/60, pulse 75, temperature 98 F (36.7 C), temperature source Oral, resp. rate 17, height 4\' 11"  (1.499 m), SpO2 98 %. No results found.  Recent Labs  06/11/14 0830  WBC 49.9*  HGB 10.9*  HCT 35.2*  PLT 107*    Recent Labs  06/11/14 0830  NA 138  K 4.2  CL 100  GLUCOSE 111*  BUN 9  CREATININE 0.75  CALCIUM 8.6   CBG (last 3)  No results for input(s): GLUCAP in the last 72 hours.  Wt Readings from Last 3 Encounters:  06/09/14 58.196 kg (128 lb 4.8 oz)  11/27/13 57.153 kg (126 lb)  10/05/13 56.609 kg (124 lb 12.8 oz)    Physical Exam:  Head: Normocephalic and atraumatic.  Right Ear: External ear normal.  Left Ear: External ear normal.  Mouth/Throat: Oropharynx is clear and moist.  Poor dentition  Eyes: Conjunctivae and EOM are normal. Pupils are equal, round, and reactive to light.  Neck: Normal range of motion. Neck supple. No JVD present. No tracheal deviation present. No thyromegaly present.  Cardiovascular: Normal rate and regular rhythm. Exam reveals no gallop and no friction rub.  No murmur heard. Respiratory: Effort normal and breath sounds normal. No respiratory distress. She has no wheezes. She has no rales.  GI: Soft. Bowel sounds are normal. She exhibits no distension. There is no tenderness. There is no rebound.  Musculoskeletal:  Low back tender to palpation. Has difficulty with bed mobility and use of hip girdle muscles do to pain  Lymphadenopathy:   She has no cervical adenopathy.  Neurological: She is alert. She displays normal reflexes. No cranial nerve deficit. She exhibits normal muscle tone.  Limited English speaking. Follows commands. UES: 4/5 proximal to distal. LE: HF 2, KE 4-, ADF/APF 4/5. No sensory deficits.  Skin: Skin is warm  and dry.  Some tenderness to palpation lumbar spine  Psychiatric: She has a normal mood and affect. Her behavior is normal. Judgment and thought content normal  Assessment/Plan: 1. Functional deficits secondary to L1 compression fracture which require 3+ hours per day of interdisciplinary therapy in a comprehensive inpatient rehab setting. Physiatrist is providing close team supervision and 24 hour management of active medical problems listed below. Physiatrist and rehab team continue to assess barriers to discharge/monitor patient progress toward functional and medical goals. FIM:                   Comprehension Comprehension Mode: Auditory Comprehension: 6-Follows complex conversation/direction: With extra time/assistive device  Expression Expression Mode: Verbal Expression: 6-Expresses complex ideas: With extra time/assistive device  Social Interaction Social Interaction: 7-Interacts appropriately with others - No medications needed.  Problem Solving Problem Solving: 6-Solves complex problems: With extra time  Memory Memory: 7-Complete Independence: No helper  Medical Problem List and Plan: 1. Functional deficits secondary to L1 compression fracture after fall 2. DVT Prophylaxis/Anticoagulation: Subcutaneous heparin. Monitor platelet counts and any signs of bleeding 3. Pain Management: Neurontin 100 mg daily, Oxycodone as needed.  -add robaxin for muscle spasms 4. CLL. Follow-up oncology services  -baseline leukocytosis 5. Neuropsych: This patient is capable of making decisions on her own behalf. 6. Skin/Wound Care: Routine skin checks 7. Fluids/Electrolytes/Nutrition: Strict I and O with follow-up chemistries 8. Iron deficiency anemia. Follow-up labs 9. GERD. Protonix  LOS (  Days) 1 A FACE TO FACE EVALUATION WAS PERFORMED  SWARTZ,ZACHARY T 06/12/2014 7:52 AM    C

## 2014-06-12 NOTE — Progress Notes (Signed)
Patient information reviewed and entered into eRehab system by Evanell Redlich, RN, CRRN, PPS Coordinator.  Information including medical coding and functional independence measure will be reviewed and updated through discharge.     Per nursing patient was given "Data Collection Information Summary for Patients in Inpatient Rehabilitation Facilities with attached "Privacy Act Statement-Health Care Records" upon admission.  

## 2014-06-12 NOTE — Progress Notes (Addendum)
Physical Therapy Session Note  Patient Details  Name: Stefanie Henry MRN: [id number] Date of Birth: 01-29-1930  Today's Date: 06/12/2014 PT Individual Time: 1300-1400 PT Individual Time Calculation (min): 60 min   Short Term Goals: Week 1:  PT Short Term Goal 1 (Week 1): STG=LTG due to LOS: S overall except Min A bed mobility  Skilled Therapeutic Interventions/Progress Updates:   Session focused on overall activity tolerance, functional transfers with RW, gait training with RW with steady A 35' x 2 with cues for posture for overall functional mobility training including turning, stair negotiation with bilateral and single rail (on R going down and L going up) for home entry practice with cues for technique, Nustep for strengthening and endurance x 3 min on level 5 before c/o being too fatigued to continue, and positioning in w/c for lumbar support (used towel roll at this time and pillow because unable to locate Basic Back). Left up with daughter in room.    Goals upgraded to reflect mod I level for basic transfers and mobility; still min A for stairs.  Therapy Documentation Precautions:  Precautions Precautions: Back, Fall Precaution Comments: reviewed back precautions Required Braces or Orthoses: Other Brace/Splint Other Brace/Splint: Pt wears bil knee support braces for arthritis and uses daughter's low back support brace for comfort at times Restrictions Weight Bearing Restrictions: No  Pain:  c/o pain in low back - repositioned as able. Lumbar corset worn.     See FIM for current functional status  Therapy/Group: Individual Therapy  Canary Brim [facility] 06/12/2014, 2:14 PM

## 2014-06-12 NOTE — Evaluation (Signed)
Occupational Therapy Assessment and Plan & Session Note  Patient Details  Name: Stefanie Henry MRN: [id number] Date of Birth: 12-02-1929  OT Diagnosis: acute pain and muscle weakness (generalized) ELOS: 5-7 days   Today's Date: 06/12/2014 OT Individual Time: 2919-1660 OT Individual Time Calculation (min): 60 min     Problem List:  Patient Active Problem List   Diagnosis Date Noted  . Midline low back pain without sciatica   . Back pain 06/09/2014  . Fall   . CLL (chronic lymphocytic leukemia)   . Gallstones without obstruction of gallbladder   . Lumbar compression fracture 06/08/2014  . Gall stones 01/27/2013  . Chronic lymphoid leukemia, without mention of having achieved remission(204.10) 06/06/2012  . Anemia, iron deficiency 06/06/2012    Past Medical History:  Past Medical History  Diagnosis Date  . Asthma   . Arthritis   . Shingles   . Anemia, iron deficiency 06/06/2012  . Chronic lymphatic leukemia   . Gallstones    Past Surgical History: No past surgical history on file.  Assessment & Plan Clinical Impression: Stefanie Henry is a 80 y.o. right handed female with history of CLL followed by oncology services Dr. [name], anemia, chronic abdominal pain. Presented 06/08/2014 after a fall 05/29/2014 while visiting her daughter in [place] with increased low back pain. Patient was independent prior to admission living with her son and daughter-in-law. Lumbar x-rays and MRI from outside hospital showed a L1 compression fracture. Hospital course conservative care with pain management. Subcutaneous heparin for DVT prophylaxis. Occupational therapy evaluation completed with recommendations of physical medicine rehabilitation consult. Patient was admitted for comprehensive rehabilitation program. Patient transferred to CIR on 06/11/2014 .    Patient currently requires min >max assist with basic self-care skills secondary to muscle weakness and muscle joint tightness and decreased  sitting balance, decreased standing balance, decreased postural control, decreased balance strategies and difficulty maintaining precautions.  Prior to hospitalization, patient could complete ADLs independently.   Patient will benefit from skilled intervention to increase independence with basic self-care skills prior to discharge home with care partner.  Anticipate patient will require intermittent supervision and no further OT follow recommended.  OT - End of Session Activity Tolerance: Tolerates 10 - 20 min activity with multiple rests Endurance Deficit: Yes OT Assessment Barriers to Discharge Comments: none known at this time OT Patient demonstrates impairments in the following area(s): Balance;Endurance;Pain;Perception;Safety OT Basic ADL's Functional Problem(s): Grooming;Bathing;Dressing;Toileting OT Advanced ADL's Functional Problem(s):  (n/a at this time) OT Transfers Functional Problem(s): Toilet;Tub/Shower OT Additional Impairment(s): None OT Plan OT Intensity: Minimum of 1-2 x/day, 45 to 90 minutes OT Frequency: 5 out of 7 days OT Duration/Estimated Length of Stay: 5-7 days OT Treatment/Interventions: Balance/vestibular training;Community reintegration;Discharge planning;DME/adaptive equipment instruction;Functional mobility training;Pain management;Patient/family education;Psychosocial support;Self Care/advanced ADL retraining;Skin care/wound managment;Splinting/orthotics;Therapeutic Activities;Therapeutic Exercise;UE/LE Strength taining/ROM;Wheelchair propulsion/positioning;UE/LE Coordination activities OT Self Feeding Anticipated Outcome(s): independent OT Basic Self-Care Anticipated Outcome(s): mod I OT Toileting Anticipated Outcome(s): mod I OT Bathroom Transfers Anticipated Outcome(s): mod I  OT Recommendation Patient destination: Home Follow Up Recommendations: None Equipment Recommended: 3 in 1 bedside comode;Tub/shower bench  Skilled Therapeutic Intervention Initial  1:1 occupational therapy evaluation completed. Patient's daughter and an interpreter present secondary to language barrier. Focused skilled intervention on functional mobility, functional toilet transfer, grooming tasks standing at sink, sit<>stands, pain management, and overall activity tolerance/endurance. Therapist fitted patient > w/c, may have to adjust as needed due to patient's height. At end of session, left patient seated in w/c with all needs within reach  and daughter & interpreter present.   Precautions/Restrictions  Precautions Precautions: Back;Fall Precaution Comments: instructed in back precautions to manage pain Required Braces or Orthoses:  (patient has lumbar corset from daughter) Restrictions Weight Bearing Restrictions: No   General Chart Reviewed: Yes Family/Caregiver Present: Yes (daughter)   Vital Signs Therapy Vitals Temp: 98 F (36.7 C) Temp Source: Oral Pulse Rate: 75 Resp: 17 BP: 127/60 mmHg Patient Position (if appropriate): Lying Oxygen Therapy SpO2: 98 % O2 Device: Not Delivered   Home Living/Prior Functioning Home Living Available Help at Discharge: Family, Available 24 hours/day (son works nights and DIL is housewife taking care of house and child) Type of Home: Apartment Home Access: Stairs to enter Technical brewer of Steps: 14 Entrance Stairs-Rails: Right Home Layout: One level Additional Comments: used cane x 4-5 months to manage pain Rt knee  Lives With: Family (son and DIL) IADL History Homemaking Responsibilities: No Current License: No Occupation:  (homemaker) Leisure and Hobbies: watching tv, reading books, rituals, go for walks, traveling IADL Comments: Patient enjoys cooking at times Prior Function Level of Independence: Independent with basic ADLs, Independent with transfers, Independent with gait, Independent with homemaking with ambulation   ADL - See FIM  Vision/Perception  Vision- History Baseline Vision/History:  Wears glasses Wears Glasses: At all times Patient Visual Report: No change from baseline Vision- Assessment Vision Assessment?: No apparent visual deficits   Cognition Overall Cognitive Status: Within Functional Limits for tasks assessed Orientation Level: Oriented X4 Memory: Appears intact Awareness: Appears intact Problem Solving: Appears intact Safety/Judgment: Appears intact   Sensation Sensation Additional Comments: BUEs appear intact Coordination Gross Motor Movements are Fluid and Coordinated: Yes Fine Motor Movements are Fluid and Coordinated: Yes   Mobility  Bed Mobility Bed Mobility: Rolling Right;Rolling Left;Right Sidelying to Sit;Sit to Sidelying Right Rolling Right: 4: Min assist Rolling Left: 4: Min assist Right Sidelying to Sit: 3: Mod assist Sit to Sidelying Right: 3: Mod assist Sit to Sidelying Right Details (indicate cue type and reason): Pt needs assist during rolling for maintaining straight back, LE placement; and Mod A for sidelying <>sit for LE management.    Trunk/Postural Assessment  Cervical Assessment Cervical Assessment: Within Functional Limits Thoracic Assessment Thoracic Assessment: Within Functional Limits Lumbar Assessment Lumbar Assessment: Exceptions to [facility] (Limited by fx and precautions) Postural Control Postural Control: Within Functional Limits   Balance Balance Balance Assessed: Yes Static Standing Balance Static Standing - Balance Support: Bilateral upper extremity supported;During functional activity Static Standing - Level of Assistance: 4: Min assist Static Standing - Comment/# of Minutes: Pt able to stand x1 min with UE support   Extremity/Trunk Assessment RUE Assessment RUE Assessment: Within Functional Limits LUE Assessment LUE Assessment: Within Functional Limits  FIM:  FIM - Eating Eating Activity: 0: Activity did not occur FIM - Grooming Grooming Steps: Wash, rinse, dry face;Wash, rinse, dry hands;Oral care,  brush teeth, clean dentures;Brush, comb hair Grooming: 4: Steadying assist  or patient completes 3 of 4 or 4 of 5 steps (standing at sink) FIM - Bathing Bathing: 0: Activity did not occur FIM - Upper Body Dressing/Undressing Upper body dressing/undressing: 0: Activity did not occur FIM - Lower Body Dressing/Undressing Lower body dressing/undressing: 0: Activity did not occur FIM - Toileting Toileting steps completed by patient: Adjust clothing prior to toileting Toileting: 2: Max-Patient completed 1 of 3 steps FIM - Radio producer Devices: Elevated toilet seat;Grab bars;Walker Toilet Transfers: 4-To toilet/BSC: Min A (steadying Pt. > 75%);4-From toilet/BSC: Min A (steadying Pt. >  75%) FIM - Tub/Shower Transfers Tub/shower Transfers: 0-Activity did not occur or was simulated   Refer to Care Plan for Long Term Goals  Recommendations for other services: None  Discharge Criteria: Patient will be discharged from OT if patient refuses treatment 3 consecutive times without medical reason, if treatment goals not met, if there is a change in medical status, if patient makes no progress towards goals or if patient is discharged from hospital.  The above assessment, treatment plan, treatment alternatives and goals were discussed and mutually agreed upon: by patient  Malaky Tetrault 06/12/2014, 2:24 PM

## 2014-06-13 ENCOUNTER — Encounter (HOSPITAL_COMMUNITY): Payer: Medicare Other | Admitting: Occupational Therapy

## 2014-06-13 ENCOUNTER — Inpatient Hospital Stay (HOSPITAL_COMMUNITY): Payer: Medicare Other | Admitting: Occupational Therapy

## 2014-06-13 ENCOUNTER — Inpatient Hospital Stay (HOSPITAL_COMMUNITY): Payer: Medicare Other

## 2014-06-13 MED ORDER — BOOST / RESOURCE BREEZE PO LIQD
1.0000 | Freq: Three times a day (TID) | ORAL | Status: DC
  Administered 2014-06-13 – 2014-06-15 (×4): 1 via ORAL

## 2014-06-13 MED ORDER — PRO-STAT SUGAR FREE PO LIQD
30.0000 mL | Freq: Two times a day (BID) | ORAL | Status: DC
  Administered 2014-06-13 – 2014-06-15 (×2): 30 mL via ORAL
  Filled 2014-06-13 (×5): qty 30

## 2014-06-13 NOTE — Progress Notes (Signed)
Occupational Therapy Session Note  Patient Details  Name: Stefanie Henry MRN: [id number] Date of Birth: 17-Jul-1929  Today's Date: 06/13/2014 OT Individual Time: 9675-9163 OT Individual Time Calculation (min): 60 min    Short Term Goals: Week 1:  OT Short Term Goal 1 (Week 1): Short Term Goals = Long Term Goals   Skilled Therapeutic Interventions/Progress Updates:  Upon entering the room, pt seated in recliner chair with daughter present in room. Pt reporting 5/10 pain in lower back and described as dull ache. Pt ambulated to bathroom with RW and steady assist for toileting. Toileting performed with steady assist while pt performed clothing management and hygiene. Toilet transfer on /of with RW only and standard toilet height at steady assist. OT educated and demonstrated use of LH reacher, shoe horn, and sock aide for LB dressing. Pt returned demonstrations with practice requiring verbal cues for proper technique. Pt is familiar with items as she assisted her husband to use them previously. OT discussed safety awareness and recommendations of small stool to rest feet on during tub transfer for comfort and purchase of steady strips to decrease fall risk. Pt able to state back precautions , "I am unable to twist and bend down." OT educated pt and daughter on remaining precautions as well functional examples. Pt seated in recliner chair with respiratory therapy entering to give treatment as therapist exited the room.   Therapy Documentation Precautions:  Precautions Precautions: Back, Fall Precaution Comments: reviewed back precautions Required Braces or Orthoses: Other Brace/Splint Other Brace/Splint: Pt wears bil knee support braces for arthritis and uses daughter's low back support brace for comfort at times Restrictions Weight Bearing Restrictions: No  See FIM for current functional status  Therapy/Group: Individual Therapy  Phineas Semen 06/13/2014, 4:38 PM

## 2014-06-13 NOTE — Progress Notes (Deleted)
Orthopedic Tech Progress Note Patient Details:  Stefanie Henry September 20, 1929 [id number]  Patient ID: Stefanie Henry, female   DOB: 1929/09/20, 81 y.o.   MRN: [id number] Brace order completed by advanced   Hildred Priest 06/13/2014, 3:40 PM

## 2014-06-13 NOTE — Progress Notes (Signed)
Occupational Therapy Session Note  Patient Details  Name: Clairissa Valvano MRN: [id number] Date of Birth: [birth year]  Today's Date: 06/13/2014 OT Individual Time: 0677-0340 OT Individual Time Calculation (min): 60 min   Short Term Goals: Week 1:  OT Short Term Goal 1 (Week 1): Short Term Goals = Long Term Goals   Skilled Therapeutic Interventions/Progress Updates:  Patient received in BR with daughter present. Patient with no complaints of pain, but complaints of increased fatigue/decreased endurance. With min encouragement, patient willing to work with therapist for bathing & dressing routine. Therapist encouraged patient to ambulate > tub room for ADL retraining at shower level using bench in tub/shower unit. Patient's daughter showed concern that patient is "doing too much" and therapist educated daughter on reason and importance of therapy. Set d/c date =12/19 and therapist talked with daughter about importance of patient engaging in therapy if she wants to d/c 12/19 at mod I level. Patient took ~5 minute rest break, then performed shower transfer and UB/LB bathing in seated position. Therapist encouraged daughter to get picture of tub bench for home use. Patient dried and completed UB/LB dressing in sit<>stand position. Educated patient and daughter on AE and plan to incorporate this in future sessions. Patient transferred > w/c and patient's daughter propelled patient back to room. Therapist assisted patient back to bed, working on bed mobility. Left patient supine in bed with all needs within reach.   Precautions:  Precautions Precautions: Back, Fall Precaution Comments: reviewed back precautions Required Braces or Orthoses: Other Brace/Splint Other Brace/Splint: Pt wears bil knee support braces for arthritis and uses daughter's low back support brace for comfort at times Restrictions Weight Bearing Restrictions: No  See FIM for current functional status  Therapy/Group: Individual  Therapy  Donnamae Muilenburg 06/13/2014, 9:37 AM

## 2014-06-13 NOTE — Progress Notes (Signed)
Physical Therapy Session Note  Patient Details  Name: Stefanie Henry MRN: [id number] Date of Birth: 03/21/1930  Today's Date: 06/13/2014 PT Individual Time: 1500-1600 PT Individual Time Calculation (min): 60 min   Short Term Goals: Week 1:  PT Short Term Goal 1 (Week 1): STG=LTG due to LOS: S overall except Min A bed mobility  Skilled Therapeutic Interventions/Progress Updates:    Pt received supine in bed, agreeable to participate in therapy. Session focused on functional endurance, stair negotiation, bed mobility. Pt at overall close S to distant S for all mobility, limited by decreased endurance and pain in lower back. Pt negotiated up/down 5 stairs x3 w/ MinGuard overall, discussed with pt and Stefanie Henry various strategies for ascending/descending stairs. Pt was most comfortable with side stepping technique leading w/ R leg when ascending and leading with L leg when descending. Worked on bed mobility from flat bed with no rails, continued education on use of log roll technique and adhering to back precautions when moving supine>sit. Pt benefited from supervision for all bed mobility to ensure she maintained precautions. Pt ambulated 25' w/ RW and close S w/ close w/c follow. Session ended in pt's room where she was left seated in recliner w/ Stefanie Henry present and all needs within reach.   Therapy Documentation Precautions:  Precautions Precautions: Back, Fall Precaution Comments: reviewed back precautions Required Braces or Orthoses: Other Brace/Splint Other Brace/Splint: Pt wears bil knee support braces for arthritis and uses Stefanie Henry's low back support brace for comfort at times Restrictions Weight Bearing Restrictions: No Pain: Pain Assessment Pain Assessment: 0-10 Pain Score: 5  Pain Location: Back Pain Orientation: Lower Pain Descriptors / Indicators: Spasm;Constant Pain Frequency: Constant Pain Intervention(s): Pt pre-medicated prior to session  See FIM for current functional  status  Therapy/Group: Individual Therapy  Rada Hay  Rada Hay, PT, DPT 06/13/2014, 7:48 AM

## 2014-06-13 NOTE — Progress Notes (Signed)
INITIAL NUTRITION ASSESSMENT  DOCUMENTATION CODES Per approved criteria  -Not Applicable   INTERVENTION: Provide Resource Breeze po TID, each supplement provides 250 kcal and 9 grams of protein.  Provide 30 ml Prostat po BID, each supplement provides 100 kcal and 15 grams of protein.  Encourage adequate PO intake.  NUTRITION DIAGNOSIS: Increased nutrient needs related to chronic illness, leukemia as evidenced by estimated nutrition needs.  Goal: Pt to meet >/= 90% of their estimated nutrition needs   Monitor:  PO intake, weight trends, labs, I/O's  Reason for Assessment: Consult  80 y.o. female  Admitting Dx: Lumbar compression fracture  ASSESSMENT: Pt with history of CLL followed by oncology services Dr. [name], anemia, chronic abdominal pain. Presented 06/08/2014 after a fall 05/29/2014 while visiting her daughter in [place] with increased low back pain. Lumbar x-rays and MRI from outside hospital showed a L1 compression fracture.   RD consulted for poor po intake and the need for increased protein needs due to pt's vegetarian diet. Spoke with daughter at bedside. She reports pt has a good appetite currently and also PTA at home eating 3 full meals a day. Weight stable. Pt is a vegetarian, however can eat cheese, milk, and yogurt. Pt agreeable on Resource Breeze and Prostat for added calorie and protein needs. Pt was encouraged to eat her food at meals. Daughter was educated to order foods that contain protein at meals, such as yogurt, extra milk, and cheese slices.   Pt with no observed significant fat or muscle mass loss.  Labs: Low GFR. High ALT and alkaline phosphatase.   Height: Ht Readings from Last 1 Encounters:  06/11/14 4\' 11"  (1.499 m)    Weight: Wt Readings from Last 1 Encounters:  06/13/14 134 lb 4.8 oz (60.918 kg)    Ideal Body Weight: 98 lbs  % Ideal Body Weight: 137%  Wt Readings from Last 10 Encounters:  06/13/14 134 lb 4.8 oz (60.918 kg)   06/09/14 128 lb 4.8 oz (58.196 kg)  11/27/13 126 lb (57.153 kg)  10/05/13 124 lb 12.8 oz (56.609 kg)  01/29/13 109 lb (49.442 kg)  01/25/13 109 lb (49.442 kg)  01/16/13 110 lb (49.896 kg)  01/04/13 112 lb (50.803 kg)  12/08/12 112 lb (50.803 kg)  09/14/12 114 lb (51.71 kg)    Usual Body Weight: 130 lbs  % Usual Body Weight: 103%  BMI:  Body mass index is 27.11 kg/(m^2).  Estimated Nutritional Needs: Kcal: 1750-1950 Protein: 75-90 grams Fluid: 1.75 - 1.9 L/day  Skin: intact  Diet Order: Diet vegetarian  EDUCATION NEEDS: -No education needs identified at this time   Intake/Output Summary (Last 24 hours) at 06/13/14 1217 Last data filed at 06/13/14 0930  Gross per 24 hour  Intake    360 ml  Output      2 ml  Net    358 ml    Last BM: 12/14  Labs:   Recent Labs Lab 06/09/14 0545 06/11/14 0830 06/12/14 0820  NA 138 138 138  K 4.4 4.2 3.9  CL 103 100 100  CO2 22 28 26   BUN 10 9 7   CREATININE 0.57 0.75 0.71  CALCIUM 8.4 8.6 8.8  GLUCOSE 110* 111* 153*    CBG (last 3)  No results for input(s): GLUCAP in the last 72 hours.  Scheduled Meds: . acidophilus  1 capsule Oral Daily  . calcitonin (salmon)  1 spray Alternating Nares Daily  . docusate sodium  100 mg Oral Daily  . famotidine  20 mg Oral Daily  . gabapentin  100 mg Oral Daily  . pantoprazole  40 mg Oral Daily  . predniSONE  10 mg Oral Q breakfast  . senna  2 tablet Oral q morning - 10a  . simethicone  80 mg Oral Daily    Continuous Infusions:   Past Medical History  Diagnosis Date  . Asthma   . Arthritis   . Shingles   . Anemia, iron deficiency 06/06/2012  . Chronic lymphatic leukemia   . Gallstones     No past surgical history on file.  Kallie Locks, MS, RD, LDN Pager # [phone] After hours/ weekend pager # [phone]

## 2014-06-13 NOTE — Progress Notes (Signed)
[facility] PHYSICAL MEDICINE & REHABILITATION     PROGRESS NOTE    Subjective/Complaints: Back still sore but had a good day yesterday. Able to sleep.   Objective: Vital Signs: Blood pressure 140/69, pulse 90, temperature 98.1 F (36.7 C), temperature source Oral, resp. rate 18, height 4\' 11"  (1.499 m), SpO2 100 %. No results found.  Recent Labs  06/11/14 0830 06/12/14 0820  WBC 49.9* 46.4*  HGB 10.9* 11.0*  HCT 35.2* 35.3*  PLT 107* 92*    Recent Labs  06/11/14 0830 06/12/14 0820  NA 138 138  K 4.2 3.9  CL 100 100  GLUCOSE 111* 153*  BUN 9 7  CREATININE 0.75 0.71  CALCIUM 8.6 8.8   CBG (last 3)  No results for input(s): GLUCAP in the last 72 hours.  Wt Readings from Last 3 Encounters:  06/09/14 58.196 kg (128 lb 4.8 oz)  11/27/13 57.153 kg (126 lb)  10/05/13 56.609 kg (124 lb 12.8 oz)    Physical Exam:  Head: Normocephalic and atraumatic.  Right Ear: External ear normal.  Left Ear: External ear normal.  Mouth/Throat: Oropharynx is clear and moist.  Poor dentition  Eyes: Conjunctivae and EOM are normal. Pupils are equal, round, and reactive to light.  Neck: Normal range of motion. Neck supple. No JVD present. No tracheal deviation present. No thyromegaly present.  Cardiovascular: Normal rate and regular rhythm. Exam reveals no gallop and no friction rub.  No murmur heard. Respiratory: Effort normal and breath sounds normal. No respiratory distress. She has no wheezes. She has no rales.  GI: Soft. Bowel sounds are normal. She exhibits no distension. There is no tenderness. There is no rebound.  Musculoskeletal:  Low back tender with palpation and with use of hip girdle muscles Lymphadenopathy:   She has no cervical adenopathy.  Neurological: She is alert. She displays normal reflexes. No cranial nerve deficit. She exhibits normal muscle tone.  Limited English speaking. Follows commands. UES: 4/5 proximal to distal. LE: HF 2, KE 4-, ADF/APF 4/5.  No sensory deficits.  Skin: Skin is warm and dry.  Some tenderness to palpation lumbar spine  Psychiatric: She has a normal mood and affect. Her behavior is normal. Judgment and thought content normal  Assessment/Plan: 1. Functional deficits secondary to L1 compression fracture which require 3+ hours per day of interdisciplinary therapy in a comprehensive inpatient rehab setting. Physiatrist is providing close team supervision and 24 hour management of active medical problems listed below. Physiatrist and rehab team continue to assess barriers to discharge/monitor patient progress toward functional and medical goals. FIM: FIM - Bathing Bathing: 0: Activity did not occur  FIM - Upper Body Dressing/Undressing Upper body dressing/undressing: 0: Activity did not occur FIM - Lower Body Dressing/Undressing Lower body dressing/undressing: 0: Activity did not occur  FIM - Toileting Toileting steps completed by patient: Adjust clothing prior to toileting Toileting: 2: Max-Patient completed 1 of 3 steps  FIM - Radio producer Devices: Elevated toilet seat, Grab bars, Insurance account manager Transfers: 4-To toilet/BSC: Min A (steadying Pt. > 75%), 4-From toilet/BSC: Min A (steadying Pt. > 75%)  FIM - Bed/Chair Transfer Bed/Chair Transfer Assistive Devices: Walker, Arm rests Bed/Chair Transfer: 3: Supine > Sit: Mod A (lifting assist/Pt. 50-74%/lift 2 legs, 3: Sit > Supine: Mod A (lifting assist/Pt. 50-74%/lift 2 legs), 3: Bed > Chair or W/C: Mod A (lift or lower assist), 3: Chair or W/C > Bed: Mod A (lift or lower assist)  FIM - Locomotion: Wheelchair Distance: 25  Locomotion: Wheelchair: 1: Travels less than 50 ft with minimal assistance (Pt.>75%) FIM - Locomotion: Ambulation Locomotion: Ambulation Assistive Devices: Administrator Ambulation/Gait Assistance: 4: Min assist Locomotion: Ambulation: 1: Travels less than 50 ft with minimal assistance  (Pt.>75%)  Comprehension Comprehension Mode: Auditory Comprehension: 6-Follows complex conversation/direction: With extra time/assistive device  Expression Expression Mode: Verbal Expression: 6-Expresses complex ideas: With extra time/assistive device  Social Interaction Social Interaction: 7-Interacts appropriately with others - No medications needed.  Problem Solving Problem Solving: 6-Solves complex problems: With extra time  Memory Memory: 7-Complete Independence: No helper  Medical Problem List and Plan: 1. Functional deficits secondary to L1 compression fracture after fall 2. DVT Prophylaxis/Anticoagulation: Subcutaneous heparin. Monitor platelet counts and any signs of bleeding 3. Pain Management: Neurontin 100 mg daily, Oxycodone as needed.  -added robaxin for muscle spasms  -soft lumbar corset for comfort 4. CLL. Follow-up oncology services  -baseline leukocytosis 5. Neuropsych: This patient is capable of making decisions on her own behalf. 6. Skin/Wound Care: Routine skin checks 7. Fluids/Electrolytes/Nutrition: Strict I and O with follow-up chemistries 8. Iron deficiency anemia. Follow-up labs 9. GERD. Protonix  LOS (Days) 2 A FACE TO FACE EVALUATION WAS PERFORMED  Madigan [employer] T 06/13/2014 7:48 AM    C

## 2014-06-14 ENCOUNTER — Inpatient Hospital Stay (HOSPITAL_COMMUNITY): Payer: Medicare Other | Admitting: Occupational Therapy

## 2014-06-14 ENCOUNTER — Inpatient Hospital Stay (HOSPITAL_COMMUNITY): Payer: Medicare Other

## 2014-06-14 ENCOUNTER — Telehealth: Payer: Self-pay | Admitting: Hematology & Oncology

## 2014-06-14 ENCOUNTER — Encounter (HOSPITAL_COMMUNITY): Payer: Medicare Other | Admitting: Occupational Therapy

## 2014-06-14 MED ORDER — METHOCARBAMOL 500 MG PO TABS
250.0000 mg | ORAL_TABLET | Freq: Four times a day (QID) | ORAL | Status: DC | PRN
  Administered 2014-06-14: 250 mg via ORAL
  Filled 2014-06-14: qty 1

## 2014-06-14 NOTE — Progress Notes (Signed)
Orthopedic Tech Progress Note Patient Details:  Stefanie Henry 1929/07/17 [id number]  Ortho Devices Type of Ortho Device: Lumbar corsett Ortho Device/Splint Location: bavk/abdomen Ortho Device/Splint Interventions: Application Incorrect ortho charge of aspen lumbar corsett was applied to pt's charge bill and it should be not be charged to pt  [employer] 06/14/2014, 9:16 AM

## 2014-06-14 NOTE — Progress Notes (Signed)
Orthopedic Tech Progress Note Patient Details:  Stefanie Henry [birth year] [id number] abd binder returned patient did not use. Patient ID: Stefanie Henry, female   DOB: [birth year], 80 y.o.   MRN: [id number]   [name] 06/14/2014, 3:19 PM

## 2014-06-14 NOTE — IPOC Note (Signed)
Overall Plan of Care [facility]) Patient Details Name: Stefanie Henry MRN: [id number] DOB: [birth year]  Admitting Diagnosis: L1 COMP FX  Hospital Problems: Principal Problem:   Lumbar compression fracture Active Problems:   Anemia, iron deficiency   Fall   CLL (chronic lymphocytic leukemia)     Functional Problem List: Nursing Endurance, Medication Management, Pain, Safety, Skin Integrity  PT Balance, Endurance, Pain, Safety  OT Balance, Endurance, Pain, Perception, Safety  SLP    TR         Basic ADL's: OT Grooming, Bathing, Dressing, Toileting     Advanced  ADL's: OT  (n/a at this time)     Transfers: PT Bed Mobility, Bed to Chair, Car, Manufacturing systems engineer, Metallurgist: PT Ambulation, Data processing manager, Emergency planning/management officer     Additional Impairments: OT None  SLP        TR      Anticipated Outcomes Item Anticipated Outcome  Self Feeding independent  Swallowing      Basic self-care  mod I  Toileting  mod I   Bathroom Transfers mod I   Bowel/Bladder  Patient will be continent of bowel and bladder with min assist  Transfers  Supervision  Locomotion  Supervision  Communication     Cognition     Pain  Patient's pain will equal to or less than 3 on a scale of 0-10    Safety/Judgment  Patient will be free from falls/injury with min assist   Therapy Plan: PT Intensity: Minimum of 1-2 x/day ,45 to 90 minutes PT Frequency: 5 out of 7 days PT Duration Estimated Length of Stay: 7-10 days OT Intensity: Minimum of 1-2 x/day, 45 to 90 minutes OT Frequency: 5 out of 7 days OT Duration/Estimated Length of Stay: 5-7 days         Team Interventions: Nursing Interventions Patient/Family Education, Pain Management, Medication Management, Skin Care/Wound Management, Discharge Planning  PT interventions Ambulation/gait training, Balance/vestibular training, Discharge planning, DME/adaptive equipment instruction, Functional mobility training, Neuromuscular  re-education, Patient/family education, Pain management, Psychosocial support, Splinting/orthotics, Stair training, Therapeutic Activities, Therapeutic Exercise, UE/LE Strength taining/ROM, Wheelchair propulsion/positioning  OT Interventions Training and development officer, Community reintegration, Discharge planning, DME/adaptive equipment instruction, Functional mobility training, Pain management, Patient/family education, Psychosocial support, Self Care/advanced ADL retraining, Skin care/wound managment, Splinting/orthotics, Therapeutic Activities, Therapeutic Exercise, UE/LE Strength taining/ROM, Wheelchair propulsion/positioning, UE/LE Coordination activities  SLP Interventions    TR Interventions    SW/CM Interventions Discharge Planning, Psychosocial Support, Patient/Family Education    Team Discharge Planning: Destination: PT-Home ,OT- Home , SLP-  Projected Follow-up: PT-Home health PT, 24 hour supervision/assistance, OT-  None, SLP-  Projected Equipment Needs: PT-Rolling walker with 5" wheels, Other (comment) (Youth RW), OT- 3 in 1 bedside comode, Tub/shower bench, SLP-  Equipment Details: PT-Youth RW, not likely needing WC, OT-  Patient/family involved in discharge planning: PT- Patient, Family member/caregiver,  OT-Patient, SLP-   MD ELOS: 7-10d Medical Rehab Prognosis:  Good Assessment: 80 y.o. right handed female with history of CLL followed by oncology services Dr. [name], anemia, chronic abdominal pain. Presented 06/08/2014 after a fall 05/29/2014 while visiting her daughter in [place] with increased low back pain. Patient was independent prior to admission living with her son and daughter-in-law. Lumbar x-rays and MRI from outside hospital showed a L1 compression fracture.    Now requiring 24/7 Rehab RN,MD, as well as CIR level PT, OT and SLP.  Treatment team will focus on ADLs and mobility with goals set at [facility] I/Sup  See Team Conference Notes for weekly updates to the plan of  care

## 2014-06-14 NOTE — Progress Notes (Signed)
[place] PHYSICAL MEDICINE & REHABILITATION     PROGRESS NOTE    Subjective/Complaints: Robaxin helped but makes her feel drowsy. Feels swimmy headed  Objective: Vital Signs: Blood pressure 120/56, pulse 72, temperature 98.4 F (36.9 C), temperature source Oral, resp. rate 16, height 4\' 11"  (1.499 m), weight 60.918 kg (134 lb 4.8 oz), SpO2 98 %. No results found.  Recent Labs  06/11/14 0830 06/12/14 0820  WBC 49.9* 46.4*  HGB 10.9* 11.0*  HCT 35.2* 35.3*  PLT 107* 92*    Recent Labs  06/11/14 0830 06/12/14 0820  NA 138 138  K 4.2 3.9  CL 100 100  GLUCOSE 111* 153*  BUN 9 7  CREATININE 0.75 0.71  CALCIUM 8.6 8.8   CBG (last 3)  No results for input(s): GLUCAP in the last 72 hours.  Wt Readings from Last 3 Encounters:  06/13/14 60.918 kg (134 lb 4.8 oz)  06/09/14 58.196 kg (128 lb 4.8 oz)  11/27/13 57.153 kg (126 lb)    Physical Exam:  Head: Normocephalic and atraumatic.  Right Ear: External ear normal.  Left Ear: External ear normal.  Mouth/Throat: Oropharynx is clear and moist.  Poor dentition  Eyes: Conjunctivae and EOM are normal. Pupils are equal, round, and reactive to light.  Neck: Normal range of motion. Neck supple. No JVD present. No tracheal deviation present. No thyromegaly present.  Cardiovascular: Normal rate and regular rhythm. Exam reveals no gallop and no friction rub.  No murmur heard. Respiratory: Effort normal and breath sounds normal. No respiratory distress. She has no wheezes. She has no rales.  GI: Soft. Bowel sounds are normal. She exhibits no distension. There is no tenderness. There is no rebound.  Musculoskeletal:  Low back less tender Lymphadenopathy:   She has no cervical adenopathy.  Neurological: She appears a little sedated. She displays normal reflexes. No cranial nerve deficit. She exhibits normal muscle tone.  Limited English speaking. Follows commands. UES: 4/5 proximal to distal. LE: HF 2, KE 4-, ADF/APF  4/5. No sensory deficits.  Skin: Skin is warm and dry.  Some tenderness to palpation lumbar spine  Psychiatric: She has a normal mood and affect. Her behavior is normal. Judgment and thought content normal  Assessment/Plan: 1. Functional deficits secondary to L1 compression fracture which require 3+ hours per day of interdisciplinary therapy in a comprehensive inpatient rehab setting. Physiatrist is providing close team supervision and 24 hour management of active medical problems listed below. Physiatrist and rehab team continue to assess barriers to discharge/monitor patient progress toward functional and medical goals. FIM: FIM - Bathing Bathing: 0: Activity did not occur  FIM - Upper Body Dressing/Undressing Upper body dressing/undressing: 0: Activity did not occur FIM - Lower Body Dressing/Undressing Lower body dressing/undressing: 0: Activity did not occur  FIM - Toileting Toileting steps completed by patient: Performs perineal hygiene, Adjust clothing prior to toileting, Adjust clothing after toileting Toileting: 4: Steadying assist  FIM - Radio producer Devices: Elevated toilet seat, Grab bars, Insurance account manager Transfers: 4-To toilet/BSC: Min A (steadying Pt. > 75%), 4-From toilet/BSC: Min A (steadying Pt. > 75%)  FIM - Bed/Chair Transfer Bed/Chair Transfer Assistive Devices: Walker, Arm rests Bed/Chair Transfer: 4: Supine > Sit: Min A (steadying Pt. > 75%/lift 1 leg), 5: Bed > Chair or W/C: Supervision (verbal cues/safety issues), 5: Chair or W/C > Bed: Supervision (verbal cues/safety issues)  FIM - Locomotion: Wheelchair Distance: 25 Locomotion: Wheelchair: 1: Travels less than 50 ft with supervision, cueing or coaxing  FIM - Locomotion: Ambulation Locomotion: Ambulation Assistive Devices: Administrator Ambulation/Gait Assistance: 5: Supervision Locomotion: Ambulation: 1: Travels less than 50 ft with supervision/safety  issues  Comprehension Comprehension Mode: Auditory Comprehension: 6-Follows complex conversation/direction: With extra time/assistive device  Expression Expression Mode: Verbal Expression: 6-Expresses complex ideas: With extra time/assistive device  Social Interaction Social Interaction: 7-Interacts appropriately with others - No medications needed.  Problem Solving Problem Solving: 6-Solves complex problems: With extra time  Memory Memory: 7-Complete Independence: No helper  Medical Problem List and Plan: 1. Functional deficits secondary to L1 compression fracture after fall 2. DVT Prophylaxis/Anticoagulation: Subcutaneous heparin. Monitor platelet counts and any signs of bleeding 3. Pain Management: Neurontin 100 mg daily, Oxycodone as needed.  -added robaxin for muscle spasms  -ordered a soft lumbar corset for comfort yesterday and pt received a rigid post-op brace   -this needs to be returned and soft corset delivered 4. CLL. Follow-up oncology services  -baseline leukocytosis 5. Neuropsych: This patient is capable of making decisions on her own behalf. 6. Skin/Wound Care: Routine skin checks 7. Fluids/Electrolytes/Nutrition: Strict I and O with follow-up chemistries 8. Iron deficiency anemia. Follow-up labs 9. GERD. Protonix  LOS (Days) 3 A FACE TO FACE EVALUATION WAS PERFORMED  SWARTZ,ZACHARY T 06/14/2014 8:00 AM    C

## 2014-06-14 NOTE — Discharge Summary (Signed)
Discharge summary job # [id number]

## 2014-06-14 NOTE — Progress Notes (Signed)
Social Work [name], [place] Social Worker Signed  Patient Care Conference 06/12/2014  3:56 PM    Expand All Collapse All   Inpatient RehabilitationTeam Conference and Plan of Care Update Date: 06/12/2014   Time: 14;45 PM     Patient Name: Stefanie Henry       Medical Record Number: [id number]  Date of Birth: 06-19-1930 Sex: Female         Room/Bed: 4M09C/4M09C-01 Payor Info: Payor: MEDICARE / Plan: MEDICARE PART A AND B / Product Type: *No Product type* /    Admitting Diagnosis: L1 COMP FX   Admit Date/Time:  06/11/2014  5:50 PM Admission Comments: No comment available   Primary Diagnosis:  Lumbar compression fracture Principal Problem: Lumbar compression fracture    Patient Active Problem List     Diagnosis  Date Noted   .  Midline low back pain without sciatica     .  Back pain  06/09/2014   .  Fall     .  CLL (chronic lymphocytic leukemia)     .  Gallstones without obstruction of gallbladder     .  Lumbar compression fracture  06/08/2014   .  Gall stones  01/27/2013   .  Chronic lymphoid leukemia, without mention of having achieved remission(204.10)  06/06/2012   .  Anemia, iron deficiency  06/06/2012     Expected Discharge Date: Expected Discharge Date: 06/16/14  Team Members Present: Physician leading conference: Dr. Alysia Penna Social Worker Present: Ovidio Kin, LCSW Nurse Present: Elliot Cousin, RN PT Present: Canary Brim, Lorriane Shire, PT OT Present: Willeen Cass, OT;Patricia Jeannett Senior, OT SLP Present: Weston Anna, SLP PPS Coordinator present : Daiva Nakayama, RN, CRRN        Current Status/Progress  Goal  Weekly Team Focus   Medical     lumbar compression fx, CLL. continued pain issues   improve activity tolerance, pain control   stairs, safety awareness, posture   Bowel/Bladder     Continent of bowel and bladder; LBM 12/16   Remain continent of bowel and bladder   Offer bathroom PRN   Swallow/Nutrition/ Hydration       na          ADL's     overall min > max assist  overall mod I  ADL retraining, pain management, sit<>stands, dynamic standing, functional mobility/transfers, overall activity tolerance/endurance   Mobility     min to mod A overall  S transfers and gait; min A stairs and bed mobility   endurance, stair negotiation, functional strengthening. adherence to back precautions, fam ed, gait   Communication       na         Safety/Cognition/ Behavioral Observations      no unsafe behaviors         Pain     Pain in lower back managed with tylenol and robaxin; patient refuses to take oxy; lumbar corsett ordered for comfort   </=4  Assess pain qshift and PRN; offer pain medication prior to therapy   Skin     No skin issues  No new skin breakdown  Assess skin qshift and PRN      *See Care Plan and progress notes for long and short-term goals.    Barriers to Discharge:  pain, stairs into home     Possible Resolutions to Barriers:   strength and stair training     Discharge Planning/Teaching Needs:     Home to son's home  with son, daughter and daughter in-law to assist-doing well high level.      Team Discussion:    Pain issues-adjusting meds, working on flight of stairs to go up. Goals-supervision/mod/i level.    Revisions to Treatment Plan:    New eval    Continued Need for Acute Rehabilitation Level of Care: The patient requires daily medical management by a physician with specialized training in physical medicine and rehabilitation for the following conditions: Daily direction of a multidisciplinary physical rehabilitation program to ensure safe treatment while eliciting the highest outcome that is of practical value to the patient.: Yes Daily medical management of patient stability for increased activity during participation in an intensive rehabilitation regime.: Yes Daily analysis of laboratory values and/or radiology reports with any subsequent need for medication adjustment of medical  intervention for : Post surgical problems;Other  [name] 06/14/2014, 11:41 AM                  Patient ID: Stefanie Henry, female   DOB: 02/03/1930, 80 y.o.   MRN: [id number]

## 2014-06-14 NOTE — Telephone Encounter (Signed)
Becky SW from [facility] scheduled 1-6 hospital follow up and will let pt know

## 2014-06-14 NOTE — Progress Notes (Signed)
Physical Therapy Session Note  Patient Details  Name: Stefanie Henry MRN: [id number] Date of Birth: July 16, 1929  Today's Date: 06/14/2014 PT Individual Time: 1500-1530 PT Individual Time Calculation (min): 30 min   Short Term Goals: Week 1:  PT Short Term Goal 1 (Week 1): STG=LTG due to LOS: S overall except Min A bed mobility  Skilled Therapeutic Interventions/Progress Updates:   Session focused on functional mobility within pt's room performing toileting tasks, hand hygiene at the sink in standing and transfer to w/c with RW. In ADL apartment focused on household mobility and transfers to furniture to simulate home environment as well as bed mobility in soft ADL bed. Cues initially for supine <-> sit to maintain precautions but performed transfers mod I from surfaces. Gait back to room with RW for endurance and strengthening approaching mod I with RW. Pt's RW ordered was in pt room and therapist adjusted to correct height. Name tag placed on pt's walker as not to be confused with hospital equipment (removed this RW from room).   Therapy Documentation Precautions:  Precautions Precautions: Back, Fall Precaution Comments: reviewed back precautions Required Braces or Orthoses: Other Brace/Splint Other Brace/Splint: Pt wears bil knee support braces for arthritis and uses daughter's low back support brace for comfort at times Restrictions Weight Bearing Restrictions: No  Pain:  Rest and repositioning to address back discomfort. Pt not using lumbar corset this PM.   See FIM for current functional status  Therapy/Group: Individual Therapy  Canary Brim [facility] 06/14/2014, 3:42 PM

## 2014-06-14 NOTE — Progress Notes (Signed)
Physical Therapy Session Note  Patient Details  Name: Stefanie Henry MRN: [id number] Date of Birth: 1929-10-15  Today's Date: 06/14/2014 PT Individual Time: 0930-1030 PT Individual Time Calculation (min): 60 min   Short Term Goals: Week 1:  PT Short Term Goal 1 (Week 1): STG=LTG due to LOS: S overall except Min A bed mobility  Skilled Therapeutic Interventions/Progress Updates:   Focused on stair negotiation for home entry simulation up/down 6 steps (shorter) and then 6 more steps (taller) using L rail ascending and descending (rail on L) step-to pattern with close S/steady A. Pt and daughter feel comfortable with this. Recommended placing a chair on the platform if needed for rest break at home and both in agreement. Introduced [place] for LE strengthening and balance with pt performing 10 reps each BLE for standing hip abduction, hamstring curls, LAQ, mini squats, and heel raises - handout provided as well and gave to daughter at end of session. Simulated car transfer with S using RW; cues to maintain back precautions when bringing LE's into the car not to twist. Gait for endurance, strengthening, and functional mobility training x 100' with overall S before requiring rest break due to SOB. Positioned in w/c for comfort end of session with pt's daughter in the room.   Therapy Documentation Precautions:  Precautions Precautions: Back, Fall Precaution Comments: reviewed back precautions Required Braces or Orthoses: Other Brace/Splint Other Brace/Splint: Pt wears bil knee support braces for arthritis and uses daughter's low back support brace for comfort at times Restrictions Weight Bearing Restrictions: No Pain: Pain Assessment Pain Assessment: 0-10 Pain Score: 3  Pain Location: Back Pain Orientation: Lower Pain Descriptors / Indicators: Aching Pain Frequency: Intermittent Pain Onset: With Activity Patients Stated Pain Goal: 3 Pain Intervention(s): Repositioned;Emotional  support Multiple Pain Sites: No  See FIM for current functional status  Therapy/Group: Individual Therapy  Canary Brim [facility] 06/14/2014, 11:46 AM

## 2014-06-14 NOTE — Progress Notes (Signed)
Occupational Therapy Session Notes  Patient Details  Name: Stefanie Henry MRN: [id number] Date of Birth: [birth year]  Today's Date: 06/14/2014  Short Term Goals: Week 1:  OT Short Term Goal 1 (Week 1): Short Term Goals = Long Term Goals   Skilled Therapeutic Interventions/Progress Updates:   Session #1 [id number] - 50 Minutes Individual Therapy No complaints of pain, but with complaints of discomfort in back; RN aware  Patient received seated in recliner with daughter present. Patient's daughter with house hold measurements. Therapist checked measurements and recommended patient use walk-in shower secondary to tub/shower too high for patient's recommended height of tub transfer bench. Patient engaged in functional ambulation around room using RW, safely performing side steps prn without any cues from therapist. Patient performed toilet transfer with supervision using RW and grab bars prn. Patient then practiced shower stall transfer, daughter stated she understood recommendation of walk-in shower as opposed to tub/shower at this time. Patient then worked on doffing and donning of clothes from a sit<>stand position using RW and AE prn. Therapist educated patient and daughter on RW safety, not carrying anything and using reacher prn. Plan to administer patient reacher bag and walker bag. Therapist assisted with donning of back brace brought by ortho tech. Brace too big for patient, notified NT to notify RN of this. At end of session, left patient seated in recliner with daughter present and PT entering room.  Session #2 [id number] - 30 Minutes Individual Therapy No complaints of pain Patient received seated in recliner with daughter present. Therapist administered walker bag. Patient transferred into w/c and therapist propelled patient > laundry room. Patient engaged in home management task of putting clothes into washer. Therapist educated patient and daughter again on safest/most effective way to  perform tasks and importance of not carrying anything in hands when walking with RW. Therapist assisted patient > ADL apartment, focused on functional ambulation on different floor types, furniture transfer, and overall safety with RW. Therapist educated patient and daughter on kitchen task. Left patient with daughter assisting her back to room.   Precautions:  Precautions Precautions: Back, Fall Precaution Comments: reviewed back precautions Required Braces or Orthoses: Other Brace/Splint Other Brace/Splint: Pt wears bil knee support braces for arthritis and uses daughter's low back support brace for comfort at times Restrictions Weight Bearing Restrictions: No  See FIM for current functional status  Stefanie Henry 06/14/2014, 7:27 AM

## 2014-06-15 ENCOUNTER — Encounter (HOSPITAL_COMMUNITY): Payer: Self-pay | Admitting: *Deleted

## 2014-06-15 ENCOUNTER — Encounter (HOSPITAL_COMMUNITY): Payer: Medicare Other | Admitting: Occupational Therapy

## 2014-06-15 ENCOUNTER — Inpatient Hospital Stay (HOSPITAL_COMMUNITY): Payer: Medicare Other

## 2014-06-15 MED ORDER — CALCITONIN (SALMON) 200 UNIT/ACT NA SOLN: 1.0000 | Freq: Every day | NASAL | Status: DC

## 2014-06-15 MED ORDER — FAMOTIDINE 20 MG PO TABS: 20.0000 mg | ORAL_TABLET | Freq: Every day | ORAL | Status: DC

## 2014-06-15 MED ORDER — OXYCODONE HCL 5 MG PO TABS: 5.0000 mg | ORAL_TABLET | ORAL | Status: DC | PRN

## 2014-06-15 MED ORDER — METHOCARBAMOL 500 MG PO TABS: 250.0000 mg | ORAL_TABLET | Freq: Four times a day (QID) | ORAL | Status: DC | PRN

## 2014-06-15 MED ORDER — GABAPENTIN 100 MG PO CAPS: 100.0000 mg | ORAL_CAPSULE | Freq: Every day | ORAL | Status: DC

## 2014-06-15 NOTE — Progress Notes (Signed)
Occupational Therapy Session Note  Patient Details  Name: Stefanie Henry MRN: [id number] Date of Birth: 1930-04-06  Today's Date: 06/15/2014 OT Individual Time: 1001-1101 OT Individual Time Calculation (min): 60 min    Short Term Goals: Week 1:  OT Short Term Goal 1 (Week 1): Short Term Goals = Long Term Goals   Skilled Therapeutic Interventions/Progress Updates:  Upon entering the room, pt seated in recliner chair awaiting therapist with daughter present in the room. Pt reporting 5/10 pain in lower back with reports of taking medication recently. Patient's daughter assisted in interpreting education and instruction as pt with limited english. Pt verbalizing back precautions with 100% accuracy this session. Pt ambulating with use of RW to obtain clothing and all needed items for B & D session. Pt performing shower transfer onto TTB with use of RW with supervision and utilized LH bath sponge in order to wash B feet and back. Pt utilized LH reacher, sock aide, and shoe horn for dressing tasks independently with increased time in order to maintain precautions. Pt utilized small stool for feet to rest on for comfort secondary to petite stature. Daughter present to provide supervision for transfer and no other concerns this session. Pt seated in recliner chair awaiting discharge instructions after session.    Therapy Documentation Precautions:  Precautions Precautions: Back, Fall Precaution Comments: reviewed back precautions Required Braces or Orthoses: Other Brace/Splint Other Brace/Splint: Pt wears bil knee support braces for arthritis and uses daughter's low back support brace for comfort at times Restrictions Weight Bearing Restrictions: No  See FIM for current functional status  Therapy/Group: Individual Therapy  Phineas Semen 06/15/2014, 12:57 PM

## 2014-06-15 NOTE — Progress Notes (Signed)
Physical Therapy Session Note  Patient Details  Name: Stefanie Henry MRN: [id number] Date of Birth: 11-07-1929  Today's Date: 06/15/2014 PT Individual Time: 0800-0900 PT Individual Time Calculation (min): 60 min   Short Term Goals: Week 1:  PT Short Term Goal 1 (Week 1): STG=LTG due to LOS: S overall except Min A bed mobility  Skilled Therapeutic Interventions/Progress Updates:   Pt and daughter request to leave today after therapy today and PA aware. Therapist in agreement. Focused on performing grad day activities, family education, and d/c assessment (see d/c summary for details). Stair negotiation with overall S up/down flight of stairs to simulate home entry going sideways up/down using 1 rail to navigate. Discussed in detail back precautions in regards to bed mobility, sleeping positions, and recommendation for activity/exercise at d/c (pt's daughter reports pt sits for hours at a time) with education on pressure relief and increased back pain caused due to extended sitting periods. Addressed household mobility and bed mobility in ADL apartment and gait training with Rw for general strength and endurance. Mod I for transfers and gait with RW - made pt mod I in room.   Therapy Documentation Precautions:  Precautions Precautions: Back, Fall Precaution Comments: reviewed back precautions Required Braces or Orthoses: Other Brace/Splint Other Brace/Splint: Pt wears bil knee support braces for arthritis and uses daughter's low back support brace for comfort at times Restrictions Weight Bearing Restrictions: No  Pain:  reports back pain - preferred to wait until after therapy for pain medication.  See FIM for current functional status  Therapy/Group: Individual Therapy  Stefanie Henry [facility] 06/15/2014, 9:23 AM

## 2014-06-15 NOTE — Progress Notes (Signed)
Occupational Therapy Discharge Summary  Patient Details  Name: Aleira Deiter MRN: [id number] Date of Birth: May 24, 1930  Patient has met 9 of 10 long term goals due to improved activity tolerance, improved balance, postural control, ability to compensate for deficits, improved attention, improved awareness and improved coordination.  Patient to discharge at overall Modified Independent level.  Patient's care partner is independent to provide the necessary supervision prn assistance at discharge.    Reasons goals not met: patient did not meet BUE HEP goal, no program given to patient at this time.   Recommendation: No additional occupational therapy recommended at this time.   Equipment: No equipment provided, patient can safely use standard toilet seats. Patient currently has tub transfer bench for use at home.   Reasons for discharge: treatment goals met and discharge from hospital  Patient/family agrees with progress made and goals achieved: Yes  Precautions/Restrictions  Precautions Precautions: Back;Fall Precaution Comments: reviewed back precautions Required Braces or Orthoses: Other Brace/Splint Other Brace/Splint: Pt wears bil knee support braces for arthritis and uses daughter's low back support brace for comfort at times Restrictions Weight Bearing Restrictions: No  Vital Signs Therapy Vitals Temp: 98.6 F (37 C) Temp Source: Oral Pulse Rate: 82 Resp: 18 BP: 126/69 mmHg Oxygen Therapy SpO2: 97 % O2 Device: Not Delivered  ADL - See FIM  Vision/Perception  Vision- History Baseline Vision/History: Wears glasses Wears Glasses: At all times Patient Visual Report: No change from baseline Vision- Assessment Vision Assessment?: No apparent visual deficits   Cognition Overall Cognitive Status: Within Functional Limits for tasks assessed Arousal/Alertness: Awake/alert Orientation Level: Oriented X4 Memory: Appears intact Awareness: Appears intact Problem Solving:  Appears intact Safety/Judgment: Appears intact   Sensation Sensation Light Touch: Appears Intact Additional Comments: BUEs appear intact Coordination Gross Motor Movements are Fluid and Coordinated: Yes Fine Motor Movements are Fluid and Coordinated: Yes   Motor  Motor Motor: Within Functional Limits   Trunk/Postural Assessment  Cervical Assessment Cervical Assessment: Within Functional Limits Thoracic Assessment Thoracic Assessment: Within Functional Limits Lumbar Assessment Lumbar Assessment: Exceptions to [facility] (back precautions) Postural Control Postural Control: Within Functional Limits   Balance Balance Balance Assessed: Yes Static Sitting Balance Static Sitting - Level of Assistance: 6: Modified independent (Device/Increase time) Dynamic Sitting Balance Dynamic Sitting - Level of Assistance: 6: Modified independent (Device/Increase time) Static Standing Balance Static Standing - Level of Assistance: 6: Modified independent (Device/Increase time) (with RW) Dynamic Standing Balance Dynamic Standing - Level of Assistance: 6: Modified independent (Device/Increase time) (with RW)   Extremity/Trunk Assessment RUE Assessment RUE Assessment: Within Functional Limits LUE Assessment LUE Assessment: Within Functional Limits  See FIM for current functional status  Skylee Baird 06/15/2014, 12:34 PM

## 2014-06-15 NOTE — Progress Notes (Signed)
Physical Therapy Discharge Summary  Patient Details  Name: Stefanie Henry MRN: [id number] Date of Birth: [birth year]   Patient has met 7 of 7 long term goals due to improved activity tolerance, improved balance, increased strength, decreased pain and ability to compensate for deficits.  Patient to discharge at an ambulatory level Modified Independentusing RW. Pt using lumbar corset and knee braces for comfort.   Patient's daughter is independent to provide the necessary supervision assistance at discharge.  Reasons goals not met: n/a - all goals met at this time  Recommendation:  Patient will benefit from ongoing skilled PT services in home health setting to continue to advance safe functional mobility, address ongoing impairments in gait, balance, pain, endurance, functional mobility, back precautions, and minimize fall risk.  Equipment: youth RW  Reasons for discharge: treatment goals met and discharge from hospital  Patient/family agrees with progress made and goals achieved: Yes  PT Discharge Precautions/Restrictions Precautions Precautions: Back;Fall Precaution Comments: reviewed back precautions Required Braces or Orthoses: Other Brace/Splint Other Brace/Splint: Pt wears bil knee support braces for arthritis and uses daughter's low back support brace for comfort at times Restrictions Weight Bearing Restrictions: No    Cognition Overall Cognitive Status: Within Functional Limits for tasks assessed Safety/Judgment: Appears intact Sensation Sensation Light Touch: Appears Intact Coordination Gross Motor Movements are Fluid and Coordinated: Yes Motor  Motor Motor: Within Functional Limits   Trunk/Postural Assessment  Cervical Assessment Cervical Assessment: Within Functional Limits Thoracic Assessment Thoracic Assessment: Within Functional Limits Lumbar Assessment Lumbar Assessment: Exceptions to [facility] (back precautions) Postural Control Postural Control: Within  Functional Limits  Balance Balance Balance Assessed: Yes Static Sitting Balance Static Sitting - Level of Assistance: 6: Modified independent (Device/Increase time) Dynamic Sitting Balance Dynamic Sitting - Level of Assistance: 6: Modified independent (Device/Increase time) Static Standing Balance Static Standing - Level of Assistance: 6: Modified independent (Device/Increase time) (with RW) Dynamic Standing Balance Dynamic Standing - Level of Assistance: 6: Modified independent (Device/Increase time) (with RW) Extremity Assessment  RLE Assessment RLE Assessment: Exceptions to [facility] RLE AROM (degrees) RLE Overall AROM Comments: WFL, wearing knee brace RLE Strength RLE Overall Strength Comments: grossly WFL; decreased muscular endurance LLE Assessment LLE Assessment: Exceptions to [facility] LLE Strength LLE Overall Strength Comments: grossly WFL; decreased muscular endurance  See FIM for current functional status  Canary Brim [facility] 06/15/2014, 1:28 PM

## 2014-06-15 NOTE — Progress Notes (Signed)
Physical Therapy Session Note  Patient Details  Name: Stefanie Henry MRN: [id number] Date of Birth: 1930-01-19  Today's Date: 06/15/2014 PT Individual Time: 1300-1315 PT Individual Time Calculation (min): 15 min   Short Term Goals: Week 1:  PT Short Term Goal 1 (Week 1): STG=LTG due to LOS: S overall except Min A bed mobility  Skilled Therapeutic Interventions/Progress Updates:   Upon therapist entering the room, pt asleep and daughter preferred for her not to be woken up. Discussed plan of reviewing HEP and gait in regards to discussion from earlier session this AM. Pt's daughter request to review the HEP with her but to let the pt rest until her brother arrives to transport home for d/c. Therapist used handout and educated and demonstrated HEP to pt's daughter as well as reiterated importance of continued mobility at home. Pt's daughter strongly agrees and states she will continue to reinforce this with the patient and family that will be caring for her once she travels back to [place]. Denies any further concerns and ready for d/c once brother arrives. Missed 45 min of skilled PT.  Therapy Documentation Precautions:  Precautions Precautions: Back, Fall Precaution Comments: reviewed back precautions Required Braces or Orthoses: Other Brace/Splint Other Brace/Splint: Pt wears bil knee support braces for arthritis and uses daughter's low back support brace for comfort at times Restrictions Weight Bearing Restrictions: No General: PT Amount of Missed Time (min): 45 Minutes PT Missed Treatment Reason: Patient fatigue;Unavailable (Comment) Pain:  Pt asleep. Did not rate.  See FIM for current functional status  Therapy/Group: Individual Therapy  Stefanie Henry [facility] 06/15/2014, 1:24 PM

## 2014-06-15 NOTE — Progress Notes (Signed)
Social Work Discharge Note Discharge Note  The overall goal for the admission was met for:   Discharge location: Yes-HOME WITH SON, DAUGHTER IN-LAW AND DAUGHTER-24 HR SUPERVISION  Length of Stay: Yes-4 DAYS  Discharge activity level: Yes-SUPERVISION LEVEL  Home/community participation: Yes  Services provided included: MD, RD, PT, OT, RN, CM, Pharmacy and SW  Financial Services: Medicare and Medicaid  Follow-up services arranged: Home Health: [place] [facility]-PT,OT,RN, DME: [facility] and Patient/Family has no preference for HH/DME agencies  Comments (or additional information):DAUGHTER STAYS HERE AND HAS LEARNED HER CARE-ALL COMFORTABLE WITH CARE AND DISCHARGE.  Patient/Family verbalized understanding of follow-up arrangements: Yes  Individual responsible for coordination of the follow-up plan: ANIL-SON & AMITA-DAUGHTER  Confirmed correct DME delivered: [name] 06/15/2014    [name]

## 2014-06-15 NOTE — Progress Notes (Signed)
[facility]ILIT[facility]     PROGRESS NOTE    Subjective/Complaints: Has some questions about back precautions, pain better. Lumbar corset too big. Daughter would like to go home today  Objective: Vital Signs: Blood pressure 126/69, pulse 82, temperature 98.6 F (37 C), temperature source Oral, resp. rate 18, height 4\' 11"  (1.499 m), weight 60.918 kg (134 lb 4.8 oz), SpO2 97 %. No results found.  Recent Labs  06/12/14 0820  WBC 46.4*  HGB 11.0*  HCT 35.3*  PLT 92*    Recent Labs  06/12/14 0820  NA 138  K 3.9  CL 100  GLUCOSE 153*  BUN 7  CREATININE 0.71  CALCIUM 8.8   CBG (last 3)  No results for input(s): GLUCAP in the last 72 hours.  Wt Readings from Last 3 Encounters:  06/13/14 60.918 kg (134 lb 4.8 oz)  06/09/14 58.196 kg (128 lb 4.8 oz)  11/27/13 57.153 kg (126 lb)    Physical Exam:  Head: Normocephalic and atraumatic.  Right Ear: External ear normal.  Left Ear: External ear normal.  Mouth/Throat: Oropharynx is clear and moist.  Poor dentition  Eyes: Conjunctivae and EOM are normal. Pupils are equal, round, and reactive to light.  Neck: Normal range of motion. Neck supple. No JVD present. No tracheal deviation present. No thyromegaly present.  Cardiovascular: Normal rate and regular rhythm. Exam reveals no gallop and no friction rub.  No murmur heard. Respiratory: Effort normal and breath sounds normal. No respiratory distress. She has no wheezes. She has no rales.  GI: Soft. Bowel sounds are normal. She exhibits no distension. There is no tenderness. There is no rebound.  Musculoskeletal:  Low back less tender Lymphadenopathy:   She has no cervical adenopathy.  Neurological: She appears a little sedated. She displays normal reflexes. No cranial nerve deficit. She exhibits normal muscle tone.  Limited English speaking. Follows commands. UES: 4/5 proximal to distal. LE: HF 2, KE 4-, ADF/APF 4/5. No sensory deficits.   Skin: Skin is warm and dry.  Some tenderness to palpation lumbar spine  Psychiatric: She has a normal mood and affect. Her behavior is normal. Judgment and thought content normal  Assessment/Plan: 1. Functional deficits secondary to L1 compression fracture which require 3+ hours per day of interdisciplinary therapy in a comprehensive inpatient rehab setting. Physiatrist is providing close team supervision and 24 hour management of active medical problems listed below. Physiatrist and rehab team continue to assess barriers to discharge/monitor patient progress toward functional and medical goals.  Dc home today after therapy  FIM: FIM - Bathing Bathing: 0: Activity did not occur  FIM - Upper Body Dressing/Undressing Upper body dressing/undressing steps patient completed: Thread/unthread right sleeve of pullover shirt/dresss, Thread/unthread left sleeve of pullover shirt/dress, Put head through opening of pull over shirt/dress, Pull shirt over trunk Upper body dressing/undressing: 5: Set-up assist to: Obtain clothing/put away FIM - Lower Body Dressing/Undressing Lower body dressing/undressing steps patient completed: Pull underwear up/down, Thread/unthread right pants leg, Thread/unthread left pants leg, Pull pants up/down, Fasten/unfasten pants, Don/Doff right sock Lower body dressing/undressing: 5: Supervision: Safety issues/verbal cues  FIM - Toileting Toileting steps completed by patient: Performs perineal hygiene, Adjust clothing prior to toileting, Adjust clothing after toileting Toileting: 5: Supervision: Safety issues/verbal cues  FIM - Radio producer Devices: Elevated toilet seat, Grab bars, Insurance account manager Transfers: 5-To toilet/BSC: Supervision (verbal cues/safety issues), 5-From toilet/BSC: Supervision (verbal cues/safety issues)  FIM - Control and instrumentation engineer Devices: Environmental consultant,  Arm rests, Orthosis Bed/Chair Transfer: 5:  Supine > Sit: Supervision (verbal cues/safety issues), 5: Sit > Supine: Supervision (verbal cues/safety issues), 6: Bed > Chair or W/C: No assist, 7: Chair or W/C > Bed: No assist  FIM - Locomotion: Wheelchair Distance: 25 Locomotion: Wheelchair: 1: Total Assistance/staff pushes wheelchair (Pt<25%) FIM - Locomotion: Ambulation Locomotion: Ambulation Assistive Devices: Administrator, Orthosis Ambulation/Gait Assistance: 5: Supervision Locomotion: Ambulation: 2: Travels 50 - 149 ft with supervision/safety issues  Comprehension Comprehension Mode: Auditory Comprehension: 6-Follows complex conversation/direction: With extra time/assistive device  Expression Expression Mode: Verbal Expression: 6-Expresses complex ideas: With extra time/assistive device  Social Interaction Social Interaction: 7-Interacts appropriately with others - No medications needed.  Problem Solving Problem Solving: 6-Solves complex problems: With extra time  Memory Memory: 7-Complete Independence: No helper  Medical Problem List and Plan: 1. Functional deficits secondary to L1 compression fracture after fall 2. DVT Prophylaxis/Anticoagulation: Subcutaneous heparin. Monitor platelet counts and any signs of bleeding 3. Pain Management: Neurontin 100 mg daily, Oxycodone as needed.  -added robaxin for muscle spasms  -will try to use current brace they have---corset was too large 4. CLL. Follow-up oncology services  -baseline leukocytosis 5. Neuropsych: This patient is capable of making decisions on her own behalf. 6. Skin/Wound Care: Routine skin checks 7. Fluids/Electrolytes/Nutrition: Strict I and O with follow-up chemistries 8. Iron deficiency anemia. Follow-up labs 9. GERD. Protonix  LOS (Days) 4 A FACE TO FACE EVALUATION WAS PERFORMED  Armstrong Creasy T 06/15/2014 7:10 AM    C

## 2014-06-15 NOTE — Discharge Instructions (Signed)
Inpatient Rehab Discharge Instructions  Tully Burgo Discharge date and time: No discharge date for patient encounter.   Activities/Precautions/ Functional Status: Activity: activity as tolerated Diet: Vegetarian diet Wound Care: none needed Functional status:  ___ No restrictions     ___ Walk up steps independently ___ 24/7 supervision/assistance   ___ Walk up steps with assistance ___ Intermittent supervision/assistance  ___ Bathe/dress independently ___ Walk with walker     ___ Bathe/dress with assistance ___ Walk Independently    ___ Shower independently _x__ Walk with assistance    ___ Shower with assistance ___ No alcohol     ___ Return to work/school ________  Special Instructions:    COMMUNITY REFERRALS UPON DISCHARGE:    Home Health:   PT, OT, RN    [place] FIEPP:[phone] Date of last service:06/15/2014   Medical Equipment/Items Ordered:[facility] [place]   [phone]   My questions have been answered and I understand these instructions. I will adhere to these goals and the provided educational materials after my discharge from the hospital.  Patient/Caregiver Signature _______________________________ Date __________  Clinician Signature _______________________________________ Date __________  Please bring this form and your medication list with you to all your follow-up doctor's appointments.

## 2014-06-15 NOTE — Progress Notes (Signed)
Social Work Patient ID: Stefanie Henry, female   DOB: 1930/06/17, 80 y.o.   MRN: [id number] Pt and daughter feel she will be ready to go home today.  MD fine with and team feels ready after therapies today. Home health and equipment set up.

## 2014-06-15 NOTE — Discharge Summary (Signed)
Stefanie Henry, WALLER NO.:  [id number]  MEDICAL RECORD NO.:  [id number]  LOCATION:  [id number]                        FACILITY:  [facility]  PHYSICIAN:  Meredith Staggers, M.D.DATE OF BIRTH:  [birth year]  DATE OF ADMISSION:  06/11/2014 DATE OF DISCHARGE:  07/16/2013                              DISCHARGE SUMMARY   DISCHARGE DIAGNOSES: 1. Functional deficit secondary to lumbar L1 compression fracture     after a fall. 2. Subcutaneous heparin for deep venous thrombosis prophylaxis. 3. Pain management. 4. Chronic lymphocytic leukemia followed up by Oncology Services. 5. Iron-deficiency anemia. 6. Gastroesophageal reflux disease.  HISTORY OF PRESENT ILLNESS:  This is an 80 year old right-handed female with history of CLL followup by Oncology Services as well as anemia and chronic abdominal pain who presented on June 08, 2014, after a fall while visiting her daughter in [place] on May 29, 2014.  She returned to home with increasing back pain.  Lumbar x-rays and MRI showed L1 compression fracture.  Hospital course, conservative care with pain management, subcutaneous heparin for DVT prophylaxis.  Physical and occupational therapy ongoing.  The patient was admitted for comprehensive rehab program.  PAST MEDICAL HISTORY:  See discharge diagnoses.  SOCIAL HISTORY:  Lives with family.  FUNCTIONAL HISTORY:  Prior to admission, independent with a cane. Functional status upon admission to Rehab Service was minimal assist, ambulate 30 feet with a quad cane; moderate assist for side laying to sitting position; moderate assist, supine to sit; minimal guard, sit-to- stand, min-to-mod assist, activities of daily living.  PHYSICAL EXAMINATION:  VITAL SIGNS:  Blood pressure 124/62, pulse 83, temperature 98, respirations 16. GENERAL:  This was an alert female, limited [place] speaking, well developed. LUNGS:  Clear to auscultation. CARDIAC:  Regular rate and  rhythm. ABDOMEN:  Soft, nontender.  Good bowel sounds. MUSCULOSKELETAL:  Low back pain, tender to palpation.  She had some difficulty with bed mobility and use of hip girdle muscles.  REHABILITATION HOSPITAL COURSE:  Patient was admitted to Inpatient Rehab Services with therapies initiated on a 3-hour daily basis consisting of physical therapy, occupational therapy, and rehabilitation nursing.  The following issues were addressed during the patient's rehabilitation stay.  Pertaining to Stefanie Henry functional deficits secondary to lumbar L1 compression fracture remained stable, conservative care. Pain management with the use of Neurontin, oxycodone, as well as Robaxin for spasms.  She was ordered a lumbar corset for comfort.  She did have a history of CLL followed by Oncology Service, so she was at her baseline for leukocytosis.  Iron-deficiency anemia.  Hemoglobin and hematocrit remained stable.  No bleeding episodes.  The patient received weekly collaborative interdisciplinary team conferences to discuss estimated length of stay, family teaching, and any barriers to discharge.  She was ambulating a 100 feet supervision with some rest breaks.  Simulated car transfers with supervision using a rolling walker.  Activities of daily living.  The patient engaged in functional ambulation around the room using a rolling walker for bathing dressing and grooming.  She performed toilet transfers with supervision using a rolling walker and grab bars.  She practiced shower stall transfers with her daughter and family.  Functional gains noted.  Patient  encouraged progress.  She was discharged to home.  DISCHARGE MEDICATIONS:  Included: 1. RisaQuad 1 capsule p.o. daily. 2. Calcitonin nasal spray 1 spray daily. 3. Pepcid 20 mg p.o. daily. 4. Neurontin 100 mg p.o. daily. 5. Robaxin 250 mg p.o. every 6 hours as needed for muscle spasms. 6. Oxycodone immediate release 5 mg every 4 hours as  needed severe     pain. 7. Protonix 40 mg p.o. daily. 8. Prednisone 10 mg p.o. daily. 9. Senokot daily.  DIET:  Her diet was a vegetarian diet.  SPECIAL INSTRUCTIONS:  The patient would follow up with Dr. Alger Simons, the Outpatient Rehab Service office as needed; Dr. Burney Gauze, July 04, 2014.  Special instructions, back corset for comfort.     Lauraine Rinne, P.A.   ______________________________ Meredith Staggers, M.D.    DA/MEDQ  D:  06/14/2014  T:  06/15/2014  Job:  [id number]  cc:   [name], M.D.

## 2014-06-17 DIAGNOSIS — W19XXXD Unspecified fall, subsequent encounter: Secondary | ICD-10-CM | POA: Diagnosis not present

## 2014-06-17 DIAGNOSIS — C9110 Chronic lymphocytic leukemia of B-cell type not having achieved remission: Secondary | ICD-10-CM | POA: Diagnosis not present

## 2014-06-17 DIAGNOSIS — K219 Gastro-esophageal reflux disease without esophagitis: Secondary | ICD-10-CM | POA: Diagnosis not present

## 2014-06-17 DIAGNOSIS — D509 Iron deficiency anemia, unspecified: Secondary | ICD-10-CM | POA: Diagnosis not present

## 2014-06-17 DIAGNOSIS — S32010S Wedge compression fracture of first lumbar vertebra, sequela: Secondary | ICD-10-CM | POA: Diagnosis not present

## 2014-06-18 DIAGNOSIS — C9110 Chronic lymphocytic leukemia of B-cell type not having achieved remission: Secondary | ICD-10-CM | POA: Diagnosis not present

## 2014-06-18 DIAGNOSIS — W19XXXD Unspecified fall, subsequent encounter: Secondary | ICD-10-CM | POA: Diagnosis not present

## 2014-06-18 DIAGNOSIS — D509 Iron deficiency anemia, unspecified: Secondary | ICD-10-CM | POA: Diagnosis not present

## 2014-06-18 DIAGNOSIS — S32010S Wedge compression fracture of first lumbar vertebra, sequela: Secondary | ICD-10-CM | POA: Diagnosis not present

## 2014-06-18 DIAGNOSIS — K219 Gastro-esophageal reflux disease without esophagitis: Secondary | ICD-10-CM | POA: Diagnosis not present

## 2014-06-19 DIAGNOSIS — K219 Gastro-esophageal reflux disease without esophagitis: Secondary | ICD-10-CM | POA: Diagnosis not present

## 2014-06-19 DIAGNOSIS — C9110 Chronic lymphocytic leukemia of B-cell type not having achieved remission: Secondary | ICD-10-CM | POA: Diagnosis not present

## 2014-06-19 DIAGNOSIS — D509 Iron deficiency anemia, unspecified: Secondary | ICD-10-CM | POA: Diagnosis not present

## 2014-06-19 DIAGNOSIS — W19XXXD Unspecified fall, subsequent encounter: Secondary | ICD-10-CM | POA: Diagnosis not present

## 2014-06-19 DIAGNOSIS — S32010S Wedge compression fracture of first lumbar vertebra, sequela: Secondary | ICD-10-CM | POA: Diagnosis not present

## 2014-06-20 DIAGNOSIS — S32010S Wedge compression fracture of first lumbar vertebra, sequela: Secondary | ICD-10-CM | POA: Diagnosis not present

## 2014-06-20 DIAGNOSIS — W19XXXD Unspecified fall, subsequent encounter: Secondary | ICD-10-CM | POA: Diagnosis not present

## 2014-06-20 DIAGNOSIS — C9110 Chronic lymphocytic leukemia of B-cell type not having achieved remission: Secondary | ICD-10-CM | POA: Diagnosis not present

## 2014-06-20 DIAGNOSIS — D509 Iron deficiency anemia, unspecified: Secondary | ICD-10-CM | POA: Diagnosis not present

## 2014-06-20 DIAGNOSIS — K219 Gastro-esophageal reflux disease without esophagitis: Secondary | ICD-10-CM | POA: Diagnosis not present

## 2014-06-21 DIAGNOSIS — C9110 Chronic lymphocytic leukemia of B-cell type not having achieved remission: Secondary | ICD-10-CM | POA: Diagnosis not present

## 2014-06-21 DIAGNOSIS — K219 Gastro-esophageal reflux disease without esophagitis: Secondary | ICD-10-CM | POA: Diagnosis not present

## 2014-06-21 DIAGNOSIS — S32010S Wedge compression fracture of first lumbar vertebra, sequela: Secondary | ICD-10-CM | POA: Diagnosis not present

## 2014-06-21 DIAGNOSIS — W19XXXD Unspecified fall, subsequent encounter: Secondary | ICD-10-CM | POA: Diagnosis not present

## 2014-06-21 DIAGNOSIS — D509 Iron deficiency anemia, unspecified: Secondary | ICD-10-CM | POA: Diagnosis not present

## 2014-06-24 ENCOUNTER — Other Ambulatory Visit: Payer: Self-pay | Admitting: Hematology & Oncology

## 2014-06-25 DIAGNOSIS — C9110 Chronic lymphocytic leukemia of B-cell type not having achieved remission: Secondary | ICD-10-CM | POA: Diagnosis not present

## 2014-06-25 DIAGNOSIS — S32010S Wedge compression fracture of first lumbar vertebra, sequela: Secondary | ICD-10-CM | POA: Diagnosis not present

## 2014-06-25 DIAGNOSIS — K219 Gastro-esophageal reflux disease without esophagitis: Secondary | ICD-10-CM | POA: Diagnosis not present

## 2014-06-25 DIAGNOSIS — W19XXXD Unspecified fall, subsequent encounter: Secondary | ICD-10-CM | POA: Diagnosis not present

## 2014-06-25 DIAGNOSIS — D509 Iron deficiency anemia, unspecified: Secondary | ICD-10-CM | POA: Diagnosis not present

## 2014-06-26 DIAGNOSIS — D509 Iron deficiency anemia, unspecified: Secondary | ICD-10-CM | POA: Diagnosis not present

## 2014-06-26 DIAGNOSIS — S32010S Wedge compression fracture of first lumbar vertebra, sequela: Secondary | ICD-10-CM | POA: Diagnosis not present

## 2014-06-26 DIAGNOSIS — K219 Gastro-esophageal reflux disease without esophagitis: Secondary | ICD-10-CM | POA: Diagnosis not present

## 2014-06-26 DIAGNOSIS — C9110 Chronic lymphocytic leukemia of B-cell type not having achieved remission: Secondary | ICD-10-CM | POA: Diagnosis not present

## 2014-06-26 DIAGNOSIS — W19XXXD Unspecified fall, subsequent encounter: Secondary | ICD-10-CM | POA: Diagnosis not present

## 2014-06-27 ENCOUNTER — Telehealth: Payer: Self-pay | Admitting: *Deleted

## 2014-06-27 DIAGNOSIS — K219 Gastro-esophageal reflux disease without esophagitis: Secondary | ICD-10-CM | POA: Diagnosis not present

## 2014-06-27 DIAGNOSIS — W19XXXD Unspecified fall, subsequent encounter: Secondary | ICD-10-CM | POA: Diagnosis not present

## 2014-06-27 DIAGNOSIS — E559 Vitamin D deficiency, unspecified: Secondary | ICD-10-CM | POA: Diagnosis not present

## 2014-06-27 DIAGNOSIS — S32010S Wedge compression fracture of first lumbar vertebra, sequela: Secondary | ICD-10-CM | POA: Diagnosis not present

## 2014-06-27 DIAGNOSIS — C9110 Chronic lymphocytic leukemia of B-cell type not having achieved remission: Secondary | ICD-10-CM | POA: Diagnosis not present

## 2014-06-27 DIAGNOSIS — D509 Iron deficiency anemia, unspecified: Secondary | ICD-10-CM | POA: Diagnosis not present

## 2014-06-27 DIAGNOSIS — S32019A Unspecified fracture of first lumbar vertebra, initial encounter for closed fracture: Secondary | ICD-10-CM | POA: Diagnosis not present

## 2014-06-27 DIAGNOSIS — R0602 Shortness of breath: Secondary | ICD-10-CM | POA: Diagnosis not present

## 2014-06-27 DIAGNOSIS — R530 Neoplastic (malignant) related fatigue: Secondary | ICD-10-CM

## 2014-06-27 NOTE — Telephone Encounter (Signed)
May order cbc and cmet.  What are vital signs?  Really should speaking with oncologist about symptoms also. thanks

## 2014-06-27 NOTE — Telephone Encounter (Signed)
Stefanie Henry from [facility] is calling.  Patient out of the hospital and is still very weak.  Would like to have blood work ordered (again) - patient feels like "last time" due to Chemo.  Can we please order CBC and Met B - please advise

## 2014-06-28 DIAGNOSIS — C9110 Chronic lymphocytic leukemia of B-cell type not having achieved remission: Secondary | ICD-10-CM | POA: Diagnosis not present

## 2014-06-28 DIAGNOSIS — W19XXXD Unspecified fall, subsequent encounter: Secondary | ICD-10-CM | POA: Diagnosis not present

## 2014-06-28 DIAGNOSIS — K219 Gastro-esophageal reflux disease without esophagitis: Secondary | ICD-10-CM | POA: Diagnosis not present

## 2014-06-28 DIAGNOSIS — D509 Iron deficiency anemia, unspecified: Secondary | ICD-10-CM | POA: Diagnosis not present

## 2014-06-28 DIAGNOSIS — S32010S Wedge compression fracture of first lumbar vertebra, sequela: Secondary | ICD-10-CM | POA: Diagnosis not present

## 2014-07-02 ENCOUNTER — Telehealth: Payer: Self-pay | Admitting: Hematology & Oncology

## 2014-07-02 DIAGNOSIS — W19XXXD Unspecified fall, subsequent encounter: Secondary | ICD-10-CM | POA: Diagnosis not present

## 2014-07-02 DIAGNOSIS — S32010S Wedge compression fracture of first lumbar vertebra, sequela: Secondary | ICD-10-CM | POA: Diagnosis not present

## 2014-07-02 DIAGNOSIS — C9110 Chronic lymphocytic leukemia of B-cell type not having achieved remission: Secondary | ICD-10-CM | POA: Diagnosis not present

## 2014-07-02 DIAGNOSIS — K219 Gastro-esophageal reflux disease without esophagitis: Secondary | ICD-10-CM | POA: Diagnosis not present

## 2014-07-02 DIAGNOSIS — D509 Iron deficiency anemia, unspecified: Secondary | ICD-10-CM | POA: Diagnosis not present

## 2014-07-02 NOTE — Telephone Encounter (Signed)
Patient's daughter n law called and cx 07/04/13 apt due to patient falling.  She did not want to resch at that time

## 2014-07-04 ENCOUNTER — Other Ambulatory Visit: Payer: Medicare Other | Admitting: Lab

## 2014-07-04 ENCOUNTER — Ambulatory Visit: Payer: Medicare Other | Admitting: Hematology & Oncology

## 2014-07-04 DIAGNOSIS — S32010S Wedge compression fracture of first lumbar vertebra, sequela: Secondary | ICD-10-CM | POA: Diagnosis not present

## 2014-07-04 DIAGNOSIS — K219 Gastro-esophageal reflux disease without esophagitis: Secondary | ICD-10-CM | POA: Diagnosis not present

## 2014-07-04 DIAGNOSIS — C9110 Chronic lymphocytic leukemia of B-cell type not having achieved remission: Secondary | ICD-10-CM | POA: Diagnosis not present

## 2014-07-04 DIAGNOSIS — W19XXXD Unspecified fall, subsequent encounter: Secondary | ICD-10-CM | POA: Diagnosis not present

## 2014-07-04 DIAGNOSIS — D509 Iron deficiency anemia, unspecified: Secondary | ICD-10-CM | POA: Diagnosis not present

## 2014-07-06 DIAGNOSIS — S32010S Wedge compression fracture of first lumbar vertebra, sequela: Secondary | ICD-10-CM | POA: Diagnosis not present

## 2014-07-06 DIAGNOSIS — K219 Gastro-esophageal reflux disease without esophagitis: Secondary | ICD-10-CM | POA: Diagnosis not present

## 2014-07-06 DIAGNOSIS — C9110 Chronic lymphocytic leukemia of B-cell type not having achieved remission: Secondary | ICD-10-CM | POA: Diagnosis not present

## 2014-07-06 DIAGNOSIS — D509 Iron deficiency anemia, unspecified: Secondary | ICD-10-CM | POA: Diagnosis not present

## 2014-07-06 DIAGNOSIS — W19XXXD Unspecified fall, subsequent encounter: Secondary | ICD-10-CM | POA: Diagnosis not present

## 2014-07-09 DIAGNOSIS — C9110 Chronic lymphocytic leukemia of B-cell type not having achieved remission: Secondary | ICD-10-CM | POA: Diagnosis not present

## 2014-07-09 DIAGNOSIS — S32010S Wedge compression fracture of first lumbar vertebra, sequela: Secondary | ICD-10-CM | POA: Diagnosis not present

## 2014-07-09 DIAGNOSIS — W19XXXD Unspecified fall, subsequent encounter: Secondary | ICD-10-CM | POA: Diagnosis not present

## 2014-07-09 DIAGNOSIS — K219 Gastro-esophageal reflux disease without esophagitis: Secondary | ICD-10-CM | POA: Diagnosis not present

## 2014-07-09 DIAGNOSIS — D509 Iron deficiency anemia, unspecified: Secondary | ICD-10-CM | POA: Diagnosis not present

## 2014-07-12 DIAGNOSIS — C9110 Chronic lymphocytic leukemia of B-cell type not having achieved remission: Secondary | ICD-10-CM | POA: Diagnosis not present

## 2014-07-12 DIAGNOSIS — W19XXXD Unspecified fall, subsequent encounter: Secondary | ICD-10-CM | POA: Diagnosis not present

## 2014-07-12 DIAGNOSIS — D509 Iron deficiency anemia, unspecified: Secondary | ICD-10-CM | POA: Diagnosis not present

## 2014-07-12 DIAGNOSIS — S32010S Wedge compression fracture of first lumbar vertebra, sequela: Secondary | ICD-10-CM | POA: Diagnosis not present

## 2014-07-12 DIAGNOSIS — K219 Gastro-esophageal reflux disease without esophagitis: Secondary | ICD-10-CM | POA: Diagnosis not present

## 2014-07-13 DIAGNOSIS — R1084 Generalized abdominal pain: Secondary | ICD-10-CM | POA: Diagnosis not present

## 2014-07-13 DIAGNOSIS — D539 Nutritional anemia, unspecified: Secondary | ICD-10-CM | POA: Diagnosis not present

## 2014-07-13 DIAGNOSIS — R0602 Shortness of breath: Secondary | ICD-10-CM | POA: Diagnosis not present

## 2014-07-13 DIAGNOSIS — D649 Anemia, unspecified: Secondary | ICD-10-CM | POA: Diagnosis not present

## 2014-07-13 DIAGNOSIS — J80 Acute respiratory distress syndrome: Secondary | ICD-10-CM | POA: Diagnosis not present

## 2014-07-16 ENCOUNTER — Telehealth: Payer: Self-pay | Admitting: Hematology & Oncology

## 2014-07-16 ENCOUNTER — Other Ambulatory Visit (HOSPITAL_BASED_OUTPATIENT_CLINIC_OR_DEPARTMENT_OTHER): Payer: Medicare Other

## 2014-07-16 ENCOUNTER — Other Ambulatory Visit: Payer: Self-pay | Admitting: *Deleted

## 2014-07-16 ENCOUNTER — Other Ambulatory Visit (HOSPITAL_BASED_OUTPATIENT_CLINIC_OR_DEPARTMENT_OTHER): Payer: Self-pay | Admitting: Internal Medicine

## 2014-07-16 DIAGNOSIS — R109 Unspecified abdominal pain: Secondary | ICD-10-CM

## 2014-07-16 NOTE — Telephone Encounter (Signed)
Patient's son called and spoke with a Nurse.  Per Roselyn Reef to sch lab/md apt for patient.  Patient is sch for 07/17/14

## 2014-07-17 ENCOUNTER — Ambulatory Visit (HOSPITAL_BASED_OUTPATIENT_CLINIC_OR_DEPARTMENT_OTHER): Payer: Medicare Other

## 2014-07-17 ENCOUNTER — Other Ambulatory Visit: Payer: Medicare Other | Admitting: Lab

## 2014-07-17 ENCOUNTER — Ambulatory Visit (HOSPITAL_BASED_OUTPATIENT_CLINIC_OR_DEPARTMENT_OTHER): Payer: Medicare Other | Admitting: Family

## 2014-07-17 ENCOUNTER — Encounter: Payer: Self-pay | Admitting: Family

## 2014-07-17 ENCOUNTER — Ambulatory Visit (HOSPITAL_BASED_OUTPATIENT_CLINIC_OR_DEPARTMENT_OTHER)
Admission: RE | Admit: 2014-07-17 | Discharge: 2014-07-17 | Disposition: A | Discharge status: Home / Self Care | Payer: Medicare Other | Source: Ambulatory Visit | Attending: Internal Medicine | Admitting: Internal Medicine

## 2014-07-17 VITALS — BP 115/55 | HR 86 | Resp 20

## 2014-07-17 DIAGNOSIS — C9110 Chronic lymphocytic leukemia of B-cell type not having achieved remission: Secondary | ICD-10-CM | POA: Diagnosis not present

## 2014-07-17 DIAGNOSIS — R109 Unspecified abdominal pain: Secondary | ICD-10-CM

## 2014-07-17 DIAGNOSIS — I700 Atherosclerosis of aorta: Secondary | ICD-10-CM | POA: Insufficient documentation

## 2014-07-17 DIAGNOSIS — R140 Abdominal distension (gaseous): Secondary | ICD-10-CM | POA: Insufficient documentation

## 2014-07-17 DIAGNOSIS — R1012 Left upper quadrant pain: Secondary | ICD-10-CM | POA: Insufficient documentation

## 2014-07-17 DIAGNOSIS — K8020 Calculus of gallbladder without cholecystitis without obstruction: Secondary | ICD-10-CM | POA: Diagnosis not present

## 2014-07-17 DIAGNOSIS — R161 Splenomegaly, not elsewhere classified: Secondary | ICD-10-CM | POA: Insufficient documentation

## 2014-07-17 DIAGNOSIS — D509 Iron deficiency anemia, unspecified: Secondary | ICD-10-CM | POA: Diagnosis not present

## 2014-07-17 MED ORDER — FERUMOXYTOL INJECTION 510 MG/17 ML
510.0000 mg | Freq: Once | INTRAVENOUS | Status: AC
  Administered 2014-07-17: 510 mg via INTRAVENOUS
  Filled 2014-07-17: qty 17

## 2014-07-17 MED ORDER — DIPHENHYDRAMINE HCL 25 MG PO CAPS
ORAL_CAPSULE | ORAL | Status: AC
  Filled 2014-07-17: qty 1

## 2014-07-17 MED ORDER — DIPHENHYDRAMINE HCL 25 MG PO CAPS
25.0000 mg | ORAL_CAPSULE | Freq: Once | ORAL | Status: AC
  Administered 2014-07-17: 25 mg via ORAL

## 2014-07-17 NOTE — Patient Instructions (Signed)

## 2014-07-17 NOTE — Progress Notes (Signed)
[facility]  Telephone:([phone]) [phone] [phone]) [phone]  ID: [name] OB: 08-11-1929 MR#: [id number]#:[id number] Patient Care Team: [name], MD as PCP - General (Internal Medicine) Leeroy Cha as Consulting Physician (Internal Medicine)  DIAGNOSIS: Chronic lymphocytic leukemia Iron deficiency anemia  INTERVAL HISTORY: Ms. Prioleau is here today with her son for follow-up and iron. She was followed by Dr. [name] previously for CLL in 2014. She was treated briefly with Treanda before deciding she did not want any more treatments. She is only interested in symptom management at this point and would like an iron infusion. She is dizzy, fatigued and SOB with exertion. Her iron studies on Friday showed a iron level of 6 and ferritin of 496.  She denies fever, chills, n/v, cough, rash, ice cravings, mouth sores, headache, chest pain, palpitations, constipation, diarrhea, blood in urine or stool.  She did fall in December that resulted in a lumbar fracture. She is still recuperating from this.  No swelling, tenderness, numbness or tingling in her legs.  Her appetite is good and she is staying hydrated. Her weight is stable.    She does have some abdominal pain. She had an abdominal ultrasound this morning which showed gall stones and splenomegaly. She is followed by Dr. Fuller Plan with GI.   CURRENT TREATMENT: Iron infusions as indicated  REVIEW OF SYSTEMS: All other 10 point review of systems is negative.   PAST MEDICAL HISTORY: Past Medical History  Diagnosis Date  . Asthma   . Arthritis   . Shingles   . Anemia, iron deficiency 06/06/2012  . Chronic lymphatic leukemia   . Gallstones     PAST SURGICAL HISTORY: History reviewed. No pertinent past surgical history.  FAMILY HISTORY Family History  Problem Relation Age of Onset  . Diabetes Father     GYNECOLOGIC HISTORY:  No LMP recorded. Patient is postmenopausal.   SOCIAL HISTORY: History    Social History  . Marital Status: Married    Spouse Name: N/A    Number of Children: 2  . Years of Education: N/A   Occupational History  . Retired    Social History Main Topics  . Smoking status: Never Smoker   . Smokeless tobacco: Never Used  . Alcohol Use: No  . Drug Use: No  . Sexual Activity: Not on file   Other Topics Concern  . Not on file   Social History Narrative    ADVANCED DIRECTIVES:  <no information>  HEALTH MAINTENANCE: History  Substance Use Topics  . Smoking status: Never Smoker   . Smokeless tobacco: Never Used  . Alcohol Use: No   Colonoscopy: PAP: Bone density: Lipid panel:  Allergies  Allergen Reactions  . Contrast Media [Iodinated Diagnostic Agents] Other (See Comments)    Stomach discomfort  . Fruit & Vegetable Daily [Nutritional Supplements] Other (See Comments)    Citrus fruit causes stomach discomfort  . Ibuprofen Other (See Comments)    Stomach discomfort    Current Outpatient Prescriptions  Medication Sig Dispense Refill  . ADVAIR DISKUS 250-50 MCG/DOSE AEPB Inhale 1 puff into the lungs as needed (for shortness of breath).     Marland Kitchen albuterol (PROVENTIL HFA;VENTOLIN HFA) 108 (90 BASE) MCG/ACT inhaler Inhale 1-2 puffs into the lungs every 6 (six) hours as needed for wheezing or shortness of breath.    . calcitonin, salmon, (MIACALCIN/FORTICAL) 200 UNIT/ACT nasal spray Place 1 spray into alternate nostrils daily. 3.7 mL 12  . famotidine (PEPCID) 20 MG tablet Take 1 tablet (  20 mg total) by mouth daily. 30 tablet 1  . gabapentin (NEURONTIN) 100 MG capsule Take 1 capsule (100 mg total) by mouth daily. 30 capsule 1  . methocarbamol (ROBAXIN) 500 MG tablet Take 0.5 tablets (250 mg total) by mouth every 6 (six) hours as needed for muscle spasms. 60 tablet 0  . omeprazole (PRILOSEC) 20 MG capsule Take 1 capsule (20 mg total) by mouth 2 (two) times daily before a meal. (Patient taking differently: Take 20 mg by mouth daily. ) 60 capsule 11  .  predniSONE (DELTASONE) 10 MG tablet TAKE 1 TABLET BY MOUTH ONCE DAILY 30 tablet 0  . Probiotic Product (PROBIOTIC DAILY PO) Take 1 capsule by mouth daily.    . Simethicone (GAS-X PO) Take 2 capsules by mouth daily.     Marland Kitchen KLOR-CON M20 20 MEQ tablet   4  . oxyCODONE (OXY IR/ROXICODONE) 5 MG immediate release tablet Take 1 tablet (5 mg total) by mouth every 4 (four) hours as needed for severe pain. (Patient not taking: Reported on 07/17/2014) 60 tablet 0   No current facility-administered medications for this visit.    OBJECTIVE: Filed Vitals:   07/17/14 1059  BP: 115/57  Pulse: 95  Temp: 98.2 F (36.8 C)  Resp: 18   There were no vitals filed for this visit. ECOG FS:2 - Symptomatic, <50% confined to bed Ocular: Sclerae unicteric, pupils equal, round and reactive to light Ear-nose-throat: Oropharynx clear, dentition fair Lymphatic: No cervical or supraclavicular adenopathy Lungs no rales or rhonchi, good excursion bilaterally Heart regular rate and rhythm, no murmur appreciated Abd soft, nontender, positive bowel sounds MSK no focal spinal tenderness, no joint edema Neuro: non-focal, well-oriented, appropriate affect Breasts: Deferred  LAB RESULTS: CMP     Component Value Date/Time   NA 138 06/12/2014 0820   NA 129 01/16/2013 0851   K 3.9 06/12/2014 0820   K 2.7* 01/16/2013 0851   CL 100 06/12/2014 0820   CL 93* 01/16/2013 0851   CO2 26 06/12/2014 0820   CO2 29 01/16/2013 0851   GLUCOSE 153* 06/12/2014 0820   GLUCOSE 168* 01/16/2013 0851   BUN 7 06/12/2014 0820   BUN 11 01/16/2013 0851   CREATININE 0.71 06/12/2014 0820   CREATININE 0.7 01/16/2013 0851   CALCIUM 8.8 06/12/2014 0820   CALCIUM 8.4 01/16/2013 0851   PROT 6.0 06/12/2014 0820   PROT 5.3* 01/16/2013 0851   ALBUMIN 3.6 06/12/2014 0820   AST 31 06/12/2014 0820   AST 15 01/16/2013 0851   ALT 84* 06/12/2014 0820   ALT 13 01/16/2013 0851   ALKPHOS 196* 06/12/2014 0820   ALKPHOS 59 01/16/2013 0851   BILITOT  0.4 06/12/2014 0820   BILITOT 0.80 01/16/2013 0851   GFRNONAA 78* 06/12/2014 0820   GFRAA >90 06/12/2014 0820   INo results found for: SPEP, UPEP Lab Results  Component Value Date   WBC 46.4* 06/12/2014   NEUTROABS 4.2 06/12/2014   HGB 11.0* 06/12/2014   HCT 35.3* 06/12/2014   MCV 72.2* 06/12/2014   PLT 92* 06/12/2014   No results found for: LABCA2 No components found for: IPJAS505 No results for input(s): INR in the last 168 hours.  STUDIES:   ASSESSMENT/PLAN: Ms. Perdue is a very pleasant 81 yo female with history of CLL. She does not want treated for this, only symptom management. She is c/o dizziness, weakness, fatigue and SOB with any exertion.  Her iron level was 6 and ferritin 496. Hgb was 11 MCV 72.  We  will go ahead and give her Madilyn Hook 510 today and then again in 8 days.  She would prefer to follow-up with [place] as needed. She and her son both know to call here with any questions or concerns and to go to the ED in the event of an emergency. We can certainly see her any time she might need [place].   Eliezer Bottom, NP 07/17/2014 11:21 AM

## 2014-07-19 ENCOUNTER — Encounter: Payer: Self-pay | Admitting: Hematology & Oncology

## 2014-07-24 DIAGNOSIS — W19XXXD Unspecified fall, subsequent encounter: Secondary | ICD-10-CM | POA: Diagnosis not present

## 2014-07-24 DIAGNOSIS — D509 Iron deficiency anemia, unspecified: Secondary | ICD-10-CM | POA: Diagnosis not present

## 2014-07-24 DIAGNOSIS — K219 Gastro-esophageal reflux disease without esophagitis: Secondary | ICD-10-CM | POA: Diagnosis not present

## 2014-07-24 DIAGNOSIS — S32010S Wedge compression fracture of first lumbar vertebra, sequela: Secondary | ICD-10-CM | POA: Diagnosis not present

## 2014-07-24 DIAGNOSIS — C9110 Chronic lymphocytic leukemia of B-cell type not having achieved remission: Secondary | ICD-10-CM | POA: Diagnosis not present

## 2014-07-25 ENCOUNTER — Ambulatory Visit: Payer: Medicare Other

## 2014-07-25 ENCOUNTER — Ambulatory Visit (HOSPITAL_BASED_OUTPATIENT_CLINIC_OR_DEPARTMENT_OTHER): Payer: Medicare Other

## 2014-07-25 VITALS — BP 105/52 | HR 91 | Temp 98.2°F | Resp 20

## 2014-07-25 DIAGNOSIS — D509 Iron deficiency anemia, unspecified: Secondary | ICD-10-CM

## 2014-07-25 MED ORDER — DIPHENHYDRAMINE HCL 25 MG PO CAPS
ORAL_CAPSULE | ORAL | Status: AC
  Filled 2014-07-25: qty 1

## 2014-07-25 MED ORDER — DIPHENHYDRAMINE HCL 25 MG PO CAPS
25.0000 mg | ORAL_CAPSULE | Freq: Once | ORAL | Status: AC
  Administered 2014-07-25: 25 mg via ORAL

## 2014-07-25 MED ORDER — SODIUM CHLORIDE 0.9 % IV SOLN
510.0000 mg | Freq: Once | INTRAVENOUS | Status: AC
  Administered 2014-07-25: 510 mg via INTRAVENOUS
  Filled 2014-07-25: qty 17

## 2014-07-25 NOTE — Patient Instructions (Signed)

## 2014-07-26 ENCOUNTER — Other Ambulatory Visit: Payer: Self-pay | Admitting: Hematology & Oncology

## 2014-07-26 DIAGNOSIS — S32010S Wedge compression fracture of first lumbar vertebra, sequela: Secondary | ICD-10-CM | POA: Diagnosis not present

## 2014-07-26 DIAGNOSIS — W19XXXD Unspecified fall, subsequent encounter: Secondary | ICD-10-CM | POA: Diagnosis not present

## 2014-07-26 DIAGNOSIS — C9110 Chronic lymphocytic leukemia of B-cell type not having achieved remission: Secondary | ICD-10-CM | POA: Diagnosis not present

## 2014-07-26 DIAGNOSIS — D509 Iron deficiency anemia, unspecified: Secondary | ICD-10-CM | POA: Diagnosis not present

## 2014-07-26 DIAGNOSIS — K219 Gastro-esophageal reflux disease without esophagitis: Secondary | ICD-10-CM | POA: Diagnosis not present

## 2014-07-27 DIAGNOSIS — W19XXXD Unspecified fall, subsequent encounter: Secondary | ICD-10-CM | POA: Diagnosis not present

## 2014-07-27 DIAGNOSIS — K219 Gastro-esophageal reflux disease without esophagitis: Secondary | ICD-10-CM | POA: Diagnosis not present

## 2014-07-27 DIAGNOSIS — C9110 Chronic lymphocytic leukemia of B-cell type not having achieved remission: Secondary | ICD-10-CM | POA: Diagnosis not present

## 2014-07-27 DIAGNOSIS — S32010S Wedge compression fracture of first lumbar vertebra, sequela: Secondary | ICD-10-CM | POA: Diagnosis not present

## 2014-07-27 DIAGNOSIS — D509 Iron deficiency anemia, unspecified: Secondary | ICD-10-CM | POA: Diagnosis not present

## 2014-07-31 DIAGNOSIS — W19XXXD Unspecified fall, subsequent encounter: Secondary | ICD-10-CM | POA: Diagnosis not present

## 2014-07-31 DIAGNOSIS — C9110 Chronic lymphocytic leukemia of B-cell type not having achieved remission: Secondary | ICD-10-CM | POA: Diagnosis not present

## 2014-07-31 DIAGNOSIS — K219 Gastro-esophageal reflux disease without esophagitis: Secondary | ICD-10-CM | POA: Diagnosis not present

## 2014-07-31 DIAGNOSIS — S32010S Wedge compression fracture of first lumbar vertebra, sequela: Secondary | ICD-10-CM | POA: Diagnosis not present

## 2014-07-31 DIAGNOSIS — D509 Iron deficiency anemia, unspecified: Secondary | ICD-10-CM | POA: Diagnosis not present

## 2014-08-01 ENCOUNTER — Other Ambulatory Visit: Payer: Self-pay | Admitting: Family

## 2014-08-01 DIAGNOSIS — M549 Dorsalgia, unspecified: Secondary | ICD-10-CM | POA: Diagnosis not present

## 2014-08-01 DIAGNOSIS — M47814 Spondylosis without myelopathy or radiculopathy, thoracic region: Secondary | ICD-10-CM | POA: Diagnosis not present

## 2014-08-01 DIAGNOSIS — R1084 Generalized abdominal pain: Secondary | ICD-10-CM | POA: Diagnosis not present

## 2014-08-01 DIAGNOSIS — K8010 Calculus of gallbladder with chronic cholecystitis without obstruction: Secondary | ICD-10-CM | POA: Diagnosis not present

## 2014-08-02 DIAGNOSIS — K219 Gastro-esophageal reflux disease without esophagitis: Secondary | ICD-10-CM | POA: Diagnosis not present

## 2014-08-02 DIAGNOSIS — S32010S Wedge compression fracture of first lumbar vertebra, sequela: Secondary | ICD-10-CM | POA: Diagnosis not present

## 2014-08-02 DIAGNOSIS — W19XXXD Unspecified fall, subsequent encounter: Secondary | ICD-10-CM | POA: Diagnosis not present

## 2014-08-02 DIAGNOSIS — D509 Iron deficiency anemia, unspecified: Secondary | ICD-10-CM | POA: Diagnosis not present

## 2014-08-02 DIAGNOSIS — C9110 Chronic lymphocytic leukemia of B-cell type not having achieved remission: Secondary | ICD-10-CM | POA: Diagnosis not present

## 2014-08-20 ENCOUNTER — Telehealth: Payer: Self-pay | Admitting: Hematology & Oncology

## 2014-08-20 ENCOUNTER — Ambulatory Visit (HOSPITAL_BASED_OUTPATIENT_CLINIC_OR_DEPARTMENT_OTHER): Payer: Medicare Other

## 2014-08-20 ENCOUNTER — Encounter: Payer: Self-pay | Admitting: Family

## 2014-08-20 ENCOUNTER — Ambulatory Visit (HOSPITAL_BASED_OUTPATIENT_CLINIC_OR_DEPARTMENT_OTHER): Payer: Medicare Other | Admitting: Family

## 2014-08-20 ENCOUNTER — Other Ambulatory Visit (HOSPITAL_BASED_OUTPATIENT_CLINIC_OR_DEPARTMENT_OTHER): Payer: Medicare Other | Admitting: Lab

## 2014-08-20 VITALS — BP 98/64 | HR 88 | Temp 98.5°F | Resp 16 | Ht <= 58 in | Wt 127.0 lb

## 2014-08-20 DIAGNOSIS — C9110 Chronic lymphocytic leukemia of B-cell type not having achieved remission: Secondary | ICD-10-CM

## 2014-08-20 DIAGNOSIS — E860 Dehydration: Secondary | ICD-10-CM | POA: Diagnosis not present

## 2014-08-20 DIAGNOSIS — D509 Iron deficiency anemia, unspecified: Secondary | ICD-10-CM | POA: Diagnosis not present

## 2014-08-20 LAB — MANUAL DIFFERENTIAL (CHCC SATELLITE)
ALC: 38.5 10*3/uL — AB (ref 0.6–2.2)
ANC (CHCC HP manual diff): 4.4 10*3/uL (ref 1.5–6.7)
EOS: 1 % (ref 0–7)
LYMPH: 88 % — ABNORMAL HIGH (ref 14–48)
MONO: 1 % (ref 0–13)
PLT EST ~~LOC~~: DECREASED
SEG: 10 % — ABNORMAL LOW (ref 40–75)

## 2014-08-20 LAB — COMPREHENSIVE METABOLIC PANEL
ALT: 9 U/L (ref 0–35)
AST: 11 U/L (ref 0–37)
Albumin: 3.7 g/dL (ref 3.5–5.2)
Alkaline Phosphatase: 63 U/L (ref 39–117)
BUN: 12 mg/dL (ref 6–23)
CO2: 25 mEq/L (ref 19–32)
Calcium: 8.7 mg/dL (ref 8.4–10.5)
Chloride: 101 mEq/L (ref 96–112)
Creatinine, Ser: 0.67 mg/dL (ref 0.50–1.10)
Glucose, Bld: 129 mg/dL — ABNORMAL HIGH (ref 70–99)
Potassium: 4 mEq/L (ref 3.5–5.3)
SODIUM: 137 meq/L (ref 135–145)
TOTAL PROTEIN: 5.2 g/dL — AB (ref 6.0–8.3)
Total Bilirubin: 0.3 mg/dL (ref 0.2–1.2)

## 2014-08-20 LAB — CBC WITH DIFFERENTIAL (CANCER CENTER ONLY)
HCT: 36.4 % (ref 34.8–46.6)
HGB: 11.5 g/dL — ABNORMAL LOW (ref 11.6–15.9)
MCH: 23 pg — ABNORMAL LOW (ref 26.0–34.0)
MCHC: 31.6 g/dL — AB (ref 32.0–36.0)
MCV: 73 fL — AB (ref 81–101)
Platelets: 104 10*3/uL — ABNORMAL LOW (ref 145–400)
RBC: 4.99 10*6/uL (ref 3.70–5.32)
RDW: 15.6 % (ref 11.1–15.7)
WBC: 43.8 10*3/uL — AB (ref 3.9–10.0)

## 2014-08-20 LAB — CHCC SATELLITE - SMEAR

## 2014-08-20 MED ORDER — SODIUM CHLORIDE 0.9 % IV SOLN
Freq: Once | INTRAVENOUS | Status: AC
  Administered 2014-08-20: 15:00:00 via INTRAVENOUS

## 2014-08-20 NOTE — Telephone Encounter (Signed)
Son called pt is very weak and short of breath. Per Judson Roch get lab and she will see pt. Son aware.

## 2014-08-20 NOTE — Patient Instructions (Signed)
Dehydration, Adult Dehydration is when you lose more fluids from the body than you take in. Vital organs like the kidneys, brain, and heart cannot function without a proper amount of fluids and salt. Any loss of fluids from the body can cause dehydration.  CAUSES   Vomiting.  Diarrhea.  Excessive sweating.  Excessive urine output.  Fever. SYMPTOMS  Mild dehydration  Thirst.  Dry lips.  Slightly dry mouth. Moderate dehydration  Very dry mouth.  Sunken eyes.  Skin does not bounce back quickly when lightly pinched and released.  Dark urine and decreased urine production.  Decreased tear production.  Headache. Severe dehydration  Very dry mouth.  Extreme thirst.  Rapid, weak pulse (more than 100 beats per minute at rest).  Cold hands and feet.  Not able to sweat in spite of heat and temperature.  Rapid breathing.  Blue lips.  Confusion and lethargy.  Difficulty being awakened.  Minimal urine production.  No tears. DIAGNOSIS  Your caregiver will diagnose dehydration based on your symptoms and your exam. Blood and urine tests will help confirm the diagnosis. The diagnostic evaluation should also identify the cause of dehydration. TREATMENT  Treatment of mild or moderate dehydration can often be done at home by increasing the amount of fluids that you drink. It is best to drink small amounts of fluid more often. Drinking too much at one time can make vomiting worse. Refer to the home care instructions below. Severe dehydration needs to be treated at the hospital where you will probably be given intravenous (IV) fluids that contain water and electrolytes. HOME CARE INSTRUCTIONS   Ask your caregiver about specific rehydration instructions.  Drink enough fluids to keep your urine clear or pale yellow.  Drink small amounts frequently if you have nausea and vomiting.  Eat as you normally do.  Avoid:  Foods or drinks high in sugar.  Carbonated  drinks.  Juice.  Extremely hot or cold fluids.  Drinks with caffeine.  Fatty, greasy foods.  Alcohol.  Tobacco.  Overeating.  Gelatin desserts.  Wash your hands well to avoid spreading bacteria and viruses.  Only take over-the-counter or prescription medicines for pain, discomfort, or fever as directed by your caregiver.  Ask your caregiver if you should continue all prescribed and over-the-counter medicines.  Keep all follow-up appointments with your caregiver. SEEK MEDICAL CARE IF:  You have abdominal pain and it increases or stays in one area (localizes).  You have a rash, stiff neck, or severe headache.  You are irritable, sleepy, or difficult to awaken.  You are weak, dizzy, or extremely thirsty. SEEK IMMEDIATE MEDICAL CARE IF:   You are unable to keep fluids down or you get worse despite treatment.  You have frequent episodes of vomiting or diarrhea.  You have blood or green matter (bile) in your vomit.  You have blood in your stool or your stool looks black and tarry.  You have not urinated in 6 to 8 hours, or you have only urinated a small amount of very dark urine.  You have a fever.  You faint. MAKE SURE YOU:   Understand these instructions.  Will watch your condition.  Will get help right away if you are not doing well or get worse. Document Released: 06/15/2005 Document Revised: 09/07/2011 Document Reviewed: 02/02/2011 ExitCare Patient Information 2015 [employer]. This information is not intended to replace advice given to you by your health care provider. Make sure you discuss any questions you have with your health care   provider.  

## 2014-08-20 NOTE — Progress Notes (Signed)
[facility]  [phone] [phone]  ID: Stefanie Henry OB: 1929-12-21 MR#: [id number] JYN#:[id number] Patient Care Team: [name], MD as PCP - General (Internal Medicine) Leeroy Cha as Consulting Physician (Internal Medicine)  DIAGNOSIS: Chronic lymphocytic leukemia Iron deficiency anemia  INTERVAL HISTORY: Stefanie Henry is here today with her son with c/o SOB, dizziness, weakness and poor appetite. She was followed by Dr. [name] previously for CLL in 2014. She was treated briefly with Treanda before deciding she did not want any more treatments. She is only interested in symptom management. We gave her 2 doses of Fereheme in January and she did well. She denies fever, chills, n/v, cough, rash, ice cravings, mouth sores, headache, chest pain, palpitations, constipation, diarrhea, blood in urine or stool.  No swelling, tenderness, numbness or tingling in her legs.  Her appetite is down and she has not been drinking much fluid.  She does have some abdominal pain. A recent abdominal ultrasound showed gall stones and splenomegaly. She is followed by Dr. Fuller Plan with GI.   CURRENT TREATMENT: Iron infusions as indicated  REVIEW OF SYSTEMS: All other 10 point review of systems is negative.   PAST MEDICAL HISTORY: Past Medical History  Diagnosis Date  . Asthma   . Arthritis   . Shingles   . Anemia, iron deficiency 06/06/2012  . Chronic lymphatic leukemia   . Gallstones     PAST SURGICAL HISTORY: No past surgical history on file.  FAMILY HISTORY Family History  Problem Relation Age of Onset  . Diabetes Father     GYNECOLOGIC HISTORY:  No LMP recorded. Patient is postmenopausal.   SOCIAL HISTORY: History   Social History  . Marital Status: Married    Spouse Name: N/A  . Number of Children: 2  . Years of Education: N/A   Occupational History  . Retired    Social History Main Topics  . Smoking status: Never Smoker   .  Smokeless tobacco: Never Used  . Alcohol Use: No  . Drug Use: No  . Sexual Activity: Not on file   Other Topics Concern  . Not on file   Social History Narrative    ADVANCED DIRECTIVES:  <no information>  HEALTH MAINTENANCE: History  Substance Use Topics  . Smoking status: Never Smoker   . Smokeless tobacco: Never Used  . Alcohol Use: No   Colonoscopy: PAP: Bone density: Lipid panel:  Allergies  Allergen Reactions  . Contrast Media [Iodinated Diagnostic Agents] Other (See Comments)    Stomach discomfort  . Fruit & Vegetable Daily [Nutritional Supplements] Other (See Comments)    Citrus fruit causes stomach discomfort  . Ibuprofen Other (See Comments)    Stomach discomfort    Current Outpatient Prescriptions  Medication Sig Dispense Refill  . ADVAIR DISKUS 250-50 MCG/DOSE AEPB Inhale 1 puff into the lungs as needed (for shortness of breath).     Marland Kitchen albuterol (PROVENTIL HFA;VENTOLIN HFA) 108 (90 BASE) MCG/ACT inhaler Inhale 1-2 puffs into the lungs every 6 (six) hours as needed for wheezing or shortness of breath.    . calcitonin, salmon, (MIACALCIN/FORTICAL) 200 UNIT/ACT nasal spray Place 1 spray into alternate nostrils daily. 3.7 mL 12  . famotidine (PEPCID) 20 MG tablet Take 1 tablet (20 mg total) by mouth daily. 30 tablet 1  . gabapentin (NEURONTIN) 100 MG capsule Take 1 capsule (100 mg total) by mouth daily. 30 capsule 1  . KLOR-CON M20 20 MEQ tablet   4  . methocarbamol (ROBAXIN) 500  MG tablet Take 0.5 tablets (250 mg total) by mouth every 6 (six) hours as needed for muscle spasms. 60 tablet 0  . omeprazole (PRILOSEC) 20 MG capsule Take 1 capsule (20 mg total) by mouth 2 (two) times daily before a meal. (Patient taking differently: Take 20 mg by mouth daily. ) 60 capsule 11  . oxyCODONE (OXY IR/ROXICODONE) 5 MG immediate release tablet Take 1 tablet (5 mg total) by mouth every 4 (four) hours as needed for severe pain. (Patient not taking: Reported on 07/17/2014) 60  tablet 0  . predniSONE (DELTASONE) 10 MG tablet TAKE 1 TABLET BY MOUTH ONCE DAILY 30 tablet 0  . Probiotic Product (PROBIOTIC DAILY PO) Take 1 capsule by mouth daily.    . Simethicone (GAS-X PO) Take 2 capsules by mouth daily.      No current facility-administered medications for this visit.    OBJECTIVE: There were no vitals filed for this visit. There were no vitals filed for this visit. ECOG FS:2 - Symptomatic, <50% confined to bed Ocular: Sclerae unicteric, pupils equal, round and reactive to light Ear-nose-throat: Oropharynx clear, dentition fair Lymphatic: No cervical or supraclavicular adenopathy Lungs no rales or rhonchi, good excursion bilaterally Heart regular rate and rhythm, no murmur appreciated Abd soft, nontender, positive bowel sounds MSK no focal spinal tenderness, no joint edema Neuro: non-focal, well-oriented, appropriate affect Breasts: Deferred  LAB RESULTS: CMP     Component Value Date/Time   NA 138 06/12/2014 0820   NA 129 01/16/2013 0851   K 3.9 06/12/2014 0820   K 2.7* 01/16/2013 0851   CL 100 06/12/2014 0820   CL 93* 01/16/2013 0851   CO2 26 06/12/2014 0820   CO2 29 01/16/2013 0851   GLUCOSE 153* 06/12/2014 0820   GLUCOSE 168* 01/16/2013 0851   BUN 7 06/12/2014 0820   BUN 11 01/16/2013 0851   CREATININE 0.71 06/12/2014 0820   CREATININE 0.7 01/16/2013 0851   CALCIUM 8.8 06/12/2014 0820   CALCIUM 8.4 01/16/2013 0851   PROT 6.0 06/12/2014 0820   PROT 5.3* 01/16/2013 0851   ALBUMIN 3.6 06/12/2014 0820   AST 31 06/12/2014 0820   AST 15 01/16/2013 0851   ALT 84* 06/12/2014 0820   ALT 13 01/16/2013 0851   ALKPHOS 196* 06/12/2014 0820   ALKPHOS 59 01/16/2013 0851   BILITOT 0.4 06/12/2014 0820   BILITOT 0.80 01/16/2013 0851   GFRNONAA 78* 06/12/2014 0820   GFRAA >90 06/12/2014 0820   INo results found for: SPEP, UPEP Lab Results  Component Value Date   WBC 46.4* 06/12/2014   NEUTROABS 4.2 06/12/2014   HGB 11.0* 06/12/2014   HCT 35.3*  06/12/2014   MCV 72.2* 06/12/2014   PLT 92* 06/12/2014   No results found for: LABCA2 No components found for: [id number] No results for input(s): INR in the last 168 hours.  STUDIES:   ASSESSMENT/PLAN: Stefanie Henry is a very pleasant 81 yo female with history of CLL. She does not want treated for this, only symptom management. She is c/o dizziness, weakness, no appetite and SOB.  Her Hgb is 11.5 MCV 73. We will see what the rest of her labs show.  Her skin a dry and she has some tenting. We will give her fluids today for her dehydration.   She would prefer to follow-up with [place] as needed. She and her son both know to call here with any questions or concerns and to go to the ED in the event of an emergency. We can certainly  see her any time she might need [place].   Eliezer Bottom, NP 08/20/2014 1:46 PM

## 2014-08-21 LAB — IRON AND TIBC CHCC
%SAT: 47 % (ref 21–57)
IRON: 118 ug/dL (ref 41–142)
TIBC: 253 ug/dL (ref 236–444)
UIBC: 135 ug/dL (ref 120–384)

## 2014-08-21 LAB — FERRITIN CHCC: Ferritin: 1076 ng/ml — ABNORMAL HIGH (ref 9–269)

## 2014-08-29 ENCOUNTER — Other Ambulatory Visit: Payer: Self-pay | Admitting: Hematology & Oncology

## 2014-09-13 ENCOUNTER — Institutional Professional Consult (permissible substitution): Payer: Medicare Other | Admitting: Internal Medicine

## 2014-09-17 ENCOUNTER — Institutional Professional Consult (permissible substitution): Payer: Medicare Other | Admitting: Internal Medicine

## 2014-10-01 ENCOUNTER — Other Ambulatory Visit: Payer: Self-pay | Admitting: *Deleted

## 2014-10-01 ENCOUNTER — Telehealth: Payer: Self-pay | Admitting: Hematology & Oncology

## 2014-10-01 DIAGNOSIS — C9110 Chronic lymphocytic leukemia of B-cell type not having achieved remission: Secondary | ICD-10-CM

## 2014-10-01 NOTE — Telephone Encounter (Signed)
Son called wanted lab for pt. MD to schedule per MD, pt aware of 4-5

## 2014-10-02 ENCOUNTER — Other Ambulatory Visit (HOSPITAL_BASED_OUTPATIENT_CLINIC_OR_DEPARTMENT_OTHER): Payer: Medicare Other

## 2014-10-02 DIAGNOSIS — C9110 Chronic lymphocytic leukemia of B-cell type not having achieved remission: Secondary | ICD-10-CM

## 2014-10-02 LAB — CBC WITH DIFFERENTIAL (CANCER CENTER ONLY)
HCT: 34.8 % (ref 34.8–46.6)
HGB: 11.1 g/dL — ABNORMAL LOW (ref 11.6–15.9)
MCH: 23.4 pg — ABNORMAL LOW (ref 26.0–34.0)
MCHC: 31.9 g/dL — ABNORMAL LOW (ref 32.0–36.0)
MCV: 73 fL — ABNORMAL LOW (ref 81–101)
Platelets: 101 10*3/uL — ABNORMAL LOW (ref 145–400)
RBC: 4.75 10*6/uL (ref 3.70–5.32)
RDW: 14.5 % (ref 11.1–15.7)
WBC: 43.1 10*3/uL — AB (ref 3.9–10.0)

## 2014-10-02 LAB — MANUAL DIFFERENTIAL (CHCC SATELLITE)
ALC: 40.1 10*3/uL — ABNORMAL HIGH (ref 0.6–2.2)
ANC (CHCC HP manual diff): 2.2 10*3/uL (ref 1.5–6.7)
LYMPH: 93 % — ABNORMAL HIGH (ref 14–48)
MONO: 2 % (ref 0–13)
PLATELET MORPHOLOGY: NORMAL
PLT EST ~~LOC~~: DECREASED
SEG: 5 % — ABNORMAL LOW (ref 40–75)

## 2014-10-03 ENCOUNTER — Other Ambulatory Visit: Payer: Self-pay | Admitting: Hematology & Oncology

## 2014-10-05 ENCOUNTER — Encounter: Payer: Self-pay | Admitting: Internal Medicine

## 2014-10-05 ENCOUNTER — Ambulatory Visit (INDEPENDENT_AMBULATORY_CARE_PROVIDER_SITE_OTHER)
Admission: RE | Admit: 2014-10-05 | Discharge: 2014-10-05 | Disposition: A | Discharge status: Home / Self Care | Payer: Medicare Other | Source: Ambulatory Visit | Attending: Internal Medicine | Admitting: Internal Medicine

## 2014-10-05 ENCOUNTER — Other Ambulatory Visit (INDEPENDENT_AMBULATORY_CARE_PROVIDER_SITE_OTHER): Payer: Medicare Other

## 2014-10-05 ENCOUNTER — Telehealth: Payer: Self-pay | Admitting: *Deleted

## 2014-10-05 ENCOUNTER — Ambulatory Visit (INDEPENDENT_AMBULATORY_CARE_PROVIDER_SITE_OTHER): Payer: Medicare Other | Admitting: Internal Medicine

## 2014-10-05 VITALS — BP 88/52 | HR 60 | Ht <= 58 in | Wt 128.0 lb

## 2014-10-05 DIAGNOSIS — R0600 Dyspnea, unspecified: Secondary | ICD-10-CM

## 2014-10-05 DIAGNOSIS — J45909 Unspecified asthma, uncomplicated: Secondary | ICD-10-CM | POA: Diagnosis not present

## 2014-10-05 HISTORY — DX: Dyspnea, unspecified: R06.00

## 2014-10-05 LAB — BASIC METABOLIC PANEL
BUN: 13 mg/dL (ref 6–23)
CHLORIDE: 100 meq/L (ref 96–112)
CO2: 28 mEq/L (ref 19–32)
Calcium: 8.6 mg/dL (ref 8.4–10.5)
Creatinine, Ser: 0.6 mg/dL (ref 0.40–1.20)
GFR: 101.42 mL/min (ref >60.00)
Glucose, Bld: 122 mg/dL — ABNORMAL HIGH (ref 70–99)
POTASSIUM: 3.9 meq/L (ref 3.5–5.1)
Sodium: 133 mEq/L — ABNORMAL LOW (ref 135–145)

## 2014-10-05 LAB — CBC WITH DIFFERENTIAL/PLATELET
Basophils Absolute: 0.1 10*3/uL (ref 0.0–0.1)
Basophils Relative: 0.2 % (ref 0.0–3.0)
Eosinophils Absolute: 0 10*3/uL (ref 0.0–0.7)
Eosinophils Relative: 0.1 % (ref 0.0–5.0)
HEMATOCRIT: 36.2 % (ref 36.0–46.0)
Hemoglobin: 11.6 g/dL — ABNORMAL LOW (ref 12.0–15.0)
Lymphocytes Relative: 84.5 % — ABNORMAL HIGH (ref 12.0–46.0)
Lymphs Abs: 30.8 10*3/uL — ABNORMAL HIGH (ref 0.7–4.0)
MCHC: 32 g/dL (ref 30.0–36.0)
MCV: 72.8 fl — ABNORMAL LOW (ref 78.0–100.0)
MONOS PCT: 3.9 % (ref 3.0–12.0)
Monocytes Absolute: 1.4 10*3/uL — ABNORMAL HIGH (ref 0.1–1.0)
Neutro Abs: 4.1 10*3/uL (ref 1.4–7.7)
Neutrophils Relative %: 11.3 % — ABNORMAL LOW (ref 43.0–77.0)
Platelets: 108 10*3/uL — ABNORMAL LOW (ref 150.0–400.0)
RBC: 4.98 Mil/uL (ref 3.87–5.11)
RDW: 14.6 % (ref 11.5–15.5)
WBC: 36.5 10*3/uL (ref 4.0–10.5)

## 2014-10-05 LAB — TSH: TSH: 0.65 u[IU]/mL (ref 0.35–4.50)

## 2014-10-05 LAB — BRAIN NATRIURETIC PEPTIDE: Pro B Natriuretic peptide (BNP): 81 pg/mL (ref 0.0–100.0)

## 2014-10-05 MED ORDER — PANTOPRAZOLE SODIUM 40 MG PO TBEC: 40.0000 mg | DELAYED_RELEASE_TABLET | Freq: Every day | ORAL | Status: DC

## 2014-10-05 NOTE — Progress Notes (Signed)
Subjective:    Patient ID: Stefanie Henry, female    DOB: 1929/08/12,   MRN: [id number]  HPI  81 yo [place] female speaks Hindi with asthma as child ? Exp to environmental smoke worse sob since  about 2000 required 3 different inhalers seen by [facility] pulmonary eval in [place] around 2010 rec advair and prn ventolin referred 10/05/14 to pulmonary by DR Shelbie Proctor for sob x late 2015   10/05/2014 1st [facility] office visit/ Sabas Frett   Chief Complaint  Patient presents with  . Pulmonary Consult    Referred by Dr. Shelbie Proctor. Pt c/o SOB for the past 4-5 months. She gets SOB walking from room to room and sometimes when she lies down. She states when she eats it feels like food gets stuck in her chest. She also c/o left side pain.   stopped advair one year prior to OV  Due to throat irritation and continued to use ventolin twice daily not really sure it helps  Breathing worse with voice use  Most nights has trouble lying down for more than 3 hours s pnd takes 3 long breaths and goes back to bed  On pred 10 mg daily per Ennever for cll but makes no difference with resp symptoms  Stops one half flight steps to rest Also limited by arthritis  Did have leg swelling now resolved   No obvious other patterns in day to day or daytime variabilty or assoc chronic cough or cp or chest tightness, subjective wheeze overt sinus or hb symptoms. No unusual exp hx or h/o childhood pna/ asthma or knowledge of premature birth.  Sleeping ok without nocturnal  or early am exacerbation  of respiratory  c/o's or need for noct saba. Also denies any obvious fluctuation of symptoms with weather or environmental changes or other aggravating or alleviating factors except as outlined above   Current Medications, Allergies, Complete Past Medical History, Past Surgical History, Family History, and Social History were reviewed in [employer] record.             Review of Systems  Constitutional:  Negative for fever, chills and unexpected weight change.  HENT: Negative for congestion, dental problem, ear pain, nosebleeds, postnasal drip, rhinorrhea, sinus pressure, sneezing, sore throat, trouble swallowing and voice change.   Eyes: Negative for visual disturbance.  Respiratory: Positive for shortness of breath. Negative for cough and choking.   Cardiovascular: Positive for leg swelling. Negative for chest pain.  Gastrointestinal: Positive for abdominal pain. Negative for vomiting and diarrhea.  Genitourinary: Negative for difficulty urinating.  Musculoskeletal: Positive for arthralgias.  Skin: Negative for rash.  Neurological: Negative for tremors, syncope and headaches.  Hematological: Does not bruise/bleed easily.       Objective:   Physical Exam  W/c bound [place] female nad/ walks very slow pace with 4 pronged walker   Wt Readings from Last 3 Encounters:  10/05/14 128 lb (58.06 kg)  08/20/14 127 lb (57.607 kg)  06/13/14 134 lb 4.8 oz (60.918 kg)    Vital signs reviewed  HEENT: nl dentition, turbinates, and orophanx. Nl external ear canals without cough reflex   NECK :  without JVD/Nodes/TM/ nl carotid upstrokes bilaterally   LUNGS: no acc muscle use, clear to A and P bilaterally without cough on insp or exp maneuvers   CV:  RRR  no s3 or murmur or increase in P2, no edema   ABD:  soft and nontender with nl excursion in the supine position. No bruits  or organomegaly, bowel sounds nl  MS:  warm without deformities, calf tenderness, cyanosis or clubbing  SKIN: warm and dry without lesions    NEURO:  alert, approp, no deficits    CXR PA and Lateral:   10/05/2014 :     I personally reviewed images and agree with radiology impression as follows:    No active cardiopulmonary disease.    Labs ordered/ reviewed   Lab 10/05/14 1142  NA 133*  K 3.9  CL 100  CO2 28  BUN 13  CREATININE 0.60  GLUCOSE 122*    Recent Labs Lab 10/02/14 1000 10/05/14 1142    HGB 11.1* 11.6*  HCT 34.8 36.2  WBC 43.1* 36.5 Repeated and verified X2.*  PLT 101* 108.0*     Lab Results  Component Value Date   TSH 0.65 10/05/2014     Lab Results  Component Value Date   PROBNP 81.0 10/05/2014         Assessment & Plan:

## 2014-10-05 NOTE — Patient Instructions (Addendum)
Pantoprazole (protonix) 40 mg   Take 30-60 min before first meal of the day and Pepcid 20 mg one bedtime until return to office - this is the best way to tell whether stomach acid is contributing to your problem.    GERD (REFLUX)  is an extremely common cause of respiratory symptoms just like yours , many times with no obvious heartburn at all.    It can be treated with medication, but also with lifestyle changes including avoidance of late meals, excessive alcohol, smoking cessation, and avoid fatty foods, chocolate, peppermint, colas, red wine, and acidic juices such as orange juice.  NO MINT OR MENTHOL PRODUCTS SO NO COUGH DROPS  USE SUGARLESS CANDY INSTEAD (Jolley ranchers or Stover's or Life Savers) or even ice chips will also do - the key is to swallow to prevent all throat clearing. NO OIL BASED VITAMINS - use powdered substitutes.  Try off albuterol to see what difference but for sure don't use it on return   Please remember to go to the lab and x-ray department downstairs for your tests - we will call you with the results when they are available.     Please schedule a follow up office visit in 4 weeks, sooner if needed with pfts with all meds in hand

## 2014-10-05 NOTE — Telephone Encounter (Signed)
Santiago Glad in lab has called stating that pt's WBC is 36,500. States she is looking at the smear. Dr.Wert notified.

## 2014-10-06 ENCOUNTER — Encounter: Payer: Self-pay | Admitting: Internal Medicine

## 2014-10-06 NOTE — Assessment & Plan Note (Addendum)
-   10/05/2014   Walked RA x one lap @ 185 stopped due to fatigue >> sob/ no desats/ slow pace - Spirometry 10/05/2014 > wnl   Symptoms are chronic/ progressive and  markedly disproportionate to objective findings and not clear this is a lung problem but pt does appear to have difficult airway management issues.  DDX of  difficult airways management all start with A and  include Adherence, Ace Inhibitors, Acid Reflux, Active Sinus Disease, Alpha 1 Antitripsin deficiency, Anxiety masquerading as Airways dz,  ABPA,  allergy(esp in young), Aspiration (esp in elderly), Adverse effects of DPI,  Active smokers, plus two Bs  = Bronchiectasis and Beta blocker use..and one C= CHF  ? Acid (or non-acid) GERD > always difficult to exclude as up to 75% of pts in some series report no assoc GI/ Heartburn symptoms> rec max (24h)  acid suppression and diet restrictions/ reviewed and instructions given in writing.   ? Anxiety / depression >usually dx of exclusion but much higher here in dd  ? Allergy / asthma> very unlikely as no better on prednisone> try off albuterol to see what difference it makes then return for full pfts off rx and MCT if dx remains in doubt  ? Adverse effects of dpi > agree with her stopping it  ? CHF > excluded with BNP < 100      Each maintenance medication was reviewed in detail including most importantly the difference between maintenance and as needed and under what circumstances the prns are to be used.  Please see instructions for details which were reviewed in writing and the patient given a copy.

## 2014-10-07 ENCOUNTER — Other Ambulatory Visit: Payer: Self-pay | Admitting: Hematology & Oncology

## 2014-10-08 NOTE — Progress Notes (Signed)
Quick Note:  Spoke with pt's sonand notified of results per Dr. Melvyn Novas. He verbalized understanding and denied any questions.  ______

## 2014-10-08 NOTE — Progress Notes (Signed)
Quick Note:  Spoke with pt's son and notified of results per Dr. Melvyn Novas. He verbalized understanding and denied any questions.  ______

## 2014-10-15 ENCOUNTER — Inpatient Hospital Stay (HOSPITAL_BASED_OUTPATIENT_CLINIC_OR_DEPARTMENT_OTHER)
Admission: EM | Admit: 2014-10-15 | Discharge: 2014-10-20 | DRG: 392 | Disposition: A | Discharge status: Home / Self Care | Payer: Medicare Other | Attending: Internal Medicine | Admitting: Internal Medicine

## 2014-10-15 ENCOUNTER — Emergency Department (HOSPITAL_BASED_OUTPATIENT_CLINIC_OR_DEPARTMENT_OTHER): Payer: Medicare Other

## 2014-10-15 ENCOUNTER — Encounter (HOSPITAL_BASED_OUTPATIENT_CLINIC_OR_DEPARTMENT_OTHER): Payer: Self-pay

## 2014-10-15 DIAGNOSIS — C9590 Leukemia, unspecified not having achieved remission: Secondary | ICD-10-CM | POA: Diagnosis not present

## 2014-10-15 DIAGNOSIS — R6881 Early satiety: Secondary | ICD-10-CM

## 2014-10-15 DIAGNOSIS — R7989 Other specified abnormal findings of blood chemistry: Secondary | ICD-10-CM

## 2014-10-15 DIAGNOSIS — K5792 Diverticulitis of intestine, part unspecified, without perforation or abscess without bleeding: Secondary | ICD-10-CM | POA: Diagnosis present

## 2014-10-15 DIAGNOSIS — R21 Rash and other nonspecific skin eruption: Secondary | ICD-10-CM | POA: Diagnosis present

## 2014-10-15 DIAGNOSIS — R1310 Dysphagia, unspecified: Secondary | ICD-10-CM

## 2014-10-15 DIAGNOSIS — C9110 Chronic lymphocytic leukemia of B-cell type not having achieved remission: Secondary | ICD-10-CM | POA: Diagnosis not present

## 2014-10-15 DIAGNOSIS — K8020 Calculus of gallbladder without cholecystitis without obstruction: Secondary | ICD-10-CM | POA: Diagnosis present

## 2014-10-15 DIAGNOSIS — D696 Thrombocytopenia, unspecified: Secondary | ICD-10-CM | POA: Diagnosis not present

## 2014-10-15 DIAGNOSIS — E871 Hypo-osmolality and hyponatremia: Secondary | ICD-10-CM

## 2014-10-15 DIAGNOSIS — E46 Unspecified protein-calorie malnutrition: Secondary | ICD-10-CM | POA: Diagnosis present

## 2014-10-15 DIAGNOSIS — Z6828 Body mass index (BMI) 28.0-28.9, adult: Secondary | ICD-10-CM | POA: Diagnosis not present

## 2014-10-15 DIAGNOSIS — M1990 Unspecified osteoarthritis, unspecified site: Secondary | ICD-10-CM | POA: Diagnosis present

## 2014-10-15 DIAGNOSIS — R932 Abnormal findings on diagnostic imaging of liver and biliary tract: Secondary | ICD-10-CM | POA: Diagnosis not present

## 2014-10-15 DIAGNOSIS — R0600 Dyspnea, unspecified: Secondary | ICD-10-CM | POA: Diagnosis not present

## 2014-10-15 DIAGNOSIS — R1032 Left lower quadrant pain: Secondary | ICD-10-CM | POA: Diagnosis not present

## 2014-10-15 DIAGNOSIS — J45909 Unspecified asthma, uncomplicated: Secondary | ICD-10-CM | POA: Diagnosis present

## 2014-10-15 DIAGNOSIS — C9510 Chronic leukemia of unspecified cell type not having achieved remission: Secondary | ICD-10-CM | POA: Diagnosis not present

## 2014-10-15 DIAGNOSIS — R0602 Shortness of breath: Secondary | ICD-10-CM

## 2014-10-15 DIAGNOSIS — R1030 Lower abdominal pain, unspecified: Secondary | ICD-10-CM | POA: Diagnosis not present

## 2014-10-15 DIAGNOSIS — K5730 Diverticulosis of large intestine without perforation or abscess without bleeding: Secondary | ICD-10-CM | POA: Diagnosis not present

## 2014-10-15 DIAGNOSIS — R161 Splenomegaly, not elsewhere classified: Secondary | ICD-10-CM | POA: Diagnosis present

## 2014-10-15 DIAGNOSIS — K5720 Diverticulitis of large intestine with perforation and abscess without bleeding: Secondary | ICD-10-CM | POA: Diagnosis not present

## 2014-10-15 DIAGNOSIS — E860 Dehydration: Secondary | ICD-10-CM | POA: Diagnosis present

## 2014-10-15 DIAGNOSIS — D6959 Other secondary thrombocytopenia: Secondary | ICD-10-CM | POA: Diagnosis present

## 2014-10-15 DIAGNOSIS — K5780 Diverticulitis of intestine, part unspecified, with perforation and abscess without bleeding: Secondary | ICD-10-CM | POA: Diagnosis not present

## 2014-10-15 DIAGNOSIS — Z7952 Long term (current) use of systemic steroids: Secondary | ICD-10-CM | POA: Diagnosis not present

## 2014-10-15 DIAGNOSIS — Z79899 Other long term (current) drug therapy: Secondary | ICD-10-CM | POA: Diagnosis not present

## 2014-10-15 DIAGNOSIS — D509 Iron deficiency anemia, unspecified: Secondary | ICD-10-CM | POA: Diagnosis not present

## 2014-10-15 DIAGNOSIS — Z833 Family history of diabetes mellitus: Secondary | ICD-10-CM | POA: Diagnosis not present

## 2014-10-15 DIAGNOSIS — R945 Abnormal results of liver function studies: Secondary | ICD-10-CM

## 2014-10-15 DIAGNOSIS — K838 Other specified diseases of biliary tract: Secondary | ICD-10-CM | POA: Diagnosis not present

## 2014-10-15 LAB — URINALYSIS, ROUTINE W REFLEX MICROSCOPIC
BILIRUBIN URINE: NEGATIVE
GLUCOSE, UA: NEGATIVE mg/dL
Ketones, ur: NEGATIVE mg/dL
Leukocytes, UA: NEGATIVE
Nitrite: NEGATIVE
Protein, ur: NEGATIVE mg/dL
SPECIFIC GRAVITY, URINE: 1.009 (ref 1.005–1.030)
UROBILINOGEN UA: 0.2 mg/dL (ref 0.0–1.0)
pH: 7 (ref 5.0–8.0)

## 2014-10-15 LAB — LIPASE, BLOOD: Lipase: 30 U/L (ref 11–59)

## 2014-10-15 LAB — CBC WITH DIFFERENTIAL/PLATELET
BASOS PCT: 0 % (ref 0–1)
Basophils Absolute: 0 10*3/uL (ref 0.0–0.1)
EOS ABS: 0 10*3/uL (ref 0.0–0.7)
Eosinophils Relative: 0 % (ref 0–5)
HCT: 34 % — ABNORMAL LOW (ref 36.0–46.0)
HEMOGLOBIN: 10.8 g/dL — AB (ref 12.0–15.0)
LYMPHS PCT: 89 % — AB (ref 12–46)
Lymphs Abs: 28.6 10*3/uL — ABNORMAL HIGH (ref 0.7–4.0)
MCH: 22.7 pg — ABNORMAL LOW (ref 26.0–34.0)
MCHC: 31.8 g/dL (ref 30.0–36.0)
MCV: 71.6 fL — ABNORMAL LOW (ref 78.0–100.0)
Monocytes Absolute: 0.3 10*3/uL (ref 0.1–1.0)
Monocytes Relative: 1 % — ABNORMAL LOW (ref 3–12)
NEUTROS ABS: 3.2 10*3/uL (ref 1.7–7.7)
Neutrophils Relative %: 10 % — ABNORMAL LOW (ref 43–77)
Platelets: 122 10*3/uL — ABNORMAL LOW (ref 150–400)
RBC: 4.75 MIL/uL (ref 3.87–5.11)
RDW: 13.9 % (ref 11.5–15.5)
WBC: 32.1 10*3/uL — ABNORMAL HIGH (ref 4.0–10.5)

## 2014-10-15 LAB — COMPREHENSIVE METABOLIC PANEL
ALT: 11 U/L (ref 0–35)
ANION GAP: 8 (ref 5–15)
AST: 18 U/L (ref 0–37)
Albumin: 3.5 g/dL (ref 3.5–5.2)
Alkaline Phosphatase: 70 U/L (ref 39–117)
BUN: 10 mg/dL (ref 6–23)
CALCIUM: 8.1 mg/dL — AB (ref 8.4–10.5)
CO2: 26 mmol/L (ref 19–32)
CREATININE: 0.6 mg/dL (ref 0.50–1.10)
Chloride: 94 mmol/L — ABNORMAL LOW (ref 96–112)
GFR, EST NON AFRICAN AMERICAN: 82 mL/min — AB (ref >90)
GLUCOSE: 184 mg/dL — AB (ref 70–99)
Potassium: 4 mmol/L (ref 3.5–5.1)
Sodium: 128 mmol/L — ABNORMAL LOW (ref 135–145)
TOTAL PROTEIN: 5.9 g/dL — AB (ref 6.0–8.3)
Total Bilirubin: 0.5 mg/dL (ref 0.3–1.2)

## 2014-10-15 LAB — URINE MICROSCOPIC-ADD ON

## 2014-10-15 LAB — OCCULT BLOOD X 1 CARD TO LAB, STOOL: FECAL OCCULT BLD: NEGATIVE

## 2014-10-15 LAB — I-STAT CG4 LACTIC ACID, ED: Lactic Acid, Venous: 1.26 mmol/L (ref 0.5–2.0)

## 2014-10-15 MED ORDER — SODIUM CHLORIDE 0.9 % IV BOLUS (SEPSIS)
500.0000 mL | Freq: Once | INTRAVENOUS | Status: AC
  Administered 2014-10-15: 500 mL via INTRAVENOUS

## 2014-10-15 MED ORDER — BARIUM SULFATE 2 % PO SUSP
450.0000 mL | ORAL | Status: AC
  Filled 2014-10-15 (×2): qty 450

## 2014-10-15 MED ORDER — METRONIDAZOLE IN NACL 5-0.79 MG/ML-% IV SOLN
500.0000 mg | Freq: Once | INTRAVENOUS | Status: AC
  Administered 2014-10-15: 500 mg via INTRAVENOUS
  Filled 2014-10-15: qty 100

## 2014-10-15 MED ORDER — CIPROFLOXACIN IN D5W 400 MG/200ML IV SOLN
400.0000 mg | Freq: Once | INTRAVENOUS | Status: AC
  Administered 2014-10-15: 400 mg via INTRAVENOUS
  Filled 2014-10-15: qty 200

## 2014-10-15 NOTE — ED Notes (Addendum)
Son with pt-pt speaks minimal English-son states pt with intermittent abd pain x 10 days-urinary freq at night

## 2014-10-15 NOTE — ED Notes (Signed)
Pt returned from CT °

## 2014-10-15 NOTE — ED Notes (Signed)
CT tech at bedside for oral contrast.

## 2014-10-15 NOTE — ED Provider Notes (Signed)
CSN: [id number]     Arrival date & time 10/15/14  1334 History   First MD Initiated Contact with Patient 10/15/14 1408     Chief Complaint  Patient presents with  . Abdominal Pain     (Consider location/radiation/quality/duration/timing/severity/associated sxs/prior Treatment) HPI   Stefanie Henry is a 81 y.o. female with past medical history significant for CLL complaining of bloating and left lower quadrant abdominal pain worsening over the course of 10 days associated with frequent normally formed, non-melanotic stool. Patient reports dysuria, urinary frequency with sensation of incomplete voiding starting 3 days ago. He states the pain is severe and it is relieved with bowel movements and in the afternoon. She reports a fever (MAXIMUM TEMPERATURE 99 x5 days ago which has resolved. States that the lower abdominal pain is severe, worsened in the morning and alleviated in the afternoon. Patient denies nausea, vomiting, chest pain, shortness of breath. On review of systems she notes a slightly blood-streaked stool states that this is not atypical because she has hemorrhoids. Son states that she's never had a colonoscopy, no recent antibiotics. Has referral to gastroenterologist but has not made the visit yet.  Past Medical History  Diagnosis Date  . Asthma   . Arthritis   . Shingles   . Anemia, iron deficiency 06/06/2012  . Chronic lymphatic leukemia   . Gallstones    History reviewed. No pertinent past surgical history. Family History  Problem Relation Age of Onset  . Diabetes Father    History  Substance Use Topics  . Smoking status: Never Smoker   . Smokeless tobacco: Never Used     Comment: PT NEVER SMOKED  . Alcohol Use: No   OB History    No data available     Review of Systems  10 systems reviewed and found to be negative, except as noted in the HPI.   Allergies  Contrast media; Fruit & vegetable daily; and Ibuprofen  Home Medications   Prior to Admission  medications   Medication Sig Start Date End Date Taking? Authorizing Provider  albuterol (PROVENTIL HFA;VENTOLIN HFA) 108 (90 BASE) MCG/ACT inhaler Inhale 1-2 puffs into the lungs every 6 (six) hours as needed for wheezing or shortness of breath.    Historical Provider, MD  calcitonin, salmon, (MIACALCIN/FORTICAL) 200 UNIT/ACT nasal spray Place 1 spray into alternate nostrils daily. 06/15/14   Lavon Paganini Angiulli, PA-C  famotidine (PEPCID) 20 MG tablet Take 1 tablet (20 mg total) by mouth daily. 06/15/14   Lavon Paganini Angiulli, PA-C  gabapentin (NEURONTIN) 100 MG capsule Take 1 capsule (100 mg total) by mouth daily. 06/15/14   Lavon Paganini Angiulli, PA-C  pantoprazole (PROTONIX) 40 MG tablet Take 1 tablet (40 mg total) by mouth daily. Take 30-60 min before first meal of the day 10/05/14   Tanda Rockers, MD  predniSONE (DELTASONE) 10 MG tablet TAKE 1 TABLET BY MOUTH ONCE DAILY 10/03/14   [name], MD  predniSONE (DELTASONE) 10 MG tablet TAKE 1 TABLET BY MOUTH ONCE DAILY 10/07/14   [name], MD  Probiotic Product (PROBIOTIC DAILY PO) Take 1 capsule by mouth daily.    Historical Provider, MD  Simethicone (GAS-X PO) Take 2 capsules by mouth daily.     Historical Provider, MD   BP 105/55 mmHg  Pulse 104  Temp(Src) 99.9 F (37.7 C) (Oral)  Resp 18  Ht 4\' 8"  (1.422 m)  Wt 125 lb (56.7 kg)  BMI 28.04 kg/m2  SpO2 96% Physical Exam  Constitutional:  She is oriented to person, place, and time. She appears well-developed and well-nourished. No distress.  HENT:  Head: Normocephalic.  Mouth/Throat: Oropharynx is clear and moist.  Eyes: Conjunctivae and EOM are normal. Pupils are equal, round, and reactive to light.  Neck: Normal range of motion.  Cardiovascular: Normal rate, regular rhythm and intact distal pulses.   Pulmonary/Chest: Effort normal and breath sounds normal. No stridor. No respiratory distress. She has no wheezes. She has no rales. She exhibits no tenderness.  Abdominal: Soft. Bowel  sounds are normal. She exhibits no distension and no mass. There is tenderness. There is no rebound and no guarding.  Normal active bowel sounds, mild tenderness to deep palpation of the left lower and left upper quadrant, no guarding or rebound.  Genitourinary:  Rectal exam a chaperoned by technician: External hemorrhoids, normal rectal tone, no stool in the rectal vault.  Musculoskeletal: Normal range of motion.  Neurological: She is alert and oriented to person, place, and time.  Psychiatric: She has a normal mood and affect.  Nursing note and vitals reviewed.   ED Course  Procedures (including critical care time) Labs Review Labs Reviewed  URINALYSIS, ROUTINE W REFLEX MICROSCOPIC - Abnormal; Notable for the following:    Hgb urine dipstick MODERATE (*)    All other components within normal limits  CBC WITH DIFFERENTIAL/PLATELET - Abnormal; Notable for the following:    WBC 32.1 (*)    Hemoglobin 10.8 (*)    HCT 34.0 (*)    MCV 71.6 (*)    MCH 22.7 (*)    Platelets 122 (*)    Neutrophils Relative % 10 (*)    Lymphocytes Relative 89 (*)    Monocytes Relative 1 (*)    Lymphs Abs 28.6 (*)    All other components within normal limits  COMPREHENSIVE METABOLIC PANEL - Abnormal; Notable for the following:    Sodium 128 (*)    Chloride 94 (*)    Glucose, Bld 184 (*)    Calcium 8.1 (*)    Total Protein 5.9 (*)    GFR calc non Af Amer 82 (*)    All other components within normal limits  URINE CULTURE  CULTURE, BLOOD (ROUTINE X 2)  CULTURE, BLOOD (ROUTINE X 2)  LIPASE, BLOOD  URINE MICROSCOPIC-ADD ON  OCCULT BLOOD X 1 CARD TO LAB, STOOL  I-STAT CG4 LACTIC ACID, ED    Imaging Review No results found.   EKG Interpretation None      MDM   Final diagnoses:  LLQ pain    Filed Vitals:   10/15/14 1345  BP: 105/55  Pulse: 104  Temp: 99.9 F (37.7 C)  TempSrc: Oral  Resp: 18  Height: 4\' 8"  (1.422 m)  Weight: 125 lb (56.7 kg)  SpO2: 96%    Medications  sodium  chloride 0.9 % bolus 500 mL (500 mLs Intravenous New Bag/Given 10/15/14 1530)    Stefanie Henry is a pleasant 81 y.o. female presenting with resolved fever, left lower quadrant abdominal pain, abdominal bloating urinary frequency with dysuria. Initially blood pressure was soft, and upper 80s. Basic blood work, urinalysis pending. She is declining pain medication. Abdominal exam is nonsurgical.  Blood work shows leukocytosis of 32 (CLL) blood cultures are drawn, fecal occult blood is negative, serial abdominal exam is benign. Urinalysis has hemoglobin but no signs of infection, urine culture is pending. Normal lipase, LFTs, early bilirubin. Corrected sodium 130. She given 500 mL bolus  Patient has allergic reaction to oral contrast  states that she both has itching and abdominal pain. Discussed with attending physician recommends non-con CT.   Case signed out PA Sanders at shift change: Plan is to follow-up CT.   Monico Blitz, PA-C 10/15/14 [place], MD 10/20/14 [phone]

## 2014-10-15 NOTE — ED Provider Notes (Signed)
Patient received in signout from [place] at shift change.  Briefly, a 81-year-old female with left lower abdominal pain and bloating for the past 10 days. She does admit to some blood in stools but does have history of hemorrhoids.  Lab work obtained which appears baseline for patient given her history of CLL.  Hemoccult was negative.    Plan:  CT pending for eval of diverticulitis.  Will follow results and dispo accordingly.  Results for orders placed or performed during the hospital encounter of 10/15/14  Urinalysis, Routine w reflex microscopic  Result Value Ref Range   Color, Urine YELLOW YELLOW   APPearance CLEAR CLEAR   Specific Gravity, Urine 1.009 1.005 - 1.030   pH 7.0 5.0 - 8.0   Glucose, UA NEGATIVE NEGATIVE mg/dL   Hgb urine dipstick MODERATE (A) NEGATIVE   Bilirubin Urine NEGATIVE NEGATIVE   Ketones, ur NEGATIVE NEGATIVE mg/dL   Protein, ur NEGATIVE NEGATIVE mg/dL   Urobilinogen, UA 0.2 0.0 - 1.0 mg/dL   Nitrite NEGATIVE NEGATIVE   Leukocytes, UA NEGATIVE NEGATIVE  CBC with Differential  Result Value Ref Range   WBC 32.1 (H) 4.0 - 10.5 K/uL   RBC 4.75 3.87 - 5.11 MIL/uL   Hemoglobin 10.8 (L) 12.0 - 15.0 g/dL   HCT 34.0 (L) 36.0 - 46.0 %   MCV 71.6 (L) 78.0 - 100.0 fL   MCH 22.7 (L) 26.0 - 34.0 pg   MCHC 31.8 30.0 - 36.0 g/dL   RDW 13.9 11.5 - 15.5 %   Platelets 122 (L) 150 - 400 K/uL   Neutrophils Relative % 10 (L) 43 - 77 %   Lymphocytes Relative 89 (H) 12 - 46 %   Monocytes Relative 1 (L) 3 - 12 %   Eosinophils Relative 0 0 - 5 %   Basophils Relative 0 0 - 1 %   Neutro Abs 3.2 1.7 - 7.7 K/uL   Lymphs Abs 28.6 (H) 0.7 - 4.0 K/uL   Monocytes Absolute 0.3 0.1 - 1.0 K/uL   Eosinophils Absolute 0.0 0.0 - 0.7 K/uL   Basophils Absolute 0.0 0.0 - 0.1 K/uL   RBC Morphology ELLIPTOCYTES    WBC Morphology ATYPICAL LYMPHOCYTES   Comprehensive metabolic panel  Result Value Ref Range   Sodium 128 (L) 135 - 145 mmol/L   Potassium 4.0 3.5 - 5.1 mmol/L   Chloride 94  (L) 96 - 112 mmol/L   CO2 26 19 - 32 mmol/L   Glucose, Bld 184 (H) 70 - 99 mg/dL   BUN 10 6 - 23 mg/dL   Creatinine, Ser 0.60 0.50 - 1.10 mg/dL   Calcium 8.1 (L) 8.4 - 10.5 mg/dL   Total Protein 5.9 (L) 6.0 - 8.3 g/dL   Albumin 3.5 3.5 - 5.2 g/dL   AST 18 0 - 37 U/L   ALT 11 0 - 35 U/L   Alkaline Phosphatase 70 39 - 117 U/L   Total Bilirubin 0.5 0.3 - 1.2 mg/dL   GFR calc non Af Amer 82 (L) >90 mL/min   GFR calc Af Amer >90 >90 mL/min   Anion gap 8 5 - 15  Lipase, blood  Result Value Ref Range   Lipase 30 11 - 59 U/L  Urine microscopic-add on  Result Value Ref Range   Squamous Epithelial / LPF RARE RARE   WBC, UA 0-2 <3 WBC/hpf   RBC / HPF 11-20 <3 RBC/hpf   Bacteria, UA RARE RARE   Urine-Other MUCOUS PRESENT   Occult  blood card to lab, stool  Result Value Ref Range   Fecal Occult Bld NEGATIVE NEGATIVE  I-Stat CG4 Lactic Acid, ED  Result Value Ref Range   Lactic Acid, Venous 1.26 0.5 - 2.0 mmol/L   Ct Abdomen Pelvis Wo Contrast  10/15/2014   CLINICAL DATA:  Lower abdominal pain for 10-15 days.  EXAM: CT ABDOMEN AND PELVIS WITHOUT CONTRAST  TECHNIQUE: Multidetector CT imaging of the abdomen and pelvis was performed following the standard protocol without IV contrast.  COMPARISON:  Abdominal ultrasound 07/17/2014. CT abdomen and pelvis 06/08/2014.  FINDINGS: Mild scarring is noted in the lung bases, most notably in the right middle lobe and similar to the prior CT.  The spleen is again noted to be mildly enlarged. Gallstones demonstrated on the prior ultrasound are not clearly seen by CT. The liver, adrenal glands, left kidney, and pancreas have an unremarkable unenhanced appearance. 10 mm right renal cyst is unchanged.  Oral contrast is present in loops of nondilated small and large bowel to the level of the proximal transverse colon without evidence of obstruction. There is some soft tissue stranding/thickening along the sigmoid colon with suggestion of a degree of sigmoid colonic  wall thickening, although evaluation is partially limited by underdistention. Between the sigmoid colon and bladder is a 2.4 x 1.7 cm low-density focus with a locule of gas suspicious for a small fluid collection. There is asymmetric, smooth bladder wall thickening in this region likely secondary to adjacent inflammation. Left-sided colonic diverticulosis is again seen. Appendix is unremarkable.  Calcified uterine fibroids are again seen. Mild atherosclerotic vascular calcification is noted. No free fluid or enlarged lymph nodes are identified. Chronic L1 compression fracture is again noted as well as chronic fusion across the L5-S1 disc space. SI joint degenerative changes are also noted as well as lower lumbar facet arthrosis.  IMPRESSION: Colonic diverticulosis with evidence of mild inflammatory changes about the sigmoid colon most likely reflecting diverticulitis with a likely 2.4 cm contained perforation/small abscess between the sigmoid colon and bladder.   Electronically Signed   By: Logan Bores   On: 10/15/2014 21:06   Dg Chest 2 View  10/05/2014   CLINICAL DATA:  Dyspnea for 3 months.  History of asthma.  EXAM: CHEST  2 VIEW  COMPARISON:  12/13/2011  FINDINGS: There is chronic right middle lobe scarring. There is no focal parenchymal opacity, pleural effusion, or pneumothorax. There is stable cardiomegaly.  The osseous structures are unremarkable.  IMPRESSION: No active cardiopulmonary disease.   Electronically Signed   By: Kathreen Devoid   On: 10/05/2014 13:13    9:19 PM CT results revealing diverticulitis with perforation/abscess formation.  General surgery consulted, Dr. Hassell Done-- patient may need perc drain, will try to avoid surgery.  Agrees with abx choice.  Will admit to medicine service for further management.  Patient to be transferred to [facility], Dr. Hal Hope accepting.  EMTALA completed.  Larene Pickett, PA-C 10/16/14 [id number]  Leonard Schwartz, MD 10/20/14 [phone]

## 2014-10-15 NOTE — ED Notes (Signed)
Provider at the bedside.  

## 2014-10-15 NOTE — ED Notes (Signed)
Patient transported to CT 

## 2014-10-16 ENCOUNTER — Inpatient Hospital Stay (HOSPITAL_COMMUNITY): Payer: Medicare Other

## 2014-10-16 ENCOUNTER — Encounter (HOSPITAL_COMMUNITY): Payer: Self-pay | Admitting: Internal Medicine

## 2014-10-16 DIAGNOSIS — K5720 Diverticulitis of large intestine with perforation and abscess without bleeding: Secondary | ICD-10-CM

## 2014-10-16 DIAGNOSIS — E871 Hypo-osmolality and hyponatremia: Secondary | ICD-10-CM

## 2014-10-16 DIAGNOSIS — C9110 Chronic lymphocytic leukemia of B-cell type not having achieved remission: Secondary | ICD-10-CM

## 2014-10-16 DIAGNOSIS — IMO0002 Reserved for concepts with insufficient information to code with codable children: Secondary | ICD-10-CM

## 2014-10-16 DIAGNOSIS — D696 Thrombocytopenia, unspecified: Secondary | ICD-10-CM

## 2014-10-16 HISTORY — DX: Hypo-osmolality and hyponatremia: E87.1

## 2014-10-16 HISTORY — DX: Thrombocytopenia, unspecified: D69.6

## 2014-10-16 HISTORY — DX: Diverticulitis of large intestine with perforation and abscess without bleeding: K57.20

## 2014-10-16 HISTORY — DX: Reserved for concepts with insufficient information to code with codable children: IMO0002

## 2014-10-16 LAB — CBC WITH DIFFERENTIAL/PLATELET
BASOS ABS: 0 10*3/uL (ref 0.0–0.1)
Basophils Relative: 0 % (ref 0–1)
Eosinophils Absolute: 0 10*3/uL (ref 0.0–0.7)
Eosinophils Relative: 0 % (ref 0–5)
HCT: 33.9 % — ABNORMAL LOW (ref 36.0–46.0)
Hemoglobin: 10.8 g/dL — ABNORMAL LOW (ref 12.0–15.0)
LYMPHS ABS: 17.4 10*3/uL — AB (ref 0.7–4.0)
LYMPHS PCT: 85 % — AB (ref 12–46)
MCH: 23.4 pg — ABNORMAL LOW (ref 26.0–34.0)
MCHC: 31.9 g/dL (ref 30.0–36.0)
MCV: 73.5 fL — ABNORMAL LOW (ref 78.0–100.0)
MONO ABS: 0.6 10*3/uL (ref 0.1–1.0)
Monocytes Relative: 3 % (ref 3–12)
NEUTROS PCT: 12 % — AB (ref 43–77)
Neutro Abs: 2.5 10*3/uL (ref 1.7–7.7)
PLATELETS: 82 10*3/uL — AB (ref 150–400)
RBC: 4.61 MIL/uL (ref 3.87–5.11)
RDW: 14 % (ref 11.5–15.5)
WBC: 20.5 10*3/uL — ABNORMAL HIGH (ref 4.0–10.5)

## 2014-10-16 LAB — COMPREHENSIVE METABOLIC PANEL
ALK PHOS: 147 U/L — AB (ref 39–117)
ALT: 158 U/L — AB (ref 0–35)
AST: 310 U/L — ABNORMAL HIGH (ref 0–37)
Albumin: 3.3 g/dL — ABNORMAL LOW (ref 3.5–5.2)
Anion gap: 8 (ref 5–15)
BILIRUBIN TOTAL: 0.9 mg/dL (ref 0.3–1.2)
BUN: 6 mg/dL (ref 6–23)
CALCIUM: 8 mg/dL — AB (ref 8.4–10.5)
CO2: 27 mmol/L (ref 19–32)
CREATININE: 0.59 mg/dL (ref 0.50–1.10)
Chloride: 100 mmol/L (ref 96–112)
GFR, EST NON AFRICAN AMERICAN: 82 mL/min — AB (ref >90)
GLUCOSE: 132 mg/dL — AB (ref 70–99)
Potassium: 3.6 mmol/L (ref 3.5–5.1)
SODIUM: 135 mmol/L (ref 135–145)
Total Protein: 5.4 g/dL — ABNORMAL LOW (ref 6.0–8.3)

## 2014-10-16 LAB — GLUCOSE, CAPILLARY
GLUCOSE-CAPILLARY: 156 mg/dL — AB (ref 70–99)
GLUCOSE-CAPILLARY: 164 mg/dL — AB (ref 70–99)
Glucose-Capillary: 160 mg/dL — ABNORMAL HIGH (ref 70–99)

## 2014-10-16 MED ORDER — DEXTROSE-NACL 5-0.9 % IV SOLN
INTRAVENOUS | Status: AC
  Administered 2014-10-16: 75 mL/h via INTRAVENOUS
  Administered 2014-10-16: 02:00:00 via INTRAVENOUS

## 2014-10-16 MED ORDER — ACETAMINOPHEN 650 MG RE SUPP: 650.0000 mg | Freq: Four times a day (QID) | RECTAL | Status: DC | PRN

## 2014-10-16 MED ORDER — PIPERACILLIN-TAZOBACTAM 3.375 G IVPB
3.3750 g | Freq: Three times a day (TID) | INTRAVENOUS | Status: DC
  Administered 2014-10-16 – 2014-10-20 (×12): 3.375 g via INTRAVENOUS
  Filled 2014-10-16 (×13): qty 50

## 2014-10-16 MED ORDER — METHYLPREDNISOLONE SODIUM SUCC 40 MG IJ SOLR
20.0000 mg | Freq: Every day | INTRAMUSCULAR | Status: DC
  Administered 2014-10-16 – 2014-10-20 (×5): 20 mg via INTRAVENOUS
  Filled 2014-10-16 (×5): qty 0.5

## 2014-10-16 MED ORDER — MORPHINE SULFATE 2 MG/ML IJ SOLN
1.0000 mg | INTRAMUSCULAR | Status: DC | PRN
  Administered 2014-10-16: 1 mg via INTRAVENOUS
  Filled 2014-10-16: qty 1

## 2014-10-16 MED ORDER — ALBUTEROL SULFATE (2.5 MG/3ML) 0.083% IN NEBU: 2.5000 mg | INHALATION_SOLUTION | Freq: Four times a day (QID) | RESPIRATORY_TRACT | Status: DC | PRN

## 2014-10-16 MED ORDER — ONDANSETRON HCL 4 MG/2ML IJ SOLN: 4.0000 mg | Freq: Four times a day (QID) | INTRAMUSCULAR | Status: DC | PRN

## 2014-10-16 MED ORDER — LIP MEDEX EX OINT
TOPICAL_OINTMENT | CUTANEOUS | Status: AC
  Administered 2014-10-16: 1
  Filled 2014-10-16: qty 7

## 2014-10-16 MED ORDER — ACETAMINOPHEN 325 MG PO TABS: 650.0000 mg | ORAL_TABLET | Freq: Four times a day (QID) | ORAL | Status: DC | PRN

## 2014-10-16 MED ORDER — ONDANSETRON HCL 4 MG PO TABS: 4.0000 mg | ORAL_TABLET | Freq: Four times a day (QID) | ORAL | Status: DC | PRN

## 2014-10-16 MED ORDER — ALBUTEROL SULFATE HFA 108 (90 BASE) MCG/ACT IN AERS: 1.0000 | INHALATION_SPRAY | Freq: Four times a day (QID) | RESPIRATORY_TRACT | Status: DC | PRN

## 2014-10-16 NOTE — Consult Note (Signed)
Reason for Consult: Sigmoid Diverticulitis with colon perforation    Referring Physician:  Rise Patience, MD  Stefanie Henry is an 81 y.o. female.  HPI: Pt presents with complaints of intermittent abdominal pain, bloating, no real fever, and urinary frequency.  Her son says she has not felt good since lumbar fracture 3 months ago. She has kept herself on some soft foods, but mostly full liquids, she fill up rapidly. She has not lost weight on this diet.  She came to the ED now with increased abdominal pain LLQ that was so severe she told her son she could not stand it.  She has had some loose stools, some diarrhea on some days, but not really getting a chronic hx of diarrhea. Work up in the ED shows a TM 99.9, some tachycardia, WBC 32.1, with 122K platelet count.  UA was normal. CT scan shows: Colonic diverticulosis with evidence of mild inflammatory changes about the sigmoid colon most likely reflecting diverticulitis with a likely 2.4 cm contained perforation/small abscess between the sigmoid colon and the bladder. She has received one dose of Cipro, Flagyl, and Zosyn so far.  WBC has dropped from 32K to 20.5 K.  Currently she is on Zosyn and steroids.  She has a diagnosis of chronic lymphatic leukemia.   We are ask to see.     Past Medical History  Diagnosis Date  Chronic lymphatic leukemia   Gallstones   Asthma   Arthritis   Shingles   Anemia, iron deficiency 06/06/2012          History reviewed. No pertinent past surgical history.  Family History  Problem Relation Age of Onset  . Diabetes Father     Social History:  reports that she has never smoked. She has never used smokeless tobacco. She reports that she does not drink alcohol or use illicit drugs.  Allergies:  Allergies  Allergen Reactions  . Contrast Media [Iodinated Diagnostic Agents] Other (See Comments)    Stomach discomfort  . Fruit & Vegetable Daily [Nutritional Supplements] Other (See Comments)    Citrus  fruit causes stomach discomfort  . Ibuprofen Other (See Comments)    Stomach discomfort    Medications:  Prior to Admission:  Prescriptions prior to admission  Medication Sig Dispense Refill Last Dose  . albuterol (PROVENTIL HFA;VENTOLIN HFA) 108 (90 BASE) MCG/ACT inhaler Inhale 1-2 puffs into the lungs every 6 (six) hours as needed for wheezing or shortness of breath.   10/15/2014 at Unknown time  . calcitonin, salmon, (MIACALCIN/FORTICAL) 200 UNIT/ACT nasal spray Place 1 spray into alternate nostrils daily. 3.7 mL 12 10/12/2014 at Unknown time  . famotidine (PEPCID) 20 MG tablet Take 1 tablet (20 mg total) by mouth daily. 30 tablet 1 10/15/2014 at Unknown time  . gabapentin (NEURONTIN) 100 MG capsule Take 1 capsule (100 mg total) by mouth daily. 30 capsule 1 10/15/2014 at Unknown time  . predniSONE (DELTASONE) 10 MG tablet TAKE 1 TABLET BY MOUTH ONCE DAILY 30 tablet 0 10/15/2014 at Unknown time  . Simethicone (GAS-X PO) Take 2 capsules by mouth daily.    10/15/2014 at Unknown time  . pantoprazole (PROTONIX) 40 MG tablet Take 1 tablet (40 mg total) by mouth daily. Take 30-60 min before first meal of the day (Patient not taking: Reported on 10/16/2014) 30 tablet 2   . predniSONE (DELTASONE) 10 MG tablet TAKE 1 TABLET BY MOUTH ONCE DAILY 30 tablet 1 Taking   Scheduled: . lip balm      .  methylPREDNISolone (SOLU-MEDROL) injection  20 mg Intravenous Daily  . piperacillin-tazobactam (ZOSYN)  IV  3.375 g Intravenous 3 times per day   Continuous: . dextrose 5 % and 0.9% NaCl 75 mL/hr at 10/16/14 0207   HYW:[id number] **OR** acetaminophen, albuterol, morphine injection, ondansetron **OR** ondansetron (ZOFRAN) IV Anti-infectives    Start     Dose/Rate Route Frequency Ordered Stop   10/16/14 0245  piperacillin-tazobactam (ZOSYN) IVPB 3.375 g     3.375 g 12.5 mL/hr over 240 Minutes Intravenous 3 times per day 10/16/14 0238     10/15/14 2145  ciprofloxacin (CIPRO) IVPB 400 mg     400 mg 200 mL/hr  over 60 Minutes Intravenous  Once 10/15/14 2137 10/15/14 2338   10/15/14 2145  metroNIDAZOLE (FLAGYL) IVPB 500 mg     500 mg 100 mL/hr over 60 Minutes Intravenous  Once 10/15/14 2137 10/16/14 0018      Results for orders placed or performed during the hospital encounter of 10/15/14 (from the past 48 hour(s))  Urinalysis, Routine w reflex microscopic     Status: Abnormal   Collection Time: 10/15/14  1:40 PM  Result Value Ref Range   Color, Urine YELLOW YELLOW   APPearance CLEAR CLEAR   Specific Gravity, Urine 1.009 1.005 - 1.030   pH 7.0 5.0 - 8.0   Glucose, UA NEGATIVE NEGATIVE mg/dL   Hgb urine dipstick MODERATE (A) NEGATIVE   Bilirubin Urine NEGATIVE NEGATIVE   Ketones, ur NEGATIVE NEGATIVE mg/dL   Protein, ur NEGATIVE NEGATIVE mg/dL   Urobilinogen, UA 0.2 0.0 - 1.0 mg/dL   Nitrite NEGATIVE NEGATIVE   Leukocytes, UA NEGATIVE NEGATIVE  Urine microscopic-add on     Status: None   Collection Time: 10/15/14  1:40 PM  Result Value Ref Range   Squamous Epithelial / LPF RARE RARE   WBC, UA 0-2 <3 WBC/hpf   RBC / HPF 11-20 <3 RBC/hpf   Bacteria, UA RARE RARE   Urine-Other MUCOUS PRESENT   CBC with Differential     Status: Abnormal   Collection Time: 10/15/14  3:00 PM  Result Value Ref Range   WBC 32.1 (H) 4.0 - 10.5 K/uL   RBC 4.75 3.87 - 5.11 MIL/uL   Hemoglobin 10.8 (L) 12.0 - 15.0 g/dL   HCT 34.0 (L) 36.0 - 46.0 %   MCV 71.6 (L) 78.0 - 100.0 fL   MCH 22.7 (L) 26.0 - 34.0 pg   MCHC 31.8 30.0 - 36.0 g/dL   RDW 13.9 11.5 - 15.5 %   Platelets 122 (L) 150 - 400 K/uL   Neutrophils Relative % 10 (L) 43 - 77 %   Lymphocytes Relative 89 (H) 12 - 46 %   Monocytes Relative 1 (L) 3 - 12 %   Eosinophils Relative 0 0 - 5 %   Basophils Relative 0 0 - 1 %   Neutro Abs 3.2 1.7 - 7.7 K/uL   Lymphs Abs 28.6 (H) 0.7 - 4.0 K/uL   Monocytes Absolute 0.3 0.1 - 1.0 K/uL   Eosinophils Absolute 0.0 0.0 - 0.7 K/uL   Basophils Absolute 0.0 0.0 - 0.1 K/uL   RBC Morphology ELLIPTOCYTES      Comment: SPECIMEN CHECKED FOR CLOTS   WBC Morphology ATYPICAL LYMPHOCYTES     Comment: TOXIC GRANULATION  Comprehensive metabolic panel     Status: Abnormal   Collection Time: 10/15/14  3:00 PM  Result Value Ref Range   Sodium 128 (L) 135 - 145 mmol/L   Potassium 4.0 3.5 -  5.1 mmol/L   Chloride 94 (L) 96 - 112 mmol/L   CO2 26 19 - 32 mmol/L   Glucose, Bld 184 (H) 70 - 99 mg/dL   BUN 10 6 - 23 mg/dL   Creatinine, Ser 0.60 0.50 - 1.10 mg/dL   Calcium 8.1 (L) 8.4 - 10.5 mg/dL   Total Protein 5.9 (L) 6.0 - 8.3 g/dL   Albumin 3.5 3.5 - 5.2 g/dL   AST 18 0 - 37 U/L   ALT 11 0 - 35 U/L   Alkaline Phosphatase 70 39 - 117 U/L   Total Bilirubin 0.5 0.3 - 1.2 mg/dL   GFR calc non Af Amer 82 (L) >90 mL/min   GFR calc Af Amer >90 >90 mL/min    Comment: (NOTE) The eGFR has been calculated using the CKD EPI equation. This calculation has not been validated in all clinical situations. eGFR's persistently <90 mL/min signify possible Chronic Kidney Disease.    Anion gap 8 5 - 15  Lipase, blood     Status: None   Collection Time: 10/15/14  3:00 PM  Result Value Ref Range   Lipase 30 11 - 59 U/L  Blood culture (routine x 2)     Status: None (Preliminary result)   Collection Time: 10/15/14  3:05 PM  Result Value Ref Range   Specimen Description BLOOD RIGHT HAND    Special Requests BOTTLES DRAWN AEROBIC AND ANAEROBIC 5CC EACH    Culture             BLOOD CULTURE RECEIVED NO GROWTH TO DATE CULTURE WILL BE HELD FOR 5 DAYS BEFORE ISSUING A FINAL NEGATIVE REPORT Performed at [facility]    Report Status PENDING   Blood culture (routine x 2)     Status: None (Preliminary result)   Collection Time: 10/15/14  3:05 PM  Result Value Ref Range   Specimen Description BLOOD RIGHT AC    Special Requests BOTTLES DRAWN AEROBIC AND ANAEROBIC 5CC EACH    Culture             BLOOD CULTURE RECEIVED NO GROWTH TO DATE CULTURE WILL BE HELD FOR 5 DAYS BEFORE ISSUING A FINAL NEGATIVE REPORT Performed at  [facility]    Report Status PENDING   I-Stat CG4 Lactic Acid, ED     Status: None   Collection Time: 10/15/14  3:11 PM  Result Value Ref Range   Lactic Acid, Venous 1.26 0.5 - 2.0 mmol/L  Occult blood card to lab, stool     Status: None   Collection Time: 10/15/14  4:05 PM  Result Value Ref Range   Fecal Occult Bld NEGATIVE NEGATIVE  Comprehensive metabolic panel     Status: Abnormal   Collection Time: 10/16/14  4:50 AM  Result Value Ref Range   Sodium 135 135 - 145 mmol/L   Potassium 3.6 3.5 - 5.1 mmol/L   Chloride 100 96 - 112 mmol/L   CO2 27 19 - 32 mmol/L   Glucose, Bld 132 (H) 70 - 99 mg/dL   BUN 6 6 - 23 mg/dL   Creatinine, Ser 0.59 0.50 - 1.10 mg/dL   Calcium 8.0 (L) 8.4 - 10.5 mg/dL   Total Protein 5.4 (L) 6.0 - 8.3 g/dL   Albumin 3.3 (L) 3.5 - 5.2 g/dL   AST 310 (H) 0 - 37 U/L   ALT 158 (H) 0 - 35 U/L   Alkaline Phosphatase 147 (H) 39 - 117 U/L   Total Bilirubin 0.9  0.3 - 1.2 mg/dL   GFR calc non Af Amer 82 (L) >90 mL/min   GFR calc Af Amer >90 >90 mL/min    Comment: (NOTE) The eGFR has been calculated using the CKD EPI equation. This calculation has not been validated in all clinical situations. eGFR's persistently <90 mL/min signify possible Chronic Kidney Disease.    Anion gap 8 5 - 15  CBC with Differential/Platelet     Status: Abnormal   Collection Time: 10/16/14  4:50 AM  Result Value Ref Range   WBC 20.5 (H) 4.0 - 10.5 K/uL   RBC 4.61 3.87 - 5.11 MIL/uL   Hemoglobin 10.8 (L) 12.0 - 15.0 g/dL   HCT 33.9 (L) 36.0 - 46.0 %   MCV 73.5 (L) 78.0 - 100.0 fL   MCH 23.4 (L) 26.0 - 34.0 pg   MCHC 31.9 30.0 - 36.0 g/dL   RDW 14.0 11.5 - 15.5 %   Platelets 82 (L) 150 - 400 K/uL    Comment: SPECIMEN CHECKED FOR CLOTS REPEATED TO VERIFY PLATELET COUNT CONFIRMED BY SMEAR    Neutrophils Relative % 12 (L) 43 - 77 %   Lymphocytes Relative 85 (H) 12 - 46 %   Monocytes Relative 3 3 - 12 %   Eosinophils Relative 0 0 - 5 %   Basophils Relative 0 0 - 1 %    Neutro Abs 2.5 1.7 - 7.7 K/uL   Lymphs Abs 17.4 (H) 0.7 - 4.0 K/uL   Monocytes Absolute 0.6 0.1 - 1.0 K/uL   Eosinophils Absolute 0.0 0.0 - 0.7 K/uL   Basophils Absolute 0.0 0.0 - 0.1 K/uL   WBC Morphology ABSOLUTE LYMPHOCYTOSIS     Comment: ATYPICAL LYMPHOCYTES  Glucose, capillary     Status: Abnormal   Collection Time: 10/16/14  7:16 AM  Result Value Ref Range   Glucose-Capillary 156 (H) 70 - 99 mg/dL    Ct Abdomen Pelvis Wo Contrast  10/15/2014   CLINICAL DATA:  Lower abdominal pain for 10-15 days.  EXAM: CT ABDOMEN AND PELVIS WITHOUT CONTRAST  TECHNIQUE: Multidetector CT imaging of the abdomen and pelvis was performed following the standard protocol without IV contrast.  COMPARISON:  Abdominal ultrasound 07/17/2014. CT abdomen and pelvis 06/08/2014.  FINDINGS: Mild scarring is noted in the lung bases, most notably in the right middle lobe and similar to the prior CT.  The spleen is again noted to be mildly enlarged. Gallstones demonstrated on the prior ultrasound are not clearly seen by CT. The liver, adrenal glands, left kidney, and pancreas have an unremarkable unenhanced appearance. 10 mm right renal cyst is unchanged.  Oral contrast is present in loops of nondilated small and large bowel to the level of the proximal transverse colon without evidence of obstruction. There is some soft tissue stranding/thickening along the sigmoid colon with suggestion of a degree of sigmoid colonic wall thickening, although evaluation is partially limited by underdistention. Between the sigmoid colon and bladder is a 2.4 x 1.7 cm low-density focus with a locule of gas suspicious for a small fluid collection. There is asymmetric, smooth bladder wall thickening in this region likely secondary to adjacent inflammation. Left-sided colonic diverticulosis is again seen. Appendix is unremarkable.  Calcified uterine fibroids are again seen. Mild atherosclerotic vascular calcification is noted. No free fluid or enlarged  lymph nodes are identified. Chronic L1 compression fracture is again noted as well as chronic fusion across the L5-S1 disc space. SI joint degenerative changes are also noted as well as lower  lumbar facet arthrosis.  IMPRESSION: Colonic diverticulosis with evidence of mild inflammatory changes about the sigmoid colon most likely reflecting diverticulitis with a likely 2.4 cm contained perforation/small abscess between the sigmoid colon and bladder.   Electronically Signed   By: Logan Bores   On: 10/15/2014 21:06    Review of Systems  Constitutional: Positive for fever (TM 99.9). Negative for chills, weight loss, malaise/fatigue and diaphoresis.  HENT: Negative.   Eyes: Negative.   Respiratory: Negative.   Cardiovascular: Negative.   Gastrointestinal: Positive for abdominal pain, diarrhea (multiple soft stools up to 10 per day.) and blood in stool (hs of hemorrhoids). Negative for constipation.       She has felt bad for about 3 months and has had herself on full liquids, and some soft foods for this time. Pain/discomfort is in the LLQ, going to midline.    Genitourinary: Positive for frequency (up to 10 times per night).  Musculoskeletal: Positive for back pain (lumbar fracture earlier this year.  she has not been back to baseline since.).  Skin: Negative.   Neurological: Negative.  Negative for weakness.  Endo/Heme/Allergies: Bruises/bleeds easily.  Psychiatric/Behavioral: Negative.    Blood pressure 113/94, pulse 111, temperature 98.9 F (37.2 C), temperature source Oral, resp. rate 20, height _0  (1.422 m), weight 56.7 kg (125 lb), SpO2 97 %. Physical Exam  Constitutional: She is oriented to person, place, and time. She appears well-developed and well-nourished. No distress.  HENT:  Head: Normocephalic and atraumatic.  Nose: Nose normal.  Eyes: Conjunctivae and EOM are normal. Pupils are equal, round, and reactive to light. Right eye exhibits no discharge. Left eye exhibits no  discharge. No scleral icterus.  Neck: Normal range of motion. Neck supple. No JVD present. No tracheal deviation present. No thyromegaly present.  Cardiovascular: Normal rate, regular rhythm, normal heart sounds and intact distal pulses.   No murmur heard. Respiratory: Effort normal and breath sounds normal. No respiratory distress. She has no wheezes. She has no rales. She exhibits no tenderness.  GI: Soft. Bowel sounds are normal. She exhibits no distension and no mass. There is tenderness (LLQ to midline, most tender now in the LLQ). There is no rebound and no guarding.  Musculoskeletal: Normal range of motion. She exhibits no edema or tenderness.  Chronic issue with knees both legs  Lymphadenopathy:    She has no cervical adenopathy.  Neurological: She is alert and oriented to person, place, and time. No cranial nerve deficit.  Skin: Skin is warm and dry. No rash noted. She is not diaphoretic. No erythema. No pallor.  Psychiatric: She has a normal mood and affect. Her behavior is normal. Judgment and thought content normal.    Assessment/Plan: Sigmoid diverticulitis with 2.4 cm abscess between bladder and sigmoid colon CLL Recent lumbar fracture Arthritis Hx of anemia   Plan:  I have ask IR to look at CT and see if it is something they can drain, continue antibiotics, ice chips, with bowel rest.  Her WBC has improved with just one round of antibiotics.  I would hope she can be treated with antibiotics only.  Stefanie Henry 10/16/2014, 9:43 AM

## 2014-10-16 NOTE — Progress Notes (Signed)
[facility] PROGRESS NOTE  Stefanie Henry DVV:[id number] DOB: [birth year] DOA: 10/15/2014 PCP: [name], MD  Assessment/Plan: 1. Sigmoid diverticulitis with perforation/abscess -Patient presenting with a 2 week history of progressively worsening left lower quadrant abdominal pain, with CT imaging showing sigmoid diverticulitis with contained perforation with abscess. -Gen. surgery consulted, patient was evaluated by Dr. Redmond Pulling. -Patient currently being managed conservatively with IV fluids, nothing by mouth status for bowel rest, and empiric IV antimicrobial therapy with Zosyn. -Lactic acid of 1.26 -Follow-up on a.m. KUB, otherwise to my assessment patient was nontoxic-appearing, abdomen was soft without peritoneal signs evident.  2.  Hyponatremia. -Like he secondary to hypotonic hypovolemic hyponatremia  -Improved after the administration of IV fluids, sodium increasing to 135 from 128 on admission.  3.  Elevated transaminases. -Patient's liver enzymes increasing with AST of 310 ALT of 158 on a.m. lab work.  -CT scan of abdomen and pelvis done overnight showed unremarkable liver -Clinically she remained stable, blood pressures stable.  -Repeat CMP in a.m.  4.  History of CLL -Patient on chronic steroid therapy, continuous centimeter of 20 mg IV daily ' 5.  Shortness of breath -Patient reporting having some shortness of breath, on physical exam lungs were clear to auscultation, will further workup with a chest x-ray.  Code Status: Full code Family Communication: I spoke with her son over telephone conversation Disposition Plan: Continue conservative management, IV fluids, IV antibiotic therapy   Consultants:  General surgery   Antibiotics:  Zosyn  HPI/Subjective: Patient is a pleasant 81 year old female with a past medical history OCL, admitted on 10/16/2014 presented with complaints of abdominal pain, coming progressively worse over the past 2 weeks. Patient  located her pain to the left lower quadrant. Workup occluded a CT scan of abdomen and pelvis that showed chronic diverticulosis with evidence of mild inflammatory changes as well as a 2.4 cm contained perforation/small abscess between the sigmoid colon and bladder. General surgery was consulted as patient was evaluated by Dr. Redmond Pulling. He was made nothing by mouth, given IV fluids and started on IV antibiotic therapy with Zosyn.  Objective: Filed Vitals:   10/16/14 1355  BP: 106/57  Pulse: 85  Temp: 98.1 F (36.7 C)  Resp: 15    Intake/Output Summary (Last 24 hours) at 10/16/14 1453 Last data filed at 10/16/14 1330  Gross per 24 hour  Intake 266.25 ml  Output   1025 ml  Net -758.75 ml   Filed Weights   10/15/14 1345 10/16/14 0015  Weight: 56.7 kg (125 lb) 56.7 kg (125 lb)    Exam:   General:  Patient is resting comfortably she does not appear to be in acute distress, nontoxic appearing, watching television.  Cardiovascular: Regular rate rhythm normal S1-S2  Respiratory: Lungs are clear to auscultation bilaterally  Abdomen: Abdomen is soft, having mild left lower quadrant tenderness to palpation, no rebound tenderness or guarding or peritoneal signs.  Musculoskeletal: No edema  Data Reviewed: Basic Metabolic Panel:  Recent Labs Lab 10/15/14 1500 10/16/14 0450  NA 128* 135  K 4.0 3.6  CL 94* 100  CO2 26 27  GLUCOSE 184* 132*  BUN 10 6  CREATININE 0.60 0.59  CALCIUM 8.1* 8.0*   Liver Function Tests:  Recent Labs Lab 10/15/14 1500 10/16/14 0450  AST 18 310*  ALT 11 158*  ALKPHOS 70 147*  BILITOT 0.5 0.9  PROT 5.9* 5.4*  ALBUMIN 3.5 3.3*    Recent Labs Lab 10/15/14 1500  LIPASE 30   No results  for input(s): AMMONIA in the last 168 hours. CBC:  Recent Labs Lab 10/15/14 1500 10/16/14 0450  WBC 32.1* 20.5*  NEUTROABS 3.2 2.5  HGB 10.8* 10.8*  HCT 34.0* 33.9*  MCV 71.6* 73.5*  PLT 122* 82*   Cardiac Enzymes: No results for input(s): CKTOTAL,  CKMB, CKMBINDEX, TROPONINI in the last 168 hours. BNP (last 3 results) No results for input(s): BNP in the last 8760 hours.  ProBNP (last 3 results)  Recent Labs  10/05/14 1142  PROBNP 81.0    CBG:  Recent Labs Lab 10/16/14 0716  GLUCAP 156*    Recent Results (from the past 240 hour(s))  Blood culture (routine x 2)     Status: None (Preliminary result)   Collection Time: 10/15/14  3:05 PM  Result Value Ref Range Status   Specimen Description BLOOD RIGHT HAND  Final   Special Requests BOTTLES DRAWN AEROBIC AND ANAEROBIC 5CC EACH  Final   Culture   Final           BLOOD CULTURE RECEIVED NO GROWTH TO DATE CULTURE WILL BE HELD FOR 5 DAYS BEFORE ISSUING A FINAL NEGATIVE REPORT Performed at [facility]    Report Status PENDING  Incomplete  Blood culture (routine x 2)     Status: None (Preliminary result)   Collection Time: 10/15/14  3:05 PM  Result Value Ref Range Status   Specimen Description BLOOD RIGHT AC  Final   Special Requests BOTTLES DRAWN AEROBIC AND ANAEROBIC 5CC EACH  Final   Culture   Final           BLOOD CULTURE RECEIVED NO GROWTH TO DATE CULTURE WILL BE HELD FOR 5 DAYS BEFORE ISSUING A FINAL NEGATIVE REPORT Performed at [facility]    Report Status PENDING  Incomplete     Studies: Ct Abdomen Pelvis Wo Contrast  10/15/2014   CLINICAL DATA:  Lower abdominal pain for 10-15 days.  EXAM: CT ABDOMEN AND PELVIS WITHOUT CONTRAST  TECHNIQUE: Multidetector CT imaging of the abdomen and pelvis was performed following the standard protocol without IV contrast.  COMPARISON:  Abdominal ultrasound 07/17/2014. CT abdomen and pelvis 06/08/2014.  FINDINGS: Mild scarring is noted in the lung bases, most notably in the right middle lobe and similar to the prior CT.  The spleen is again noted to be mildly enlarged. Gallstones demonstrated on the prior ultrasound are not clearly seen by CT. The liver, adrenal glands, left kidney, and pancreas have an unremarkable  unenhanced appearance. 10 mm right renal cyst is unchanged.  Oral contrast is present in loops of nondilated small and large bowel to the level of the proximal transverse colon without evidence of obstruction. There is some soft tissue stranding/thickening along the sigmoid colon with suggestion of a degree of sigmoid colonic wall thickening, although evaluation is partially limited by underdistention. Between the sigmoid colon and bladder is a 2.4 x 1.7 cm low-density focus with a locule of gas suspicious for a small fluid collection. There is asymmetric, smooth bladder wall thickening in this region likely secondary to adjacent inflammation. Left-sided colonic diverticulosis is again seen. Appendix is unremarkable.  Calcified uterine fibroids are again seen. Mild atherosclerotic vascular calcification is noted. No free fluid or enlarged lymph nodes are identified. Chronic L1 compression fracture is again noted as well as chronic fusion across the L5-S1 disc space. SI joint degenerative changes are also noted as well as lower lumbar facet arthrosis.  IMPRESSION: Colonic diverticulosis with evidence of mild inflammatory changes about  the sigmoid colon most likely reflecting diverticulitis with a likely 2.4 cm contained perforation/small abscess between the sigmoid colon and bladder.   Electronically Signed   By: Logan Bores   On: 10/15/2014 21:06    Scheduled Meds: . methylPREDNISolone (SOLU-MEDROL) injection  20 mg Intravenous Daily  . piperacillin-tazobactam (ZOSYN)  IV  3.375 g Intravenous 3 times per day   Continuous Infusions: . dextrose 5 % and 0.9% NaCl 75 mL/hr (10/16/14 1358)    Active Problems:   Chronic lymphocytic leukemia   Diverticulitis   Hyponatremia   Diverticulitis of large intestine with perforation and abscess without bleeding   Thrombocytopenia    Time spent:     Kelvin Cellar  [facility] Pager [phone]. If 7PM-7AM, please contact night-coverage at  [email or web address], password [facility] 10/16/2014, 2:53 PM  LOS: 1 day

## 2014-10-16 NOTE — Progress Notes (Signed)
ANTIBIOTIC CONSULT NOTE - INITIAL  Pharmacy Consult for zosyn Indication: Intra-abdominal infection  Allergies  Allergen Reactions  . Contrast Media [Iodinated Diagnostic Agents] Other (See Comments)    Stomach discomfort  . Fruit & Vegetable Daily [Nutritional Supplements] Other (See Comments)    Citrus fruit causes stomach discomfort  . Ibuprofen Other (See Comments)    Stomach discomfort    Patient Measurements: Height: 4\' 8"  (142.2 cm) Weight: 125 lb (56.7 kg) IBW/kg (Calculated) : 36.3 Adjusted Body Weight:   Vital Signs: Temp: 98.9 F (37.2 C) (04/19 0500) Temp Source: Oral (04/19 0500) BP: 113/94 mmHg (04/19 0500) Pulse Rate: 111 (04/19 0500) Intake/Output from previous day: 04/18 0701 - 04/19 0700 In: 266.3 [I.V.:216.3; IV Piggyback:50] Out: 325 [Urine:325] Intake/Output from this shift: Total I/O In: 266.3 [I.V.:216.3; IV Piggyback:50] Out: 325 [Urine:325]  Labs:  Recent Labs  10/15/14 1500  WBC 32.1*  HGB 10.8*  PLT 122*  CREATININE 0.60   Estimated Creatinine Clearance: 37.4 mL/min (by C-G formula based on Cr of 0.6). No results for input(s): VANCOTROUGH, VANCOPEAK, VANCORANDOM, GENTTROUGH, GENTPEAK, GENTRANDOM, TOBRATROUGH, TOBRAPEAK, TOBRARND, AMIKACINPEAK, AMIKACINTROU, AMIKACIN in the last 72 hours.   Microbiology: No results found for this or any previous visit (from the past 720 hour(s)).  Medical History: Past Medical History  Diagnosis Date  . Asthma   . Arthritis   . Shingles   . Anemia, iron deficiency 06/06/2012  . Chronic lymphatic leukemia   . Gallstones     Medications:  Anti-infectives    Start     Dose/Rate Route Frequency Ordered Stop   10/16/14 0245  piperacillin-tazobactam (ZOSYN) IVPB 3.375 g     3.375 g 12.5 mL/hr over 240 Minutes Intravenous 3 times per day 10/16/14 0238     10/15/14 2145  ciprofloxacin (CIPRO) IVPB 400 mg     400 mg 200 mL/hr over 60 Minutes Intravenous  Once 10/15/14 2137 10/15/14 2338   10/15/14 2145  metroNIDAZOLE (FLAGYL) IVPB 500 mg     500 mg 100 mL/hr over 60 Minutes Intravenous  Once 10/15/14 2137 10/16/14 0018     Assessment: Patient with intra-abdominal infection, hx of CLL and asthma.    Goal of Therapy:  Zosyn based on renal function   Plan:  Follow up culture results  Zosyn 3.375g IV Q8H infused over 4hrs.   Tyler Deis, Shea Stakes [place] 10/16/2014,5:11 AM

## 2014-10-16 NOTE — Progress Notes (Signed)
New admission arrived from [place]. [facility] Admitting paged and notified of patient's arrival at [place].  Patient cont to c/o pain. Paged again at 0100.  Informed that provider on his way to bedside.  Will cont to monitor. Petra Kuba RN

## 2014-10-16 NOTE — H&P (Addendum)
[facility] History and Physical  Kadisha Goodine OIZ:[id number] DOB: Oct 10, 1929 DOA: 10/15/2014  Referring physician: Patient was transferred from [facility] [place]. By Ms. Baird Cancer PA. PCP: Fara Olden, RUPASHREE in [place]. Oncologist - Dr. [name].  Chief Complaint: Abdominal pain.  HPI: Stefanie Henry is a 81 y.o. female with history of CLL, asthma presents to the ER because of worsening abdominal pain. Patient states over the last 15 days patient has been abdominal pain mostly in left lower quadrant. Patient denies any associated nausea vomiting or diarrhea. Has had some fever chills or week ago. Since the pain was worsening patient came to the ER. CT of the abdomen and pelvis shows left lower quadrant sigmoid diverticulitis with contained perforation and abscess. On-call surgeon Dr. Hassell Done was notified and at this time patient has been admitted for further management. Patient states the pain happens episodically when becomes severe at times. Denies any shortness of breath or chest pain.   Review of Systems: As presented in the history of presenting illness, rest negative.  Past Medical History  Diagnosis Date  . Asthma   . Arthritis   . Shingles   . Anemia, iron deficiency 06/06/2012  . Chronic lymphatic leukemia   . Gallstones    History reviewed. No pertinent past surgical history. Social History:  reports that she has never smoked. She has never used smokeless tobacco. She reports that she does not drink alcohol or use illicit drugs. Where does patient live home. Can patient participate in ADLs? Yes.  Allergies  Allergen Reactions  . Contrast Media [Iodinated Diagnostic Agents] Other (See Comments)    Stomach discomfort  . Fruit & Vegetable Daily [Nutritional Supplements] Other (See Comments)    Citrus fruit causes stomach discomfort  . Ibuprofen Other (See Comments)    Stomach discomfort    Family History:  Family History  Problem Relation Age of Onset  .  Diabetes Father       Prior to Admission medications   Medication Sig Start Date End Date Taking? Authorizing Provider  albuterol (PROVENTIL HFA;VENTOLIN HFA) 108 (90 BASE) MCG/ACT inhaler Inhale 1-2 puffs into the lungs every 6 (six) hours as needed for wheezing or shortness of breath.   Yes Historical Provider, MD  calcitonin, salmon, (MIACALCIN/FORTICAL) 200 UNIT/ACT nasal spray Place 1 spray into alternate nostrils daily. 06/15/14  Yes Daniel J Angiulli, PA-C  famotidine (PEPCID) 20 MG tablet Take 1 tablet (20 mg total) by mouth daily. 06/15/14  Yes Daniel J Angiulli, PA-C  gabapentin (NEURONTIN) 100 MG capsule Take 1 capsule (100 mg total) by mouth daily. 06/15/14  Yes Daniel J Angiulli, PA-C  predniSONE (DELTASONE) 10 MG tablet TAKE 1 TABLET BY MOUTH ONCE DAILY 10/07/14  Yes [name], MD  Simethicone (GAS-X PO) Take 2 capsules by mouth daily.    Yes Historical Provider, MD  pantoprazole (PROTONIX) 40 MG tablet Take 1 tablet (40 mg total) by mouth daily. Take 30-60 min before first meal of the day Patient not taking: Reported on 10/16/2014 10/05/14   Tanda Rockers, MD  predniSONE (DELTASONE) 10 MG tablet TAKE 1 TABLET BY MOUTH ONCE DAILY 10/03/14   [name], MD    Physical Exam: Filed Vitals:   10/15/14 2055 10/15/14 2146 10/15/14 2324 10/16/14 0015  BP:  124/55 119/61 112/58  Pulse: 86 85 81 85  Temp:   98.6 F (37 C) 98.1 F (36.7 C)  TempSrc:   Oral Oral  Resp: 17 18 20 20   Height:  4\' 8"  (1.422 m)  Weight:    56.7 kg (125 lb)  SpO2: 96% 100% 96% 96%     General:  Well-developed and nourished.  Eyes: Anicteric no pallor.  ENT: No discharge from the ears eyes nose and mouth.  Neck: No mass felt.  Cardiovascular: S1-S2 heard.  Respiratory: No rhonchi or crepitations.  Abdomen: Tenderness in the left lower quadrant no guarding or rigidity. Bowel sounds appreciated.  Skin: No rash.  Musculoskeletal: No edema.  Psychiatric: Appears  normal.  Neurologic: Alert awake oriented to time place and person. Moves all extremity sprain  Labs on Admission:  Basic Metabolic Panel:  Recent Labs Lab 10/15/14 1500  NA 128*  K 4.0  CL 94*  CO2 26  GLUCOSE 184*  BUN 10  CREATININE 0.60  CALCIUM 8.1*   Liver Function Tests:  Recent Labs Lab 10/15/14 1500  AST 18  ALT 11  ALKPHOS 70  BILITOT 0.5  PROT 5.9*  ALBUMIN 3.5    Recent Labs Lab 10/15/14 1500  LIPASE 30   No results for input(s): AMMONIA in the last 168 hours. CBC:  Recent Labs Lab 10/15/14 1500  WBC 32.1*  NEUTROABS 3.2  HGB 10.8*  HCT 34.0*  MCV 71.6*  PLT 122*   Cardiac Enzymes: No results for input(s): CKTOTAL, CKMB, CKMBINDEX, TROPONINI in the last 168 hours.  BNP (last 3 results) No results for input(s): BNP in the last 8760 hours.  ProBNP (last 3 results)  Recent Labs  10/05/14 1142  PROBNP 81.0    CBG: No results for input(s): GLUCAP in the last 168 hours.  Radiological Exams on Admission: Ct Abdomen Pelvis Wo Contrast  10/15/2014   CLINICAL DATA:  Lower abdominal pain for 10-15 days.  EXAM: CT ABDOMEN AND PELVIS WITHOUT CONTRAST  TECHNIQUE: Multidetector CT imaging of the abdomen and pelvis was performed following the standard protocol without IV contrast.  COMPARISON:  Abdominal ultrasound 07/17/2014. CT abdomen and pelvis 06/08/2014.  FINDINGS: Mild scarring is noted in the lung bases, most notably in the right middle lobe and similar to the prior CT.  The spleen is again noted to be mildly enlarged. Gallstones demonstrated on the prior ultrasound are not clearly seen by CT. The liver, adrenal glands, left kidney, and pancreas have an unremarkable unenhanced appearance. 10 mm right renal cyst is unchanged.  Oral contrast is present in loops of nondilated small and large bowel to the level of the proximal transverse colon without evidence of obstruction. There is some soft tissue stranding/thickening along the sigmoid colon  with suggestion of a degree of sigmoid colonic wall thickening, although evaluation is partially limited by underdistention. Between the sigmoid colon and bladder is a 2.4 x 1.7 cm low-density focus with a locule of gas suspicious for a small fluid collection. There is asymmetric, smooth bladder wall thickening in this region likely secondary to adjacent inflammation. Left-sided colonic diverticulosis is again seen. Appendix is unremarkable.  Calcified uterine fibroids are again seen. Mild atherosclerotic vascular calcification is noted. No free fluid or enlarged lymph nodes are identified. Chronic L1 compression fracture is again noted as well as chronic fusion across the L5-S1 disc space. SI joint degenerative changes are also noted as well as lower lumbar facet arthrosis.  IMPRESSION: Colonic diverticulosis with evidence of mild inflammatory changes about the sigmoid colon most likely reflecting diverticulitis with a likely 2.4 cm contained perforation/small abscess between the sigmoid colon and bladder.   Electronically Signed   By: Logan Bores  On: 10/15/2014 21:06     Assessment/Plan Active Problems:   Chronic lymphocytic leukemia   Diverticulitis   Hyponatremia   Diverticulitis of large intestine with perforation and abscess without bleeding   Thrombocytopenia   1. Sigmoid diverticulitis with contained perforation with abscess - Dr. Hassell Done on call surgeon is aware and will be seeing patient in consultation. At this time I have placed patient on IV Zosyn. Patient has been kept nothing by mouth and I have placed patient on IV fluids and pain relief medications. I have ordered KUB for AM. Further recommendations per surgery. 2. History of CLL - patient is on chronic prednisone for the same. Since patient is nothing by mouth I have placed patient on IV Solu-Medrol. Closely follow CBC count. May discuss with patient's oncologist Dr. Burney Gauze in a.m. 3. History of asthma presently not wheezing  - continue with as needed inhaler. 4. Hyponatremia probably from dehydration - patient is on IV fluids and closely follow metabolic panel.  Addendum - I have discussed with Dr. Hassell Done on call surgeon number patient's admission. Dr. Hassell Done will be seeing patient in consult.   DVT Prophylaxis SCDs in anticipation of possible procedure.  Code Status: Full code.  Family Communication: Patient's son at the bedside.  Disposition Plan: Admit to inpatient. Likely stay would be 3-4 days.    Isair Inabinet N. [facility] Pager [phone].  If 7PM-7AM, please contact night-coverage [email or web address] Password [email or web address] 10/16/2014, 1:47 AM

## 2014-10-16 NOTE — Plan of Care (Signed)
Problem: Consults Goal: General Medical Patient Education See Patient Education Module for specific education. Outcome: Progressing Diverticulitis

## 2014-10-17 DIAGNOSIS — C9510 Chronic leukemia of unspecified cell type not having achieved remission: Secondary | ICD-10-CM

## 2014-10-17 DIAGNOSIS — D509 Iron deficiency anemia, unspecified: Secondary | ICD-10-CM

## 2014-10-17 LAB — COMPREHENSIVE METABOLIC PANEL
ALBUMIN: 3.1 g/dL — AB (ref 3.5–5.2)
ALK PHOS: 145 U/L — AB (ref 39–117)
ALT: 162 U/L — AB (ref 0–35)
AST: 76 U/L — ABNORMAL HIGH (ref 0–37)
Anion gap: 7 (ref 5–15)
BILIRUBIN TOTAL: 0.3 mg/dL (ref 0.3–1.2)
BUN: 8 mg/dL (ref 6–23)
CO2: 25 mmol/L (ref 19–32)
Calcium: 8.1 mg/dL — ABNORMAL LOW (ref 8.4–10.5)
Chloride: 106 mmol/L (ref 96–112)
Creatinine, Ser: 0.59 mg/dL (ref 0.50–1.10)
GFR calc Af Amer: >90 mL/min (ref >90)
GFR calc non Af Amer: 82 mL/min — ABNORMAL LOW (ref >90)
Glucose, Bld: 133 mg/dL — ABNORMAL HIGH (ref 70–99)
Potassium: 3.2 mmol/L — ABNORMAL LOW (ref 3.5–5.1)
Sodium: 138 mmol/L (ref 135–145)
TOTAL PROTEIN: 5.2 g/dL — AB (ref 6.0–8.3)

## 2014-10-17 LAB — CBC
HCT: 32.3 % — ABNORMAL LOW (ref 36.0–46.0)
Hemoglobin: 10.1 g/dL — ABNORMAL LOW (ref 12.0–15.0)
MCH: 22.9 pg — AB (ref 26.0–34.0)
MCHC: 31.3 g/dL (ref 30.0–36.0)
MCV: 73.2 fL — ABNORMAL LOW (ref 78.0–100.0)
PLATELETS: 104 10*3/uL — AB (ref 150–400)
RBC: 4.41 MIL/uL (ref 3.87–5.11)
RDW: 14 % (ref 11.5–15.5)
WBC: 24.3 10*3/uL — ABNORMAL HIGH (ref 4.0–10.5)

## 2014-10-17 LAB — GLUCOSE, CAPILLARY
GLUCOSE-CAPILLARY: 152 mg/dL — AB (ref 70–99)
Glucose-Capillary: 116 mg/dL — ABNORMAL HIGH (ref 70–99)
Glucose-Capillary: 121 mg/dL — ABNORMAL HIGH (ref 70–99)
Glucose-Capillary: 144 mg/dL — ABNORMAL HIGH (ref 70–99)

## 2014-10-17 MED ORDER — OXYCODONE-ACETAMINOPHEN 5-325 MG PO TABS
1.0000 | ORAL_TABLET | ORAL | Status: DC | PRN
  Administered 2014-10-18: 1 via ORAL
  Filled 2014-10-17: qty 1

## 2014-10-17 MED ORDER — FLUCONAZOLE 150 MG PO TABS
150.0000 mg | ORAL_TABLET | Freq: Once | ORAL | Status: DC
  Filled 2014-10-17: qty 1

## 2014-10-17 MED ORDER — ALBUTEROL SULFATE (2.5 MG/3ML) 0.083% IN NEBU
2.5000 mg | INHALATION_SOLUTION | Freq: Two times a day (BID) | RESPIRATORY_TRACT | Status: DC
  Administered 2014-10-17 – 2014-10-20 (×6): 2.5 mg via RESPIRATORY_TRACT
  Filled 2014-10-17 (×6): qty 3

## 2014-10-17 MED ORDER — POTASSIUM CHLORIDE CRYS ER 20 MEQ PO TBCR
40.0000 meq | EXTENDED_RELEASE_TABLET | Freq: Once | ORAL | Status: AC
  Administered 2014-10-17: 40 meq via ORAL
  Filled 2014-10-17: qty 2

## 2014-10-17 MED ORDER — DEXTROSE-NACL 5-0.9 % IV SOLN: INTRAVENOUS | Status: DC

## 2014-10-17 NOTE — Progress Notes (Signed)
PROGRESS NOTE  Arnetta Odeh UXL:[id number] DOB: September 25, 1929 DOA: 10/15/2014 PCP: [name], MD  HPI: Lasheka Kempner is a 81 y.o. female with history of CLL, asthma presents to the ER because of worsening abdominal pain, found to have sigmoid diverticulitis with contained perforation and abscess.  Subjective / 24 H Interval events - pain improved, no nausea/vomiting - no overnight events - denies chest pain or breathing difficulties   Assessment/Plan: Active Problems:   Chronic lymphocytic leukemia   Diverticulitis   Hyponatremia   Diverticulitis of large intestine with perforation and abscess without bleeding   Thrombocytopenia   Sigmoid diverticulitis with perforation/abscess - Patient presenting with a 2 week history of progressively worsening left lower quadrant abdominal pain - CT imaging showing sigmoid diverticulitis with contained perforation with abscess. - Gen. surgery consulted, discussed with surgical team today, continue IV antibiotics and start clears  Hyponatremia. - Like he secondary to hypotonic hypovolemic hyponatremia  - Improved after the administration of IV fluids  Elevated transaminases. - Patient's liver enzymes increasing with AST of 310 ALT of 158 on a.m. lab work.  - CT scan of abdomen and pelvis done overnight showed unremarkable liver, LFTs improving - Clinically she remained stable, blood pressures stable.   History of CLL - Patient on chronic steroid therapy - oncology following  Leukocytosis, anemia, thrombocytopenia  - in the setting of CLL - monitor with daily CBC while hospitalized.   Shortness of breath, improved - CXR yesterday reviewed and showed Mildly increased central pulmonary vascular congestion  - discontinue fluids, allow po intake, she is on room air. May need Lasix if she needs O2   Diet: Diet clear liquid Room service appropriate?: Yes; Fluid consistency:: Thin Fluids: none DVT Prophylaxis: SCD  Code Status:  Full Code Family Communication: discussed with son over phone; he called in the room  Disposition Plan: home when ready   Consultants:  General surgery   Procedures:  None    Antibiotics  Anti-infectives    Start     Dose/Rate Route Frequency Ordered Stop   10/16/14 0245  piperacillin-tazobactam (ZOSYN) IVPB 3.375 g     3.375 g 12.5 mL/hr over 240 Minutes Intravenous 3 times per day 10/16/14 0238     10/15/14 2145  ciprofloxacin (CIPRO) IVPB 400 mg     400 mg 200 mL/hr over 60 Minutes Intravenous  Once 10/15/14 2137 10/15/14 2338   10/15/14 2145  metroNIDAZOLE (FLAGYL) IVPB 500 mg     500 mg 100 mL/hr over 60 Minutes Intravenous  Once 10/15/14 2137 10/16/14 0018       Studies  Ct Abdomen Pelvis Wo Contrast 10/15/2014 Colonic diverticulosis with evidence of mild inflammatory changes about the sigmoid colon most likely reflecting diverticulitis with a likely 2.4 cm contained perforation/small abscess between the sigmoid colon and bladder.   Electronically Signed   By: Logan Bores   On: 10/15/2014 21:06   Dg Chest 1 View 10/16/2014 Mildly increased central pulmonary vascular congestion is noted with possible minimal bilateral perihilar edema.   Electronically Signed   By: Marijo Conception, M.D.   On: 10/16/2014 15:48    Objective  Filed Vitals:   10/16/14 1355 10/16/14 2128 10/17/14 0525 10/17/14 1445  BP: 106/57 115/59 113/54 119/63  Pulse: 85 74 66 81  Temp: 98.1 F (36.7 C) 98 F (36.7 C) 98 F (36.7 C) 98.4 F (36.9 C)  TempSrc: Oral Oral Oral Oral  Resp: 15 16 16 16   Height:  Weight:      SpO2: 96% 95% 98% 98%    Intake/Output Summary (Last 24 hours) at 10/17/14 1515 Last data filed at 10/17/14 1024  Gross per 24 hour  Intake    170 ml  Output    850 ml  Net   -680 ml   Filed Weights   10/15/14 1345 10/16/14 0015  Weight: 56.7 kg (125 lb) 56.7 kg (125 lb)    Exam:  General:  NAD  HEENT: no scleral icterus, PERRL  Cardiovascular: RRR without  MRG, 2+ peripheral pulses, no edema  Respiratory: CTA biL, good air movement, no wheezing, no crackles  Abdomen: soft, non tender, BS +, no guarding  MSK/Extremities: no clubbing/cyanosis, no joint swelling  Skin: no rashes  Neuro: non focal, strength 5/5 in all 4   Data Reviewed: Basic Metabolic Panel:  Recent Labs Lab 10/15/14 1500 10/16/14 0450 10/17/14 0510  NA 128* 135 138  K 4.0 3.6 3.2*  CL 94* 100 106  CO2 26 27 25   GLUCOSE 184* 132* 133*  BUN 10 6 8   CREATININE 0.60 0.59 0.59  CALCIUM 8.1* 8.0* 8.1*   Liver Function Tests:  Recent Labs Lab 10/15/14 1500 10/16/14 0450 10/17/14 0510  AST 18 310* 76*  ALT 11 158* 162*  ALKPHOS 70 147* 145*  BILITOT 0.5 0.9 0.3  PROT 5.9* 5.4* 5.2*  ALBUMIN 3.5 3.3* 3.1*    Recent Labs Lab 10/15/14 1500  LIPASE 30   CBC:  Recent Labs Lab 10/15/14 1500 10/16/14 0450 10/17/14 0510  WBC 32.1* 20.5* 24.3*  NEUTROABS 3.2 2.5  --   HGB 10.8* 10.8* 10.1*  HCT 34.0* 33.9* 32.3*  MCV 71.6* 73.5* 73.2*  PLT 122* 82* 104*   ProBNP (last 3 results)  Recent Labs  10/05/14 1142  PROBNP 81.0    CBG:  Recent Labs Lab 10/16/14 1526 10/16/14 1801 10/17/14 0030 10/17/14 0522 10/17/14 1208  GLUCAP 164* 160* 116* 121* 152*    Recent Results (from the past 240 hour(s))  Blood culture (routine x 2)     Status: None (Preliminary result)   Collection Time: 10/15/14  3:05 PM  Result Value Ref Range Status   Specimen Description BLOOD RIGHT HAND  Final   Special Requests BOTTLES DRAWN AEROBIC AND ANAEROBIC 5CC EACH  Final   Culture   Final           BLOOD CULTURE RECEIVED NO GROWTH TO DATE CULTURE WILL BE HELD FOR 5 DAYS BEFORE ISSUING A FINAL NEGATIVE REPORT Performed at [facility]    Report Status PENDING  Incomplete  Blood culture (routine x 2)     Status: None (Preliminary result)   Collection Time: 10/15/14  3:05 PM  Result Value Ref Range Status   Specimen Description BLOOD RIGHT AC  Final    Special Requests BOTTLES DRAWN AEROBIC AND ANAEROBIC 5CC EACH  Final   Culture   Final           BLOOD CULTURE RECEIVED NO GROWTH TO DATE CULTURE WILL BE HELD FOR 5 DAYS BEFORE ISSUING A FINAL NEGATIVE REPORT Performed at [facility]    Report Status PENDING  Incomplete     Scheduled Meds: . methylPREDNISolone (SOLU-MEDROL) injection  20 mg Intravenous Daily  . piperacillin-tazobactam (ZOSYN)  IV  3.375 g Intravenous 3 times per day   Continuous Infusions: . dextrose 5 % and 0.9% NaCl 50 mL/hr (10/17/14 1121)    Marzetta Board, MD [facility] Pager [phone]. If  7 PM - 7 AM, please contact night-coverage at [email or web address], password [facility] 10/17/2014, 3:15 PM  LOS: 2 days

## 2014-10-17 NOTE — Progress Notes (Signed)
10/17/14 Nursing 2020   Dr Baltazar Najjar called reg pt c/o of shortness of breath when up to commode. Patient 97 % ra a this time. Order received for ventolin bid and [id number] if needed at night. Will continue to monitor patient

## 2014-10-17 NOTE — Consult Note (Signed)
[place]  Telephone:([phone]) [phone]   Requesting Provider: [facility]  Consulting Provider: Allen Kell  Primary Oncologist: Kelley  NOTE  Reason for Consultation: CLL   HPI: [name] is an 81 year old woman with a history of CLL, briefly treated with Treanda In 2014, until deciding that she did not want any further treatments. She was last seen at the office in  08/20/2014, at which time she was only interested in symptom management, including  2 doses of Feraheme for her iron deficiency anemia. At the time, her hemoglobin was 11.5, MCV 73, and her white count was 43.8 She was admitted on 10/16/2014 with intermittent abdominal pain, bloating,  For about 3 months, following a recent lumbar fracture. She had decreased appetite, but no significant weight loss. At the time of admission, her abdominal pain was severe. She had some loose stools, non bloody. On admission, she was febrile, tachycardic. CT scan showed colonic diverticulosis with evidence of mild inflammatory changes of the sigmoid colon, likely reflecting diverticulitis, with a likely 2.4 cm contained perforation- small abscess between the sigmoid colon and the bladder. She was placed on IV antibiotics, IV fluids,And being treated conservatively. Her white count is stable, at 24.3, And her hemoglobin is stable too at 10.1.We were kindly informed of the patient's admission.    Past Medical History  Diagnosis Date  . Asthma   . Arthritis   . Shingles   . Anemia, iron deficiency 06/06/2012  . Chronic lymphatic leukemia   . Gallstones      MEDICATIONS:  Scheduled Meds: . methylPREDNISolone (SOLU-MEDROL) injection  20 mg Intravenous Daily  . piperacillin-tazobactam (ZOSYN)  IV  3.375 g Intravenous 3 times per day   Continuous Infusions:  PRN Meds:.acetaminophen **OR** acetaminophen, albuterol, morphine injection, ondansetron **OR** ondansetron (ZOFRAN) IV,  oxyCODONE-acetaminophen  ALLERGIES:  Allergies  Allergen Reactions  . Contrast Media [Iodinated Diagnostic Agents] Other (See Comments)    Stomach discomfort  . Fruit & Vegetable Daily [Nutritional Supplements] Other (See Comments)    Citrus fruit causes stomach discomfort  . Ibuprofen Other (See Comments)    Stomach discomfort    Family History  Problem Relation Age of Onset  . Diabetes Father      History reviewed. No pertinent past surgical history.  History   Social History  . Marital Status: Married    Spouse Name: N/A  . Number of Children: 2  . Years of Education: N/A   Occupational History  . Retired    Social History Main Topics  . Smoking status: Never Smoker   . Smokeless tobacco: Never Used     Comment: PT NEVER SMOKED  . Alcohol Use: No  . Drug Use: No  . Sexual Activity: Not on file   Other Topics Concern  . Not on file   Social History Narrative     PHYSICAL EXAMINATION:  Filed Vitals:   10/17/14 0525  BP: 113/54  Pulse: 66  Temp: 98 F (36.7 C)  Resp: 16      Intake/Output Summary (Last 24 hours) at 10/17/14 0943 Last data filed at 10/17/14 0820  Gross per 24 hour  Intake    110 ml  Output   1275 ml  Net  -1165 ml    GENERAL:alert, no distress and comfortable SKIN: skin color, texture, turgor are normal, no rashes or significant lesions EYES: normal, conjunctiva are pink and non-injected, sclera clear OROPHARYNX:no exudate, no erythema and lips, buccal mucosa, and  tongue normal  NECK: supple, thyroid normal size, non-tender, without nodularity LYMPH:  no palpable lymphadenopathy in the cervical, axillary or inguinal LUNGS: clear to auscultation and percussion with normal breathing effort HEART: regular rate & rhythm and no murmurs and no lower extremity edema ABDOMEN:abdomen soft, tender at the left lower quadrant  and normal bowel sounds Musculoskeletal:no cyanosis of digits and no clubbing  PSYCH: alert & oriented x 3 with  fluent speech NEURO: no focal motor/sensory deficits  LABORATORY/RADIOLOGY DATA:   Recent Labs Lab 10/15/14 1500 10/16/14 0450 10/17/14 0510  WBC 32.1* 20.5* 24.3*  HGB 10.8* 10.8* 10.1*  HCT 34.0* 33.9* 32.3*  PLT 122* 82* 104*  MCV 71.6* 73.5* 73.2*  MCH 22.7* 23.4* 22.9*  MCHC 31.8 31.9 31.3  RDW 13.9 14.0 14.0  LYMPHSABS 28.6* 17.4*  --   MONOABS 0.3 0.6  --   EOSABS 0.0 0.0  --   BASOSABS 0.0 0.0  --     CMP    Recent Labs Lab 10/15/14 1500 10/16/14 0450 10/17/14 0510  NA 128* 135 138  K 4.0 3.6 3.2*  CL 94* 100 106  CO2 26 27 25   GLUCOSE 184* 132* 133*  BUN 10 6 8   CREATININE 0.60 0.59 0.59  CALCIUM 8.1* 8.0* 8.1*  AST 18 310* 76*  ALT 11 158* 162*  ALKPHOS 70 147* 145*  BILITOT 0.5 0.9 0.3        Component Value Date/Time   BILITOT 0.3 10/17/2014 0510   BILITOT 0.80 01/16/2013 0851     Urinalysis    Component Value Date/Time   COLORURINE YELLOW 10/15/2014 1340   APPEARANCEUR CLEAR 10/15/2014 1340   LABSPEC 1.009 10/15/2014 1340   PHURINE 7.0 10/15/2014 1340   GLUCOSEU NEGATIVE 10/15/2014 1340   HGBUR MODERATE* 10/15/2014 1340   BILIRUBINUR NEGATIVE 10/15/2014 1340   KETONESUR NEGATIVE 10/15/2014 1340   PROTEINUR NEGATIVE 10/15/2014 1340   UROBILINOGEN 0.2 10/15/2014 1340   NITRITE NEGATIVE 10/15/2014 1340   LEUKOCYTESUR NEGATIVE 10/15/2014 1340    Drugs of Abuse  No results found for: LABOPIA, COCAINSCRNUR, LABBENZ, AMPHETMU, THCU, LABBARB   Liver Function Tests:  Recent Labs Lab 10/15/14 1500 10/16/14 0450 10/17/14 0510  AST 18 310* 76*  ALT 11 158* 162*  ALKPHOS 70 147* 145*  BILITOT 0.5 0.9 0.3  PROT 5.9* 5.4* 5.2*  ALBUMIN 3.5 3.3* 3.1*    Recent Labs Lab 10/15/14 1500  LIPASE 30   No results for input(s): AMMONIA in the last 168 hours.  CBG:  Recent Labs Lab 10/16/14 0716 10/16/14 1526 10/16/14 1801 10/17/14 0030 10/17/14 0522  GLUCAP 156* 164* 160* 116* 121*    Radiology Studies:  Ct Abdomen  Pelvis Wo Contrast  10/15/2014   CLINICAL DATA:  Lower abdominal pain for 10-15 days.  EXAM: CT ABDOMEN AND PELVIS WITHOUT CONTRAST  TECHNIQUE: Multidetector CT imaging of the abdomen and pelvis was performed following the standard protocol without IV contrast.  COMPARISON:  Abdominal ultrasound 07/17/2014. CT abdomen and pelvis 06/08/2014.  FINDINGS: Mild scarring is noted in the lung bases, most notably in the right middle lobe and similar to the prior CT.  The spleen is again noted to be mildly enlarged. Gallstones demonstrated on the prior ultrasound are not clearly seen by CT. The liver, adrenal glands, left kidney, and pancreas have an unremarkable unenhanced appearance. 10 mm right renal cyst is unchanged.  Oral contrast is present in loops of nondilated small and large bowel to the level of the proximal transverse  colon without evidence of obstruction. There is some soft tissue stranding/thickening along the sigmoid colon with suggestion of a degree of sigmoid colonic wall thickening, although evaluation is partially limited by underdistention. Between the sigmoid colon and bladder is a 2.4 x 1.7 cm low-density focus with a locule of gas suspicious for a small fluid collection. There is asymmetric, smooth bladder wall thickening in this region likely secondary to adjacent inflammation. Left-sided colonic diverticulosis is again seen. Appendix is unremarkable.  Calcified uterine fibroids are again seen. Mild atherosclerotic vascular calcification is noted. No free fluid or enlarged lymph nodes are identified. Chronic L1 compression fracture is again noted as well as chronic fusion across the L5-S1 disc space. SI joint degenerative changes are also noted as well as lower lumbar facet arthrosis.  IMPRESSION: Colonic diverticulosis with evidence of mild inflammatory changes about the sigmoid colon most likely reflecting diverticulitis with a likely 2.4 cm contained perforation/small abscess between the sigmoid  colon and bladder.   Electronically Signed   By: Logan Bores   On: 10/15/2014 21:06   Dg Chest 1 View  10/16/2014   CLINICAL DATA:  Acute shortness of breath.  EXAM: CHEST  1 VIEW  COMPARISON:  October 05, 2014.  FINDINGS: Stable cardiomediastinal silhouette. No pneumothorax or pleural effusion is noted. Mildly increased central pulmonary vascular congestion is noted minimal bilateral perihilar edema may be present. No significant osseous abnormality is noted.  IMPRESSION: Mildly increased central pulmonary vascular congestion is noted with possible minimal bilateral perihilar edema.   Electronically Signed   By: Marijo Conception, M.D.   On: 10/16/2014 15:48   Dg Chest 2 View  10/05/2014   CLINICAL DATA:  Dyspnea for 3 months.  History of asthma.  EXAM: CHEST  2 VIEW  COMPARISON:  12/13/2011  FINDINGS: There is chronic right middle lobe scarring. There is no focal parenchymal opacity, pleural effusion, or pneumothorax. There is stable cardiomegaly.  The osseous structures are unremarkable.  IMPRESSION: No active cardiopulmonary disease.   Electronically Signed   By: Kathreen Devoid   On: 10/05/2014 13:13       ASSESSMENT AND PLAN:  CLL The patient is in no treatment, observation only. Her counts are fairly stable In addition, she is on steroids at this time Continue to monitor, no intervention is indicated at this time  Anemia Iron deficiency Due to recent malnutrition,dilution, infection, antibiotics  No transfusion is indicated at this time Monitor counts closely Transfuse blood to maintain a Hb of 8 g or if the patient is acutely bleeding  Thrombocytopenia This is due to malignancy, dilution, infection Monitor counts closely No transfusion is indicated at this time Transfuse 1 unit of platelets if count is less or equal than 10,000 or 20,000 if the patient is acutely bleeding   Malnutrition Consider Nutrition evaluation  Sigmoid diverticulitis With a 2.4 cm abscess between bladder  and sigmoid colon This is treated conservatively at this time Appreciate surgical follow-up  Full Code   Other medical issues as per admitting team   **Disclaimer: This note was dictated with voice recognition software. Similar sounding words can inadvertently be transcribed and this note may contain transcription errors which may not have been corrected upon publication of note.**  WERTMAN,SARA E, PA-C 10/17/2014, 9:43 AM   ADDENDUM:  I agree with the above. We have not seen Ms. Hartwell for several years. Not sure as to why they think I'm her primary doctor. I think she does have a primary doctor.  We try to treat the CLL. We only gave her one cycle of treatment and she did respond very nicely. That was at least 2 or 3 years ago.  She is on chronic prednisone. This is to try to help with her appetite. I would not think that this is causing issues.  Her white cell count is coming down slowly. She is on antibiotics. She probably needs to be on an antacid with her being on prednisone.  Her son really dictates what is done with her. He is in a very good job with her interview the fact that she has CLL and is on no therapy.  If there is anything that we can do to help out, just let us know. Otherwise, we will follow along peripherally.  [place] 44:4

## 2014-10-17 NOTE — Progress Notes (Signed)
Subjective: She has a headache this AM, pain is better.  Still some pain LLQ to midline lower abdomen.  Voiding is better she said she had not voided much this AM.    Objective: Vital signs in last 24 hours: Temp:  [98 F (36.7 C)-98.1 F (36.7 C)] 98 F (36.7 C) (04/20 0525) Pulse Rate:  [66-85] 66 (04/20 0525) Resp:  [15-16] 16 (04/20 0525) BP: (106-115)/(54-59) 113/54 mmHg (04/20 0525) SpO2:  [95 %-98 %] 98 % (04/20 0525) Last BM Date: 10/14/14 Afebrile, VSS 110 PO/NPO-ice chips K+ 3.2, AST better WBC 24.3 CXR:  Mildly increased central pulmonary vascular congestion is noted with possible minimal bilateral perihilar edema. Intake/Output from previous day: 04/19 0701 - 04/20 0700 In: 110 [P.O.:110] Out: 1475 [Urine:1475] Intake/Output this shift:    General appearance: alert, cooperative and no distress GI: soft, not really tender, but sore LLQ and lower abdomen.  + BS,  Lab Results:   Recent Labs  10/16/14 0450 10/17/14 0510  WBC 20.5* 24.3*  HGB 10.8* 10.1*  HCT 33.9* 32.3*  PLT 82* 104*    BMET  Recent Labs  10/16/14 0450 10/17/14 0510  NA 135 138  K 3.6 3.2*  CL 100 106  CO2 27 25  GLUCOSE 132* 133*  BUN 6 8  CREATININE 0.59 0.59  CALCIUM 8.0* 8.1*   PT/INR No results for input(s): LABPROT, INR in the last 72 hours.   Recent Labs Lab 10/15/14 1500 10/16/14 0450 10/17/14 0510  AST 18 310* 76*  ALT 11 158* 162*  ALKPHOS 70 147* 145*  BILITOT 0.5 0.9 0.3  PROT 5.9* 5.4* 5.2*  ALBUMIN 3.5 3.3* 3.1*     Lipase     Component Value Date/Time   LIPASE 30 10/15/2014 1500     Studies/Results: Ct Abdomen Pelvis Wo Contrast  10/15/2014   CLINICAL DATA:  Lower abdominal pain for 10-15 days.  EXAM: CT ABDOMEN AND PELVIS WITHOUT CONTRAST  TECHNIQUE: Multidetector CT imaging of the abdomen and pelvis was performed following the standard protocol without IV contrast.  COMPARISON:  Abdominal ultrasound 07/17/2014. CT abdomen and pelvis  06/08/2014.  FINDINGS: Mild scarring is noted in the lung bases, most notably in the right middle lobe and similar to the prior CT.  The spleen is again noted to be mildly enlarged. Gallstones demonstrated on the prior ultrasound are not clearly seen by CT. The liver, adrenal glands, left kidney, and pancreas have an unremarkable unenhanced appearance. 10 mm right renal cyst is unchanged.  Oral contrast is present in loops of nondilated small and large bowel to the level of the proximal transverse colon without evidence of obstruction. There is some soft tissue stranding/thickening along the sigmoid colon with suggestion of a degree of sigmoid colonic wall thickening, although evaluation is partially limited by underdistention. Between the sigmoid colon and bladder is a 2.4 x 1.7 cm low-density focus with a locule of gas suspicious for a small fluid collection. There is asymmetric, smooth bladder wall thickening in this region likely secondary to adjacent inflammation. Left-sided colonic diverticulosis is again seen. Appendix is unremarkable.  Calcified uterine fibroids are again seen. Mild atherosclerotic vascular calcification is noted. No free fluid or enlarged lymph nodes are identified. Chronic L1 compression fracture is again noted as well as chronic fusion across the L5-S1 disc space. SI joint degenerative changes are also noted as well as lower lumbar facet arthrosis.  IMPRESSION: Colonic diverticulosis with evidence of mild inflammatory changes about the sigmoid colon most likely  reflecting diverticulitis with a likely 2.4 cm contained perforation/small abscess between the sigmoid colon and bladder.   Electronically Signed   By: Logan Bores   On: 10/15/2014 21:06   Dg Chest 1 View  10/16/2014   CLINICAL DATA:  Acute shortness of breath.  EXAM: CHEST  1 VIEW  COMPARISON:  October 05, 2014.  FINDINGS: Stable cardiomediastinal silhouette. No pneumothorax or pleural effusion is noted. Mildly increased central  pulmonary vascular congestion is noted minimal bilateral perihilar edema may be present. No significant osseous abnormality is noted.  IMPRESSION: Mildly increased central pulmonary vascular congestion is noted with possible minimal bilateral perihilar edema.   Electronically Signed   By: Marijo Conception, M.D.   On: 10/16/2014 15:48    Medications: . methylPREDNISolone (SOLU-MEDROL) injection  20 mg Intravenous Daily  . piperacillin-tazobactam (ZOSYN)  IV  3.375 g Intravenous 3 times per day    Assessment/Plan Sigmoid diverticulitis with 2.4 cm abscess between bladder and sigmoid colon (site not amenable to percutaneous drain) Day 2.5 of Zosyn CLL Chronic steroid use,  Recent lumbar fracture Arthritis Hx of anemia  DVT: SCD only   Plan:  We will start her on clears and see how she does, continue antibiotics.  Add percocet for headache.      LOS: 2 days    Keyden Pavlov 10/17/2014

## 2014-10-18 DIAGNOSIS — R1310 Dysphagia, unspecified: Secondary | ICD-10-CM

## 2014-10-18 DIAGNOSIS — R7989 Other specified abnormal findings of blood chemistry: Secondary | ICD-10-CM

## 2014-10-18 LAB — BASIC METABOLIC PANEL
ANION GAP: 7 (ref 5–15)
BUN: 8 mg/dL (ref 6–23)
CO2: 24 mmol/L (ref 19–32)
Calcium: 7.9 mg/dL — ABNORMAL LOW (ref 8.4–10.5)
Chloride: 104 mmol/L (ref 96–112)
Creatinine, Ser: 0.67 mg/dL (ref 0.50–1.10)
GFR calc non Af Amer: 79 mL/min — ABNORMAL LOW (ref >90)
GLUCOSE: 109 mg/dL — AB (ref 70–99)
Potassium: 3.4 mmol/L — ABNORMAL LOW (ref 3.5–5.1)
Sodium: 135 mmol/L (ref 135–145)

## 2014-10-18 LAB — GLUCOSE, CAPILLARY
GLUCOSE-CAPILLARY: 164 mg/dL — AB (ref 70–99)
Glucose-Capillary: 114 mg/dL — ABNORMAL HIGH (ref 70–99)
Glucose-Capillary: 117 mg/dL — ABNORMAL HIGH (ref 70–99)
Glucose-Capillary: 129 mg/dL — ABNORMAL HIGH (ref 70–99)
Glucose-Capillary: 99 mg/dL (ref 70–99)

## 2014-10-18 LAB — CBC
HCT: 31.5 % — ABNORMAL LOW (ref 36.0–46.0)
Hemoglobin: 9.8 g/dL — ABNORMAL LOW (ref 12.0–15.0)
MCH: 23 pg — AB (ref 26.0–34.0)
MCHC: 31.1 g/dL (ref 30.0–36.0)
MCV: 73.8 fL — AB (ref 78.0–100.0)
PLATELETS: 115 10*3/uL — AB (ref 150–400)
RBC: 4.27 MIL/uL (ref 3.87–5.11)
RDW: 14.2 % (ref 11.5–15.5)
WBC: 23.6 10*3/uL — ABNORMAL HIGH (ref 4.0–10.5)

## 2014-10-18 MED ORDER — ACETAMINOPHEN 325 MG PO TABS: 650.0000 mg | ORAL_TABLET | Freq: Four times a day (QID) | ORAL | Status: DC | PRN

## 2014-10-18 MED ORDER — POTASSIUM CHLORIDE CRYS ER 20 MEQ PO TBCR
40.0000 meq | EXTENDED_RELEASE_TABLET | Freq: Once | ORAL | Status: AC
  Administered 2014-10-18: 40 meq via ORAL
  Filled 2014-10-18: qty 2

## 2014-10-18 MED ORDER — ACETAMINOPHEN 650 MG RE SUPP: 650.0000 mg | Freq: Four times a day (QID) | RECTAL | Status: DC | PRN

## 2014-10-18 MED ORDER — PANTOPRAZOLE SODIUM 40 MG PO TBEC
40.0000 mg | DELAYED_RELEASE_TABLET | Freq: Every day | ORAL | Status: DC
  Administered 2014-10-18 – 2014-10-20 (×3): 40 mg via ORAL
  Filled 2014-10-18 (×3): qty 1

## 2014-10-18 MED ORDER — MORPHINE SULFATE 2 MG/ML IJ SOLN: 1.0000 mg | INTRAMUSCULAR | Status: DC | PRN

## 2014-10-18 MED ORDER — PANTOPRAZOLE SODIUM 40 MG PO TBEC: 40.0000 mg | DELAYED_RELEASE_TABLET | Freq: Every day | ORAL | Status: DC

## 2014-10-18 MED ORDER — FUROSEMIDE 10 MG/ML IJ SOLN
20.0000 mg | Freq: Once | INTRAMUSCULAR | Status: AC
  Administered 2014-10-18: 20 mg via INTRAVENOUS
  Filled 2014-10-18: qty 2

## 2014-10-18 NOTE — Progress Notes (Signed)
Joy from radiology called and informed RN that the machines for the patients ordered scans is currently down, and she stated that it is a safety issue for the patients. She said the machine should be working by [employer], but that the radiology staff tomorrow would let RN staff know in the morning.

## 2014-10-18 NOTE — Discharge Instructions (Addendum)
Diverticulitis Diverticulitis is inflammation or infection of small pouches in your colon that form when you have a condition called diverticulosis. The pouches in your colon are called diverticula. Your colon, or large intestine, is where water is absorbed and stool is formed. Complications of diverticulitis can include:  Bleeding.  Severe infection.  Severe pain.  Perforation of your colon.  Obstruction of your colon. CAUSES  Diverticulitis is caused by bacteria. Diverticulitis happens when stool becomes trapped in diverticula. This allows bacteria to grow in the diverticula, which can lead to inflammation and infection. RISK FACTORS People with diverticulosis are at risk for diverticulitis. Eating a diet that does not include enough fiber from fruits and vegetables may make diverticulitis more likely to develop. SYMPTOMS  Symptoms of diverticulitis may include:  Abdominal pain and tenderness. The pain is normally located on the left side of the abdomen, but may occur in other areas.  Fever and chills.  Bloating.  Cramping.  Nausea.  Vomiting.  Constipation.  Diarrhea.  Blood in your stool. DIAGNOSIS  Your health care provider will ask you about your medical history and do a physical exam. You may need to have tests done because many medical conditions can cause the same symptoms as diverticulitis. Tests may include:  Blood tests.  Urine tests.  Imaging tests of the abdomen, including X-rays and CT scans. When your condition is under control, your health care provider may recommend that you have a colonoscopy. A colonoscopy can show how severe your diverticula are and whether something else is causing your symptoms. TREATMENT  Most cases of diverticulitis are mild and can be treated at home. Treatment may include:  Taking over-the-counter pain medicines.  Following a clear liquid diet.  Taking antibiotic medicines by mouth for 7-10 days. More severe cases may  be treated at a hospital. Treatment may include:  Not eating or drinking.  Taking prescription pain medicine.  Receiving antibiotic medicines through an IV tube.  Receiving fluids and nutrition through an IV tube.  Surgery. HOME CARE INSTRUCTIONS   Follow your health care provider's instructions carefully.  Follow a full liquid diet or other diet as directed by your health care provider. After your symptoms improve, your health care provider may tell you to change your diet. He or she may recommend you eat a high-fiber diet. Fruits and vegetables are good sources of fiber. Fiber makes it easier to pass stool.  Take fiber supplements or probiotics as directed by your health care provider.  Only take medicines as directed by your health care provider.  Keep all your follow-up appointments. SEEK MEDICAL CARE IF:   Your pain does not improve.  You have a hard time eating food.  Your bowel movements do not return to normal. SEEK IMMEDIATE MEDICAL CARE IF:   Your pain becomes worse.  Your symptoms do not get better.  Your symptoms suddenly get worse.  You have a fever.  You have repeated vomiting.  You have bloody or black, tarry stools. MAKE SURE YOU:   Understand these instructions.  Will watch your condition.  Will get help right away if you are not doing well or get worse. Document Released: 03/25/2005 Document Revised: 06/20/2013 Document Reviewed: 05/10/2013 [facility] Patient Information 2015 [place], [place]. This information is not intended to replace advice given to you by your health care provider. Make sure you discuss any questions you have with your health care provider.  Follow with [name], MD in 5-7 days  Please get a complete  blood count and chemistry panel checked by your Primary MD at your next visit, and again as instructed by your Primary MD. Please get your medications reviewed and adjusted by your Primary MD.  Please request your Primary  MD to go over all Hospital Tests and Procedure/Radiological results at the follow up, please get all Hospital records sent to your Prim MD by signing hospital release before you go home.  If you had Pneumonia of Lung problems at the Hospital: Please get a 2 view Chest X ray done in 6-8 weeks after hospital discharge or sooner if instructed by your Primary MD.  If you have Congestive Heart Failure: Please call your Cardiologist or Primary MD anytime you have any of the following symptoms:  1) 3 pound weight gain in 24 hours or 5 pounds in 1 week  2) shortness of breath, with or without a dry hacking cough  3) swelling in the hands, feet or stomach  4) if you have to sleep on extra pillows at night in order to breathe  Follow cardiac low salt diet and 1.5 lit/day fluid restriction.  If you have diabetes Accuchecks 4 times/day, Once in AM empty stomach and then before each meal. Log in all results and show them to your primary doctor at your next visit. If any glucose reading is under 80 or above 300 call your primary MD immediately.  If you have Seizure/Convulsions/Epilepsy: Please do not drive, operate heavy machinery, participate in activities at heights or participate in high speed sports until you have seen by Primary MD or a Neurologist and advised to do so again.  If you had Gastrointestinal Bleeding: Please ask your Primary MD to check a complete blood count within one week of discharge or at your next visit. Your endoscopic/colonoscopic biopsies that are pending at the time of discharge, will also need to followed by your Primary MD.  Get Medicines reviewed and adjusted. Please take all your medications with you for your next visit with your Primary MD  Please request your Primary MD to go over all hospital tests and procedure/radiological results at the follow up, please ask your Primary MD to get all Hospital records sent to his/her office.  If you experience worsening of your  admission symptoms, develop shortness of breath, life threatening emergency, suicidal or homicidal thoughts you must seek medical attention immediately by calling 911 or calling your MD immediately  if symptoms less severe.  You must read complete instructions/literature along with all the possible adverse reactions/side effects for all the Medicines you take and that have been prescribed to you. Take any new Medicines after you have completely understood and accpet all the possible adverse reactions/side effects.   Do not drive or operate heavy machinery when taking Pain medications.   Do not take more than prescribed Pain, Sleep and Anxiety Medications  Special Instructions: If you have smoked or chewed Tobacco  in the last 2 yrs please stop smoking, stop any regular Alcohol  and or any Recreational drug use.  Wear Seat belts while driving.  Please note You were cared for by a hospitalist during your hospital stay. If you have any questions about your discharge medications or the care you received while you were in the hospital after you are discharged, you can call the unit and asked to speak with the hospitalist on call if the hospitalist that took care of you is not available. Once you are discharged, your primary care physician will handle any further medical  issues. Please note that NO REFILLS for any discharge medications will be authorized once you are discharged, as it is imperative that you return to your primary care physician (or establish a relationship with a primary care physician if you do not have one) for your aftercare needs so that they can reassess your need for medications and monitor your lab values.  You can reach the hospitalist office at phone [phone] or fax [phone]   If you do not have a primary care physician, you can call [phone] for a physician referral.  Activity: As tolerated with Full fall precautions use walker/cane & assistance as needed  Diet:  soft, advance as tolerated  Disposition Home

## 2014-10-18 NOTE — Progress Notes (Signed)
PROGRESS NOTE  Stefanie Henry JQG:[id number] DOB: [birth year] DOA: 10/15/2014 PCP: [name], MD  HPI: Stefanie Henry is a 81 y.o. female with history of CLL, asthma presents to the ER because of worsening abdominal pain, found to have sigmoid diverticulitis with contained perforation and abscess.  Subjective / 24 H Interval events - complains of more shortness of breath last night and this morning - endorses progressive dysphagia for 3 months - denies nausea or vomiting   Assessment/Plan: Active Problems:   Chronic lymphocytic leukemia   Diverticulitis   Hyponatremia   Diverticulitis of large intestine with perforation and abscess without bleeding   Thrombocytopenia   Sigmoid diverticulitis with perforation/abscess - Patient presenting with a 2 week history of progressively worsening left lower quadrant abdominal pain - CT imaging showing sigmoid diverticulitis with contained perforation with abscess. - Gen. surgery consulted, continue IV antibiotics  Progressive dysphagia / early satiety  - GI consulted today, appreciate input  Hyponatremia. - Like he secondary to hypotonic hypovolemic hyponatremia  - Improved after the administration of IV fluids  Elevated transaminases. - Patient's liver enzymes elevation - MRCP per GI to exclude CBD stone/dilatation, appreciate input  History of CLL - Patient on chronic steroid therapy - oncology following  Leukocytosis, anemia, thrombocytopenia  - in the setting of CLL - monitor with daily CBC while hospitalized.   Shortness of breath, improved - CXR with Mildly increased central pulmonary vascular congestion  - Lasix x 1 today, wean off oxygen as needed    Diet: Diet clear liquid Room service appropriate?: Yes; Fluid consistency:: Thin Fluids: none DVT Prophylaxis: SCD  Code Status: Full Code Family Communication: discussed with son over phone in the morning and bedside in the afternoon Disposition Plan: home when  ready   Consultants:  General surgery   GI  Procedures:  None    Antibiotics  Anti-infectives    Start     Dose/Rate Route Frequency Ordered Stop   10/17/14 1800  fluconazole (DIFLUCAN) tablet 150 mg  Status:  Discontinued     150 mg Oral  Once 10/17/14 1707 10/17/14 1711   10/16/14 0245  piperacillin-tazobactam (ZOSYN) IVPB 3.375 g     3.375 g 12.5 mL/hr over 240 Minutes Intravenous 3 times per day 10/16/14 0238     10/15/14 2145  ciprofloxacin (CIPRO) IVPB 400 mg     400 mg 200 mL/hr over 60 Minutes Intravenous  Once 10/15/14 2137 10/15/14 2338   10/15/14 2145  metroNIDAZOLE (FLAGYL) IVPB 500 mg     500 mg 100 mL/hr over 60 Minutes Intravenous  Once 10/15/14 2137 10/16/14 0018      Studies Ct Abdomen Pelvis Wo Contrast 10/15/2014 Colonic diverticulosis with evidence of mild inflammatory changes about the sigmoid colon most likely reflecting diverticulitis with a likely 2.4 cm contained perforation/small abscess between the sigmoid colon and bladder.   Electronically Signed   By: Logan Bores   On: 10/15/2014 21:06  Dg Chest 1 View 10/16/2014 Mildly increased central pulmonary vascular congestion is noted with possible minimal bilateral perihilar edema.   Electronically Signed   By: Marijo Conception, M.D.   On: 10/16/2014 15:48   Objective  Filed Vitals:   10/17/14 1951 10/17/14 2126 10/18/14 0527 10/18/14 0738  BP: 138/74  129/68   Pulse: 72  77   Temp: 98.2 F (36.8 C)  97.7 F (36.5 C)   TempSrc: Oral  Oral   Resp: 16  16   Height:  Weight:      SpO2: 97% 97% 100% 96%    Intake/Output Summary (Last 24 hours) at 10/18/14 1339 Last data filed at 10/18/14 1138  Gross per 24 hour  Intake    930 ml  Output   1550 ml  Net   -620 ml   Filed Weights   10/15/14 1345 10/16/14 0015  Weight: 56.7 kg (125 lb) 56.7 kg (125 lb)   Exam:  General: NAD   HEENT: no scleral icterus, PERRL   Cardiovascular: RRR without MRG, 2+ peripheral pulses, no edema    Respiratory: CTA biL, good air movement, no wheezing, no crackles   Abdomen: soft, non tender, BS +, no guarding   MSK/Extremities: no clubbing/cyanosis, no joint swelling   Skin: no rashes   Neuro: non focal  Data Reviewed: Basic Metabolic Panel:  Recent Labs Lab 10/15/14 1500 10/16/14 0450 10/17/14 0510 10/18/14 0410  NA 128* 135 138 135  K 4.0 3.6 3.2* 3.4*  CL 94* 100 106 104  CO2 26 27 25 24   GLUCOSE 184* 132* 133* 109*  BUN 10 6 8 8   CREATININE 0.60 0.59 0.59 0.67  CALCIUM 8.1* 8.0* 8.1* 7.9*   Liver Function Tests:  Recent Labs Lab 10/15/14 1500 10/16/14 0450 10/17/14 0510  AST 18 310* 76*  ALT 11 158* 162*  ALKPHOS 70 147* 145*  BILITOT 0.5 0.9 0.3  PROT 5.9* 5.4* 5.2*  ALBUMIN 3.5 3.3* 3.1*    Recent Labs Lab 10/15/14 1500  LIPASE 30   CBC:  Recent Labs Lab 10/15/14 1500 10/16/14 0450 10/17/14 0510 10/18/14 0410  WBC 32.1* 20.5* 24.3* 23.6*  NEUTROABS 3.2 2.5  --   --   HGB 10.8* 10.8* 10.1* 9.8*  HCT 34.0* 33.9* 32.3* 31.5*  MCV 71.6* 73.5* 73.2* 73.8*  PLT 122* 82* 104* 115*   ProBNP (last 3 results)  Recent Labs  10/05/14 1142  PROBNP 81.0   CBG:  Recent Labs Lab 10/17/14 1208 10/17/14 1804 10/18/14 0005 10/18/14 0539 10/18/14 1213  GLUCAP 152* 144* 117* 99 129*   Recent Results (from the past 240 hour(s))  Blood culture (routine x 2)     Status: None (Preliminary result)   Collection Time: 10/15/14  3:05 PM  Result Value Ref Range Status   Specimen Description BLOOD RIGHT HAND  Final   Special Requests BOTTLES DRAWN AEROBIC AND ANAEROBIC 5CC EACH  Final   Culture   Final           BLOOD CULTURE RECEIVED NO GROWTH TO DATE CULTURE WILL BE HELD FOR 5 DAYS BEFORE ISSUING A FINAL NEGATIVE REPORT Performed at [facility]    Report Status PENDING  Incomplete  Blood culture (routine x 2)     Status: None (Preliminary result)   Collection Time: 10/15/14  3:05 PM  Result Value Ref Range Status   Specimen  Description BLOOD RIGHT AC  Final   Special Requests BOTTLES DRAWN AEROBIC AND ANAEROBIC 5CC EACH  Final   Culture   Final           BLOOD CULTURE RECEIVED NO GROWTH TO DATE CULTURE WILL BE HELD FOR 5 DAYS BEFORE ISSUING A FINAL NEGATIVE REPORT Performed at [facility]    Report Status PENDING  Incomplete     Scheduled Meds: . albuterol  2.5 mg Inhalation BID  . methylPREDNISolone (SOLU-MEDROL) injection  20 mg Intravenous Daily  . pantoprazole  40 mg Oral Daily  . piperacillin-tazobactam (ZOSYN)  IV  3.375  g Intravenous 3 times per day    Continuous Infusions:    Time spent: 25 minutes more than 50% involved in family discussions with patient's son  Marzetta Board, MD [facility] Pager [phone]. If 7 PM - 7 AM, please contact night-coverage at [email or web address], password [facility] 10/18/2014, 1:39 PM  LOS: 3 days

## 2014-10-18 NOTE — Progress Notes (Signed)
  Subjective: She is doing OK from her abdomen, not taking much PO, her biggest complaint is her breathing.  She says she has some issues at home, but worse last PM, sats are not consistent with her complaints.    Objective: Vital signs in last 24 hours: Temp:  [97.7 F (36.5 C)-98.4 F (36.9 C)] 97.7 F (36.5 C) (04/21 0527) Pulse Rate:  [72-83] 77 (04/21 0527) Resp:  [16] 16 (04/21 0527) BP: (119-138)/(63-74) 129/68 mmHg (04/21 0527) SpO2:  [97 %-100 %] 100 % (04/21 0527) Last BM Date: 10/14/14 120 Po recorded Clear liquids Afebrile, VSS WBC stable,K+ 3.4 Intake/Output from previous day: 04/20 0701 - 04/21 0700 In: 320 [P.O.:120; I.V.:100; IV Piggyback:100] Out: 1125 [Urine:1125] Intake/Output this shift: Total I/O In: -  Out: 100 [Urine:100]  General appearance: alert, cooperative, no distress and concerned about her breathing. Resp: clear to auscultation bilaterally GI: soft, non-tender; bowel sounds normal; no masses,  no organomegaly and she is sitting up but not having pain now.  Lab Results:   Recent Labs  10/17/14 0510 10/18/14 0410  WBC 24.3* 23.6*  HGB 10.1* 9.8*  HCT 32.3* 31.5*  PLT 104* 115*    BMET  Recent Labs  10/17/14 0510 10/18/14 0410  NA 138 135  K 3.2* 3.4*  CL 106 104  CO2 25 24  GLUCOSE 133* 109*  BUN 8 8  CREATININE 0.59 0.67  CALCIUM 8.1* 7.9*   PT/INR No results for input(s): LABPROT, INR in the last 72 hours.   Recent Labs Lab 10/15/14 1500 10/16/14 0450 10/17/14 0510  AST 18 310* 76*  ALT 11 158* 162*  ALKPHOS 70 147* 145*  BILITOT 0.5 0.9 0.3  PROT 5.9* 5.4* 5.2*  ALBUMIN 3.5 3.3* 3.1*     Lipase     Component Value Date/Time   LIPASE 30 10/15/2014 1500     Studies/Results: Dg Chest 1 View  10/16/2014   CLINICAL DATA:  Acute shortness of breath.  EXAM: CHEST  1 VIEW  COMPARISON:  October 05, 2014.  FINDINGS: Stable cardiomediastinal silhouette. No pneumothorax or pleural effusion is noted. Mildly increased  central pulmonary vascular congestion is noted minimal bilateral perihilar edema may be present. No significant osseous abnormality is noted.  IMPRESSION: Mildly increased central pulmonary vascular congestion is noted with possible minimal bilateral perihilar edema.   Electronically Signed   By: Marijo Conception, M.D.   On: 10/16/2014 15:48    Medications: . albuterol  2.5 mg Inhalation BID  . methylPREDNISolone (SOLU-MEDROL) injection  20 mg Intravenous Daily  . piperacillin-tazobactam (ZOSYN)  IV  3.375 g Intravenous 3 times per day    Assessment/Plan Sigmoid diverticulitis with 2.4 cm abscess between bladder and sigmoid colon (site not amenable to percutaneous drain) Day 2.5 of Zosyn CLL Chronic steroid use,  Recent lumbar fracture Arthritis Hx of anemia  Thrombocytopenia DVT: SCD only   Plan:  Continue IV antibiotics, full liquids and will defer her Upper respiratory issues to Hospitalist.  Consider adding PPI.  Will discuss changing to PO antibiotics tomorrow.    LOS: 3 days    Sunil Hue 10/18/2014

## 2014-10-18 NOTE — Consult Note (Signed)
Referring Provider: [facility] Primary Care Physician:  [name], MD Primary Gastroenterologist:  Dr Fuller Plan  Reason for Consultation:   Early satiety and solid food dysphagia   HPI: Stefanie Henry is a 81 y.o. female who was followed medically at the [facility] on [address] in [place]. She has a history of CLL, and asthma, and came to the emergency room on April 18 with complaints of abdominal pain. Patient says that for 2-3 weeks prior to coming to the emergency room she had left lower quadrant abdominal pain. She denies having diarrhea, nausea, or vomiting. She did have some intermittent fever and chills. Which came to the ER CT of the abdomen and pelvis showed a left lower quadrant sigmoid diverticulitis with contained perforation and abscess. She was evaluated by surgery and has been treated with IV antibiotics. Her main complaint has of today is dyspnea.  The patient also states that for the past 2-3 months she has had difficulty swallowing solids. She has not had to regurgitate food. She has no dysphagia to liquids. She states she feels full after 2 or 3 bites and has had to eat very small frequent meals and graze throughout the day rather than eating 3 meals a day. Her weight has been stable. She has not vomited.  Review of her electronic medical record shows she had been evaluated by Dr. Fuller Plan in June 2015 for concerns of chronic cholecystitis and cholelithiasis the patient declined surgery. The patient has a long history of frequent, mild upper abdominal discomfort, bloating and gas. Her symptoms typically respond somewhat to be no, probiotics, Gas-X, Mylanta, Pepcid, and occasional omeprazole. Her symptoms have not changed significantly over the past several years.   Past Medical History  Diagnosis Date  . Asthma   . Arthritis   . Shingles   . Anemia, iron deficiency 06/06/2012  . Chronic lymphatic leukemia   . Gallstones     History reviewed. No  pertinent past surgical history.  Prior to Admission medications   Medication Sig Start Date End Date Taking? Authorizing Provider  albuterol (PROVENTIL HFA;VENTOLIN HFA) 108 (90 BASE) MCG/ACT inhaler Inhale 1-2 puffs into the lungs every 6 (six) hours as needed for wheezing or shortness of breath.   Yes Historical Provider, MD  calcitonin, salmon, (MIACALCIN/FORTICAL) 200 UNIT/ACT nasal spray Place 1 spray into alternate nostrils daily. 06/15/14  Yes Daniel J Angiulli, PA-C  famotidine (PEPCID) 20 MG tablet Take 1 tablet (20 mg total) by mouth daily. 06/15/14  Yes Daniel J Angiulli, PA-C  gabapentin (NEURONTIN) 100 MG capsule Take 1 capsule (100 mg total) by mouth daily. 06/15/14  Yes Daniel J Angiulli, PA-C  predniSONE (DELTASONE) 10 MG tablet TAKE 1 TABLET BY MOUTH ONCE DAILY 10/07/14  Yes [name], MD  Simethicone (GAS-X PO) Take 2 capsules by mouth daily.    Yes Historical Provider, MD  pantoprazole (PROTONIX) 40 MG tablet Take 1 tablet (40 mg total) by mouth daily. Take 30-60 min before first meal of the day Patient not taking: Reported on 10/16/2014 10/05/14   Tanda Rockers, MD  predniSONE (DELTASONE) 10 MG tablet TAKE 1 TABLET BY MOUTH ONCE DAILY 10/03/14   [name], MD    Current Facility-Administered Medications  Medication Dose Route Frequency Provider Last Rate Last Dose  . acetaminophen (TYLENOL) tablet 650 mg  650 mg Oral Q6H PRN Rise Patience, MD       Or  . acetaminophen (TYLENOL) suppository 650 mg  650 mg Rectal Q6H PRN Rise Patience, MD      . albuterol (PROVENTIL) (2.5 MG/3ML) 0.083% nebulizer solution 2.5 mg  2.5 mg Nebulization Q6H PRN Rise Patience, MD      . albuterol (PROVENTIL) (2.5 MG/3ML) 0.083% nebulizer solution 2.5 mg  2.5 mg Inhalation BID Gardiner Barefoot, NP   2.5 mg at 10/18/14 0738  . methylPREDNISolone sodium succinate (SOLU-MEDROL) 40 mg/mL injection 20 mg  20 mg Intravenous Daily Rise Patience, MD   20 mg at 10/18/14  1001  . morphine 2 MG/ML injection 1 mg  1 mg Intravenous Q4H PRN Rise Patience, MD   1 mg at 10/16/14 0126  . ondansetron (ZOFRAN) tablet 4 mg  4 mg Oral Q6H PRN Rise Patience, MD       Or  . ondansetron [facility]) injection 4 mg  4 mg Intravenous Q6H PRN Rise Patience, MD      . oxyCODONE-acetaminophen (PERCOCET/ROXICET) 5-325 MG per tablet 1-2 tablet  1-2 tablet Oral Q4H PRN Earnstine Regal, PA-C   1 tablet at 10/18/14 0748  . piperacillin-tazobactam (ZOSYN) IVPB 3.375 g  3.375 g Intravenous 3 times per day Rise Patience, MD   3.375 g at 10/18/14 1442    Allergies as of 10/15/2014 - Review Complete 10/15/2014  Allergen Reaction Noted  . Contrast media [iodinated diagnostic agents] Other (See Comments) 06/08/2014  . Fruit & vegetable daily [nutritional supplements] Other (See Comments) 06/08/2014  . Ibuprofen Other (See Comments) 06/08/2014    Family History  Problem Relation Age of Onset  . Diabetes Father     History   Social History  . Marital Status: Married    Spouse Name: N/A  . Number of Children: 2  . Years of Education: N/A   Occupational History  . Retired    Social History Main Topics  . Smoking status: Never Smoker   . Smokeless tobacco: Never Used     Comment: PT NEVER SMOKED  . Alcohol Use: No  . Drug Use: No  . Sexual Activity: Not on file   Other Topics Concern  . Not on file   Social History Narrative    Review of Systems: See history of present illness otherwise negative  Physical Exam: Vital signs in last 24 hours: Temp:  [97.7 F (36.5 C)-98.4 F (36.9 C)] 98.4 F (36.9 C) (04/21 1411) Pulse Rate:  [72-88] 88 (04/21 1411) Resp:  [16] 16 (04/21 1411) BP: (114-138)/(66-74) 114/66 mmHg (04/21 1411) SpO2:  [96 %-100 %] 97 % (04/21 1411) Last BM Date: 10/14/14 General:   Alert,  Well-developed, well-nourished, pleasant and cooperative in NAD Head:  Normocephalic and atraumatic. Eyes:  Sclera clear, no icterus.  Conjunctiva pink. Ears:  Normal auditory acuity. Nose:  No deformity, discharge,  or lesions. Mouth:  No deformity or lesions.   Neck:  Supple; no masses or thyromegaly. Lungs:  Clear throughout to auscultation.   No wheezes, crackles, or rhonchi.  Heart:  Regular rate and rhythm; no murmurs, clicks, rubs,  or gallops. Abdomen:  Soft,nontender, BS active,nonpalp mass or hsm.   Rectal:  Deferred  Msk:  Symmetrical without gross deformities. . Pulses:  Normal pulses noted. Extremities: Without clubbing or edema. Neurologic: Alert and  oriented x4;  grossly normal neurologically. Skin: Intact without significant lesions or rashes.. Psych:  Alert and cooperative. Normal mood and affect.  Intake/Output from previous day: 04/20 0701 - 04/21 0700 In: 320 [P.O.:120; I.V.:100; IV Piggyback:100] Out: 1125 [XNATF:5732]  Intake/Output this shift: Total I/O In: 910 [P.O.:360; I.V.:550] Out: 600 [Urine:600]  Lab Results:  Recent Labs  10/16/14 0450 10/17/14 0510 10/18/14 0410  WBC 20.5* 24.3* 23.6*  HGB 10.8* 10.1* 9.8*  HCT 33.9* 32.3* 31.5*  PLT 82* 104* 115*   BMET  Recent Labs  10/16/14 0450 10/17/14 0510 10/18/14 0410  NA 135 138 135  K 3.6 3.2* 3.4*  CL 100 106 104  CO2 27 25 24   GLUCOSE 132* 133* 109*  BUN 6 8 8   CREATININE 0.59 0.59 0.67  CALCIUM 8.0* 8.1* 7.9*   LFT  Recent Labs  10/17/14 0510  PROT 5.2*  ALBUMIN 3.1*  AST 76*  ALT 162*  ALKPHOS 145*  BILITOT 0.3   On 10/15/2014 alkaline phosphatase was 70 AST 18 ALT 11.   Studies/Results: Dg Chest 1 View  10/16/2014   CLINICAL DATA:  Acute shortness of breath.  EXAM: CHEST  1 VIEW  COMPARISON:  October 05, 2014.  FINDINGS: Stable cardiomediastinal silhouette. No pneumothorax or pleural effusion is noted. Mildly increased central pulmonary vascular congestion is noted minimal bilateral perihilar edema may be present. No significant osseous abnormality is noted.  IMPRESSION: Mildly increased central  pulmonary vascular congestion is noted with possible minimal bilateral perihilar edema.   Electronically Signed   By: Marijo Conception, M.D.   On: 10/16/2014 15:48  ULTRASOUND ABDOMEN COMPLETE  COMPARISON: 06/08/2014 unenhanced CT of the abdomen and pelvis. 10/09/2013 ultrasound.  FINDINGS: Gallbladder: Multiple gallstones measuring up to 1.4 cm. Gallbladder wall measures up to 2.4 mm. No obvious pericholecystic fluid although evaluation of the posterior wall limited by significant shadowing from gallstones. Per ultrasound technologist, the patient was not tender over this region during scanning.  Common bile duct: Diameter: 3 mm.  Liver: No focal lesion identified. What was noted previously may represent cyst associated with the right kidney.  IVC: Limited evaluation secondary to bowel gas.  Pancreas: Limited evaluation secondary to bowel gas.  Spleen: Enlarged spanning over 14 cm with 560 mL volume.  Right Kidney: Length: 8.6 cm. No hydronephrosis. Right upper pole 1.3 cm cyst.  Left Kidney: Length: 9.1 cm. No hydronephrosis or mass identified.  Abdominal aorta: Portions poorly delineated secondary to bowel gas. Atherosclerotic type changes present without obvious aneurysm.  Other findings: None.  IMPRESSION: Multiple gallstones measuring up to 1.4 cm. Gallbladder wall measures up to 2.4 mm. No obvious pericholecystic fluid although evaluation of the posterior wall limited by significant shadowing from gallstones. Per ultrasound technologist, the patient was not tender over this region during scanning.  Splenomegaly.  No liver lesion identified. What was noted previously may represent cyst arising from the right kidney.  Secondary to bowel gas, poor delineation of pancreas, inferior vena cava and portions of the abdominal aorta. Atherosclerotic type changes of the aorta are noted.   Electronically Signed  By: Chauncey Cruel M.D.  On: 07/17/2014  09:44  IMPRESSION/PLAN:  #10. A 81 year old female admitted with sigmoid diverticulitis with a 2.4 cm abscess between the bladder and sigmoid. Patient has been evaluated by surgery and is being managed with IV antibiotics.  #2. CLL. Followed by oncology.  #3. Solid food dysphagia and early satiety. Patient has had these complaints for 2-3 months. We will plan on upper GI at this time. Patient may need EGD at a later date when medically stable. (The patient has declined upper GI as an outpatient in the past and she did not like to drink liquids required further studies. Patient was offered  EGD with possible biopsy and possible dilation in June 2000 fifth but declined any procedures at that time. She was returned to her primary care physician for management.)Pantoprazole ordered.  #4. Elevated alkaline phosphatase and transaminases. These are risen over the past several days. Question of patient passed a stone. Will obtain MRCP. Will repeat hepatic function panel and lipase in the morning. Will get MRCP prior to upper GI.( Question if patient may have chronic cholecystitis versus GERD or functional dyspepsia.)  Stefanie Henry, Stefanie Barley PA-C 10/18/2014,  Pager [phone]

## 2014-10-19 ENCOUNTER — Inpatient Hospital Stay (HOSPITAL_COMMUNITY): Payer: Medicare Other

## 2014-10-19 DIAGNOSIS — R1310 Dysphagia, unspecified: Secondary | ICD-10-CM | POA: Insufficient documentation

## 2014-10-19 DIAGNOSIS — R945 Abnormal results of liver function studies: Secondary | ICD-10-CM | POA: Insufficient documentation

## 2014-10-19 DIAGNOSIS — R0600 Dyspnea, unspecified: Secondary | ICD-10-CM

## 2014-10-19 DIAGNOSIS — R7989 Other specified abnormal findings of blood chemistry: Secondary | ICD-10-CM | POA: Insufficient documentation

## 2014-10-19 LAB — LIPASE, BLOOD: LIPASE: 18 U/L (ref 11–59)

## 2014-10-19 LAB — HEPATIC FUNCTION PANEL
ALBUMIN: 3.5 g/dL (ref 3.5–5.2)
ALT: 120 U/L — ABNORMAL HIGH (ref 0–35)
AST: 40 U/L — AB (ref 0–37)
Alkaline Phosphatase: 198 U/L — ABNORMAL HIGH (ref 39–117)
BILIRUBIN TOTAL: 0.5 mg/dL (ref 0.3–1.2)
Bilirubin, Direct: 0.1 mg/dL (ref 0.0–0.5)
Indirect Bilirubin: 0.4 mg/dL (ref 0.3–0.9)
Total Protein: 5.6 g/dL — ABNORMAL LOW (ref 6.0–8.3)

## 2014-10-19 LAB — GLUCOSE, CAPILLARY
Glucose-Capillary: 102 mg/dL — ABNORMAL HIGH (ref 70–99)
Glucose-Capillary: 114 mg/dL — ABNORMAL HIGH (ref 70–99)

## 2014-10-19 MED ORDER — GADOBENATE DIMEGLUMINE 529 MG/ML IV SOLN
9.0000 mL | Freq: Once | INTRAVENOUS | Status: AC | PRN
Start: 2014-10-19 — End: 2014-10-19
  Administered 2014-10-19: 9 mL via INTRAVENOUS

## 2014-10-19 NOTE — Progress Notes (Signed)
Son, Stefanie Henry called about his mother being upset that she cannot eat or drink. RN tried to explain to the patient and her son of the importance of not eating or drinking until both procedures are completed.   RN informed the son that RN would call MRI/xray when the son arrived to see if they can see patient early this morning.

## 2014-10-19 NOTE — Progress Notes (Signed)
ANTIBIOTIC CONSULT NOTE - follow up  Pharmacy Consult for zosyn Indication: Intra-abdominal infection  Allergies  Allergen Reactions  . Contrast Media [Iodinated Diagnostic Agents] Other (See Comments)    Stomach discomfort  . Fruit & Vegetable Daily [Nutritional Supplements] Other (See Comments)    Citrus fruit causes stomach discomfort  . Ibuprofen Other (See Comments)    Stomach discomfort    Patient Measurements: Height: 4\' 8"  (142.2 cm) Weight: 125 lb (56.7 kg) IBW/kg (Calculated) : 36.3 Adjusted Body Weight:   Vital Signs: Temp: 98.6 F (37 C) (04/22 0531) Temp Source: Oral (04/22 0531) BP: 120/66 mmHg (04/22 0531) Pulse Rate: 78 (04/22 0531) Intake/Output from previous day: 04/21 0701 - 04/22 0700 In: 1060 [P.O.:460; I.V.:550; IV Piggyback:50] Out: 600 [Urine:600] Intake/Output from this shift: Total I/O In: 120 [P.O.:120] Out: -   Labs:  Recent Labs  10/17/14 0510 10/18/14 0410  WBC 24.3* 23.6*  HGB 10.1* 9.8*  PLT 104* 115*  CREATININE 0.59 0.67   Estimated Creatinine Clearance: 37.4 mL/min (by C-G formula based on Cr of 0.67). No results for input(s): VANCOTROUGH, VANCOPEAK, VANCORANDOM, GENTTROUGH, GENTPEAK, GENTRANDOM, TOBRATROUGH, TOBRAPEAK, TOBRARND, AMIKACINPEAK, AMIKACINTROU, AMIKACIN in the last 72 hours.   Microbiology: Recent Results (from the past 720 hour(s))  Urine culture     Status: None (Preliminary result)   Collection Time: 10/15/14  1:40 PM  Result Value Ref Range Status   Specimen Description URINE, CLEAN CATCH  Final   Special Requests NONE  Final   Colony Count   Final    25,000 COLONIES/ML Performed at [facility]    Culture   Final    Ingram Performed at [facility]    Report Status PENDING  Incomplete  Blood culture (routine x 2)     Status: None (Preliminary result)   Collection Time: 10/15/14  3:05 PM  Result Value Ref Range Status   Specimen Description BLOOD RIGHT HAND  Final   Special Requests BOTTLES DRAWN AEROBIC AND ANAEROBIC 5CC EACH  Final   Culture   Final           BLOOD CULTURE RECEIVED NO GROWTH TO DATE CULTURE WILL BE HELD FOR 5 DAYS BEFORE ISSUING A FINAL NEGATIVE REPORT Performed at [facility]    Report Status PENDING  Incomplete  Blood culture (routine x 2)     Status: None (Preliminary result)   Collection Time: 10/15/14  3:05 PM  Result Value Ref Range Status   Specimen Description BLOOD RIGHT AC  Final   Special Requests BOTTLES DRAWN AEROBIC AND ANAEROBIC 5CC EACH  Final   Culture   Final           BLOOD CULTURE RECEIVED NO GROWTH TO DATE CULTURE WILL BE HELD FOR 5 DAYS BEFORE ISSUING A FINAL NEGATIVE REPORT Performed at [facility]    Report Status PENDING  Incomplete    Medical History: Past Medical History  Diagnosis Date  . Asthma   . Arthritis   . Shingles   . Anemia, iron deficiency 06/06/2012  . Chronic lymphatic leukemia   . Gallstones     Medications:  Anti-infectives    Start     Dose/Rate Route Frequency Ordered Stop   10/17/14 1800  fluconazole (DIFLUCAN) tablet 150 mg  Status:  Discontinued     150 mg Oral  Once 10/17/14 1707 10/17/14 1711   10/16/14 0245  piperacillin-tazobactam (ZOSYN) IVPB 3.375 g     3.375 g 12.5 mL/hr over  240 Minutes Intravenous 3 times per day 10/16/14 0238     10/15/14 2145  ciprofloxacin (CIPRO) IVPB 400 mg     400 mg 200 mL/hr over 60 Minutes Intravenous  Once 10/15/14 2137 10/15/14 2338   10/15/14 2145  metroNIDAZOLE (FLAGYL) IVPB 500 mg     500 mg 100 mL/hr over 60 Minutes Intravenous  Once 10/15/14 2137 10/16/14 0018     Assessment: Patient with intra-abdominal infection, hx of CLL and asthma.    Goal of Therapy:  Zosyn based on renal function   Plan:  Zosyn 3.375g IV Q8H infused over 4hrs.  F/u change to po antibiotics  Dolly Rias RPh 10/19/2014, 2:03 PM Pager [phone]

## 2014-10-19 NOTE — Progress Notes (Signed)
[facility] Progress Note  Subjective:  No further lower abd pain. Has not had BM. Wants to eat. MRCP done earlier this morning.No chest pain.   Objective:  Vital signs in last 24 hours: Temp:  [97.6 F (36.4 C)-99.6 F (37.6 C)] 99.6 F (37.6 C) (04/22 1438) Pulse Rate:  [78-98] 84 (04/22 1438) Resp:  [16] 16 (04/22 1438) BP: (113-120)/(63-67) 116/67 mmHg (04/22 1438) SpO2:  [94 %-98 %] 97 % (04/22 1438) Last BM Date: 10/14/14 General:   Alert,  Well-developed,    in NAD Heart:  Regular rate and rhythm; no murmurs Pulm;lungs clear Abdomen:  Soft, nontender and nondistended. Normal bowel sounds, without guarding, and without rebound.   Extremities:  Without edema. Neurologic: Alert and  oriented x4;  grossly normal neurologically. Psych: Alert and cooperative. Normal mood and affect.  Intake/Output from previous day: 04/21 0701 - 04/22 0700 In: 1060 [P.O.:460; I.V.:550; IV Piggyback:50] Out: 600 [Urine:600] Intake/Output this shift: Total I/O In: 240 [P.O.:240] Out: -   Lab Results:  Recent Labs  10/17/14 0510 10/18/14 0410  WBC 24.3* 23.6*  HGB 10.1* 9.8*  HCT 32.3* 31.5*  PLT 104* 115*   BMET  Recent Labs  10/17/14 0510 10/18/14 0410  NA 138 135  K 3.2* 3.4*  CL 106 104  CO2 25 24  GLUCOSE 133* 109*  BUN 8 8  CREATININE 0.59 0.67  CALCIUM 8.1* 7.9*   LFT  Recent Labs  10/19/14 0415  PROT 5.6*  ALBUMIN 3.5  AST 40*  ALT 120*  ALKPHOS 198*  BILITOT 0.5  BILIDIR 0.1  IBILI 0.4   10/17/2014 AST 76, ALT 162, alkaline phosphatase 145, total bili 0.3. 10/16/2014 AST 310, ALT 158, alkaline phosphatase 147, total bili 0.9. 10/15/2014 AST 18, ALT 11, alkaline phosphatase 70, total bili 0.5. Patient was admitted on April 19.  Mr 3d Recon At Scanner  10/19/2014   CLINICAL DATA:  81 year old female with elevated liver function tests with nausea and vomiting. Evaluate for cholelithiasis or choledocholithiasis.  EXAM: MRI ABDOMEN  WITHOUT AND WITH CONTRAST (INCLUDING MRCP)  TECHNIQUE: Multiplanar multisequence MR imaging of the abdomen was performed both before and after the administration of intravenous contrast. Heavily T2-weighted images of the biliary and pancreatic ducts were obtained, and three-dimensional MRCP images were rendered by post processing.  CONTRAST:  35mL MULTIHANCE GADOBENATE DIMEGLUMINE 529 MG/ML IV SOLN  COMPARISON:  CT of the abdomen and pelvis 10/15/2014.  FINDINGS: Lower chest:  Unremarkable.  Hepatobiliary: Diffuse loss of signal intensity throughout the hepatic parenchyma on in of phase dual echo images, and diffuse low T2 signal intensity throughout the hepatic parenchyma, compatible with iron deposition. No cystic or solid hepatic lesions. MRCP images demonstrate no intrahepatic biliary ductal dilatation. Extrahepatic bile duct is prominent, with the common bile duct measuring up to 8 mm in the porta hepatis, within normal limits for patient of this age. Gallbladder is contracted around numerous gallstones and measure up to 1.4 cm in diameter. No definite filling defect within the common bile duct to suggest the presence of a ductal stone.  Pancreas: No pancreatic mass. Pancreatic duct is normal in caliber on MRCP images. No pancreatic or peripancreatic fluid or inflammatory changes.  Spleen: Loss of signal intensity throughout the splenic parenchyma on in phase dual echo images, and low T2 signal intensity throughout the spleen, also compatible with iron deposition.  Adrenals/Urinary Tract: Bilateral adrenal glands are normal in appearance. Several small T1 hypointense, T2 hyperintense, nonenhancing lesions are  noted in the kidneys bilaterally, largest of which measures 4 mm in the interpolar region of the right kidney, compatible with small simple cysts. In addition, in the interpolar region of the right kidney there is a 5 mm T1 hyperintense lesion which is poorly visualized on the other pulse sequences  secondary to motion, favored to represent a small proteinaceous/ hemorrhagic cyst. No hydroureteronephrosis in the visualized portions of the abdomen.  Stomach/Bowel: Unremarkable.  Vascular/Lymphatic: No aneurysm identified in the visualized abdominal vasculature.  Other: No significant volume of ascites in the visualized abdomen.  Musculoskeletal: No aggressive appearing osseous lesion noted in the visualized portions of the skeleton.  IMPRESSION: 1. Cholelithiasis without evidence of acute cholecystitis, and without evidence of choledocholithiasis or biliary tract obstruction. 2. Although the common bile duct is mildly dilated 8 mm in the porta hepatis, this is within normal limits for a patient this age, and there is no associated intrahepatic biliary ductal dilatation. 3. Extensive iron deposition throughout the liver and spleen, as above. 4. Additional incidental findings, as above.   Electronically Signed   By: Vinnie Langton M.D.   On: 10/19/2014 11:56   Mr Jeananne Rama W/wo Cm/mrcp  10/19/2014   CLINICAL DATA:  81 year old female with elevated liver function tests with nausea and vomiting. Evaluate for cholelithiasis or choledocholithiasis.  EXAM: MRI ABDOMEN WITHOUT AND WITH CONTRAST (INCLUDING MRCP)  TECHNIQUE: Multiplanar multisequence MR imaging of the abdomen was performed both before and after the administration of intravenous contrast. Heavily T2-weighted images of the biliary and pancreatic ducts were obtained, and three-dimensional MRCP images were rendered by post processing.  CONTRAST:  84mL MULTIHANCE GADOBENATE DIMEGLUMINE 529 MG/ML IV SOLN  COMPARISON:  CT of the abdomen and pelvis 10/15/2014.  FINDINGS: Lower chest:  Unremarkable.  Hepatobiliary: Diffuse loss of signal intensity throughout the hepatic parenchyma on in of phase dual echo images, and diffuse low T2 signal intensity throughout the hepatic parenchyma, compatible with iron deposition. No cystic or solid hepatic lesions. MRCP images  demonstrate no intrahepatic biliary ductal dilatation. Extrahepatic bile duct is prominent, with the common bile duct measuring up to 8 mm in the porta hepatis, within normal limits for patient of this age. Gallbladder is contracted around numerous gallstones and measure up to 1.4 cm in diameter. No definite filling defect within the common bile duct to suggest the presence of a ductal stone.  Pancreas: No pancreatic mass. Pancreatic duct is normal in caliber on MRCP images. No pancreatic or peripancreatic fluid or inflammatory changes.  Spleen: Loss of signal intensity throughout the splenic parenchyma on in phase dual echo images, and low T2 signal intensity throughout the spleen, also compatible with iron deposition.  Adrenals/Urinary Tract: Bilateral adrenal glands are normal in appearance. Several small T1 hypointense, T2 hyperintense, nonenhancing lesions are noted in the kidneys bilaterally, largest of which measures 4 mm in the interpolar region of the right kidney, compatible with small simple cysts. In addition, in the interpolar region of the right kidney there is a 5 mm T1 hyperintense lesion which is poorly visualized on the other pulse sequences secondary to motion, favored to represent a small proteinaceous/ hemorrhagic cyst. No hydroureteronephrosis in the visualized portions of the abdomen.  Stomach/Bowel: Unremarkable.  Vascular/Lymphatic: No aneurysm identified in the visualized abdominal vasculature.  Other: No significant volume of ascites in the visualized abdomen.  Musculoskeletal: No aggressive appearing osseous lesion noted in the visualized portions of the skeleton.  IMPRESSION: 1. Cholelithiasis without evidence of acute cholecystitis, and without evidence of  choledocholithiasis or biliary tract obstruction. 2. Although the common bile duct is mildly dilated 8 mm in the porta hepatis, this is within normal limits for a patient this age, and there is no associated intrahepatic biliary  ductal dilatation. 3. Extensive iron deposition throughout the liver and spleen, as above. 4. Additional incidental findings, as above.   Electronically Signed   By: Vinnie Langton M.D.   On: 10/19/2014 11:56    ASSESSMENT/PLAN:  #1. Diverticulitis. On antibiotics. Clinically improving. 2. Dysphagia. This has been a long-standing problem for 3-4 months. Patient is having adequate by mouth intake with soft foods. Have advanced patient to full liquids and if tolerated will advance to soft or pured diet and patient can follow as outpatient at GI office with outpatient scheduling of esophagram with barium pill and EGD if warranted.( Pt has appt May 3 at 2:00) #3. Abnormal LFTs. Improving. Patient has a history of gallstone and likely had a transient obstruction. MRCP  nonrevealing aside from iron deposition in the liver and spleen.      LOS: 4 days   Kashauna Celmer, Deloris Ping 10/19/2014, Pager [phone]

## 2014-10-19 NOTE — Progress Notes (Signed)
  Subjective: She is very unhappy about being NPO.  For MRCP and and possible EGD.  No lower abdominal pain.  No BM.   Objective: Vital signs in last 24 hours: Temp:  [97.6 F (36.4 C)-98.6 F (37 C)] 98.6 F (37 C) (04/22 0531) Pulse Rate:  [78-88] 78 (04/22 0531) Resp:  [16] 16 (04/22 0531) BP: (113-120)/(64-66) 120/66 mmHg (04/22 0531) SpO2:  [94 %-98 %] 94 % (04/22 0732) Last BM Date: 10/14/14  460 PO recorded;   No BM NPO going for MRCP today,      Intake/Output from previous day: 04/21 0701 - 04/22 0700 In: 1060 [P.O.:460; I.V.:550; IV Piggyback:50] Out: 600 [Urine:600] Intake/Output this shift:    General appearance: alert, cooperative, no distress and unhappy, mouth is dry. GI: soft, non-tender; bowel sounds normal; no masses,  no organomegaly  Lab Results:   Recent Labs  10/17/14 0510 10/18/14 0410  WBC 24.3* 23.6*  HGB 10.1* 9.8*  HCT 32.3* 31.5*  PLT 104* 115*    BMET  Recent Labs  10/17/14 0510 10/18/14 0410  NA 138 135  K 3.2* 3.4*  CL 106 104  CO2 25 24  GLUCOSE 133* 109*  BUN 8 8  CREATININE 0.59 0.67  CALCIUM 8.1* 7.9*   PT/INR No results for input(s): LABPROT, INR in the last 72 hours.   Recent Labs Lab 10/15/14 1500 10/16/14 0450 10/17/14 0510 10/19/14 0415  AST 18 310* 76* 40*  ALT 11 158* 162* 120*  ALKPHOS 70 147* 145* 198*  BILITOT 0.5 0.9 0.3 0.5  PROT 5.9* 5.4* 5.2* 5.6*  ALBUMIN 3.5 3.3* 3.1* 3.5     Lipase     Component Value Date/Time   LIPASE 18 10/19/2014 0415     Studies/Results: No results found.  Medications: . albuterol  2.5 mg Inhalation BID  . methylPREDNISolone (SOLU-MEDROL) injection  20 mg Intravenous Daily  . pantoprazole  40 mg Oral Daily  . piperacillin-tazobactam (ZOSYN)  IV  3.375 g Intravenous 3 times per day    Assessment/Plan   *Sigmoid diverticulitis with 2.4 cm abscess between bladder and sigmoid colon (site not amenable to percutaneous drain) Day 3.5 of Zosyn CLL Chronic  steroid use,  Recent lumbar fracture Arthritis Hx of anemia  Thrombocytopenia DVT: SCD only  Plan:  I think from our standpoint, we could advance diet and consider switching to PO antibiotics, for her diverticulitis.  Will defer to GI and follow with you.  LOS: 4 days    Bluma Buresh 10/19/2014

## 2014-10-19 NOTE — Progress Notes (Signed)
Echocardiogram 2D Echocardiogram has been performed.  Stefanie Henry 10/19/2014, 4:17 PM

## 2014-10-19 NOTE — Progress Notes (Signed)
PROGRESS NOTE  Rosalene Wardrop LHT:[id number] DOB: 04-28-1930 DOA: 10/15/2014 PCP: [name], MD  HPI: Stefanie Henry is a 81 y.o. female with history of CLL, asthma presents to the ER because of worsening abdominal pain, found to have sigmoid diverticulitis with contained perforation and abscess.  Subjective / 24 H Interval events - states that her breathing is about the same - upset that she is NPO - no chest pain  Assessment/Plan: Active Problems:   Chronic lymphocytic leukemia   Diverticulitis   Hyponatremia   Diverticulitis of large intestine with perforation and abscess without bleeding   Thrombocytopenia   Sigmoid diverticulitis with perforation/abscess - Patient presenting with a 2 week history of progressively worsening left lower quadrant abdominal pain - CT imaging showing sigmoid diverticulitis with contained perforation with abscess. - Gen. surgery consulted, continue IV antibiotics - based on further workup by GI switch on po antibiotics when no further testing planned and patient will not need to be NPO  Progressive dysphagia / early satiety  - GI consulted, patient underwent MRCP today (results below) - ? Whether she needs an EGD, defer to GI  Shortness of breath, improved - CXR with Mildly increased central pulmonary vascular congestion  - Lasix x 1 today, wean off oxygen as needed - 2D echo NOT DONE YET, ordered yesterday am  Hyponatremia - Like he secondary to hypotonic hypovolemic hyponatremia  - Improved after the administration of IV fluids  Elevated transaminases. - Patient's liver enzymes elevation, improving - MRCP as below  History of CLL - Patient on chronic steroid therapy - oncology following  Leukocytosis, anemia, thrombocytopenia  - in the setting of CLL - monitor with daily CBC while hospitalized.      Diet: Diet full liquid Room service appropriate?: Yes; Fluid consistency:: Thin Fluids: none DVT Prophylaxis: SCD  Code  Status: Full Code Family Communication: discussed with son over phone in the morning and bedside in the afternoon Disposition Plan: home when ready   Consultants:  General surgery   GI  Procedures:  None    Antibiotics  Anti-infectives    Start     Dose/Rate Route Frequency Ordered Stop   10/17/14 1800  fluconazole (DIFLUCAN) tablet 150 mg  Status:  Discontinued     150 mg Oral  Once 10/17/14 1707 10/17/14 1711   10/16/14 0245  piperacillin-tazobactam (ZOSYN) IVPB 3.375 g     3.375 g 12.5 mL/hr over 240 Minutes Intravenous 3 times per day 10/16/14 0238     10/15/14 2145  ciprofloxacin (CIPRO) IVPB 400 mg     400 mg 200 mL/hr over 60 Minutes Intravenous  Once 10/15/14 2137 10/15/14 2338   10/15/14 2145  metroNIDAZOLE (FLAGYL) IVPB 500 mg     500 mg 100 mL/hr over 60 Minutes Intravenous  Once 10/15/14 2137 10/16/14 0018      Studies 1. Ct Abdomen Pelvis Wo Contrast 10/15/2014 Colonic diverticulosis with evidence of mild inflammatory changes about the sigmoid colon most likely reflecting diverticulitis with a likely 2.4 cm contained perforation/small abscess between the sigmoid colon and bladder.   Electronically Signed   By: Logan Bores   On: 10/15/2014 21:06  2. Dg Chest 1 View 10/16/2014 Mildly increased central pulmonary vascular congestion is noted with possible minimal bilateral perihilar edema.   Electronically Signed   By: Marijo Conception, M.D.   On: 10/16/2014 15:48  3. MRCP 4/22 1. Cholelithiasis without evidence of acute cholecystitis, and without evidence of choledocholithiasis or biliary tract obstruction. 2.  Although the common bile duct is mildly dilated 8 mm in the porta hepatis, this is within normal limits for a patient this age, and there is no associated intrahepatic biliary ductal dilatation. 3. Extensive iron deposition throughout the liver and spleen, as above. 4. Additional incidental findings, as above.  Objective  Filed Vitals:   10/19/14 0531 10/19/14  0732 10/19/14 1403 10/19/14 1438  BP: 120/66  114/63 116/67  Pulse: 78  98 84  Temp: 98.6 F (37 C)  99 F (37.2 C) 99.6 F (37.6 C)  TempSrc: Oral  Oral Oral  Resp: 16  16 16   Height:      Weight:      SpO2: 98% 94% 97% 97%    Intake/Output Summary (Last 24 hours) at 10/19/14 1507 Last data filed at 10/19/14 1145  Gross per 24 hour  Intake    270 ml  Output      0 ml  Net    270 ml   Filed Weights   10/15/14 1345 10/16/14 0015  Weight: 56.7 kg (125 lb) 56.7 kg (125 lb)   Exam:  General: NAD   HEENT: no scleral icterus, PERRL   Cardiovascular: RRR without MRG, 2+ peripheral pulses, no edema   Respiratory: CTA biL, good air movement, no wheezing, no crackles   Abdomen: soft, non tender, BS +, no guarding   MSK/Extremities: no clubbing/cyanosis, no joint swelling   Skin: no rashes   Neuro: non focal  Data Reviewed: Basic Metabolic Panel:  Recent Labs Lab 10/15/14 1500 10/16/14 0450 10/17/14 0510 10/18/14 0410  NA 128* 135 138 135  K 4.0 3.6 3.2* 3.4*  CL 94* 100 106 104  CO2 26 27 25 24   GLUCOSE 184* 132* 133* 109*  BUN 10 6 8 8   CREATININE 0.60 0.59 0.59 0.67  CALCIUM 8.1* 8.0* 8.1* 7.9*   Liver Function Tests:  Recent Labs Lab 10/15/14 1500 10/16/14 0450 10/17/14 0510 10/19/14 0415  AST 18 310* 76* 40*  ALT 11 158* 162* 120*  ALKPHOS 70 147* 145* 198*  BILITOT 0.5 0.9 0.3 0.5  PROT 5.9* 5.4* 5.2* 5.6*  ALBUMIN 3.5 3.3* 3.1* 3.5    Recent Labs Lab 10/15/14 1500 10/19/14 0415  LIPASE 30 18   CBC:  Recent Labs Lab 10/15/14 1500 10/16/14 0450 10/17/14 0510 10/18/14 0410  WBC 32.1* 20.5* 24.3* 23.6*  NEUTROABS 3.2 2.5  --   --   HGB 10.8* 10.8* 10.1* 9.8*  HCT 34.0* 33.9* 32.3* 31.5*  MCV 71.6* 73.5* 73.2* 73.8*  PLT 122* 82* 104* 115*   ProBNP (last 3 results)  Recent Labs  10/05/14 1142  PROBNP 81.0   CBG:  Recent Labs Lab 10/18/14 1213 10/18/14 1637 10/18/14 2339 10/19/14 0603 10/19/14 1141  GLUCAP 129*  164* 114* 102* 114*   Recent Results (from the past 240 hour(s))  Urine culture     Status: None (Preliminary result)   Collection Time: 10/15/14  1:40 PM  Result Value Ref Range Status   Specimen Description URINE, CLEAN CATCH  Final   Special Requests NONE  Final   Colony Count   Final    25,000 COLONIES/ML Performed at [facility]    Culture   Final    GRAM NEGATIVE RODS Performed at [facility]    Report Status PENDING  Incomplete  Blood culture (routine x 2)     Status: None (Preliminary result)   Collection Time: 10/15/14  3:05 PM  Result Value Ref Range  Status   Specimen Description BLOOD RIGHT HAND  Final   Special Requests BOTTLES DRAWN AEROBIC AND ANAEROBIC 5CC EACH  Final   Culture   Final           BLOOD CULTURE RECEIVED NO GROWTH TO DATE CULTURE WILL BE HELD FOR 5 DAYS BEFORE ISSUING A FINAL NEGATIVE REPORT Performed at [facility]    Report Status PENDING  Incomplete  Blood culture (routine x 2)     Status: None (Preliminary result)   Collection Time: 10/15/14  3:05 PM  Result Value Ref Range Status   Specimen Description BLOOD RIGHT AC  Final   Special Requests BOTTLES DRAWN AEROBIC AND ANAEROBIC 5CC EACH  Final   Culture   Final           BLOOD CULTURE RECEIVED NO GROWTH TO DATE CULTURE WILL BE HELD FOR 5 DAYS BEFORE ISSUING A FINAL NEGATIVE REPORT Performed at [facility]    Report Status PENDING  Incomplete     Scheduled Meds: . albuterol  2.5 mg Inhalation BID  . methylPREDNISolone (SOLU-MEDROL) injection  20 mg Intravenous Daily  . pantoprazole  40 mg Oral Daily  . piperacillin-tazobactam (ZOSYN)  IV  3.375 g Intravenous 3 times per day    Continuous Infusions:    Marzetta Board, MD [facility] Pager [phone]. If 7 PM - 7 AM, please contact night-coverage at [email or web address], password [facility] 10/19/2014, 3:07 PM  LOS: 4 days

## 2014-10-20 LAB — COMPREHENSIVE METABOLIC PANEL
ALBUMIN: 3.2 g/dL — AB (ref 3.5–5.2)
ALK PHOS: 158 U/L — AB (ref 39–117)
ALT: 83 U/L — ABNORMAL HIGH (ref 0–35)
AST: 26 U/L (ref 0–37)
Anion gap: 11 (ref 5–15)
BUN: 5 mg/dL — ABNORMAL LOW (ref 6–23)
CO2: 26 mmol/L (ref 19–32)
Calcium: 8.7 mg/dL (ref 8.4–10.5)
Chloride: 101 mmol/L (ref 96–112)
Creatinine, Ser: 0.8 mg/dL (ref 0.50–1.10)
GFR calc Af Amer: 77 mL/min — ABNORMAL LOW (ref >90)
GFR calc non Af Amer: 66 mL/min — ABNORMAL LOW (ref >90)
Glucose, Bld: 139 mg/dL — ABNORMAL HIGH (ref 70–99)
POTASSIUM: 3.5 mmol/L (ref 3.5–5.1)
Sodium: 138 mmol/L (ref 135–145)
Total Bilirubin: 0.5 mg/dL (ref 0.3–1.2)
Total Protein: 5.3 g/dL — ABNORMAL LOW (ref 6.0–8.3)

## 2014-10-20 LAB — URINE CULTURE: Colony Count: 25000

## 2014-10-20 LAB — CBC
HCT: 35.4 % — ABNORMAL LOW (ref 36.0–46.0)
Hemoglobin: 11.1 g/dL — ABNORMAL LOW (ref 12.0–15.0)
MCH: 23 pg — AB (ref 26.0–34.0)
MCHC: 31.4 g/dL (ref 30.0–36.0)
MCV: 73.4 fL — ABNORMAL LOW (ref 78.0–100.0)
Platelets: 139 10*3/uL — ABNORMAL LOW (ref 150–400)
RBC: 4.82 MIL/uL (ref 3.87–5.11)
RDW: 14.2 % (ref 11.5–15.5)
WBC: 34.1 10*3/uL — ABNORMAL HIGH (ref 4.0–10.5)

## 2014-10-20 LAB — GLUCOSE, CAPILLARY
GLUCOSE-CAPILLARY: 104 mg/dL — AB (ref 70–99)
GLUCOSE-CAPILLARY: 128 mg/dL — AB (ref 70–99)
Glucose-Capillary: 113 mg/dL — ABNORMAL HIGH (ref 70–99)

## 2014-10-20 MED ORDER — CIPROFLOXACIN HCL 500 MG PO TABS
500.0000 mg | ORAL_TABLET | Freq: Two times a day (BID) | ORAL | Status: DC
  Administered 2014-10-20: 500 mg via ORAL
  Filled 2014-10-20 (×3): qty 1

## 2014-10-20 MED ORDER — METRONIDAZOLE 500 MG PO TABS
500.0000 mg | ORAL_TABLET | Freq: Three times a day (TID) | ORAL | Status: DC
  Administered 2014-10-20 (×2): 500 mg via ORAL
  Filled 2014-10-20 (×4): qty 1

## 2014-10-20 MED ORDER — METRONIDAZOLE 500 MG PO TABS: 500.0000 mg | ORAL_TABLET | Freq: Three times a day (TID) | ORAL | Status: DC

## 2014-10-20 MED ORDER — CIPROFLOXACIN HCL 500 MG PO TABS: 500.0000 mg | ORAL_TABLET | Freq: Two times a day (BID) | ORAL | Status: DC

## 2014-10-20 NOTE — Discharge Summary (Signed)
Reviewed discharge instructions with pt and her son including follow-up appointments, medications, and precautions.  Son spoke with MD with some additional instructions and also discussed rash on pt's feet with MD.  Stefanie Henry then verbalized understanding of all instructions.  Pt had no questions.  Pt being d/c to home into care of son.

## 2014-10-20 NOTE — Progress Notes (Signed)
General Surgery Note  LOS: 5 days  POD -     Assessment/Plan: 1.  Diverticulitis  On Cipro/Flagyl  Abdomen benign.  Okay for discharge from our standpoint.  2. Gall stones  MRI - 10/19/2014 - cholelithiasis, no evidence of cholecystitis or choledocholithiasis  3. Chronic lymphocytic leukemia Treated by Dr. Pearletha Alfred  WBC - 34,100 - 10/20/2014 4. History of asthma 5. Arthritis 6. On steroids - Solumedrol 20 mg/day 7. Thrombocytopenia - 139,000 - 10/20/2014 8. 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  Mildly elevated LFT's 10.  DVT prophylaxis - on SCD only   Active Problems:   Chronic lymphocytic leukemia   Diverticulitis   Hyponatremia   Diverticulitis of large intestine with perforation and abscess without bleeding   Thrombocytopenia   Abnormal LFTs (liver function tests)   Dysphagia  Subjective:  Doing well.  Language is a barrier.  The son has called, though I have not spoken to him. Objective:   Filed Vitals:   10/20/14 0600  BP: 125/63  Pulse: 81  Temp: 98.8 F (37.1 C)  Resp: 16     Intake/Output from previous day:  04/22 0701 - 04/23 0700 In: 240 [P.O.:240] Out: -   Intake/Output this shift:      Physical Exam:   General: Older [place] F who is alert and oriented.    HEENT: Normal. Pupils equal. .   Lungs: Clear   Abdomen: Soft.  BS present.   Lab Results:    Recent Labs  10/18/14 0410 10/20/14 0441  WBC 23.6* 34.1*  HGB 9.8* 11.1*  HCT 31.5* 35.4*  PLT 115* 139*    BMET   Recent Labs  10/18/14 0410 10/20/14 0441  NA 135 138  K 3.4* 3.5  CL 104 101  CO2 24 26  GLUCOSE 109* 139*  BUN 8 5*  CREATININE 0.67 0.80  CALCIUM 7.9* 8.7    PT/INR  No results for input(s): LABPROT, INR in the last 72 hours.  ABG  No results for input(s): PHART, HCO3 in the last 72 hours.  Invalid input(s): PCO2, PO2   Studies/Results:  Mr 3d Recon At Scanner  10/19/2014   CLINICAL DATA:  81 year old female with elevated liver function  tests with nausea and vomiting. Evaluate for cholelithiasis or choledocholithiasis.  EXAM: MRI ABDOMEN WITHOUT AND WITH CONTRAST (INCLUDING MRCP)  TECHNIQUE: Multiplanar multisequence MR imaging of the abdomen was performed both before and after the administration of intravenous contrast. Heavily T2-weighted images of the biliary and pancreatic ducts were obtained, and three-dimensional MRCP images were rendered by post processing.  CONTRAST:  77mL MULTIHANCE GADOBENATE DIMEGLUMINE 529 MG/ML IV SOLN  COMPARISON:  CT of the abdomen and pelvis 10/15/2014.  FINDINGS: Lower chest:  Unremarkable.  Hepatobiliary: Diffuse loss of signal intensity throughout the hepatic parenchyma on in of phase dual echo images, and diffuse low T2 signal intensity throughout the hepatic parenchyma, compatible with iron deposition. No cystic or solid hepatic lesions. MRCP images demonstrate no intrahepatic biliary ductal dilatation. Extrahepatic bile duct is prominent, with the common bile duct measuring up to 8 mm in the porta hepatis, within normal limits for patient of this age. Gallbladder is contracted around numerous gallstones and measure up to 1.4 cm in diameter. No definite filling defect within the common bile duct to suggest the presence of a ductal stone.  Pancreas: No pancreatic mass. Pancreatic duct is normal in caliber on MRCP images. No pancreatic or peripancreatic fluid or inflammatory changes.  Spleen: Loss of signal intensity throughout  the splenic parenchyma on in phase dual echo images, and low T2 signal intensity throughout the spleen, also compatible with iron deposition.  Adrenals/Urinary Tract: Bilateral adrenal glands are normal in appearance. Several small T1 hypointense, T2 hyperintense, nonenhancing lesions are noted in the kidneys bilaterally, largest of which measures 4 mm in the interpolar region of the right kidney, compatible with small simple cysts. In addition, in the interpolar region of the right kidney  there is a 5 mm T1 hyperintense lesion which is poorly visualized on the other pulse sequences secondary to motion, favored to represent a small proteinaceous/ hemorrhagic cyst. No hydroureteronephrosis in the visualized portions of the abdomen.  Stomach/Bowel: Unremarkable.  Vascular/Lymphatic: No aneurysm identified in the visualized abdominal vasculature.  Other: No significant volume of ascites in the visualized abdomen.  Musculoskeletal: No aggressive appearing osseous lesion noted in the visualized portions of the skeleton.  IMPRESSION: 1. Cholelithiasis without evidence of acute cholecystitis, and without evidence of choledocholithiasis or biliary tract obstruction. 2. Although the common bile duct is mildly dilated 8 mm in the porta hepatis, this is within normal limits for a patient this age, and there is no associated intrahepatic biliary ductal dilatation. 3. Extensive iron deposition throughout the liver and spleen, as above. 4. Additional incidental findings, as above.   Electronically Signed   By: Vinnie Langton M.D.   On: 10/19/2014 11:56   Mr Jeananne Rama W/wo Cm/mrcp  10/19/2014   CLINICAL DATA:  81 year old female with elevated liver function tests with nausea and vomiting. Evaluate for cholelithiasis or choledocholithiasis.  EXAM: MRI ABDOMEN WITHOUT AND WITH CONTRAST (INCLUDING MRCP)  TECHNIQUE: Multiplanar multisequence MR imaging of the abdomen was performed both before and after the administration of intravenous contrast. Heavily T2-weighted images of the biliary and pancreatic ducts were obtained, and three-dimensional MRCP images were rendered by post processing.  CONTRAST:  32mL MULTIHANCE GADOBENATE DIMEGLUMINE 529 MG/ML IV SOLN  COMPARISON:  CT of the abdomen and pelvis 10/15/2014.  FINDINGS: Lower chest:  Unremarkable.  Hepatobiliary: Diffuse loss of signal intensity throughout the hepatic parenchyma on in of phase dual echo images, and diffuse low T2 signal intensity throughout the hepatic  parenchyma, compatible with iron deposition. No cystic or solid hepatic lesions. MRCP images demonstrate no intrahepatic biliary ductal dilatation. Extrahepatic bile duct is prominent, with the common bile duct measuring up to 8 mm in the porta hepatis, within normal limits for patient of this age. Gallbladder is contracted around numerous gallstones and measure up to 1.4 cm in diameter. No definite filling defect within the common bile duct to suggest the presence of a ductal stone.  Pancreas: No pancreatic mass. Pancreatic duct is normal in caliber on MRCP images. No pancreatic or peripancreatic fluid or inflammatory changes.  Spleen: Loss of signal intensity throughout the splenic parenchyma on in phase dual echo images, and low T2 signal intensity throughout the spleen, also compatible with iron deposition.  Adrenals/Urinary Tract: Bilateral adrenal glands are normal in appearance. Several small T1 hypointense, T2 hyperintense, nonenhancing lesions are noted in the kidneys bilaterally, largest of which measures 4 mm in the interpolar region of the right kidney, compatible with small simple cysts. In addition, in the interpolar region of the right kidney there is a 5 mm T1 hyperintense lesion which is poorly visualized on the other pulse sequences secondary to motion, favored to represent a small proteinaceous/ hemorrhagic cyst. No hydroureteronephrosis in the visualized portions of the abdomen.  Stomach/Bowel: Unremarkable.  Vascular/Lymphatic: No aneurysm identified in the visualized  abdominal vasculature.  Other: No significant volume of ascites in the visualized abdomen.  Musculoskeletal: No aggressive appearing osseous lesion noted in the visualized portions of the skeleton.  IMPRESSION: 1. Cholelithiasis without evidence of acute cholecystitis, and without evidence of choledocholithiasis or biliary tract obstruction. 2. Although the common bile duct is mildly dilated 8 mm in the porta hepatis, this is  within normal limits for a patient this age, and there is no associated intrahepatic biliary ductal dilatation. 3. Extensive iron deposition throughout the liver and spleen, as above. 4. Additional incidental findings, as above.   Electronically Signed   By: Vinnie Langton M.D.   On: 10/19/2014 11:56     Anti-infectives:   Anti-infectives    Start     Dose/Rate Route Frequency Ordered Stop   10/20/14 0900  ciprofloxacin (CIPRO) tablet 500 mg     500 mg Oral 2 times daily 10/20/14 0741     10/20/14 0900  metroNIDAZOLE (FLAGYL) tablet 500 mg     500 mg Oral 3 times per day 10/20/14 0741     10/17/14 1800  fluconazole (DIFLUCAN) tablet 150 mg  Status:  Discontinued     150 mg Oral  Once 10/17/14 1707 10/17/14 1711   10/16/14 0245  piperacillin-tazobactam (ZOSYN) IVPB 3.375 g  Status:  Discontinued     3.375 g 12.5 mL/hr over 240 Minutes Intravenous 3 times per day 10/16/14 0238 10/20/14 0741   10/15/14 2145  ciprofloxacin (CIPRO) IVPB 400 mg     400 mg 200 mL/hr over 60 Minutes Intravenous  Once 10/15/14 2137 10/15/14 2338   10/15/14 2145  metroNIDAZOLE (FLAGYL) IVPB 500 mg     500 mg 100 mL/hr over 60 Minutes Intravenous  Once 10/15/14 2137 10/16/14 0018      Alphonsa Overall, MD, FACS Pager: [facility] Office: [phone] 10/20/2014

## 2014-10-20 NOTE — Discharge Summary (Signed)
Physician Discharge Summary  Stefanie Henry GSU:[id number] DOB: 1930/04/22 DOA: 10/15/2014  PCP: [name], MD  Admit date: 10/15/2014 Discharge date: 10/20/2014  Time spent: > 35 minutes  Recommendations for Outpatient Follow-up:  1. Follow up with GI as an outpatient to complete workup 2. Follow up with Dr. [name] as scheduled   Discharge Diagnoses:  Active Problems:   Chronic lymphocytic leukemia   Diverticulitis   Hyponatremia   Diverticulitis of large intestine with perforation and abscess without bleeding   Thrombocytopenia   Abnormal LFTs (liver function tests)   Dysphagia  Discharge Condition: stable  Diet recommendation: regular   Filed Weights   10/15/14 1345 10/16/14 0015  Weight: 56.7 kg (125 lb) 56.7 kg (125 lb)    History of present illness:  Stefanie Henry is a 81 y.o. female with history of CLL, asthma presents to the ER because of worsening abdominal pain. Patient states over the last 15 days patient has been abdominal pain mostly in left lower quadrant. Patient denies any associated nausea vomiting or diarrhea. Has had some fever chills or week ago. Since the pain was worsening patient came to the ER. CT of the abdomen and pelvis shows left lower quadrant sigmoid diverticulitis with contained perforation and abscess. On-call surgeon Dr. Hassell Done was notified and at this time patient has been admitted for further management. Patient states the pain happens episodically when becomes severe at times. Denies any shortness of breath or chest pain.   Hospital Course:  Sigmoid diverticulitis with perforation/abscess - Patient presenting with a 2 week history of progressively worsening left lower quadrant abdominal pain - CT imaging showing sigmoid diverticulitis with contained perforation with abscess. - Gen. surgery consulted and have followed patient while hospitalized.  - patient improving, her antibiotics were switched to oral, she was tolerating it well, no  nausea/vomiting or abdominal pain.  - her diet was advanced per surgery and patient is tolerating a regular diet on day of discharge  Progressive dysphagia / early satiety  - GI consulted, patient underwent MRCP (results below) - GI will defer the rest of her workup as an outpatient  Shortness of breath, improved - CXR with Mildly increased central pulmonary vascular congestion  - Lasix x 1, weaned off oxygen to room air - 2D echo as below - stable on discharge, this is chronic  Hyponatremia - Like he secondary to hypotonic hypovolemic hyponatremia  - Improved after the administration of IV fluids  Elevated transaminases. - Patient's liver enzymes elevation, improving - MRCP as below  History of CLL - Patient on chronic steroid therapy - oncology following  Leukocytosis, anemia, thrombocytopenia  - in the setting of CLL - monitor with daily CBC while hospitalized.   Procedures:  2d Echo Study Conclusions Left ventricle: The cavity size was normal. Wall thickness was increased in a pattern of mild LVH. Systolic function was normal.The estimated ejection fraction was in the range of 50% to 55%.Wall motion was normal; there were no regional wall motionabnormalities. Doppler parameters are consistent with abnormal left ventricular relaxation (grade 1 diastolic dysfunction).    Consultations:  GI  General surgery   Discharge Exam: Filed Vitals:   10/19/14 2139 10/19/14 2230 10/20/14 0600 10/20/14 0839  BP:  131/58 125/63   Pulse:  91 81   Temp:  97.5 F (36.4 C) 98.8 F (37.1 C)   TempSrc:  Oral Oral   Resp:  16 16   Height:      Weight:  SpO2: 95% 97% 98% 97%    General: NAD Cardiovascular: RRR Respiratory: CTA biL  Discharge Instructions     Medication List    TAKE these medications        albuterol 108 (90 BASE) MCG/ACT inhaler  Commonly known as:  PROVENTIL HFA;VENTOLIN HFA  Inhale 1-2 puffs into the lungs every 6 (six) hours as needed for  wheezing or shortness of breath.     calcitonin (salmon) 200 UNIT/ACT nasal spray  Commonly known as:  MIACALCIN/FORTICAL  Place 1 spray into alternate nostrils daily.     ciprofloxacin 500 MG tablet  Commonly known as:  CIPRO  Take 1 tablet (500 mg total) by mouth 2 (two) times daily.     famotidine 20 MG tablet  Commonly known as:  PEPCID  Take 1 tablet (20 mg total) by mouth daily.     gabapentin 100 MG capsule  Commonly known as:  NEURONTIN  Take 1 capsule (100 mg total) by mouth daily.     GAS-X PO  Take 2 capsules by mouth daily.     metroNIDAZOLE 500 MG tablet  Commonly known as:  FLAGYL  Take 1 tablet (500 mg total) by mouth every 8 (eight) hours.     pantoprazole 40 MG tablet  Commonly known as:  PROTONIX  Take 1 tablet (40 mg total) by mouth daily. Take 30-60 min before first meal of the day     predniSONE 10 MG tablet  Commonly known as:  DELTASONE  TAKE 1 TABLET BY MOUTH ONCE DAILY     predniSONE 10 MG tablet  Commonly known as:  DELTASONE  TAKE 1 TABLET BY MOUTH ONCE DAILY           Follow-up Information    Follow up with Nicoletta Ba, PA-C On 10/30/2014.   Specialty:  Gastroenterology   Why:  Appt at 2:00, please be at office for 1:45.   Contact information:   [address] [place] [address] [phone]       Follow up with [name], MD. Schedule an appointment as soon as possible for a visit in 2 weeks.   Specialty:  Oncology   Contact information:   [address], [address] [place] [address] [phone]       The results of significant diagnostics from this hospitalization (including imaging, microbiology, ancillary and laboratory) are listed below for reference.    Significant Diagnostic Studies: Ct Abdomen Pelvis Wo Contrast  10/15/2014   CLINICAL DATA:  Lower abdominal pain for 10-15 days.  EXAM: CT ABDOMEN AND PELVIS WITHOUT CONTRAST  TECHNIQUE: Multidetector CT imaging of the abdomen and pelvis was performed  following the standard protocol without IV contrast.  COMPARISON:  Abdominal ultrasound 07/17/2014. CT abdomen and pelvis 06/08/2014.  FINDINGS: Mild scarring is noted in the lung bases, most notably in the right middle lobe and similar to the prior CT.  The spleen is again noted to be mildly enlarged. Gallstones demonstrated on the prior ultrasound are not clearly seen by CT. The liver, adrenal glands, left kidney, and pancreas have an unremarkable unenhanced appearance. 10 mm right renal cyst is unchanged.  Oral contrast is present in loops of nondilated small and large bowel to the level of the proximal transverse colon without evidence of obstruction. There is some soft tissue stranding/thickening along the sigmoid colon with suggestion of a degree of sigmoid colonic wall thickening, although evaluation is partially limited by underdistention. Between the sigmoid colon and bladder is  a 2.4 x 1.7 cm low-density focus with a locule of gas suspicious for a small fluid collection. There is asymmetric, smooth bladder wall thickening in this region likely secondary to adjacent inflammation. Left-sided colonic diverticulosis is again seen. Appendix is unremarkable.  Calcified uterine fibroids are again seen. Mild atherosclerotic vascular calcification is noted. No free fluid or enlarged lymph nodes are identified. Chronic L1 compression fracture is again noted as well as chronic fusion across the L5-S1 disc space. SI joint degenerative changes are also noted as well as lower lumbar facet arthrosis.  IMPRESSION: Colonic diverticulosis with evidence of mild inflammatory changes about the sigmoid colon most likely reflecting diverticulitis with a likely 2.4 cm contained perforation/small abscess between the sigmoid colon and bladder.   Electronically Signed   By: Logan Bores   On: 10/15/2014 21:06   Dg Chest 1 View  10/16/2014   CLINICAL DATA:  Acute shortness of breath.  EXAM: CHEST  1 VIEW  COMPARISON:  October 05, 2014.  FINDINGS: Stable cardiomediastinal silhouette. No pneumothorax or pleural effusion is noted. Mildly increased central pulmonary vascular congestion is noted minimal bilateral perihilar edema may be present. No significant osseous abnormality is noted.  IMPRESSION: Mildly increased central pulmonary vascular congestion is noted with possible minimal bilateral perihilar edema.   Electronically Signed   By: Marijo Conception, M.D.   On: 10/16/2014 15:48   Dg Chest 2 View  10/05/2014   CLINICAL DATA:  Dyspnea for 3 months.  History of asthma.  EXAM: CHEST  2 VIEW  COMPARISON:  12/13/2011  FINDINGS: There is chronic right middle lobe scarring. There is no focal parenchymal opacity, pleural effusion, or pneumothorax. There is stable cardiomegaly.  The osseous structures are unremarkable.  IMPRESSION: No active cardiopulmonary disease.   Electronically Signed   By: Kathreen Devoid   On: 10/05/2014 13:13   Mr 3d Recon At Scanner  10/19/2014   CLINICAL DATA:  81 year old female with elevated liver function tests with nausea and vomiting. Evaluate for cholelithiasis or choledocholithiasis.  EXAM: MRI ABDOMEN WITHOUT AND WITH CONTRAST (INCLUDING MRCP)  TECHNIQUE: Multiplanar multisequence MR imaging of the abdomen was performed both before and after the administration of intravenous contrast. Heavily T2-weighted images of the biliary and pancreatic ducts were obtained, and three-dimensional MRCP images were rendered by post processing.  CONTRAST:  96mL MULTIHANCE GADOBENATE DIMEGLUMINE 529 MG/ML IV SOLN  COMPARISON:  CT of the abdomen and pelvis 10/15/2014.  FINDINGS: Lower chest:  Unremarkable.  Hepatobiliary: Diffuse loss of signal intensity throughout the hepatic parenchyma on in of phase dual echo images, and diffuse low T2 signal intensity throughout the hepatic parenchyma, compatible with iron deposition. No cystic or solid hepatic lesions. MRCP images demonstrate no intrahepatic biliary ductal dilatation.  Extrahepatic bile duct is prominent, with the common bile duct measuring up to 8 mm in the porta hepatis, within normal limits for patient of this age. Gallbladder is contracted around numerous gallstones and measure up to 1.4 cm in diameter. No definite filling defect within the common bile duct to suggest the presence of a ductal stone.  Pancreas: No pancreatic mass. Pancreatic duct is normal in caliber on MRCP images. No pancreatic or peripancreatic fluid or inflammatory changes.  Spleen: Loss of signal intensity throughout the splenic parenchyma on in phase dual echo images, and low T2 signal intensity throughout the spleen, also compatible with iron deposition.  Adrenals/Urinary Tract: Bilateral adrenal glands are normal in appearance. Several small T1 hypointense, T2 hyperintense, nonenhancing lesions  are noted in the kidneys bilaterally, largest of which measures 4 mm in the interpolar region of the right kidney, compatible with small simple cysts. In addition, in the interpolar region of the right kidney there is a 5 mm T1 hyperintense lesion which is poorly visualized on the other pulse sequences secondary to motion, favored to represent a small proteinaceous/ hemorrhagic cyst. No hydroureteronephrosis in the visualized portions of the abdomen.  Stomach/Bowel: Unremarkable.  Vascular/Lymphatic: No aneurysm identified in the visualized abdominal vasculature.  Other: No significant volume of ascites in the visualized abdomen.  Musculoskeletal: No aggressive appearing osseous lesion noted in the visualized portions of the skeleton.  IMPRESSION: 1. Cholelithiasis without evidence of acute cholecystitis, and without evidence of choledocholithiasis or biliary tract obstruction. 2. Although the common bile duct is mildly dilated 8 mm in the porta hepatis, this is within normal limits for a patient this age, and there is no associated intrahepatic biliary ductal dilatation. 3. Extensive iron deposition throughout  the liver and spleen, as above. 4. Additional incidental findings, as above.   Electronically Signed   By: Vinnie Langton M.D.   On: 10/19/2014 11:56   Mr Jeananne Rama W/wo Cm/mrcp  10/19/2014   CLINICAL DATA:  81 year old female with elevated liver function tests with nausea and vomiting. Evaluate for cholelithiasis or choledocholithiasis.  EXAM: MRI ABDOMEN WITHOUT AND WITH CONTRAST (INCLUDING MRCP)  TECHNIQUE: Multiplanar multisequence MR imaging of the abdomen was performed both before and after the administration of intravenous contrast. Heavily T2-weighted images of the biliary and pancreatic ducts were obtained, and three-dimensional MRCP images were rendered by post processing.  CONTRAST:  8mL MULTIHANCE GADOBENATE DIMEGLUMINE 529 MG/ML IV SOLN  COMPARISON:  CT of the abdomen and pelvis 10/15/2014.  FINDINGS: Lower chest:  Unremarkable.  Hepatobiliary: Diffuse loss of signal intensity throughout the hepatic parenchyma on in of phase dual echo images, and diffuse low T2 signal intensity throughout the hepatic parenchyma, compatible with iron deposition. No cystic or solid hepatic lesions. MRCP images demonstrate no intrahepatic biliary ductal dilatation. Extrahepatic bile duct is prominent, with the common bile duct measuring up to 8 mm in the porta hepatis, within normal limits for patient of this age. Gallbladder is contracted around numerous gallstones and measure up to 1.4 cm in diameter. No definite filling defect within the common bile duct to suggest the presence of a ductal stone.  Pancreas: No pancreatic mass. Pancreatic duct is normal in caliber on MRCP images. No pancreatic or peripancreatic fluid or inflammatory changes.  Spleen: Loss of signal intensity throughout the splenic parenchyma on in phase dual echo images, and low T2 signal intensity throughout the spleen, also compatible with iron deposition.  Adrenals/Urinary Tract: Bilateral adrenal glands are normal in appearance. Several small T1  hypointense, T2 hyperintense, nonenhancing lesions are noted in the kidneys bilaterally, largest of which measures 4 mm in the interpolar region of the right kidney, compatible with small simple cysts. In addition, in the interpolar region of the right kidney there is a 5 mm T1 hyperintense lesion which is poorly visualized on the other pulse sequences secondary to motion, favored to represent a small proteinaceous/ hemorrhagic cyst. No hydroureteronephrosis in the visualized portions of the abdomen.  Stomach/Bowel: Unremarkable.  Vascular/Lymphatic: No aneurysm identified in the visualized abdominal vasculature.  Other: No significant volume of ascites in the visualized abdomen.  Musculoskeletal: No aggressive appearing osseous lesion noted in the visualized portions of the skeleton.  IMPRESSION: 1. Cholelithiasis without evidence of acute cholecystitis, and without evidence  of choledocholithiasis or biliary tract obstruction. 2. Although the common bile duct is mildly dilated 8 mm in the porta hepatis, this is within normal limits for a patient this age, and there is no associated intrahepatic biliary ductal dilatation. 3. Extensive iron deposition throughout the liver and spleen, as above. 4. Additional incidental findings, as above.   Electronically Signed   By: Vinnie Langton M.D.   On: 10/19/2014 11:56    Microbiology: Recent Results (from the past 240 hour(s))  Urine culture     Status: None   Collection Time: 10/15/14  1:40 PM  Result Value Ref Range Status   Specimen Description URINE, CLEAN CATCH  Final   Special Requests NONE  Final   Colony Count   Final    25,000 COLONIES/ML Performed at [facility]    Culture   Final    ENTEROBACTER CLOACAE Performed at [facility]    Report Status 10/20/2014 FINAL  Final   Organism ID, Bacteria ENTEROBACTER CLOACAE  Final      Susceptibility   Enterobacter cloacae - MIC*    CEFAZOLIN >=64 RESISTANT Resistant     CEFTRIAXONE  <=1 SENSITIVE Sensitive     CIPROFLOXACIN <=0.25 SENSITIVE Sensitive     GENTAMICIN <=1 SENSITIVE Sensitive     LEVOFLOXACIN <=0.12 SENSITIVE Sensitive     NITROFURANTOIN 64 INTERMEDIATE Intermediate     TOBRAMYCIN <=1 SENSITIVE Sensitive     TRIMETH/SULFA <=20 SENSITIVE Sensitive     PIP/TAZO <=4 SENSITIVE Sensitive     * ENTEROBACTER CLOACAE  Blood culture (routine x 2)     Status: None (Preliminary result)   Collection Time: 10/15/14  3:05 PM  Result Value Ref Range Status   Specimen Description BLOOD RIGHT HAND  Final   Special Requests BOTTLES DRAWN AEROBIC AND ANAEROBIC 5CC EACH  Final   Culture   Final           BLOOD CULTURE RECEIVED NO GROWTH TO DATE CULTURE WILL BE HELD FOR 5 DAYS BEFORE ISSUING A FINAL NEGATIVE REPORT Performed at [facility]    Report Status PENDING  Incomplete  Blood culture (routine x 2)     Status: None (Preliminary result)   Collection Time: 10/15/14  3:05 PM  Result Value Ref Range Status   Specimen Description BLOOD RIGHT AC  Final   Special Requests BOTTLES DRAWN AEROBIC AND ANAEROBIC 5CC EACH  Final   Culture   Final           BLOOD CULTURE RECEIVED NO GROWTH TO DATE CULTURE WILL BE HELD FOR 5 DAYS BEFORE ISSUING A FINAL NEGATIVE REPORT Performed at [facility]    Report Status PENDING  Incomplete     Labs: Basic Metabolic Panel:  Recent Labs Lab 10/15/14 1500 10/16/14 0450 10/17/14 0510 10/18/14 0410 10/20/14 0441  NA 128* 135 138 135 138  K 4.0 3.6 3.2* 3.4* 3.5  CL 94* 100 106 104 101  CO2 26 27 25 24 26   GLUCOSE 184* 132* 133* 109* 139*  BUN 10 6 8 8  5*  CREATININE 0.60 0.59 0.59 0.67 0.80  CALCIUM 8.1* 8.0* 8.1* 7.9* 8.7   Liver Function Tests:  Recent Labs Lab 10/15/14 1500 10/16/14 0450 10/17/14 0510 10/19/14 0415 10/20/14 0441  AST 18 310* 76* 40* 26  ALT 11 158* 162* 120* 83*  ALKPHOS 70 147* 145* 198* 158*  BILITOT 0.5 0.9 0.3 0.5 0.5  PROT 5.9* 5.4* 5.2* 5.6* 5.3*  ALBUMIN 3.5 3.3*  3.1*  3.5 3.2*    Recent Labs Lab 10/15/14 1500 10/19/14 0415  LIPASE 30 18   CBC:  Recent Labs Lab 10/15/14 1500 10/16/14 0450 10/17/14 0510 10/18/14 0410 10/20/14 0441  WBC 32.1* 20.5* 24.3* 23.6* 34.1*  NEUTROABS 3.2 2.5  --   --   --   HGB 10.8* 10.8* 10.1* 9.8* 11.1*  HCT 34.0* 33.9* 32.3* 31.5* 35.4*  MCV 71.6* 73.5* 73.2* 73.8* 73.4*  PLT 122* 82* 104* 115* 139*   ProBNP (last 3 results)  Recent Labs  10/05/14 1142  PROBNP 81.0    CBG:  Recent Labs Lab 10/19/14 0603 10/19/14 1141 10/20/14 0040 10/20/14 0533 10/20/14 1244  GLUCAP 102* 114* 113* 104* 128*    Signed:  Marzetta Board  [facility] 10/20/2014, 6:34 PM

## 2014-10-21 LAB — CULTURE, BLOOD (ROUTINE X 2)
Culture: NO GROWTH
Culture: NO GROWTH

## 2014-10-24 NOTE — Progress Notes (Signed)
Contacted by family due to N/V and questionable rash on neck.  Pt also having abdominal discomfort.  I told son that pt needs to be evaluated by physician.  She either needs to go to urgent care or go to ED.  Son expressed understanding  DTat

## 2014-10-30 ENCOUNTER — Encounter: Payer: Self-pay | Admitting: Physician Assistant

## 2014-10-30 ENCOUNTER — Other Ambulatory Visit (INDEPENDENT_AMBULATORY_CARE_PROVIDER_SITE_OTHER): Payer: Medicare Other

## 2014-10-30 ENCOUNTER — Ambulatory Visit (INDEPENDENT_AMBULATORY_CARE_PROVIDER_SITE_OTHER): Payer: Medicare Other | Admitting: Physician Assistant

## 2014-10-30 VITALS — BP 84/56 | HR 88 | Ht <= 58 in | Wt 125.2 lb

## 2014-10-30 DIAGNOSIS — R112 Nausea with vomiting, unspecified: Secondary | ICD-10-CM | POA: Diagnosis not present

## 2014-10-30 DIAGNOSIS — K5792 Diverticulitis of intestine, part unspecified, without perforation or abscess without bleeding: Secondary | ICD-10-CM

## 2014-10-30 LAB — COMPREHENSIVE METABOLIC PANEL
ALT: 13 U/L (ref 0–35)
AST: 13 U/L (ref 0–37)
Albumin: 3.6 g/dL (ref 3.5–5.2)
Alkaline Phosphatase: 76 U/L (ref 39–117)
BUN: 5 mg/dL — ABNORMAL LOW (ref 6–23)
CALCIUM: 8.5 mg/dL (ref 8.4–10.5)
CHLORIDE: 95 meq/L — AB (ref 96–112)
CO2: 29 mEq/L (ref 19–32)
Creatinine, Ser: 0.63 mg/dL (ref 0.40–1.20)
GFR: 95.85 mL/min (ref >60.00)
Glucose, Bld: 99 mg/dL (ref 70–99)
POTASSIUM: 3.8 meq/L (ref 3.5–5.1)
SODIUM: 129 meq/L — AB (ref 135–145)
Total Bilirubin: 0.5 mg/dL (ref 0.2–1.2)
Total Protein: 5.6 g/dL — ABNORMAL LOW (ref 6.0–8.3)

## 2014-10-30 LAB — CBC WITH DIFFERENTIAL/PLATELET
BASOS PCT: 0.4 % (ref 0.0–3.0)
Basophils Absolute: 0.1 10*3/uL (ref 0.0–0.1)
EOS ABS: 0.1 10*3/uL (ref 0.0–0.7)
Eosinophils Relative: 0.3 % (ref 0.0–5.0)
HCT: 35.4 % — ABNORMAL LOW (ref 36.0–46.0)
HEMOGLOBIN: 11.3 g/dL — AB (ref 12.0–15.0)
LYMPHS ABS: 25.2 10*3/uL — AB (ref 0.7–4.0)
Lymphocytes Relative: 84.9 % — ABNORMAL HIGH (ref 12.0–46.0)
MCHC: 31.9 g/dL (ref 30.0–36.0)
MCV: 71.3 fl — ABNORMAL LOW (ref 78.0–100.0)
Monocytes Absolute: 0.9 10*3/uL (ref 0.1–1.0)
Monocytes Relative: 3 % (ref 3.0–12.0)
NEUTROS ABS: 3.4 10*3/uL (ref 1.4–7.7)
Neutrophils Relative %: 11.4 % — ABNORMAL LOW (ref 43.0–77.0)
Platelets: 123 10*3/uL — ABNORMAL LOW (ref 150.0–400.0)
RBC: 4.97 Mil/uL (ref 3.87–5.11)
RDW: 13.9 % (ref 11.5–15.5)
WBC: 29.6 10*3/uL (ref 4.0–10.5)

## 2014-10-30 NOTE — Patient Instructions (Signed)
We sent a prescription for Zofran 4 mg to [facility] [address], [place], [place].   You have been scheduled for a CT scan of the abdomen and pelvis at [facility]. Go to registration.  You are scheduled on Wednesday 10-31-2014 at  7:30 am You should arrive at 7:15 am to your appointment time for registration. Please follow the written instructions below on the day of your exam:  1) Do not eat or drink anything after 3:30 am   (4 hours prior to your test) 2) You have been given 2 bottles of oral contrast to drink. The solution may taste               better if refrigerated, but do NOT add ice or any other liquid to this solution. Shake             well before drinking.    Drink 1 bottle of contrast @  5:30 am (2 hours prior to your exam)  Drink 1 bottle of contrast @  6:30 am  (1 hour prior to your exam)  You may take any medications as prescribed with a small amount of water except for the following: Metformin, Glucophage, Glucovance, Avandamet, Riomet, Fortamet, Actoplus Met, Janumet, Glumetza or Metaglip. The above medications must be held the day of the exam AND 48 hours after the exam.  The purpose of you drinking the oral contrast is to aid in the visualization of your intestinal tract. The contrast solution may cause some diarrhea. Before your exam is started, you will be given a small amount of fluid to drink. Depending on your individual set of symptoms, you may also receive an intravenous injection of x-ray contrast/dye. Plan on being at [facility] for 30 minutes or long, depending on the type of exam you are having performed.  If you have any questions regarding your exam or if you need to reschedule, you may call the CT department at [phone] between the hours of 8:00 am and 5:00 pm, Monday-Friday.  ________________________________________________________________________

## 2014-10-30 NOTE — Progress Notes (Signed)
Patient ID: Kenisha Lynds, female   DOB: December 28, 1929, 81 y.o.   MRN: [id number]   Subjective:    Patient ID: [name], female    DOB: December 07, 1929, 81 y.o.   MRN: [id number]  HPI Jaeline is an 81 year old [place] female known to Dr. Fuller Plan from prior evaluation in 2015 at which time she was identified as having cholelithiasis and possible chronic cholecystitis but declined surgery. She seen today in follow-up after recent hospitalization. She has history of chronic lymphocytic leukemia for which she also declined aggressive management and is just on symptomatic treatment. He is followed by Dr. [name]. Patient was hospitalized for 19 through 10/20/2014 abdominal pain and was found on CT scan to have acute diverticulitis with a contained perforation and and abscess. She had some soft tissue stranding and thickening along the sigmoid colon with suggestion of a degree of sigmoid colonic wall thickening there was under distention and no IV contrast. Between the sigmoid colon and the bladder was a 2.4 x 1.7 cm abscess. She was also noted to have multiple gallstones. She was treated with IV antibiotics and also underwent an MRI of the abdomen and pelvis with MRCP because of complaints of nausea and vomiting and mildly elevated LFTs. There was no intrahepatic biliary ductal dilation the bile duct was prominent at 8 mm without stones noted gallbladder contracted around multiple gallstones, no definite acute cholecystitis. Patient was discharged home on a course of Cipro and Flagyl which she is still taking. She says she really was not eating when she left the hospital and continues to have a poor appetite and difficulty eating. Her son says she is eating very little perhaps a couple of spoonfuls of food at a time usually putting etc. Is not had any documented fever. She complains of nausea and has been vomiting on a daily basis. She says about half an hour after she takes her medications she usually vomits the sometimes  just spits up fluid. She has had ongoing weakness some lightheadedness with standing and ongoing complaints of upper abdominal discomfort more on the left upper abdomen. She denies any diarrhea.  Review of Systems Pertinent positive and negative review of systems were noted in the above HPI section.  All other review of systems was otherwise negative.  Outpatient Encounter Prescriptions as of 10/30/2014  Medication Sig  . albuterol (PROVENTIL HFA;VENTOLIN HFA) 108 (90 BASE) MCG/ACT inhaler Inhale 1-2 puffs into the lungs every 6 (six) hours as needed for wheezing or shortness of breath.  . calcitonin, salmon, (MIACALCIN/FORTICAL) 200 UNIT/ACT nasal spray Place 1 spray into alternate nostrils daily.  . ciprofloxacin (CIPRO) 500 MG tablet Take 1 tablet (500 mg total) by mouth 2 (two) times daily.  . famotidine (PEPCID) 20 MG tablet Take 1 tablet (20 mg total) by mouth daily.  Marland Kitchen gabapentin (NEURONTIN) 100 MG capsule Take 1 capsule (100 mg total) by mouth daily.  . metroNIDAZOLE (FLAGYL) 500 MG tablet Take 1 tablet (500 mg total) by mouth every 8 (eight) hours.  . pantoprazole (PROTONIX) 40 MG tablet Take 1 tablet (40 mg total) by mouth daily. Take 30-60 min before first meal of the day  . predniSONE (DELTASONE) 10 MG tablet TAKE 1 TABLET BY MOUTH ONCE DAILY  . Simethicone (GAS-X PO) Take 2 capsules by mouth daily.   . [DISCONTINUED] predniSONE (DELTASONE) 10 MG tablet TAKE 1 TABLET BY MOUTH ONCE DAILY   No facility-administered encounter medications on file as of 10/30/2014.   Allergies  Allergen Reactions  . Contrast  Media [Iodinated Diagnostic Agents] Other (See Comments)    Stomach discomfort  . Fruit & Vegetable Daily [Nutritional Supplements] Other (See Comments)    Citrus fruit causes stomach discomfort  . Ibuprofen Other (See Comments)    Stomach discomfort   Patient Active Problem List   Diagnosis Date Noted  . Abnormal LFTs (liver function tests)   . Dysphagia   . Abscess, abdomen  10/16/2014  . Hyponatremia 10/16/2014  . Diverticulitis of large intestine with perforation and abscess without bleeding 10/16/2014  . Thrombocytopenia 10/16/2014  . Diverticulitis 10/15/2014  . Dyspnea 10/05/2014  . Midline low back pain without sciatica   . Back pain 06/09/2014  . Fall   . CLL (chronic lymphocytic leukemia)   . Gallstones without obstruction of gallbladder   . Lumbar compression fracture 06/08/2014  . Gall stones 01/27/2013  . Chronic lymphocytic leukemia 06/06/2012  . Anemia, iron deficiency 06/06/2012   History   Social History  . Marital Status: Married    Spouse Name: N/A  . Number of Children: 2  . Years of Education: N/A   Occupational History  . Retired    Social History Main Topics  . Smoking status: Never Smoker   . Smokeless tobacco: Never Used     Comment: PT NEVER SMOKED  . Alcohol Use: No  . Drug Use: No  . Sexual Activity: Not on file   Other Topics Concern  . Not on file   Social History Narrative    Ms. Servellon's family history includes Diabetes in her father; Gallbladder disease in her mother; Stomach cancer in her sister. There is no history of Colon cancer, Colon polyps, or Esophageal cancer.      Objective:    Filed Vitals:   10/30/14 1422  BP: 84/56  Pulse: 88    Physical Exam  well-developed elderly [place] female in no acute distress, accompanied by her son blood pressure 84/56 pulse 88 height 4 foot 8 weight 125. HEENT; nontraumatic normocephalic EOMI PERRLA sclera anicteric, Supple; no JVD, Cardiovascular regular rate and rhythm with S1-S2 no murmur or gallop, Pulmonary; clear bilaterally, Abdomen ;soft she is tender across the upper abdomen and no palpable mass or hepatosplenomegaly bowel sounds are present no appreciable distention, Rectal ;exam not done, Extremities; no clubbing cyanosis or edema no tenting, Neuropsych: mood and affect appropriate she speaks little English       Assessment & Plan:   #1 81 yo  female with recent hospitalization with acute sigmoid diverticulitis with small abscess managed medically, also with N/V and known cholelithiasis-possible passed CBD stone Pt with persistent nausea/vomiting since discharge, not eating much, ongoing abdominal  Discomfort, upper abdominal pain, weakness, and concern for dehydration R/O persistent diverticulitis, cholecystitis Antibiotics may be contributing to nausea as well.  #2 CLL  Plan Offered re-admit to hospital, pt declines at present  Will check CBC, CMET now -admit if indicated Schedule for CT abd/pelvis no IV contrast in am - and they are aware admission may be indicated based on CT findings Push fluids Continue Cipro and flagyl Add Zofran 4 mg q 6 hours prn for nausea    Bishoy Cupp S Devian Bartolomei PA-C 10/30/2014   Cc: [name], MD

## 2014-10-31 ENCOUNTER — Ambulatory Visit (HOSPITAL_COMMUNITY)
Admission: RE | Admit: 2014-10-31 | Discharge: 2014-10-31 | Disposition: A | Discharge status: Home / Self Care | Payer: Medicare Other | Source: Ambulatory Visit | Attending: Physician Assistant | Admitting: Physician Assistant

## 2014-10-31 ENCOUNTER — Telehealth: Payer: Self-pay

## 2014-10-31 ENCOUNTER — Telehealth: Payer: Self-pay | Admitting: Physician Assistant

## 2014-10-31 ENCOUNTER — Encounter (HOSPITAL_COMMUNITY): Payer: Self-pay

## 2014-10-31 DIAGNOSIS — Z79899 Other long term (current) drug therapy: Secondary | ICD-10-CM

## 2014-10-31 DIAGNOSIS — K5900 Constipation, unspecified: Secondary | ICD-10-CM | POA: Insufficient documentation

## 2014-10-31 DIAGNOSIS — Z7952 Long term (current) use of systemic steroids: Secondary | ICD-10-CM

## 2014-10-31 DIAGNOSIS — K5720 Diverticulitis of large intestine with perforation and abscess without bleeding: Principal | ICD-10-CM | POA: Diagnosis present

## 2014-10-31 DIAGNOSIS — R1310 Dysphagia, unspecified: Secondary | ICD-10-CM | POA: Diagnosis present

## 2014-10-31 DIAGNOSIS — K8020 Calculus of gallbladder without cholecystitis without obstruction: Secondary | ICD-10-CM | POA: Diagnosis present

## 2014-10-31 DIAGNOSIS — K632 Fistula of intestine: Secondary | ICD-10-CM | POA: Diagnosis not present

## 2014-10-31 DIAGNOSIS — K317 Polyp of stomach and duodenum: Secondary | ICD-10-CM | POA: Diagnosis present

## 2014-10-31 DIAGNOSIS — Z833 Family history of diabetes mellitus: Secondary | ICD-10-CM

## 2014-10-31 DIAGNOSIS — Z886 Allergy status to analgesic agent status: Secondary | ICD-10-CM

## 2014-10-31 DIAGNOSIS — R634 Abnormal weight loss: Secondary | ICD-10-CM | POA: Insufficient documentation

## 2014-10-31 DIAGNOSIS — K5792 Diverticulitis of intestine, part unspecified, without perforation or abscess without bleeding: Secondary | ICD-10-CM

## 2014-10-31 DIAGNOSIS — D649 Anemia, unspecified: Secondary | ICD-10-CM | POA: Diagnosis present

## 2014-10-31 DIAGNOSIS — E440 Moderate protein-calorie malnutrition: Secondary | ICD-10-CM | POA: Diagnosis not present

## 2014-10-31 DIAGNOSIS — E876 Hypokalemia: Secondary | ICD-10-CM | POA: Diagnosis not present

## 2014-10-31 DIAGNOSIS — M1990 Unspecified osteoarthritis, unspecified site: Secondary | ICD-10-CM | POA: Diagnosis present

## 2014-10-31 DIAGNOSIS — Z800 Family history of malignant neoplasm of digestive organs: Secondary | ICD-10-CM

## 2014-10-31 DIAGNOSIS — D72829 Elevated white blood cell count, unspecified: Secondary | ICD-10-CM | POA: Diagnosis present

## 2014-10-31 DIAGNOSIS — K5732 Diverticulitis of large intestine without perforation or abscess without bleeding: Secondary | ICD-10-CM | POA: Diagnosis not present

## 2014-10-31 DIAGNOSIS — N321 Vesicointestinal fistula: Secondary | ICD-10-CM | POA: Diagnosis present

## 2014-10-31 DIAGNOSIS — D696 Thrombocytopenia, unspecified: Secondary | ICD-10-CM | POA: Diagnosis not present

## 2014-10-31 DIAGNOSIS — Z8619 Personal history of other infectious and parasitic diseases: Secondary | ICD-10-CM

## 2014-10-31 DIAGNOSIS — R112 Nausea with vomiting, unspecified: Secondary | ICD-10-CM | POA: Insufficient documentation

## 2014-10-31 DIAGNOSIS — B370 Candidal stomatitis: Secondary | ICD-10-CM | POA: Diagnosis not present

## 2014-10-31 DIAGNOSIS — C9110 Chronic lymphocytic leukemia of B-cell type not having achieved remission: Secondary | ICD-10-CM | POA: Diagnosis not present

## 2014-10-31 DIAGNOSIS — E871 Hypo-osmolality and hyponatremia: Secondary | ICD-10-CM | POA: Diagnosis present

## 2014-10-31 DIAGNOSIS — R109 Unspecified abdominal pain: Secondary | ICD-10-CM | POA: Diagnosis not present

## 2014-10-31 DIAGNOSIS — E860 Dehydration: Secondary | ICD-10-CM | POA: Diagnosis present

## 2014-10-31 DIAGNOSIS — R6881 Early satiety: Secondary | ICD-10-CM | POA: Diagnosis present

## 2014-10-31 DIAGNOSIS — Z91041 Radiographic dye allergy status: Secondary | ICD-10-CM

## 2014-10-31 DIAGNOSIS — J45909 Unspecified asthma, uncomplicated: Secondary | ICD-10-CM | POA: Diagnosis present

## 2014-10-31 NOTE — Telephone Encounter (Signed)
Amy is aware of the results. Pam has spoken with the son.

## 2014-10-31 NOTE — Telephone Encounter (Signed)
Discussed with pt's son by phone- she needs to be admitted to [facility]- fo IV abx ,surgical consult etc. -he wanted to take pt home to convince her of need for admit- I called hospitalist for admission and they will not admit since no vitals Today...Marland KitchenMarland KitchenMarland Kitchen Please call pts son and have him bring her thru the ER at [facility] for admission-they need to come To hospital by early afternoon   5pm-Notes Recorded by Greggory Keen, LPN on 07/04/1094 at 0:45 PM Spoke with the son. He works night shift and was sleeping. Advised of the need to take the patient to the ER for admission. He states his mother is refusing to go today. He has gotten her to agree to go in the morning. He understands this is against medical advice.

## 2014-10-31 NOTE — Progress Notes (Signed)
Reviewed and agree with management plan.  Brysin Towery T. Minnie Legros, MD FACG 

## 2014-11-01 ENCOUNTER — Telehealth: Payer: Self-pay | Admitting: Physician Assistant

## 2014-11-01 ENCOUNTER — Encounter (HOSPITAL_COMMUNITY): Payer: Self-pay | Admitting: Emergency Medicine

## 2014-11-01 ENCOUNTER — Inpatient Hospital Stay (HOSPITAL_COMMUNITY)
Admission: EM | Admit: 2014-11-01 | Discharge: 2014-11-13 | DRG: 392 | Disposition: A | Discharge status: Home Health | Payer: Medicare Other | Attending: Internal Medicine | Admitting: Internal Medicine

## 2014-11-01 DIAGNOSIS — Z79899 Other long term (current) drug therapy: Secondary | ICD-10-CM | POA: Diagnosis not present

## 2014-11-01 DIAGNOSIS — R6881 Early satiety: Secondary | ICD-10-CM | POA: Insufficient documentation

## 2014-11-01 DIAGNOSIS — R161 Splenomegaly, not elsewhere classified: Secondary | ICD-10-CM | POA: Diagnosis not present

## 2014-11-01 DIAGNOSIS — R1310 Dysphagia, unspecified: Secondary | ICD-10-CM | POA: Diagnosis not present

## 2014-11-01 DIAGNOSIS — D509 Iron deficiency anemia, unspecified: Secondary | ICD-10-CM | POA: Diagnosis not present

## 2014-11-01 DIAGNOSIS — K6389 Other specified diseases of intestine: Secondary | ICD-10-CM | POA: Diagnosis not present

## 2014-11-01 DIAGNOSIS — E876 Hypokalemia: Secondary | ICD-10-CM | POA: Diagnosis not present

## 2014-11-01 DIAGNOSIS — R0600 Dyspnea, unspecified: Secondary | ICD-10-CM | POA: Diagnosis present

## 2014-11-01 DIAGNOSIS — C9110 Chronic lymphocytic leukemia of B-cell type not having achieved remission: Secondary | ICD-10-CM | POA: Diagnosis present

## 2014-11-01 DIAGNOSIS — K651 Peritoneal abscess: Secondary | ICD-10-CM | POA: Diagnosis not present

## 2014-11-01 DIAGNOSIS — K8020 Calculus of gallbladder without cholecystitis without obstruction: Secondary | ICD-10-CM | POA: Diagnosis present

## 2014-11-01 DIAGNOSIS — B370 Candidal stomatitis: Secondary | ICD-10-CM | POA: Diagnosis not present

## 2014-11-01 DIAGNOSIS — N321 Vesicointestinal fistula: Secondary | ICD-10-CM | POA: Diagnosis not present

## 2014-11-01 DIAGNOSIS — IMO0002 Reserved for concepts with insufficient information to code with codable children: Secondary | ICD-10-CM | POA: Diagnosis present

## 2014-11-01 DIAGNOSIS — Z91041 Radiographic dye allergy status: Secondary | ICD-10-CM | POA: Diagnosis not present

## 2014-11-01 DIAGNOSIS — Z886 Allergy status to analgesic agent status: Secondary | ICD-10-CM | POA: Diagnosis not present

## 2014-11-01 DIAGNOSIS — K5720 Diverticulitis of large intestine with perforation and abscess without bleeding: Secondary | ICD-10-CM | POA: Diagnosis not present

## 2014-11-01 DIAGNOSIS — E440 Moderate protein-calorie malnutrition: Secondary | ICD-10-CM | POA: Diagnosis present

## 2014-11-01 DIAGNOSIS — Z7952 Long term (current) use of systemic steroids: Secondary | ICD-10-CM | POA: Diagnosis not present

## 2014-11-01 DIAGNOSIS — D649 Anemia, unspecified: Secondary | ICD-10-CM | POA: Diagnosis present

## 2014-11-01 DIAGNOSIS — Z800 Family history of malignant neoplasm of digestive organs: Secondary | ICD-10-CM | POA: Diagnosis not present

## 2014-11-01 DIAGNOSIS — Z8619 Personal history of other infectious and parasitic diseases: Secondary | ICD-10-CM | POA: Diagnosis not present

## 2014-11-01 DIAGNOSIS — Z833 Family history of diabetes mellitus: Secondary | ICD-10-CM | POA: Diagnosis not present

## 2014-11-01 DIAGNOSIS — J45909 Unspecified asthma, uncomplicated: Secondary | ICD-10-CM | POA: Diagnosis present

## 2014-11-01 DIAGNOSIS — D696 Thrombocytopenia, unspecified: Secondary | ICD-10-CM | POA: Diagnosis not present

## 2014-11-01 DIAGNOSIS — K632 Fistula of intestine: Secondary | ICD-10-CM | POA: Diagnosis not present

## 2014-11-01 DIAGNOSIS — D72829 Elevated white blood cell count, unspecified: Secondary | ICD-10-CM | POA: Diagnosis present

## 2014-11-01 DIAGNOSIS — K317 Polyp of stomach and duodenum: Secondary | ICD-10-CM | POA: Diagnosis not present

## 2014-11-01 DIAGNOSIS — K5792 Diverticulitis of intestine, part unspecified, without perforation or abscess without bleeding: Secondary | ICD-10-CM | POA: Diagnosis present

## 2014-11-01 DIAGNOSIS — M1990 Unspecified osteoarthritis, unspecified site: Secondary | ICD-10-CM | POA: Diagnosis present

## 2014-11-01 DIAGNOSIS — L0291 Cutaneous abscess, unspecified: Secondary | ICD-10-CM

## 2014-11-01 DIAGNOSIS — E871 Hypo-osmolality and hyponatremia: Secondary | ICD-10-CM | POA: Diagnosis not present

## 2014-11-01 DIAGNOSIS — R109 Unspecified abdominal pain: Secondary | ICD-10-CM | POA: Diagnosis not present

## 2014-11-01 DIAGNOSIS — E860 Dehydration: Secondary | ICD-10-CM | POA: Diagnosis present

## 2014-11-01 LAB — BASIC METABOLIC PANEL
Anion gap: 9 (ref 5–15)
BUN: 6 mg/dL (ref 6–20)
CHLORIDE: 96 mmol/L — AB (ref 101–111)
CO2: 25 mmol/L (ref 22–32)
CREATININE: 0.49 mg/dL (ref 0.44–1.00)
Calcium: 8.6 mg/dL — ABNORMAL LOW (ref 8.9–10.3)
GFR calc non Af Amer: >60 mL/min (ref >60)
Glucose, Bld: 113 mg/dL — ABNORMAL HIGH (ref 70–99)
Potassium: 4.1 mmol/L (ref 3.5–5.1)
Sodium: 130 mmol/L — ABNORMAL LOW (ref 135–145)

## 2014-11-01 LAB — CBC WITH DIFFERENTIAL/PLATELET
Basophils Absolute: 0 10*3/uL (ref 0.0–0.1)
Basophils Relative: 0 % (ref 0–1)
EOS ABS: 0 10*3/uL (ref 0.0–0.7)
Eosinophils Relative: 0 % (ref 0–5)
HEMATOCRIT: 36.6 % (ref 36.0–46.0)
Hemoglobin: 11.6 g/dL — ABNORMAL LOW (ref 12.0–15.0)
Lymphocytes Relative: 90 % — ABNORMAL HIGH (ref 12–46)
Lymphs Abs: 28.7 10*3/uL — ABNORMAL HIGH (ref 0.7–4.0)
MCH: 22.6 pg — ABNORMAL LOW (ref 26.0–34.0)
MCHC: 31.7 g/dL (ref 30.0–36.0)
MCV: 71.2 fL — AB (ref 78.0–100.0)
Monocytes Absolute: 0.6 10*3/uL (ref 0.1–1.0)
Monocytes Relative: 2 % — ABNORMAL LOW (ref 3–12)
NEUTROS ABS: 2.5 10*3/uL (ref 1.7–7.7)
Neutrophils Relative %: 8 % — ABNORMAL LOW (ref 43–77)
PLATELETS: 100 10*3/uL — AB (ref 150–400)
RBC: 5.14 MIL/uL — ABNORMAL HIGH (ref 3.87–5.11)
RDW: 13.7 % (ref 11.5–15.5)
WBC: 31.8 10*3/uL — ABNORMAL HIGH (ref 4.0–10.5)

## 2014-11-01 LAB — URINALYSIS, ROUTINE W REFLEX MICROSCOPIC
BILIRUBIN URINE: NEGATIVE
Glucose, UA: NEGATIVE mg/dL
Ketones, ur: NEGATIVE mg/dL
NITRITE: NEGATIVE
PROTEIN: NEGATIVE mg/dL
SPECIFIC GRAVITY, URINE: 1.006 (ref 1.005–1.030)
UROBILINOGEN UA: 0.2 mg/dL (ref 0.0–1.0)
pH: 7.5 (ref 5.0–8.0)

## 2014-11-01 LAB — URINE MICROSCOPIC-ADD ON

## 2014-11-01 MED ORDER — CIPROFLOXACIN IN D5W 400 MG/200ML IV SOLN
400.0000 mg | Freq: Two times a day (BID) | INTRAVENOUS | Status: DC
  Administered 2014-11-01 – 2014-11-13 (×25): 400 mg via INTRAVENOUS
  Filled 2014-11-01 (×26): qty 200

## 2014-11-01 MED ORDER — GABAPENTIN 100 MG PO CAPS
100.0000 mg | ORAL_CAPSULE | Freq: Every day | ORAL | Status: DC
  Administered 2014-11-03 – 2014-11-13 (×10): 100 mg via ORAL
  Filled 2014-11-01 (×13): qty 1

## 2014-11-01 MED ORDER — ENOXAPARIN SODIUM 40 MG/0.4ML ~~LOC~~ SOLN
40.0000 mg | SUBCUTANEOUS | Status: DC
  Filled 2014-11-01 (×5): qty 0.4

## 2014-11-01 MED ORDER — SODIUM CHLORIDE 0.9 % IV SOLN
Freq: Once | INTRAVENOUS | Status: AC
  Administered 2014-11-01: 12:00:00 via INTRAVENOUS

## 2014-11-01 MED ORDER — PREDNISONE 10 MG PO TABS
10.0000 mg | ORAL_TABLET | Freq: Every day | ORAL | Status: DC
  Filled 2014-11-01 (×2): qty 1

## 2014-11-01 MED ORDER — DEXTROSE-NACL 5-0.45 % IV SOLN
INTRAVENOUS | Status: AC
  Administered 2014-11-01: 13:00:00 via INTRAVENOUS

## 2014-11-01 MED ORDER — SENNA 8.6 MG PO TABS
1.0000 | ORAL_TABLET | Freq: Every day | ORAL | Status: DC | PRN
  Administered 2014-11-01 – 2014-11-03 (×3): 8.6 mg via ORAL
  Administered 2014-11-04 – 2014-11-10 (×7): 17.2 mg via ORAL
  Filled 2014-11-01: qty 1
  Filled 2014-11-01: qty 2
  Filled 2014-11-01: qty 1
  Filled 2014-11-01 (×2): qty 2
  Filled 2014-11-01: qty 1
  Filled 2014-11-01 (×3): qty 2
  Filled 2014-11-01 (×2): qty 1

## 2014-11-01 MED ORDER — PANTOPRAZOLE SODIUM 40 MG PO TBEC
40.0000 mg | DELAYED_RELEASE_TABLET | Freq: Every day | ORAL | Status: DC
  Administered 2014-11-02 – 2014-11-13 (×11): 40 mg via ORAL
  Filled 2014-11-01 (×12): qty 1

## 2014-11-01 MED ORDER — ALBUTEROL SULFATE (2.5 MG/3ML) 0.083% IN NEBU: 2.5000 mg | INHALATION_SOLUTION | Freq: Four times a day (QID) | RESPIRATORY_TRACT | Status: DC | PRN

## 2014-11-01 MED ORDER — METRONIDAZOLE IN NACL 5-0.79 MG/ML-% IV SOLN
500.0000 mg | Freq: Three times a day (TID) | INTRAVENOUS | Status: DC
  Administered 2014-11-01 – 2014-11-05 (×12): 500 mg via INTRAVENOUS
  Filled 2014-11-01 (×14): qty 100

## 2014-11-01 MED ORDER — CIPROFLOXACIN IN D5W 400 MG/200ML IV SOLN: 400.0000 mg | Freq: Two times a day (BID) | INTRAVENOUS | Status: DC

## 2014-11-01 MED ORDER — METRONIDAZOLE IN NACL 5-0.79 MG/ML-% IV SOLN: 500.0000 mg | Freq: Three times a day (TID) | INTRAVENOUS | Status: DC

## 2014-11-01 MED ORDER — SIMETHICONE 80 MG PO CHEW
80.0000 mg | CHEWABLE_TABLET | Freq: Four times a day (QID) | ORAL | Status: DC | PRN
  Administered 2014-11-08: 80 mg via ORAL
  Filled 2014-11-01 (×3): qty 1

## 2014-11-01 NOTE — Telephone Encounter (Signed)
Spoke with [employer]. He is on the way to [place] with his mother now.

## 2014-11-01 NOTE — Consult Note (Signed)
Referring Provider: [facility] Primary Care Physician:  [name], MD Primary Gastroenterologist:  Dr. Fuller Plan  Reason for Consultation:  Diverticulitis  HPI: Stefanie Henry is a 81 y.o. female is an 81 year old female who was followed medically at the [place] in [facility]. She has a history of CLL and asthma. She was admitted to the hospital on April 18 with abdominal pain of 3 weeks' duration. At that time she was complaining of a 3 week history of left lower quadrant pain. She had a CT of the abdomen and pelvis that showed a left lower quadrant sigmoid diverticulitis with contained perforation and abscess. She was evaluated by surgery and was treated with IV antibiotics.  She has been seen by Dr. Fuller Plan in the office in July 2015 for concerns of chronic cholecystitis and cholelithiasis however the patient declined surgery. She has a long-standing history of frequent mid upper abdominal discomfort, bloating, and gas. Her symptoms have minimally responded to probiotics, Gas-X, Mylanta, Pepcid, and occasional PPI use.  On her last admission she was noted to have an acute increase in her liver enzymes with a history of gallstones raising the question of a common bile duct stone with transient obstruction. An MRCP was obtained and had no evidence of acute cholecystitis and no evidence of choledocholithiasis or biliary tract obstruction. The common bile duct was mildly dilated to 8 mm in the porta hepatis which was felt to be within normal limits for a patient of this age. There is no associated intrahepatic biliary ductal dilatation. Her LFTs were trending and her AST and ALT declined. But herPhosphatase remained somewhat elevated and it was thought she may have passed a gallstone. Her left-sided abdominal pain decreased and surgery felt she was okay for discharge from their standpoint. The patient had also describes some dysphagia on that admission and was advised to follow-up with  GI.  Patient was discharged home on a course of Cipro and Flagyl which she was still taken when seen in the office 2 days ago. She reported that she really has not been eating since discharge and has been having soups and puddings. She feels full after 2 bites and feels bloated and distended. She has had no nausea or vomiting except for when she was on Flagyl. She is moving her bowels as long as she uses Senokot every day. Her son is with her and says she has been using Senokot for a long time. She reported in the office that she would vomit about an hour and a half after she took her Flagyl. Otherwise she has not been vomiting she just feels very full. She does not feel as if food is getting stuck in her esophagus she just feels full quickly. Patient denies current use of NSAIDs but was using them regularly which she stopped 2 years ago. She has never had an EGD and has never had a colonoscopy. She is not aware of family history of colon cancer, colon polyps, or inflammatory bowel disease. She states her mother had gallbladder disease and had a cholecystectomy.   Past Medical History  Diagnosis Date  . Asthma   . Arthritis   . Shingles   . Anemia, iron deficiency 06/06/2012  . Chronic lymphatic leukemia   . Gallstones   . Diverticulitis     Past Surgical History  Procedure Laterality Date  . None      Prior to Admission medications   Medication Sig Start Date End Date Taking? Authorizing Provider  albuterol (  PROVENTIL HFA;VENTOLIN HFA) 108 (90 BASE) MCG/ACT inhaler Inhale 1-2 puffs into the lungs every 6 (six) hours as needed for wheezing or shortness of breath.   Yes Historical Provider, MD  albuterol (PROVENTIL) (2.5 MG/3ML) 0.083% nebulizer solution Take 2.5 mg by nebulization every 6 (six) hours as needed for wheezing or shortness of breath.   Yes Historical Provider, MD  calcitonin, salmon, (MIACALCIN/FORTICAL) 200 UNIT/ACT nasal spray Place 1 spray into alternate nostrils daily.  06/15/14  Yes Ingvald Theisen J Angiulli, PA-C  ciprofloxacin (CIPRO) 500 MG tablet Take 1 tablet (500 mg total) by mouth 2 (two) times daily. Patient taking differently: Take 500 mg by mouth 2 (two) times daily. For 10 days 10/20/14  Yes Costin Karlyne Greenspan, MD  famotidine (PEPCID) 20 MG tablet Take 1 tablet (20 mg total) by mouth daily. Patient taking differently: Take 20 mg by mouth daily as needed for heartburn or indigestion.  06/15/14  Yes Maximiliano Cromartie J Angiulli, PA-C  gabapentin (NEURONTIN) 100 MG capsule Take 1 capsule (100 mg total) by mouth daily. Patient taking differently: Take 100 mg by mouth daily as needed (for nerve pain).  06/15/14  Yes Lisabeth Mian J Angiulli, PA-C  metroNIDAZOLE (FLAGYL) 500 MG tablet Take 1 tablet (500 mg total) by mouth every 8 (eight) hours. Patient taking differently: Take 500 mg by mouth every 8 (eight) hours. For 10 days 10/20/14  Yes Costin Karlyne Greenspan, MD  omeprazole (PRILOSEC) 20 MG capsule Take 20 mg by mouth daily.   Yes Historical Provider, MD  pantoprazole (PROTONIX) 40 MG tablet Take 1 tablet (40 mg total) by mouth daily. Take 30-60 min before first meal of the day 10/05/14  Yes Tanda Rockers, MD  predniSONE (DELTASONE) 10 MG tablet TAKE 1 TABLET BY MOUTH ONCE DAILY Patient taking differently: TAKE 1 TABLET BY MOUTH ONCE DAILY as needed for CLL 10/03/14  Yes [name], MD  senna (SENOKOT) 8.6 MG TABS tablet Take 1-2 tablets by mouth daily as needed for mild constipation.   Yes Historical Provider, MD  Simethicone (GAS-X PO) Take 2 capsules by mouth daily.    Yes Historical Provider, MD    Current Facility-Administered Medications  Medication Dose Route Frequency Provider Last Rate Last Dose  . albuterol (PROVENTIL) (2.5 MG/3ML) 0.083% nebulizer solution 2.5 mg  2.5 mg Nebulization Q6H PRN Costin Karlyne Greenspan, MD      . ciprofloxacin (CIPRO) IVPB 400 mg  400 mg Intravenous Q12H Costin Karlyne Greenspan, MD      . dextrose 5 %-0.45 % sodium chloride infusion   Intravenous STAT Varney Biles, MD 125 mL/hr at 11/01/14 1258    . enoxaparin (LOVENOX) injection 40 mg  40 mg Subcutaneous Q24H Costin Karlyne Greenspan, MD      . gabapentin (NEURONTIN) capsule 100 mg  100 mg Oral Daily Costin Karlyne Greenspan, MD      . metroNIDAZOLE (FLAGYL) IVPB 500 mg  500 mg Intravenous Q8H Costin Karlyne Greenspan, MD      . Derrill Memo ON 11/02/2014] pantoprazole (PROTONIX) EC tablet 40 mg  40 mg Oral Daily Costin Karlyne Greenspan, MD      . predniSONE (DELTASONE) tablet 10 mg  10 mg Oral Daily Costin Karlyne Greenspan, MD      . senna (SENOKOT) tablet 8.6-17.2 mg  1-2 tablet Oral Daily PRN Costin Karlyne Greenspan, MD      . simethicone (MYLICON) chewable tablet 80 mg  80 mg Oral QID PRN Costin Karlyne Greenspan, MD  Allergies as of 11/01/2014 - Review Complete 11/01/2014  Allergen Reaction Noted  . Contrast media [iodinated diagnostic agents] Other (See Comments) 06/08/2014  . Fruit & vegetable daily [nutritional supplements] Other (See Comments) 06/08/2014  . Ibuprofen Other (See Comments) 06/08/2014    Family History  Problem Relation Age of Onset  . Diabetes Father   . Stomach cancer Sister   . Colon cancer Neg Hx   . Colon polyps Neg Hx   . Esophageal cancer Neg Hx   . Gallbladder disease Mother     History   Social History  . Marital Status: Married    Spouse Name: N/A  . Number of Children: 2  . Years of Education: N/A   Occupational History  . Retired    Social History Main Topics  . Smoking status: Never Smoker   . Smokeless tobacco: Never Used     Comment: PT NEVER SMOKED  . Alcohol Use: No  . Drug Use: No  . Sexual Activity: Not on file   Other Topics Concern  . Not on file   Social History Narrative    Review of Systems: Gen: Patient and her son say she has lost 5-7 pounds since discharge from the hospital last admission. Cardiovascular: Patient reports she feels short of breath when she eats. She also reports that she gets winded earlier with activity. She has no dyspnea at rest. She says she was  scheduled to have an EKG with her primary care doctor but this was canceled because she didn't feel well enough to make the appointment. Resp: Denies dyspnea at rest has, dyspnea with exercise GI: Complaints of early satiety, dyspepsia, and nausea with Flagyl.. GU : Denies urinary burning, blood in urine, urinary frequency, urinary hesitancy, nocturnal urination, and urinary incontinence. MS: Denies joint pain, limitation of movement, and swelling, stiffness, low back pain, extremity pain. Denies muscle weakness, cramps, atrophy.  Derm: Denies rash, itching, dry skin, hives, moles, warts, or unhealing ulcers.  Psych: Denies depression, anxiety, memory loss, suicidal ideation, hallucinations, paranoia, and confusion. Heme: Denies bruising, bleeding, and enlarged lymph nodes. Neuro:  Denies any headaches, dizziness, paresthesias. Endo:  Denies any problems with DM, thyroid, adrenal function.  Physical Exam: Vital signs in last 24 hours: Temp:  [97.3 F (36.3 C)-98.5 F (36.9 C)] 97.3 F (36.3 C) (05/05 1237) Pulse Rate:  [77-87] 77 (05/05 1237) Resp:  [16] 16 (05/05 1237) BP: (111-136)/(55-68) 136/68 mmHg (05/05 1237) SpO2:  [98 %-100 %] 100 % (05/05 1237) Weight:  [123 lb 0.3 oz (55.8 kg)] 123 lb 0.3 oz (55.8 kg) (05/05 1237)   General:   Alert,  Well-developed, well-nourished, pleasant and cooperative in NAD, patient speaks a few words of English but the majority of information is obtained through her son who translates Head:  Normocephalic and atraumatic. Eyes:  Sclera clear, no icterus.   Conjunctiva pink. Ears:  Normal auditory acuity. Nose:  No deformity, discharge,  or lesions. Mouth:  No deformity or lesions.   Neck:  Supple; no masses or thyromegaly. Lungs:  Clear throughout to auscultation.     Heart:  Regular rate and rhythm; no murmurs, clicks, rubs,  or gallops. Abdomen:  Soft,nontender, BS active,nonpalp mass or hsm.   Rectal:  Deferred  Msk:  Symmetrical without gross  deformities. . Pulses:  Normal pulses noted. Extremities:  Without clubbing or edema. Neurologic:  Alert and  oriented x4;  grossly normal neurologically. Skin: Intact without significant lesions or rashes.. Psych:  Alert and cooperative. Normal  mood and affect.   Lab Results:  Recent Labs  10/30/14 1455 11/01/14 1125  WBC 29.6 Repeated and verified X2.* 31.8*  HGB 11.3* 11.6*  HCT 35.4* 36.6  PLT 123.0* 100*   BMET  Recent Labs  10/30/14 1455 11/01/14 1125  NA 129* 130*  K 3.8 4.1  CL 95* 96*  CO2 29 25  GLUCOSE 99 113*  BUN 5* 6  CREATININE 0.63 0.49  CALCIUM 8.5 8.6*   LFT  Recent Labs  10/30/14 1455  PROT 5.6*  ALBUMIN 3.6  AST 13  ALT 13  ALKPHOS 76  BILITOT 0.5      Studies/Results: Ct Abdomen Pelvis Wo Contrast  10/31/2014   CLINICAL DATA:  Recent hospital admission for diverticulitis and abscess. Nausea and vomiting for 1 week, chronic constipation, weight loss.  EXAM: CT ABDOMEN AND PELVIS WITHOUT CONTRAST  TECHNIQUE: Multidetector CT imaging of the abdomen and pelvis was performed following the standard protocol without IV contrast.  COMPARISON:  MR abdomen 10/19/2014 and CT abdomen 10/15/2014.  FINDINGS: Lower chest: Lung bases show mild volume loss in the right middle lobe and lingula. Minimal scarring in the medial aspects of the lower lobes. Heart is at the upper limits of normal in size to mildly enlarged. No pericardial or pleural effusion.  Hepatobiliary: Liver and gallbladder are unremarkable. No biliary ductal dilatation.  Pancreas: Negative.  Spleen: 14.2 x 4.4 x 11.5 cm, upper normal in size.  Adrenals/Urinary Tract: Adrenal glands are unremarkable. Exophytic low-attenuation lesion off the right kidney measures 12 mm, shown to represent a cyst on 10/19/2014. Kidneys are otherwise unremarkable. No urinary stones. Ureters are decompressed. Small amount of air is seen within the bladder, new.  Stomach/Bowel: Stomach, small bowel and majority of the  colon are unremarkable. There is residual haziness about the proximal sigmoid colon with a persistent extraluminal collection of fluid and air between the sigmoid colon and bladder, measuring 2.5 cm (series 2, image 60). Small amount of air is seen within the bladder, raising suspicion for a fistula if there is no recent bladder catheterization.  Vascular/Lymphatic: Atherosclerotic calcification of the arterial vasculature without abdominal aortic aneurysm. Scattered lymph nodes are not enlarged by CT size criteria.  Reproductive: Calcified lesions in the uterus are likely fibroids. Ovaries are visualized.  Other: No free fluid. Mesenteries and peritoneum are unremarkable. Tiny periumbilical hernia contains fat.  Musculoskeletal: No worrisome lytic or sclerotic lesions. Degenerative changes are seen in the spine and hips. L2 compression fracture, as on 10/15/2014. Advanced degenerative disc disease at L5-S1.  IMPRESSION: Residual changes of sigmoid diverticulitis and associated abscess. New air in the bladder raises suspicion for a colonovesical fistula, in the absence of recent bladder catheterization.   Electronically Signed   By: Lorin Picket M.D.   On: 10/31/2014 08:17    IMPRESSION/PLAN: #1. Early satiety, dyspepsia, mild upper abdominal pain associated with bloating. Patient has known cholelithiasis. Recent MRCP nonrevealing. Patient may have GERD, gastritis, chronic cholecystitis, or functional dyspepsia. CT yesterday with diverticulitis but no liver or gallbladder abnormalities.  #2. Diverticulitis. CT demonstrates residual changes of sigmoid diverticulitis and associated abscess but now has new air in the bladder raising suspicion for a colovesical fistula. Recommend surgical evaluation. Continue Cipro and Flagyl.   Hvozdovic, Deloris Ping 11/01/2014,  Pager [phone]   ________________________________________________________________________  Velora Heckler GI MD note:  I personally examined the  patient, reviewed the data and agree with the assessment and plan described above.  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  Her other GI symptoms, of which there are many, may be related to her pelvic infection.  She has gallstones and they may be causing symptoms but there is certainly no acute cholecystitis on CT and her LFTs normalized as of two days ago since recent bump.  I will order repeat LFTs for the morning.   No role for endoscopic procedures currently. Will follow along.   Owens Loffler, MD [facility] Pager [phone]

## 2014-11-01 NOTE — Consult Note (Signed)
Reason for Consult: Colovesical fistula Referring Physician:   Dr Terance Ice  Stefanie Henry is an 81 y.o. female.  HPI: 81 y/o female, with CCL admitted on 10/15/14 with sigmoid diverticulosis and an abscess to small to drain between the sigmoid and the bladder.  She improved on antibiotics, adn went home on 10/20/14.  GI work up was obtained for her gallstones with mildly abnormal LFT's.  Repeat CT scan on 10/31/14 shows:  Residual changes of sigmoid diverticulitis and associated abscess. New air in the bladder raises suspicion for a colonovesical fistula, in the absence of recent bladder catheterization. WBC is up   Pt and her husband state she has no abdominal pain and has had no diarrhea.  Pt had been on abx since last DC.  Past Medical History  Diagnosis Date  Chronic lymphatic leukemia   Cholelithiasis/abnormal LFT's   Hx of Shingles   Anemia, iron deficiency 06/06/2012  Chronic lymphatic leukemia   Diverticulitis        Past Surgical History  Procedure Laterality Date  . None      Family History  Problem Relation Age of Onset  . Diabetes Father   . Stomach cancer Sister   . Colon cancer Neg Hx   . Colon polyps Neg Hx   . Esophageal cancer Neg Hx   . Gallbladder disease Mother     Social History:  reports that she has never smoked. She has never used smokeless tobacco. She reports that she does not drink alcohol or use illicit drugs.  Allergies:  Allergies  Allergen Reactions  . Contrast Media [Iodinated Diagnostic Agents] Other (See Comments)    Stomach discomfort  . Fruit & Vegetable Daily [Nutritional Supplements] Other (See Comments)    Citrus fruit causes stomach discomfort  . Ibuprofen Other (See Comments)    Stomach discomfort    Medications:  Prior to Admission:  Prescriptions prior to admission  Medication Sig Dispense Refill Last Dose  . albuterol (PROVENTIL HFA;VENTOLIN HFA) 108 (90 BASE) MCG/ACT inhaler Inhale 1-2 puffs into the lungs every 6  (six) hours as needed for wheezing or shortness of breath.   Past Week at Unknown time  . albuterol (PROVENTIL) (2.5 MG/3ML) 0.083% nebulizer solution Take 2.5 mg by nebulization every 6 (six) hours as needed for wheezing or shortness of breath.   Past Week at Unknown time  . calcitonin, salmon, (MIACALCIN/FORTICAL) 200 UNIT/ACT nasal spray Place 1 spray into alternate nostrils daily. 3.7 mL 12 Past Week at Unknown time  . ciprofloxacin (CIPRO) 500 MG tablet Take 1 tablet (500 mg total) by mouth 2 (two) times daily. (Patient taking differently: Take 500 mg by mouth 2 (two) times daily. For 10 days) 20 tablet 0 10/31/2014 at Unknown time  . famotidine (PEPCID) 20 MG tablet Take 1 tablet (20 mg total) by mouth daily. (Patient taking differently: Take 20 mg by mouth daily as needed for heartburn or indigestion. ) 30 tablet 1 10/31/2014 at Unknown time  . gabapentin (NEURONTIN) 100 MG capsule Take 1 capsule (100 mg total) by mouth daily. (Patient taking differently: Take 100 mg by mouth daily as needed (for nerve pain). ) 30 capsule 1 Past Month at Unknown time  . metroNIDAZOLE (FLAGYL) 500 MG tablet Take 1 tablet (500 mg total) by mouth every 8 (eight) hours. (Patient taking differently: Take 500 mg by mouth every 8 (eight) hours. For 10 days) 30 tablet 0 10/31/2014 at Unknown time  . omeprazole (PRILOSEC) 20 MG capsule Take 20 mg by  mouth daily.   11/01/2014 at Unknown time  . pantoprazole (PROTONIX) 40 MG tablet Take 1 tablet (40 mg total) by mouth daily. Take 30-60 min before first meal of the day 30 tablet 2 Past Month at Unknown time  . predniSONE (DELTASONE) 10 MG tablet TAKE 1 TABLET BY MOUTH ONCE DAILY (Patient taking differently: TAKE 1 TABLET BY MOUTH ONCE DAILY as needed for CLL) 30 tablet 1 Past Month at Unknown time  . senna (SENOKOT) 8.6 MG TABS tablet Take 1-2 tablets by mouth daily as needed for mild constipation.   11/01/2014 at Unknown time  . Simethicone (GAS-X PO) Take 2 capsules by mouth daily.     10/31/2014 at Unknown time   Scheduled: . ciprofloxacin  400 mg Intravenous Q12H  . dextrose 5 % and 0.45% NaCl   Intravenous STAT  . enoxaparin (LOVENOX) injection  40 mg Subcutaneous Q24H  . gabapentin  100 mg Oral Daily  . metronidazole  500 mg Intravenous Q8H  . [START ON 11/02/2014] pantoprazole  40 mg Oral Daily  . predniSONE  10 mg Oral Daily   Continuous:  LEX:[id number], senna, simethicone Anti-infectives    Start     Dose/Rate Route Frequency Ordered Stop   11/01/14 1330  ciprofloxacin (CIPRO) IVPB 400 mg     400 mg 200 mL/hr over 60 Minutes Intravenous Every 12 hours 11/01/14 1241     11/01/14 1330  metroNIDAZOLE (FLAGYL) IVPB 500 mg     500 mg 100 mL/hr over 60 Minutes Intravenous Every 8 hours 11/01/14 1241     11/01/14 1215  ciprofloxacin (CIPRO) IVPB 400 mg  Status:  Discontinued     400 mg 200 mL/hr over 60 Minutes Intravenous Every 12 hours 11/01/14 1213 11/01/14 1240   11/01/14 1215  metroNIDAZOLE (FLAGYL) IVPB 500 mg  Status:  Discontinued     500 mg 100 mL/hr over 60 Minutes Intravenous Every 8 hours 11/01/14 1213 11/01/14 1241      Results for orders placed or performed during the hospital encounter of 11/01/14 (from the past 48 hour(s))  CBC with Differential/Platelet     Status: Abnormal   Collection Time: 11/01/14 11:25 AM  Result Value Ref Range   WBC 31.8 (H) 4.0 - 10.5 K/uL   RBC 5.14 (H) 3.87 - 5.11 MIL/uL   Hemoglobin 11.6 (L) 12.0 - 15.0 g/dL   HCT 36.6 36.0 - 46.0 %   MCV 71.2 (L) 78.0 - 100.0 fL   MCH 22.6 (L) 26.0 - 34.0 pg   MCHC 31.7 30.0 - 36.0 g/dL   RDW 13.7 11.5 - 15.5 %   Platelets 100 (L) 150 - 400 K/uL    Comment: SPECIMEN CHECKED FOR CLOTS REPEATED TO VERIFY PLATELET COUNT CONFIRMED BY SMEAR    Neutrophils Relative % 8 (L) 43 - 77 %   Lymphocytes Relative 90 (H) 12 - 46 %   Monocytes Relative 2 (L) 3 - 12 %   Eosinophils Relative 0 0 - 5 %   Basophils Relative 0 0 - 1 %   Neutro Abs 2.5 1.7 - 7.7 K/uL   Lymphs Abs 28.7 (H) 0.7  - 4.0 K/uL   Monocytes Absolute 0.6 0.1 - 1.0 K/uL   Eosinophils Absolute 0.0 0.0 - 0.7 K/uL   Basophils Absolute 0.0 0.0 - 0.1 K/uL   WBC Morphology ABSOLUTE LYMPHOCYTOSIS     Comment: ATYPICAL LYMPHOCYTES  Basic metabolic panel     Status: Abnormal   Collection Time: 11/01/14 11:25 AM  Result  Value Ref Range   Sodium 130 (L) 135 - 145 mmol/L   Potassium 4.1 3.5 - 5.1 mmol/L   Chloride 96 (L) 101 - 111 mmol/L   CO2 25 22 - 32 mmol/L   Glucose, Bld 113 (H) 70 - 99 mg/dL   BUN 6 6 - 20 mg/dL   Creatinine, Ser 0.49 0.44 - 1.00 mg/dL   Calcium 8.6 (L) 8.9 - 10.3 mg/dL   GFR calc non Af Amer >60 >60 mL/min   GFR calc Af Amer >60 >60 mL/min    Comment: (NOTE) The eGFR has been calculated using the CKD EPI equation. This calculation has not been validated in all clinical situations. eGFR's persistently <90 mL/min signify possible Chronic Kidney Disease.    Anion gap 9 5 - 15    Ct Abdomen Pelvis Wo Contrast  10/31/2014   CLINICAL DATA:  Recent hospital admission for diverticulitis and abscess. Nausea and vomiting for 1 week, chronic constipation, weight loss.  EXAM: CT ABDOMEN AND PELVIS WITHOUT CONTRAST  TECHNIQUE: Multidetector CT imaging of the abdomen and pelvis was performed following the standard protocol without IV contrast.  COMPARISON:  MR abdomen 10/19/2014 and CT abdomen 10/15/2014.  FINDINGS: Lower chest: Lung bases show mild volume loss in the right middle lobe and lingula. Minimal scarring in the medial aspects of the lower lobes. Heart is at the upper limits of normal in size to mildly enlarged. No pericardial or pleural effusion.  Hepatobiliary: Liver and gallbladder are unremarkable. No biliary ductal dilatation.  Pancreas: Negative.  Spleen: 14.2 x 4.4 x 11.5 cm, upper normal in size.  Adrenals/Urinary Tract: Adrenal glands are unremarkable. Exophytic low-attenuation lesion off the right kidney measures 12 mm, shown to represent a cyst on 10/19/2014. Kidneys are otherwise  unremarkable. No urinary stones. Ureters are decompressed. Small amount of air is seen within the bladder, new.  Stomach/Bowel: Stomach, small bowel and majority of the colon are unremarkable. There is residual haziness about the proximal sigmoid colon with a persistent extraluminal collection of fluid and air between the sigmoid colon and bladder, measuring 2.5 cm (series 2, image 60). Small amount of air is seen within the bladder, raising suspicion for a fistula if there is no recent bladder catheterization.  Vascular/Lymphatic: Atherosclerotic calcification of the arterial vasculature without abdominal aortic aneurysm. Scattered lymph nodes are not enlarged by CT size criteria.  Reproductive: Calcified lesions in the uterus are likely fibroids. Ovaries are visualized.  Other: No free fluid. Mesenteries and peritoneum are unremarkable. Tiny periumbilical hernia contains fat.  Musculoskeletal: No worrisome lytic or sclerotic lesions. Degenerative changes are seen in the spine and hips. L2 compression fracture, as on 10/15/2014. Advanced degenerative disc disease at L5-S1.  IMPRESSION: Residual changes of sigmoid diverticulitis and associated abscess. New air in the bladder raises suspicion for a colonovesical fistula, in the absence of recent bladder catheterization.   Electronically Signed   By: Lorin Picket M.D.   On: 10/31/2014 08:17    Review of Systems  Constitutional: Negative.   HENT: Negative.   Eyes: Negative.   Cardiovascular: Negative.   Gastrointestinal: Negative.  Negative for nausea, vomiting and abdominal pain.  Genitourinary: Negative.   Musculoskeletal: Negative.   Skin: Negative.    Blood pressure 136/68, pulse 77, temperature 97.3 F (36.3 C), temperature source Oral, resp. rate 16, height _0  (1.422 m), weight 55.8 kg (123 lb 0.3 oz), SpO2 100 %. Physical Exam  Constitutional: She is oriented to person, place, and time. She appears well-developed  and well-nourished.  HENT:   Head: Normocephalic and atraumatic.  Eyes: Conjunctivae and EOM are normal. Pupils are equal, round, and reactive to light.  Neck: Normal range of motion. Neck supple.  Cardiovascular: Normal rate, regular rhythm and normal heart sounds.   Respiratory: Effort normal and breath sounds normal.  GI: Soft. Bowel sounds are normal. She exhibits no distension. There is no tenderness. There is no rebound and no guarding.  Musculoskeletal: Normal range of motion.  Neurological: She is alert and oriented to person, place, and time.  Skin: Skin is warm and dry.    Assessment/Plan: Pt has a h/o diverticulitis with hosp recently being Dc'd with abx.  Pt's recent CT scan shows con't sigmoid inflammation, pericolonic abscess which appears to be in the same spot as the last CT scan, and air within the bladder.  The pelvic abscess likely is not amenable to IR intervention similar to her last admission.  There is a high possibility that she could have a colo-vesical fistula secondary to the gas in her bladder.    Secondary to the fact that she has a benign abdomen and has been without symptoms since her last DC, I do not think there is any role for Emergent/Urgent for operation.  Our recommendation would be to treat her for this bout of diverticulitis with Abx. She would require colonoscopy 6-8 weeks after resolution to eval for possible CA.  We can see her in clinic and hopefully plan a one stage operation sigmoid resection and anast.      JENNINGS,WILLARD 11/01/2014, 2:34 PM

## 2014-11-01 NOTE — ED Notes (Signed)
Per pt's family, being treated for diverticulitis-had follow up CT yesterday and it showed fistula/abscess

## 2014-11-01 NOTE — H&P (Signed)
History and Physical   Stefanie Henry VQM:[id number] DOB: 11-Nov-1929 DOA: 11/01/2014  Referring physician: Dr. Kathrynn Humble PCP: [name], MD  Specialists: General Surgery, GI  Chief Complaint: abdominal abscess  HPI: Stefanie Henry is a 81 y.o. female has a past medical history significant for CLL, diverticulitis, Asthma, presents to the ED from home after bing instructed b GI to present to the ED. She was hospitalized about 2 weeks ago with abdominal discomfort, nausea, found to have colonic diverticulosis with evidence of mild inflammatory changes about the sigmoid colon most likely reflecting diverticulitis with a likely 2.4 cm contained perforation/small abscess between the sigmoid colon and bladder. General surgery was consulted, patient was started on antibiotics with clinical improvement and was discharged home on 4/23 to complete the course. She initially felt better however over the past 2 days she has been having more nausea and vomiting. She saw GI as an outpatient 2 days ago and had a repeat CT scan of her abdomen which showed possible colovesical fistula and was directed to come to the ED. She also endorses her chronic early satiety has been persistent and is bothering her daily. She reports that she gets very short of breath with that. She denies fevers or chills, denies chest pain. She endorses occasional lightheadedness. She has no abdominal pain or diarrhea. In the ED patient has stable vital signs, labs shows leukocytosis, anemia and thrombocytopenia which are chronic. [facility] asked for admission.   Review of Systems: as per HPI otherwise negative  Past Medical History  Diagnosis Date  . Asthma   . Arthritis   . Shingles   . Anemia, iron deficiency 06/06/2012  . Chronic lymphatic leukemia   . Gallstones   . Diverticulitis    Past Surgical History  Procedure Laterality Date  . None     Social History:  reports that she has never smoked. She has never used smokeless tobacco.  She reports that she does not drink alcohol or use illicit drugs.  Allergies  Allergen Reactions  . Contrast Media [Iodinated Diagnostic Agents] Other (See Comments)    Stomach discomfort  . Fruit & Vegetable Daily [Nutritional Supplements] Other (See Comments)    Citrus fruit causes stomach discomfort  . Ibuprofen Other (See Comments)    Stomach discomfort    Family History  Problem Relation Age of Onset  . Diabetes Father   . Stomach cancer Sister   . Colon cancer Neg Hx   . Colon polyps Neg Hx   . Esophageal cancer Neg Hx   . Gallbladder disease Mother     Prior to Admission medications   Medication Sig Start Date End Date Taking? Authorizing Provider  albuterol (PROVENTIL HFA;VENTOLIN HFA) 108 (90 BASE) MCG/ACT inhaler Inhale 1-2 puffs into the lungs every 6 (six) hours as needed for wheezing or shortness of breath.   Yes Historical Provider, MD  albuterol (PROVENTIL) (2.5 MG/3ML) 0.083% nebulizer solution Take 2.5 mg by nebulization every 6 (six) hours as needed for wheezing or shortness of breath.   Yes Historical Provider, MD  calcitonin, salmon, (MIACALCIN/FORTICAL) 200 UNIT/ACT nasal spray Place 1 spray into alternate nostrils daily. 06/15/14  Yes Daniel J Angiulli, PA-C  ciprofloxacin (CIPRO) 500 MG tablet Take 1 tablet (500 mg total) by mouth 2 (two) times daily. Patient taking differently: Take 500 mg by mouth 2 (two) times daily. For 10 days 10/20/14  Yes Costin Karlyne Greenspan, MD  famotidine (PEPCID) 20 MG tablet Take 1 tablet (20 mg total) by mouth daily.  Patient taking differently: Take 20 mg by mouth daily as needed for heartburn or indigestion.  06/15/14  Yes Daniel J Angiulli, PA-C  gabapentin (NEURONTIN) 100 MG capsule Take 1 capsule (100 mg total) by mouth daily. Patient taking differently: Take 100 mg by mouth daily as needed (for nerve pain).  06/15/14  Yes Daniel J Angiulli, PA-C  metroNIDAZOLE (FLAGYL) 500 MG tablet Take 1 tablet (500 mg total) by mouth every 8  (eight) hours. Patient taking differently: Take 500 mg by mouth every 8 (eight) hours. For 10 days 10/20/14  Yes Costin Karlyne Greenspan, MD  omeprazole (PRILOSEC) 20 MG capsule Take 20 mg by mouth daily.   Yes Historical Provider, MD  pantoprazole (PROTONIX) 40 MG tablet Take 1 tablet (40 mg total) by mouth daily. Take 30-60 min before first meal of the day 10/05/14  Yes Tanda Rockers, MD  predniSONE (DELTASONE) 10 MG tablet TAKE 1 TABLET BY MOUTH ONCE DAILY Patient taking differently: TAKE 1 TABLET BY MOUTH ONCE DAILY as needed for CLL 10/03/14  Yes [name], MD  senna (SENOKOT) 8.6 MG TABS tablet Take 1-2 tablets by mouth daily as needed for mild constipation.   Yes Historical Provider, MD  Simethicone (GAS-X PO) Take 2 capsules by mouth daily.    Yes Historical Provider, MD   Physical Exam: Filed Vitals:   11/01/14 1027 11/01/14 1237  BP: 111/55 136/68  Pulse: 87 77  Temp: 98.5 F (36.9 C) 97.3 F (36.3 C)  TempSrc: Oral Oral  Resp: 16 16  Height:  4\' 8"  (1.422 m)  Weight:  55.8 kg (123 lb 0.3 oz)  SpO2: 98% 100%    General:  No apparent distress, pleasant  Eyes: PERRL, no scleral icterus  ENT: moist oropharynx  Neck: supple, no lymphadenopathy  Cardiovascular: regular rate without MRG; 2+ peripheral pulses, no JVD, no peripheral edema  Respiratory: CTA biL, good air movement without wheezing, rhonchi or crackled  Abdomen: soft, non tender to palpation, positive bowel sounds  Skin: no rashes  Musculoskeletal: normal bulk and tone, no joint swelling  Psychiatric: normal mood and affect  Neurologic: non focal   Labs on Admission:  Basic Metabolic Panel:  Recent Labs Lab 10/30/14 1455 11/01/14 1125  NA 129* 130*  K 3.8 4.1  CL 95* 96*  CO2 29 25  GLUCOSE 99 113*  BUN 5* 6  CREATININE 0.63 0.49  CALCIUM 8.5 8.6*   Liver Function Tests:  Recent Labs Lab 10/30/14 1455  AST 13  ALT 13  ALKPHOS 76  BILITOT 0.5  PROT 5.6*  ALBUMIN 3.6   CBC:  Recent  Labs Lab 10/30/14 1455 11/01/14 1125  WBC 29.6 Repeated and verified X2.* 31.8*  NEUTROABS 3.4 2.5  HGB 11.3* 11.6*  HCT 35.4* 36.6  MCV 71.3* 71.2*  PLT 123.0* 100*    ProBNP (last 3 results)  Recent Labs  10/05/14 1142  PROBNP 81.0   Rdiological Exams on Admission: Ct Abdomen Pelvis Wo Contrast  10/31/2014   CLINICAL DATA:  Recent hospital admission for diverticulitis and abscess. Nausea and vomiting for 1 week, chronic constipation, weight loss.  EXAM: CT ABDOMEN AND PELVIS WITHOUT CONTRAST  TECHNIQUE: Multidetector CT imaging of the abdomen and pelvis was performed following the standard protocol without IV contrast.  COMPARISON:  MR abdomen 10/19/2014 and CT abdomen 10/15/2014.  FINDINGS: Lower chest: Lung bases show mild volume loss in the right middle lobe and lingula. Minimal scarring in the medial aspects of the lower lobes. Heart  is at the upper limits of normal in size to mildly enlarged. No pericardial or pleural effusion.  Hepatobiliary: Liver and gallbladder are unremarkable. No biliary ductal dilatation.  Pancreas: Negative.  Spleen: 14.2 x 4.4 x 11.5 cm, upper normal in size.  Adrenals/Urinary Tract: Adrenal glands are unremarkable. Exophytic low-attenuation lesion off the right kidney measures 12 mm, shown to represent a cyst on 10/19/2014. Kidneys are otherwise unremarkable. No urinary stones. Ureters are decompressed. Small amount of air is seen within the bladder, new.  Stomach/Bowel: Stomach, small bowel and majority of the colon are unremarkable. There is residual haziness about the proximal sigmoid colon with a persistent extraluminal collection of fluid and air between the sigmoid colon and bladder, measuring 2.5 cm (series 2, image 60). Small amount of air is seen within the bladder, raising suspicion for a fistula if there is no recent bladder catheterization.  Vascular/Lymphatic: Atherosclerotic calcification of the arterial vasculature without abdominal aortic  aneurysm. Scattered lymph nodes are not enlarged by CT size criteria.  Reproductive: Calcified lesions in the uterus are likely fibroids. Ovaries are visualized.  Other: No free fluid. Mesenteries and peritoneum are unremarkable. Tiny periumbilical hernia contains fat.  Musculoskeletal: No worrisome lytic or sclerotic lesions. Degenerative changes are seen in the spine and hips. L2 compression fracture, as on 10/15/2014. Advanced degenerative disc disease at L5-S1.  IMPRESSION: Residual changes of sigmoid diverticulitis and associated abscess. New air in the bladder raises suspicion for a colonovesical fistula, in the absence of recent bladder catheterization.   Electronically Signed   By: Lorin Picket M.D.   On: 10/31/2014 08:17   Assessment/Plan Active Problems:   Chronic lymphocytic leukemia   Dyspnea   Diverticulitis   Abscess, abdomen   Hyponatremia   Diverticulitis of large intestine with perforation and abscess without bleeding   Thrombocytopenia   Dysphagia   Sigmoid diverticulitis with abscess - repeat CT yesterday showed new air in bladder ?colonovesical fistula - abscess is persistent, measuring 2.5 cm - will continue antibiotics, they have not finished the course, Metronidazole / Ciprofloxacin IV - general surgery consulted, appreciate input  CLL - with associated leukocytosis - continue Prednisone  Dysphagia / early satiety - GI consulted as well, patient now reports weight loss  Hyponatremia - stable, mild dehydration - IVF for 12 h  Thrombocytopenia - chronic, stable  ?Asthma - no wheezing, resume albuterol  Diet: NPO Fluids: NS DVT Prophylaxis: Lovenox  Code Status: Full  Family Communication: d/w son bedside  Disposition Plan: admit to medsurg  Time spent: 37  Costin M. Cruzita Lederer, MD [facility] Pager [phone]  If 7PM-7AM, please contact night-coverage [email or web address] Password [email or web address] 11/01/2014, 12:42 PM

## 2014-11-01 NOTE — ED Notes (Signed)
Bed: WA04 Expected date:  Expected time:  Means of arrival:  Comments: EMS- elderly, dysuria, recent UTI

## 2014-11-02 DIAGNOSIS — E440 Moderate protein-calorie malnutrition: Secondary | ICD-10-CM | POA: Insufficient documentation

## 2014-11-02 DIAGNOSIS — K651 Peritoneal abscess: Secondary | ICD-10-CM

## 2014-11-02 DIAGNOSIS — C9110 Chronic lymphocytic leukemia of B-cell type not having achieved remission: Secondary | ICD-10-CM

## 2014-11-02 HISTORY — DX: Moderate protein-calorie malnutrition: E44.0

## 2014-11-02 LAB — COMPREHENSIVE METABOLIC PANEL
ALBUMIN: 3.1 g/dL — AB (ref 3.5–5.0)
ALT: 13 U/L — ABNORMAL LOW (ref 14–54)
AST: 16 U/L (ref 15–41)
Alkaline Phosphatase: 54 U/L (ref 38–126)
Anion gap: 7 (ref 5–15)
BUN: <5 mg/dL — ABNORMAL LOW (ref 6–20)
CALCIUM: 8.1 mg/dL — AB (ref 8.9–10.3)
CO2: 23 mmol/L (ref 22–32)
Chloride: 103 mmol/L (ref 101–111)
Creatinine, Ser: 0.52 mg/dL (ref 0.44–1.00)
GFR calc Af Amer: >60 mL/min (ref >60)
GFR calc non Af Amer: >60 mL/min (ref >60)
Glucose, Bld: 138 mg/dL — ABNORMAL HIGH (ref 70–99)
POTASSIUM: 3.4 mmol/L — AB (ref 3.5–5.1)
Sodium: 133 mmol/L — ABNORMAL LOW (ref 135–145)
Total Bilirubin: 0.4 mg/dL (ref 0.3–1.2)
Total Protein: 4.7 g/dL — ABNORMAL LOW (ref 6.5–8.1)

## 2014-11-02 LAB — CBC
HCT: 32 % — ABNORMAL LOW (ref 36.0–46.0)
HEMOGLOBIN: 10.3 g/dL — AB (ref 12.0–15.0)
MCH: 22.9 pg — ABNORMAL LOW (ref 26.0–34.0)
MCHC: 32.2 g/dL (ref 30.0–36.0)
MCV: 71.3 fL — ABNORMAL LOW (ref 78.0–100.0)
PLATELETS: 99 10*3/uL — AB (ref 150–400)
RBC: 4.49 MIL/uL (ref 3.87–5.11)
RDW: 13.9 % (ref 11.5–15.5)
WBC: 22 10*3/uL — ABNORMAL HIGH (ref 4.0–10.5)

## 2014-11-02 LAB — URINE CULTURE: Colony Count: 2000

## 2014-11-02 MED ORDER — POTASSIUM CHLORIDE CRYS ER 20 MEQ PO TBCR
40.0000 meq | EXTENDED_RELEASE_TABLET | Freq: Once | ORAL | Status: AC
  Administered 2014-11-02: 40 meq via ORAL
  Filled 2014-11-02: qty 2

## 2014-11-02 MED ORDER — SODIUM CHLORIDE 0.9 % IV SOLN
Freq: Once | INTRAVENOUS | Status: AC
  Administered 2014-11-02: 04:00:00 via INTRAVENOUS

## 2014-11-02 MED ORDER — SACCHAROMYCES BOULARDII 250 MG PO CAPS
250.0000 mg | ORAL_CAPSULE | Freq: Two times a day (BID) | ORAL | Status: DC
Start: 2014-11-02 — End: 2014-11-13
  Administered 2014-11-02 – 2014-11-13 (×22): 250 mg via ORAL
  Filled 2014-11-02 (×24): qty 1

## 2014-11-02 MED ORDER — PREDNISONE 5 MG PO TABS
5.0000 mg | ORAL_TABLET | Freq: Every day | ORAL | Status: DC
  Administered 2014-11-03 – 2014-11-13 (×11): 5 mg via ORAL
  Filled 2014-11-02 (×12): qty 1

## 2014-11-02 MED ORDER — ENSURE ENLIVE PO LIQD
237.0000 mL | Freq: Two times a day (BID) | ORAL | Status: DC
  Administered 2014-11-02 – 2014-11-13 (×17): 237 mL via ORAL

## 2014-11-02 MED ORDER — ACETAMINOPHEN 325 MG PO TABS
650.0000 mg | ORAL_TABLET | Freq: Four times a day (QID) | ORAL | Status: DC | PRN
  Administered 2014-11-02: 650 mg via ORAL
  Filled 2014-11-02 (×2): qty 2

## 2014-11-02 MED ORDER — ONDANSETRON HCL 4 MG/2ML IJ SOLN
4.0000 mg | Freq: Four times a day (QID) | INTRAMUSCULAR | Status: DC | PRN
  Administered 2014-11-02 – 2014-11-03 (×2): 4 mg via INTRAVENOUS
  Filled 2014-11-02 (×2): qty 2

## 2014-11-02 NOTE — Progress Notes (Signed)
PROGRESS NOTE  Stefanie Henry FWY:[id number] DOB: Apr 23, 1930 DOA: 11/01/2014 PCP: [name], MD  HPI: 81 y.o. female has a past medical history significant for CLL, diverticulitis, Asthma, presents to the ED from home after bing instructed b GI to present to the ED as she has persistent abscess and colovesical fistula.   Subjective / 24 H Interval events - complains of bloating this morning - no nausea/vomiting - denies abdominal pain  Assessment/Plan: Active Problems:   Chronic lymphocytic leukemia   Dyspnea   Diverticulitis   Abscess, abdomen   Hyponatremia   Diverticulitis of large intestine with perforation and abscess without bleeding   Thrombocytopenia   Dysphagia   Malnutrition of moderate degree   Sigmoid diverticulitis with abscess - repeat CT as an outpatient showed new air in bladder ?colonovesical fistula - abscess is persistent, measuring 2.5 cm - general surgery consulted, recommending IV antibiotics, no need for intervention for now - gastroenterology following, discussed with them, recommending close monitoring with 5-7 days IV Abx prior to consider po transition - son told me that her oral antibiotics were making her nauseous at home and was barely able to tolerate them. I am not clear whether she skipped doses or not  CLL - with associated leukocytosis - discussed with Dr. [name] over the phone today, she was on long standing prednisone, will likely not need it anymore but he recommends a slow taper given prolonged expoure. Will change to 5 mg daily for now.   Dysphagia / early satiety - GI consulted as well, patient now reports weight loss - no planned interventions for now  Hyponatremia - stable, mild dehydration - improved with hydration  Thrombocytopenia - chronic, stable  ?Asthma - no wheezing, resume albuterol   Diet: Diet Heart Room service appropriate?: Yes; Fluid consistency:: Thin Fluids: none DVT Prophylaxis: Lovenox  Code  Status: Full Code Family Communication: d/w son over the phone  Disposition Plan: remain inpatient  Consultants:  GI  General surgery   Procedures:  None    Antibiotics Ciprofloxacin 5/5 >> Metronidazole 5/5 >>   Studies  No results found.  Objective  Filed Vitals:   11/01/14 1027 11/01/14 1237 11/01/14 2036 11/02/14 0534  BP: 111/55 136/68 124/67 99/53  Pulse: 87 77 74 82  Temp: 98.5 F (36.9 C) 97.3 F (36.3 C) 98 F (36.7 C) 98.1 F (36.7 C)  TempSrc: Oral Oral Oral Oral  Resp: 16 16 16 16   Height:  4\' 8"  (1.422 m)    Weight:  55.8 kg (123 lb 0.3 oz)    SpO2: 98% 100% 99% 97%    Intake/Output Summary (Last 24 hours) at 11/02/14 1428 Last data filed at 11/02/14 1018  Gross per 24 hour  Intake 1874.17 ml  Output   1650 ml  Net 224.17 ml   Filed Weights   11/01/14 1237  Weight: 55.8 kg (123 lb 0.3 oz)    Exam:  General:  NAD  HEENT: no scleral icterus, PERRL  Cardiovascular: RRR without MRG, 2+ peripheral pulses, no edema  Respiratory: CTA biL, good air movement, no wheezing, no crackles, no rales  Abdomen: soft, non tender, BS +, no guarding  MSK/Extremities: no clubbing/cyanosis, no joint swelling  Skin: no rashes  Neuro: non focal, strength 5/5 in all 4   Data Reviewed: Basic Metabolic Panel:  Recent Labs Lab 10/30/14 1455 11/01/14 1125 11/02/14 0420  NA 129* 130* 133*  K 3.8 4.1 3.4*  CL 95* 96* 103  CO2 29 25 23  GLUCOSE 99 113* 138*  BUN 5* 6 <5*  CREATININE 0.63 0.49 0.52  CALCIUM 8.5 8.6* 8.1*   Liver Function Tests:  Recent Labs Lab 10/30/14 1455 11/02/14 0420  AST 13 16  ALT 13 13*  ALKPHOS 76 54  BILITOT 0.5 0.4  PROT 5.6* 4.7*  ALBUMIN 3.6 3.1*   CBC:  Recent Labs Lab 10/30/14 1455 11/01/14 1125 11/02/14 0420  WBC 29.6 Repeated and verified X2.* 31.8* 22.0*  NEUTROABS 3.4 2.5  --   HGB 11.3* 11.6* 10.3*  HCT 35.4* 36.6 32.0*  MCV 71.3* 71.2* 71.3*  PLT 123.0* 100* 99*   ProBNP (last 3  results)  Recent Labs  10/05/14 1142  PROBNP 81.0   Scheduled Meds: . ciprofloxacin  400 mg Intravenous Q12H  . enoxaparin (LOVENOX) injection  40 mg Subcutaneous Q24H  . feeding supplement (ENSURE ENLIVE)  237 mL Oral BID BM  . gabapentin  100 mg Oral Daily  . metronidazole  500 mg Intravenous Q8H  . pantoprazole  40 mg Oral Daily  . predniSONE  10 mg Oral Daily  . saccharomyces boulardii  250 mg Oral BID   Time spent: 25 minutes  Continuous Infusions:   Marzetta Board, MD [facility] Pager [phone]. If 7 PM - 7 AM, please contact night-coverage at [email or web address], password [facility] 11/02/2014, 2:28 PM  LOS: 1 day

## 2014-11-02 NOTE — ED Provider Notes (Signed)
CSN: [id number]     Arrival date & time 11/01/14  1013 History   First MD Initiated Contact with Patient 11/01/14 1022     Chief Complaint  Patient presents with  . abdominal abscess      (Consider location/radiation/quality/duration/timing/severity/associated sxs/prior Treatment) HPI Comments: Pt comes in with of abd pain. She has hx of diverticulitis, and abscess, discharged recently with conservative management. Pt is being treated for the diagnosis outpatient, and saw her GI for f/u yday, had a f/u CT ordered, which shows air in the bladder. GI team suspects likely colo-vesicular fistula, and advised pt to get admitted. Pt has constant mild nagging pain, with intermittent flare up. No n/v/f/c/uti like sx. Occasionally bubbles in her urine.   The history is provided by the patient.    Past Medical History  Diagnosis Date  . Asthma   . Arthritis   . Shingles   . Anemia, iron deficiency 06/06/2012  . Chronic lymphatic leukemia   . Gallstones   . Diverticulitis    Past Surgical History  Procedure Laterality Date  . None     Family History  Problem Relation Age of Onset  . Diabetes Father   . Stomach cancer Sister   . Colon cancer Neg Hx   . Colon polyps Neg Hx   . Esophageal cancer Neg Hx   . Gallbladder disease Mother    History  Substance Use Topics  . Smoking status: Never Smoker   . Smokeless tobacco: Never Used     Comment: PT NEVER SMOKED  . Alcohol Use: No   OB History    No data available     Review of Systems  Constitutional: Negative for activity change.  Respiratory: Negative for shortness of breath.   Cardiovascular: Negative for chest pain.  Gastrointestinal: Positive for abdominal pain. Negative for nausea and vomiting.  Genitourinary: Negative for dysuria.      Allergies  Contrast media; Fruit & vegetable daily; and Ibuprofen  Home Medications   Prior to Admission medications   Medication Sig Start Date End Date Taking? Authorizing  Provider  albuterol (PROVENTIL HFA;VENTOLIN HFA) 108 (90 BASE) MCG/ACT inhaler Inhale 1-2 puffs into the lungs every 6 (six) hours as needed for wheezing or shortness of breath.   Yes Historical Provider, MD  albuterol (PROVENTIL) (2.5 MG/3ML) 0.083% nebulizer solution Take 2.5 mg by nebulization every 6 (six) hours as needed for wheezing or shortness of breath.   Yes Historical Provider, MD  calcitonin, salmon, (MIACALCIN/FORTICAL) 200 UNIT/ACT nasal spray Place 1 spray into alternate nostrils daily. 06/15/14  Yes Daniel J Angiulli, PA-C  ciprofloxacin (CIPRO) 500 MG tablet Take 1 tablet (500 mg total) by mouth 2 (two) times daily. Patient taking differently: Take 500 mg by mouth 2 (two) times daily. For 10 days 10/20/14  Yes Costin Karlyne Greenspan, MD  famotidine (PEPCID) 20 MG tablet Take 1 tablet (20 mg total) by mouth daily. Patient taking differently: Take 20 mg by mouth daily as needed for heartburn or indigestion.  06/15/14  Yes Daniel J Angiulli, PA-C  gabapentin (NEURONTIN) 100 MG capsule Take 1 capsule (100 mg total) by mouth daily. Patient taking differently: Take 100 mg by mouth daily as needed (for nerve pain).  06/15/14  Yes Daniel J Angiulli, PA-C  metroNIDAZOLE (FLAGYL) 500 MG tablet Take 1 tablet (500 mg total) by mouth every 8 (eight) hours. Patient taking differently: Take 500 mg by mouth every 8 (eight) hours. For 10 days 10/20/14  Yes [place],  MD  omeprazole (PRILOSEC) 20 MG capsule Take 20 mg by mouth daily.   Yes Historical Provider, MD  pantoprazole (PROTONIX) 40 MG tablet Take 1 tablet (40 mg total) by mouth daily. Take 30-60 min before first meal of the day 10/05/14  Yes Tanda Rockers, MD  predniSONE (DELTASONE) 10 MG tablet TAKE 1 TABLET BY MOUTH ONCE DAILY Patient taking differently: TAKE 1 TABLET BY MOUTH ONCE DAILY as needed for CLL 10/03/14  Yes [name], MD  senna (SENOKOT) 8.6 MG TABS tablet Take 1-2 tablets by mouth daily as needed for mild constipation.   Yes  Historical Provider, MD  Simethicone (GAS-X PO) Take 2 capsules by mouth daily.    Yes Historical Provider, MD   BP 99/53 mmHg  Pulse 82  Temp(Src) 98.1 F (36.7 C) (Oral)  Resp 16  Ht 4\' 8"  (1.422 m)  Wt 123 lb 0.3 oz (55.8 kg)  BMI 27.60 kg/m2  SpO2 97% Physical Exam  Constitutional: She is oriented to person, place, and time. She appears well-developed and well-nourished.  HENT:  Head: Normocephalic and atraumatic.  Eyes: EOM are normal. Pupils are equal, round, and reactive to light.  Neck: Neck supple.  Cardiovascular: Normal rate, regular rhythm and normal heart sounds.   No murmur heard. Pulmonary/Chest: Effort normal. No respiratory distress.  Abdominal: Soft. She exhibits distension. There is tenderness. There is no rebound and no guarding.  Neurological: She is alert and oriented to person, place, and time.  Skin: Skin is warm and dry.  Nursing note and vitals reviewed.   ED Course  Procedures (including critical care time) Labs Review Labs Reviewed  CBC WITH DIFFERENTIAL/PLATELET - Abnormal; Notable for the following:    WBC 31.8 (*)    RBC 5.14 (*)    Hemoglobin 11.6 (*)    MCV 71.2 (*)    MCH 22.6 (*)    Platelets 100 (*)    Neutrophils Relative % 8 (*)    Lymphocytes Relative 90 (*)    Monocytes Relative 2 (*)    Lymphs Abs 28.7 (*)    All other components within normal limits  BASIC METABOLIC PANEL - Abnormal; Notable for the following:    Sodium 130 (*)    Chloride 96 (*)    Glucose, Bld 113 (*)    Calcium 8.6 (*)    All other components within normal limits  URINALYSIS, ROUTINE W REFLEX MICROSCOPIC - Abnormal; Notable for the following:    Hgb urine dipstick TRACE (*)    Leukocytes, UA MODERATE (*)    All other components within normal limits  COMPREHENSIVE METABOLIC PANEL - Abnormal; Notable for the following:    Sodium 133 (*)    Potassium 3.4 (*)    Glucose, Bld 138 (*)    BUN <5 (*)    Calcium 8.1 (*)    Total Protein 4.7 (*)    Albumin  3.1 (*)    ALT 13 (*)    All other components within normal limits  CBC - Abnormal; Notable for the following:    WBC 22.0 (*)    Hemoglobin 10.3 (*)    HCT 32.0 (*)    MCV 71.3 (*)    MCH 22.9 (*)    Platelets 99 (*)    All other components within normal limits  URINE CULTURE  URINE MICROSCOPIC-ADD ON    Imaging Review No results found.   EKG Interpretation None      MDM   Final diagnoses:  Diverticulitis of large intestine with abscess without bleeding  Colovesical fistula    Pt comes in with cc of abnormal CT. Has colo-vesicular fistula. Gen Surgery called. Medicine to admit.   Varney Biles, MD 11/02/14 [phone]

## 2014-11-02 NOTE — Care Management Note (Signed)
Case Management Note  Patient Details  Name: Stefanie Henry MRN: [id number] Date of Birth: 1930-03-15  Subjective/Objective:        81 yo admitted with Diverticulitis            Action/Plan: From home with children  Expected Discharge Date:                  Expected Discharge Plan:  Home/Self Care  In-House Referral:     Discharge planning Services  CM Consult  Post Acute Care Choice:    Choice offered to:     DME Arranged:    DME Agency:     HH Arranged:    HH Agency:     Status of Service:  In process, will continue to follow  Medicare Important Message Given:    Date Medicare IM Given:    Medicare IM give by:    Date Additional Medicare IM Given:    Additional Medicare Important Message give by:     If discussed at [place] of Stay Meetings, dates discussed:    Additional Comments: Chart reviewed and CM following for DC needs. Pt has the following equipment at home: Shower chair with back, [employer].  Pt could benefit from PT consult to help with disposition planning.   Lynnell Catalan, RN 11/02/2014, 3:12 PM

## 2014-11-02 NOTE — Progress Notes (Addendum)
     [facility] Progress Note  Subjective:   WBC 22.Afeb.Denies abd pain, just feels very bloated and full. No N/V.LFTs normal.   Objective:  Vital signs in last 24 hours: Temp:  [97.3 F (36.3 C)-98.5 F (36.9 C)] 98.1 F (36.7 C) (05/06 0534) Pulse Rate:  [74-87] 82 (05/06 0534) Resp:  [16] 16 (05/06 0534) BP: (99-136)/(53-68) 99/53 mmHg (05/06 0534) SpO2:  [97 %-100 %] 97 % (05/06 0534) Weight:  [123 lb 0.3 oz (55.8 kg)] 123 lb 0.3 oz (55.8 kg) (05/05 1237) Last BM Date: 11/01/14 General:   Alert,  Well-developed,    in NAD Heart:  Regular rate and rhythm; no murmurs Pulm;lungs clear Abdomen:  Soft, nontender and nondistended. Normal bowel sounds, without guarding, and without rebound.   Extremities:  Without edema. Neurologic:Alert and  oriented x4;  grossly normal neurologically. Psych: Alert and cooperative. Normal mood and affect.  Lab Results:  Recent Labs  10/30/14 1455 11/01/14 1125 11/02/14 0420  WBC 29.6 Repeated and verified X2.* 31.8* 22.0*  HGB 11.3* 11.6* 10.3*  HCT 35.4* 36.6 32.0*  PLT 123.0* 100* 99*   BMET  Recent Labs  10/30/14 1455 11/01/14 1125 11/02/14 0420  NA 129* 130* 133*  K 3.8 4.1 3.4*  CL 95* 96* 103  CO2 29 25 23   GLUCOSE 99 113* 138*  BUN 5* 6 <5*  CREATININE 0.63 0.49 0.52  CALCIUM 8.5 8.6* 8.1*   LFT  Recent Labs  11/02/14 0420  PROT 4.7*  ALBUMIN 3.1*  AST 16  ALT 13*  ALKPHOS 31  BILITOT 0.4     ASSESSMENT/PLAN:  81 yo female with diverticulitis with abscess and colovesicular fistula. On IV antibiotics, no pain, afeb. Continue IV antibiotics. Will  sched f/u in GI office in about 4 weeks to schedule colonoscopy 6-8 weeks after resolution.      LOS: 1 day   Hvozdovic, Vita Barley PA-C 11/02/2014, Pager [phone]   ________________________________________________________________________  Velora Heckler GI MD note:  I personally examined the patient, reviewed the data and agree with the assessment  and plan described above.  She was sleeping when I arrived and I did not awaken her.  We will return to see her on Monday, I recommend she stay on IV at least until that point and probably for 5-7 days total before transitioning to oral abx.   She is on prednisone 10mg  daily, for her CLL I believe. Would check with Dr. [name] if it is OK to stop this for a while, I suspect it is hindering her diverticulitis care at least somewhat.   Owens Loffler, MD [facility] Pager [phone]

## 2014-11-02 NOTE — Progress Notes (Signed)
Initial Nutrition Assessment  DOCUMENTATION CODES:  Non-severe (moderate) malnutrition in context of chronic illness  INTERVENTION:  Ensure Enlive (each supplement provides 350kcal and 20 grams of protein) BID  NUTRITION DIAGNOSIS:  Malnutrition related to chronic illness as evidenced by mild depletion of body fat, moderate depletions of muscle mass.  GOAL:  Patient will meet greater than or equal to 90% of their needs  MONITOR:  PO intake, Supplement acceptance, Diet advancement, Weight trends  REASON FOR ASSESSMENT:  Malnutrition Screening Tool    ASSESSMENT: Stefanie Henry is a 81 y.o. female has a past medical history significant for CLL, diverticulitis, Asthma. She was hospitalized about 2 weeks ago with abdominal discomfort, nausea, found to have colonic diverticulosis with evidence of mild inflammatory changes about the sigmoid colon most likely reflecting diverticulitis. She initially felt better however over the past 2 days she has been having more nausea and vomiting. She saw GI as an outpatient 2 days ago and had a repeat CT scan of her abdomen which showed possible colovesical fistula and was directed to come to the ED. She also endorses her chronic early satiety has been persistent and is bothering her daily. She reports that she gets very short of breath with that.   Pt states that she has been on a liquid diet for some time (more that 2 weeks, but not certain how long) due to ongoing GI problems. Per wt history her weight has been stable x1 yr. She states that her appetite varies and she can only take a few bites before feeling full. Current PO on full liquid diet is 20 - 50%. She takes Ensure at home and would like to continue while here. RD to order. Pt stated she can only drink one as it is too sweet, but suggested diluting it with milk and she agreed to 2 Ensure Enlive per day. Asked pt if she received prior education on diverticulitis diet, but pt was not able to  understand very well, possibly due to language barrier. Asked about it in several ways, but she just kept repeating that she has been on liquid diet for her "stomach problems". Will continue to monitor. Labs reviewed: Na 133, K 3.4, BUN <5, Ca 8.1, Glu 138, Alb 3.1    Height:  Ht Readings from Last 1 Encounters:  11/01/14 4\' 8"  (1.422 m)    Weight:  Wt Readings from Last 1 Encounters:  11/01/14 123 lb 0.3 oz (55.8 kg)    Ideal Body Weight:  42.4 kg  Wt Readings from Last 10 Encounters:  11/01/14 123 lb 0.3 oz (55.8 kg)  10/30/14 125 lb 3.2 oz (56.79 kg)  10/16/14 125 lb (56.7 kg)  10/05/14 128 lb (58.06 kg)  08/20/14 127 lb (57.607 kg)  06/13/14 134 lb 4.8 oz (60.918 kg)  06/09/14 128 lb 4.8 oz (58.196 kg)  11/27/13 126 lb (57.153 kg)  10/05/13 124 lb 12.8 oz (56.609 kg)  01/29/13 109 lb (49.442 kg)    BMI:  Body mass index is 27.6 kg/(m^2).  Estimated Nutritional Needs:  Kcal:  1000 - 1200  Protein:  50 - 60 g  Fluid:  >1.0 L  Skin:  Reviewed, no issues (rash on foot)  Diet Order:  Diet Heart Room service appropriate?: Yes; Fluid consistency:: Thin  EDUCATION NEEDS:  Education needs addressed   Intake/Output Summary (Last 24 hours) at 11/02/14 1144 Last data filed at 11/02/14 1018  Gross per 24 hour  Intake 1874.17 ml  Output   2050 ml  Net -175.83 ml    Last BM:  5/05 (watery)  [name] A. [place] Dietetic Intern Pager: [phone] 11/02/2014 11:56 AM

## 2014-11-02 NOTE — Telephone Encounter (Signed)
Ok...finally

## 2014-11-02 NOTE — Progress Notes (Signed)
  Subjective: Pt with no pain this AM.  +BM  Objective: Vital signs in last 24 hours: Temp:  [97.3 F (36.3 C)-98.5 F (36.9 C)] 98.1 F (36.7 C) (05/06 0534) Pulse Rate:  [74-87] 82 (05/06 0534) Resp:  [16] 16 (05/06 0534) BP: (99-136)/(53-68) 99/53 mmHg (05/06 0534) SpO2:  [97 %-100 %] 97 % (05/06 0534) Weight:  [55.8 kg (123 lb 0.3 oz)] 55.8 kg (123 lb 0.3 oz) (05/05 1237) Last BM Date: 11/01/14  Intake/Output from previous day: 05/05 0701 - 05/06 0700 In: 1754.2 [P.O.:500; I.V.:1254.2] Out: 2050 [Urine:2050] Intake/Output this shift:    General appearance: alert and cooperative GI: benign abd exam  Lab Results:   Recent Labs  11/01/14 1125 11/02/14 0420  WBC 31.8* 22.0*  HGB 11.6* 10.3*  HCT 36.6 32.0*  PLT 100* 99*   BMET  Recent Labs  11/01/14 1125 11/02/14 0420  NA 130* 133*  K 4.1 3.4*  CL 96* 103  CO2 25 23  GLUCOSE 113* 138*  BUN 6 <5*  CREATININE 0.49 0.52  CALCIUM 8.6* 8.1*    Anti-infectives: Anti-infectives    Start     Dose/Rate Route Frequency Ordered Stop   11/01/14 1330  ciprofloxacin (CIPRO) IVPB 400 mg     400 mg 200 mL/hr over 60 Minutes Intravenous Every 12 hours 11/01/14 1241     11/01/14 1330  metroNIDAZOLE (FLAGYL) IVPB 500 mg     500 mg 100 mL/hr over 60 Minutes Intravenous Every 8 hours 11/01/14 1241     11/01/14 1215  ciprofloxacin (CIPRO) IVPB 400 mg  Status:  Discontinued     400 mg 200 mL/hr over 60 Minutes Intravenous Every 12 hours 11/01/14 1213 11/01/14 1240   11/01/14 1215  metroNIDAZOLE (FLAGYL) IVPB 500 mg  Status:  Discontinued     500 mg 100 mL/hr over 60 Minutes Intravenous Every 8 hours 11/01/14 1213 11/01/14 1241      Assessment/Plan: Diverticulitis with possible colovesical fistula Would cont' abx for her diverticulitis.   No acute surgical plans Will follow   LOS: 1 day    Rosario Jacks., Anne Hahn 11/02/2014

## 2014-11-03 MED ORDER — ENSURE PUDDING PO PUDG
1.0000 | Freq: Three times a day (TID) | ORAL | Status: DC
  Administered 2014-11-03 – 2014-11-12 (×20): 1 via ORAL
  Filled 2014-11-03 (×30): qty 1

## 2014-11-03 NOTE — Progress Notes (Signed)
PROGRESS NOTE  Stefanie Henry QAS:[id number] DOB: 03/14/1930 DOA: 11/01/2014 PCP: [name], MD  HPI: 81 y.o. female has a past medical history significant for CLL, diverticulitis, Asthma, presents to the ED from home after bing instructed b GI to present to the ED as she has persistent abscess and colovesical fistula.   Subjective / 24 H Interval events - ongoing abdominal bloating - no chest pain/dyspnea/abdominal pain  Assessment/Plan: Active Problems:   Chronic lymphocytic leukemia   Dyspnea   Diverticulitis   Abscess, abdomen   Hyponatremia   Diverticulitis of large intestine with perforation and abscess without bleeding   Thrombocytopenia   Dysphagia   Malnutrition of moderate degree   Sigmoid diverticulitis with abscess - repeat CT as an outpatient showed new air in bladder ?colonovesical fistula - abscess is persistent, measuring 2.5 cm - general surgery consulted, recommending IV antibiotics, no need for intervention for now - gastroenterology following, discussed with them, recommending close monitoring with 5-7 days IV Abx prior to consider po transition - son told me that her oral antibiotics were making her nauseous at home and was barely able to tolerate them. I am not clear whether she skipped doses or not - continue antibiotics through the weekend  CLL - with associated leukocytosis - discussed with Dr. [name] over the phone 5/6, she was on long standing prednisone, will likely not need it anymore but he recommends a slow taper given prolonged expoure - 5 mg daily    Dysphagia / early satiety - GI consulted as well, patient now reports weight loss - no planned interventions for now  Hyponatremia - stable, mild dehydration - improved with hydration  Thrombocytopenia - chronic, stable  ?Asthma - no wheezing, resume albuterol   Diet: Diet full liquid Room service appropriate?: Yes; Fluid consistency:: Thin Fluids: none DVT Prophylaxis:  Lovenox  Code Status: Full Code Family Communication: d/w son over the phone  Disposition Plan: remain inpatient  Consultants:  GI  General surgery   Procedures:  None    Antibiotics Ciprofloxacin 5/5 >> Metronidazole 5/5 >>   Studies  No results found.  Objective  Filed Vitals:   11/02/14 1436 11/02/14 2013 11/02/14 2111 11/03/14 0520  BP: 121/75  134/68 118/59  Pulse: 82 80 84 97  Temp: 98.4 F (36.9 C)  98 F (36.7 C) 98.3 F (36.8 C)  TempSrc: Oral  Oral Oral  Resp: 16  16 16   Height:      Weight:      SpO2: 100%  100% 97%    Intake/Output Summary (Last 24 hours) at 11/03/14 1100 Last data filed at 11/03/14 0601  Gross per 24 hour  Intake   2649 ml  Output   1050 ml  Net   1599 ml   Filed Weights   11/01/14 1237  Weight: 55.8 kg (123 lb 0.3 oz)    Exam:  General:  NAD  HEENT: no scleral icterus, PERRL  Cardiovascular: RRR without MRG, 2+ peripheral pulses, no edema  Respiratory: CTA biL, good air movement, no wheezing, no crackles, no rales  Abdomen: soft, non tender, BS +, no guarding  MSK/Extremities: no clubbing/cyanosis, no joint swelling  Skin: no rashes  Data Reviewed: Basic Metabolic Panel:  Recent Labs Lab 10/30/14 1455 11/01/14 1125 11/02/14 0420  NA 129* 130* 133*  K 3.8 4.1 3.4*  CL 95* 96* 103  CO2 29 25 23   GLUCOSE 99 113* 138*  BUN 5* 6 <5*  CREATININE 0.63 0.49 0.52  CALCIUM  8.5 8.6* 8.1*   Liver Function Tests:  Recent Labs Lab 10/30/14 1455 11/02/14 0420  AST 13 16  ALT 13 13*  ALKPHOS 76 54  BILITOT 0.5 0.4  PROT 5.6* 4.7*  ALBUMIN 3.6 3.1*   CBC:  Recent Labs Lab 10/30/14 1455 11/01/14 1125 11/02/14 0420  WBC 29.6 Repeated and verified X2.* 31.8* 22.0*  NEUTROABS 3.4 2.5  --   HGB 11.3* 11.6* 10.3*  HCT 35.4* 36.6 32.0*  MCV 71.3* 71.2* 71.3*  PLT 123.0* 100* 99*   ProBNP (last 3 results)  Recent Labs  10/05/14 1142  PROBNP 81.0   Scheduled Meds: . ciprofloxacin  400 mg  Intravenous Q12H  . enoxaparin (LOVENOX) injection  40 mg Subcutaneous Q24H  . feeding supplement (ENSURE ENLIVE)  237 mL Oral BID BM  . feeding supplement (ENSURE)  1 Container Oral TID BM  . gabapentin  100 mg Oral Daily  . metronidazole  500 mg Intravenous Q8H  . pantoprazole  40 mg Oral Daily  . predniSONE  5 mg Oral Q breakfast  . saccharomyces boulardii  250 mg Oral BID   Continuous Infusions:   Marzetta Board, MD [facility] Pager [phone]. If 7 PM - 7 AM, please contact night-coverage at [email or web address], password [facility] 11/03/2014, 11:00 AM  LOS: 2 days

## 2014-11-03 NOTE — Progress Notes (Signed)
  Subjective: Pt with crampy abd pain when eating solid diet.  Denies any vaginal drainage  Objective: Vital signs in last 24 hours: Temp:  [98 F (36.7 C)-98.4 F (36.9 C)] 98.3 F (36.8 C) (05/07 0520) Pulse Rate:  [80-97] 97 (05/07 0520) Resp:  [16] 16 (05/07 0520) BP: (118-134)/(59-75) 118/59 mmHg (05/07 0520) SpO2:  [97 %-100 %] 97 % (05/07 0520) Last BM Date: 11/02/14  Intake/Output from previous day: 05/06 0701 - 05/07 0700 In: 2769 [P.O.:390; I.V.:1665; IV Piggyback:400] Out: 1050 [Urine:1050] Intake/Output this shift:   General appearance: alert and cooperative GI: benign abd exam  Lab Results:   Recent Labs  11/01/14 1125 11/02/14 0420  WBC 31.8* 22.0*  HGB 11.6* 10.3*  HCT 36.6 32.0*  PLT 100* 99*   BMET  Recent Labs  11/01/14 1125 11/02/14 0420  NA 130* 133*  K 4.1 3.4*  CL 96* 103  CO2 25 23  GLUCOSE 113* 138*  BUN 6 <5*  CREATININE 0.49 0.52  CALCIUM 8.6* 8.1*    Anti-infectives: Anti-infectives    Start     Dose/Rate Route Frequency Ordered Stop   11/01/14 1330  ciprofloxacin (CIPRO) IVPB 400 mg     400 mg 200 mL/hr over 60 Minutes Intravenous Every 12 hours 11/01/14 1241     11/01/14 1330  metroNIDAZOLE (FLAGYL) IVPB 500 mg     500 mg 100 mL/hr over 60 Minutes Intravenous Every 8 hours 11/01/14 1241     11/01/14 1215  ciprofloxacin (CIPRO) IVPB 400 mg  Status:  Discontinued     400 mg 200 mL/hr over 60 Minutes Intravenous Every 12 hours 11/01/14 1213 11/01/14 1240   11/01/14 1215  metroNIDAZOLE (FLAGYL) IVPB 500 mg  Status:  Discontinued     500 mg 100 mL/hr over 60 Minutes Intravenous Every 8 hours 11/01/14 1213 11/01/14 1241      Assessment/Plan: Diverticulitis with possible colovesical fistula Would cont' abx for her diverticulitis.   No acute surgical plans Decreased to liquid diet and ensure for now to allow for colon to rest Will follow   LOS: 2 days    Laquinda Moller C. 0/01/6577

## 2014-11-04 DIAGNOSIS — E876 Hypokalemia: Secondary | ICD-10-CM

## 2014-11-04 DIAGNOSIS — D696 Thrombocytopenia, unspecified: Secondary | ICD-10-CM

## 2014-11-04 LAB — COMPREHENSIVE METABOLIC PANEL WITH GFR
ALT: 12 U/L — ABNORMAL LOW (ref 14–54)
AST: 16 U/L (ref 15–41)
Albumin: 3 g/dL — ABNORMAL LOW (ref 3.5–5.0)
Alkaline Phosphatase: 51 U/L (ref 38–126)
Anion gap: 6 (ref 5–15)
BUN: 6 mg/dL (ref 6–20)
CO2: 27 mmol/L (ref 22–32)
Calcium: 8.3 mg/dL — ABNORMAL LOW (ref 8.9–10.3)
Chloride: 102 mmol/L (ref 101–111)
Creatinine, Ser: 0.59 mg/dL (ref 0.44–1.00)
GFR calc Af Amer: >60 mL/min
GFR calc non Af Amer: >60 mL/min
Glucose, Bld: 106 mg/dL — ABNORMAL HIGH (ref 70–99)
Potassium: 3.3 mmol/L — ABNORMAL LOW (ref 3.5–5.1)
Sodium: 135 mmol/L (ref 135–145)
Total Bilirubin: 0.4 mg/dL (ref 0.3–1.2)
Total Protein: 4.6 g/dL — ABNORMAL LOW (ref 6.5–8.1)

## 2014-11-04 LAB — CBC
HCT: 30.4 % — ABNORMAL LOW (ref 36.0–46.0)
Hemoglobin: 9.8 g/dL — ABNORMAL LOW (ref 12.0–15.0)
MCH: 23 pg — ABNORMAL LOW (ref 26.0–34.0)
MCHC: 32.2 g/dL (ref 30.0–36.0)
MCV: 71.4 fL — ABNORMAL LOW (ref 78.0–100.0)
Platelets: 78 K/uL — ABNORMAL LOW (ref 150–400)
RBC: 4.26 MIL/uL (ref 3.87–5.11)
RDW: 14 % (ref 11.5–15.5)
WBC: 15.4 K/uL — ABNORMAL HIGH (ref 4.0–10.5)

## 2014-11-04 MED ORDER — POTASSIUM CHLORIDE 20 MEQ PO PACK
40.0000 meq | PACK | Freq: Once | ORAL | Status: DC
  Filled 2014-11-04: qty 2

## 2014-11-04 MED ORDER — POTASSIUM CHLORIDE CRYS ER 20 MEQ PO TBCR
40.0000 meq | EXTENDED_RELEASE_TABLET | Freq: Once | ORAL | Status: AC
  Administered 2014-11-04: 40 meq via ORAL
  Filled 2014-11-04: qty 2

## 2014-11-04 NOTE — Progress Notes (Signed)
  Subjective: Pt states pain better with liquids  Objective: Vital signs in last 24 hours: Temp:  [97.6 F (36.4 C)-98.5 F (36.9 C)] 97.6 F (36.4 C) (05/08 0548) Pulse Rate:  [81-88] 88 (05/08 0548) Resp:  [16] 16 (05/08 0548) BP: (100-117)/(60-63) 100/62 mmHg (05/08 0548) SpO2:  [96 %-98 %] 98 % (05/08 0548) Last BM Date: 11/03/14  Intake/Output from previous day: 05/07 0701 - 05/08 0700 In: 1020 [P.O.:720; IV Piggyback:300] Out: 200 [Urine:200] Intake/Output this shift: Total I/O In: 360 [P.O.:360] Out: -  General appearance: alert and cooperative GI: benign abd exam  Lab Results:   Recent Labs  11/02/14 0420 11/04/14 0417  WBC 22.0* 15.4*  HGB 10.3* 9.8*  HCT 32.0* 30.4*  PLT 99* 78*   BMET  Recent Labs  11/02/14 0420 11/04/14 0417  NA 133* 135  K 3.4* 3.3*  CL 103 102  CO2 23 27  GLUCOSE 138* 106*  BUN <5* 6  CREATININE 0.52 0.59  CALCIUM 8.1* 8.3*    Anti-infectives: Anti-infectives    Start     Dose/Rate Route Frequency Ordered Stop   11/01/14 1330  ciprofloxacin (CIPRO) IVPB 400 mg     400 mg 200 mL/hr over 60 Minutes Intravenous Every 12 hours 11/01/14 1241     11/01/14 1330  metroNIDAZOLE (FLAGYL) IVPB 500 mg     500 mg 100 mL/hr over 60 Minutes Intravenous Every 8 hours 11/01/14 1241     11/01/14 1215  ciprofloxacin (CIPRO) IVPB 400 mg  Status:  Discontinued     400 mg 200 mL/hr over 60 Minutes Intravenous Every 12 hours 11/01/14 1213 11/01/14 1240   11/01/14 1215  metroNIDAZOLE (FLAGYL) IVPB 500 mg  Status:  Discontinued     500 mg 100 mL/hr over 60 Minutes Intravenous Every 8 hours 11/01/14 1213 11/01/14 1241      Assessment/Plan: Diverticulitis with possible colovesical fistula Would cont IV abx for her diverticulitis for now.   No acute surgical plans Cont liquid diet and ensure for now to allow for colon to rest Will follow   LOS: 3 days    Anyah Swallow C. 08/29/9922

## 2014-11-04 NOTE — Progress Notes (Signed)
PROGRESS NOTE  Stefanie Henry YHC:[id number] DOB: Feb 26, 1930 DOA: 11/01/2014 PCP: [name], MD  HPI: 81 y.o. female has a past medical history significant for CLL, diverticulitis, Asthma, presents to the ED from home after bing instructed b GI to present to the ED as she has persistent abscess and colovesical fistula.   Subjective / 24 H Interval events - ongoing abdominal bloating - no chest pain/dyspnea/abdominal pain  Assessment/Plan: Active Problems:   Chronic lymphocytic leukemia   Dyspnea   Diverticulitis   Abscess, abdomen   Hyponatremia   Diverticulitis of large intestine with perforation and abscess without bleeding   Thrombocytopenia   Dysphagia   Malnutrition of moderate degree   Sigmoid diverticulitis with abscess - repeat CT as an outpatient showed new air in bladder ?colonovesical fistula - abscess is persistent, measuring 2.5 cm - general surgery consulted, recommending IV antibiotics, no need for intervention for now - gastroenterology following, discussed with them, recommending close monitoring with 5-7 days IV Abx prior to consider po transition - son told me that her oral antibiotics were making her nauseous at home and was barely able to tolerate them. I am not clear whether she skipped doses or not - continue antibiotics through the weekend  Hypokalemia - replete po  CLL - with associated leukocytosis - discussed with Dr. [name] over the phone 5/6, she was on long standing prednisone, will likely not need it anymore but he recommends a slow taper given prolonged expoure - continue prednisone at 5 mg daily    Dysphagia / early satiety - GI consulted as well, patient now reports weight loss - no planned interventions for now  Hyponatremia - stable, mild dehydration - improved with hydration  Thrombocytopenia - chronic, stable, trending down, will discontinue Lovenox today and switch to SCD  ?Asthma - no wheezing, resume  albuterol   Diet: Diet full liquid Room service appropriate?: Yes; Fluid consistency:: Thin Fluids: none DVT Prophylaxis: SCD  Code Status: Full Code Family Communication: d/w son over the phone  Disposition Plan: remain inpatient  Consultants:  GI  General surgery   Procedures:  None    Antibiotics Ciprofloxacin 5/5 >> Metronidazole 5/5 >>   Studies  No results found.  Objective  Filed Vitals:   11/03/14 0520 11/03/14 1445 11/03/14 2108 11/04/14 0548  BP: 118/59 117/63 108/60 100/62  Pulse: 97 81 88 88  Temp: 98.3 F (36.8 C) 98 F (36.7 C) 98.5 F (36.9 C) 97.6 F (36.4 C)  TempSrc: Oral Oral Oral Oral  Resp: 16 16 16 16   Height:      Weight:      SpO2: 97% 96% 98% 98%    Intake/Output Summary (Last 24 hours) at 11/04/14 0937 Last data filed at 11/04/14 0327  Gross per 24 hour  Intake    900 ml  Output      0 ml  Net    900 ml   Filed Weights   11/01/14 1237  Weight: 55.8 kg (123 lb 0.3 oz)    Exam:  General:  NAD  HEENT: no scleral icterus, PERRL  Cardiovascular: RRR without MRG, 2+ peripheral pulses, no edema  Respiratory: CTA biL, good air movement, no wheezing, no crackles, no rales  Abdomen: soft, non tender, BS +, no guarding  MSK/Extremities: no clubbing/cyanosis, no joint swelling  Skin: no rashes  Data Reviewed: Basic Metabolic Panel:  Recent Labs Lab 10/30/14 1455 11/01/14 1125 11/02/14 0420 11/04/14 0417  NA 129* 130* 133* 135  K 3.8  4.1 3.4* 3.3*  CL 95* 96* 103 102  CO2 29 25 23 27   GLUCOSE 99 113* 138* 106*  BUN 5* 6 <5* 6  CREATININE 0.63 0.49 0.52 0.59  CALCIUM 8.5 8.6* 8.1* 8.3*   Liver Function Tests:  Recent Labs Lab 10/30/14 1455 11/02/14 0420 11/04/14 0417  AST 13 16 16   ALT 13 13* 12*  ALKPHOS 76 54 51  BILITOT 0.5 0.4 0.4  PROT 5.6* 4.7* 4.6*  ALBUMIN 3.6 3.1* 3.0*   CBC:  Recent Labs Lab 10/30/14 1455 11/01/14 1125 11/02/14 0420 11/04/14 0417  WBC 29.6 Repeated and verified  X2.* 31.8* 22.0* 15.4*  NEUTROABS 3.4 2.5  --   --   HGB 11.3* 11.6* 10.3* 9.8*  HCT 35.4* 36.6 32.0* 30.4*  MCV 71.3* 71.2* 71.3* 71.4*  PLT 123.0* 100* 99* 78*   ProBNP (last 3 results)  Recent Labs  10/05/14 1142  PROBNP 81.0   Scheduled Meds: . ciprofloxacin  400 mg Intravenous Q12H  . enoxaparin (LOVENOX) injection  40 mg Subcutaneous Q24H  . feeding supplement (ENSURE ENLIVE)  237 mL Oral BID BM  . feeding supplement (ENSURE)  1 Container Oral TID BM  . gabapentin  100 mg Oral Daily  . metronidazole  500 mg Intravenous Q8H  . pantoprazole  40 mg Oral Daily  . predniSONE  5 mg Oral Q breakfast  . saccharomyces boulardii  250 mg Oral BID   Time spent: 15 minutes  Continuous Infusions:   Marzetta Board, MD [facility] Pager [phone]. If 7 PM - 7 AM, please contact night-coverage at [email or web address], password [facility] 11/04/2014, 9:37 AM  LOS: 3 days

## 2014-11-05 DIAGNOSIS — K5720 Diverticulitis of large intestine with perforation and abscess without bleeding: Secondary | ICD-10-CM | POA: Insufficient documentation

## 2014-11-05 MED ORDER — SODIUM CHLORIDE 0.9 % IV SOLN
INTRAVENOUS | Status: DC
  Administered 2014-11-10 – 2014-11-11 (×2): via INTRAVENOUS

## 2014-11-05 MED ORDER — METRONIDAZOLE IN NACL 5-0.79 MG/ML-% IV SOLN
500.0000 mg | Freq: Three times a day (TID) | INTRAVENOUS | Status: DC
  Administered 2014-11-05 – 2014-11-09 (×12): 500 mg via INTRAVENOUS
  Filled 2014-11-05 (×13): qty 100

## 2014-11-05 NOTE — Progress Notes (Signed)
  Subjective: Abdomen is soft, non tender,  She complains of not being able to eat, but this precedes her diverticulitis.  It is not new.  I will try her on a soft diet.  Objective: Vital signs in last 24 hours: Temp:  [97.7 F (36.5 C)-98.4 F (36.9 C)] 97.7 F (36.5 C) (05/09 0457) Pulse Rate:  [73-87] 87 (05/09 0457) Resp:  [16] 16 (05/09 0457) BP: (108-134)/(56-75) 134/75 mmHg (05/09 0457) SpO2:  [96 %-100 %] 100 % (05/09 0457) Last BM Date: 11/03/14 960 PO 1 stool Full liquid diet Afebrile,  VSS K+ 3.3 WBC down to 15.4 Platelets 78K  Intake/Output from previous day: 05/08 0701 - 05/09 0700 In: 960 [P.O.:960] Out: -  Intake/Output this shift: Total I/O In: 100 [IV Piggyback:100] Out: -   General appearance: alert, cooperative and no distress GI: soft, non-tender; bowel sounds normal; no masses,  no organomegaly  Lab Results:   Recent Labs  11/04/14 0417  WBC 15.4*  HGB 9.8*  HCT 30.4*  PLT 78*    BMET  Recent Labs  11/04/14 0417  NA 135  K 3.3*  CL 102  CO2 27  GLUCOSE 106*  BUN 6  CREATININE 0.59  CALCIUM 8.3*   PT/INR No results for input(s): LABPROT, INR in the last 72 hours.   Recent Labs Lab 10/30/14 1455 11/02/14 0420 11/04/14 0417  AST 13 16 16   ALT 13 13* 12*  ALKPHOS 76 54 51  BILITOT 0.5 0.4 0.4  PROT 5.6* 4.7* 4.6*  ALBUMIN 3.6 3.1* 3.0*     Lipase     Component Value Date/Time   LIPASE 18 10/19/2014 0415     Studies/Results: No results found.  Medications: . ciprofloxacin  400 mg Intravenous Q12H  . feeding supplement (ENSURE ENLIVE)  237 mL Oral BID BM  . feeding supplement (ENSURE)  1 Container Oral TID BM  . gabapentin  100 mg Oral Daily  . metronidazole  500 mg Intravenous Q8H  . pantoprazole  40 mg Oral Daily  . predniSONE  5 mg Oral Q breakfast  . saccharomyces boulardii  250 mg Oral BID    Assessment/Plan Diverticulitis with possible colovesical fistula CLL Thrombocytopenia Dysphagia/early  satiety Day 5 Cipro/Flagyl DVT:  Low platelets, nothing currently ordered.   Plan:  I will put her into the soft diet, multiple meals, and see how she does.  Her son says she came in because she could not tolerate PO antibiotics.  No surgical plans at this time     LOS: 4 days    Stefanie Henry 11/05/2014

## 2014-11-05 NOTE — Progress Notes (Signed)
PROGRESS NOTE  Stefanie Henry OHY:[id number] DOB: Sep 15, 1929 DOA: 11/01/2014 PCP: [name], MD  HPI: 81 y.o. female has a past medical history significant for CLL, diverticulitis, Asthma, presents to the ED from home after bing instructed b GI to present to the ED as she has persistent abscess and colovesical fistula.   Subjective / 24 H Interval events - ongoing abdominal bloating - no chest pain/dyspnea/abdominal pain  Assessment/Plan: Active Problems:   Chronic lymphocytic leukemia   Dyspnea   Diverticulitis   Abscess, abdomen   Hyponatremia   Diverticulitis of large intestine with perforation and abscess without bleeding   Thrombocytopenia   Dysphagia   Malnutrition of moderate degree   Sigmoid diverticulitis with abscess - repeat CT as an outpatient showed new air in bladder ?colonovesical fistula - abscess is persistent, measuring 2.5 cm - general surgery consulted, recommending IV antibiotics, no need for intervention for now - gastroenterology following, recommending close monitoring with 5-7 days IV Abx prior to consider po transition - son told me that her oral antibiotics were making her nauseous at home and was barely able to tolerate them. I am not clear whether she skipped doses or not - continue antibiotics for now  Dysphagia / early satiety - GI consulted as well, patient now reports weight loss - discussed with GI today as this may potentially have contributed to patient not being able to tolerate po antibiotics  Hypokalemia - replete po  CLL - with associated leukocytosis - discussed with Dr. Marin Henry over the phone 5/6, she was on long standing prednisone, will likely not need it anymore but he recommends a slow taper given prolonged expoure - continue prednisone at 5 mg daily    Hyponatremia - stable, mild dehydration - improved with hydration  Thrombocytopenia - chronic, stable, trending down, discontinued Lovenox 5/8 and transitioned to  SCD  ?Asthma - no wheezing, resume albuterol   Diet: Diet full liquid Room service appropriate?: Yes; Fluid consistency:: Thin Fluids: none DVT Prophylaxis: SCD  Code Status: Full Code Family Communication: d/w son over the phone  Disposition Plan: remain inpatient  Consultants:  GI  General surgery   Procedures:  None    Antibiotics Ciprofloxacin 5/5 >> Metronidazole 5/5 >>   Studies  No results found.  Objective  Filed Vitals:   11/04/14 0548 11/04/14 1313 11/04/14 2110 11/05/14 0457  BP: 100/62 108/56 119/61 134/75  Pulse: 88 75 73 87  Temp: 97.6 F (36.4 C) 98.4 F (36.9 C) 98.1 F (36.7 C) 97.7 F (36.5 C)  TempSrc: Oral Oral Oral Oral  Resp: 16 16 16 16   Height:      Weight:      SpO2: 98% 96% 98% 100%    Intake/Output Summary (Last 24 hours) at 11/05/14 1130 Last data filed at 11/05/14 0800  Gross per 24 hour  Intake    700 ml  Output      0 ml  Net    700 ml   Filed Weights   11/01/14 1237  Weight: 55.8 kg (123 lb 0.3 oz)    Exam:  General:  NAD  HEENT: no scleral icterus, PERRL  Cardiovascular: RRR without MRG, 2+ peripheral pulses, no edema  Respiratory: CTA biL, good air movement, no wheezing, no crackles, no rales  Abdomen: soft, non tender, BS +, no guarding  MSK/Extremities: no clubbing/cyanosis, no joint swelling  Skin: no rashes  Data Reviewed: Basic Metabolic Panel:  Recent Labs Lab 10/30/14 1455 11/01/14 1125 11/02/14 0420 11/04/14 0417  NA 129* 130* 133* 135  K 3.8 4.1 3.4* 3.3*  CL 95* 96* 103 102  CO2 29 25 23 27   GLUCOSE 99 113* 138* 106*  BUN 5* 6 <5* 6  CREATININE 0.63 0.49 0.52 0.59  CALCIUM 8.5 8.6* 8.1* 8.3*   Liver Function Tests:  Recent Labs Lab 10/30/14 1455 11/02/14 0420 11/04/14 0417  AST 13 16 16   ALT 13 13* 12*  ALKPHOS 76 54 51  BILITOT 0.5 0.4 0.4  PROT 5.6* 4.7* 4.6*  ALBUMIN 3.6 3.1* 3.0*   CBC:  Recent Labs Lab 10/30/14 1455 11/01/14 1125 11/02/14 0420  11/04/14 0417  WBC 29.6 Repeated and verified X2.* 31.8* 22.0* 15.4*  NEUTROABS 3.4 2.5  --   --   HGB 11.3* 11.6* 10.3* 9.8*  HCT 35.4* 36.6 32.0* 30.4*  MCV 71.3* 71.2* 71.3* 71.4*  PLT 123.0* 100* 99* 78*   ProBNP (last 3 results)  Recent Labs  10/05/14 1142  PROBNP 81.0   Scheduled Meds: . ciprofloxacin  400 mg Intravenous Q12H  . feeding supplement (ENSURE ENLIVE)  237 mL Oral BID BM  . feeding supplement (ENSURE)  1 Container Oral TID BM  . gabapentin  100 mg Oral Daily  . metronidazole  500 mg Intravenous Q8H  . pantoprazole  40 mg Oral Daily  . predniSONE  5 mg Oral Q breakfast  . saccharomyces boulardii  250 mg Oral BID   Time spent: 25 minutes  Continuous Infusions: . sodium chloride 10 mL/hr at 11/05/14 0800    Marzetta Board, MD [facility] Pager [phone]. If 7 PM - 7 AM, please contact night-coverage at [email or web address], password [facility] 11/05/2014, 11:30 AM  LOS: 4 days

## 2014-11-05 NOTE — Progress Notes (Signed)
RN paged MD about calling and speaking with patients son who is now at bedside.

## 2014-11-05 NOTE — Progress Notes (Signed)
    Progress Note   Subjective  No abdominal pain. No nausea. Has immediate bloating with PO intake   Objective   Vital signs in last 24 hours: Temp:  [97.7 F (36.5 C)-98.4 F (36.9 C)] 97.7 F (36.5 C) (05/09 0457) Pulse Rate:  [73-87] 87 (05/09 0457) Resp:  [16] 16 (05/09 0457) BP: (108-134)/(56-75) 134/75 mmHg (05/09 0457) SpO2:  [96 %-100 %] 100 % (05/09 0457) Last BM Date: 11/03/14 General:    Pleasant female in NAD Heart:  Regular rate and rhythm Lungs: Respirations even and unlabored, lungs CTA bilaterally Abdomen:  Soft, nontender, active bowel sounds. Neurologic:  Alert and oriented,  grossly normal neurologically. Psych:  Cooperative. Normal mood and affect.  Lab Results:  Recent Labs  11/04/14 0417  WBC 15.4*  HGB 9.8*  HCT 30.4*  PLT 78*   BMET  Recent Labs  11/04/14 0417  NA 135  K 3.3*  CL 102  CO2 27  GLUCOSE 106*  BUN 6  CREATININE 0.59  CALCIUM 8.3*   LFT  Recent Labs  11/04/14 0417  PROT 4.6*  ALBUMIN 3.0*  AST 16  ALT 12*  ALKPHOS 51  BILITOT 0.4     Assessment / Plan:   57. 81 year old female with diverticulitis complicated by abscess and possibly colovesical fistula. Surgery following and plan is to continue IV antibiotics (on day 5) and liquid diet for now. She will need eventual colonoscopy (never had one) .   2. Cholelithiasis. She has upper abdominal complaints (excessive bloating / early satiety) but no acute cholecystitis on CTscans. LFTs recently increased but now back to normal . MRCP negative for cholelithiasis. Son Maurene Capes called, he thought we were planning EGD for evaluation of upper symptoms. I explained that we may eventually do that but right now the focus is on treatment of complicated diverticulitis. He wants to be kept up to date. His number is [phone]      LOS: 4 days   Tye Savoy  11/05/2014, 9:21 AM

## 2014-11-06 DIAGNOSIS — R6881 Early satiety: Secondary | ICD-10-CM

## 2014-11-06 DIAGNOSIS — D509 Iron deficiency anemia, unspecified: Secondary | ICD-10-CM

## 2014-11-06 LAB — CBC
HEMATOCRIT: 31.3 % — AB (ref 36.0–46.0)
Hemoglobin: 10.1 g/dL — ABNORMAL LOW (ref 12.0–15.0)
MCH: 23 pg — ABNORMAL LOW (ref 26.0–34.0)
MCHC: 32.3 g/dL (ref 30.0–36.0)
MCV: 71.1 fL — AB (ref 78.0–100.0)
Platelets: 70 10*3/uL — ABNORMAL LOW (ref 150–400)
RBC: 4.4 MIL/uL (ref 3.87–5.11)
RDW: 14.2 % (ref 11.5–15.5)
WBC: 20.4 10*3/uL — AB (ref 4.0–10.5)

## 2014-11-06 LAB — COMPREHENSIVE METABOLIC PANEL
ALT: 10 U/L — AB (ref 14–54)
AST: 15 U/L (ref 15–41)
Albumin: 3.2 g/dL — ABNORMAL LOW (ref 3.5–5.0)
Alkaline Phosphatase: 52 U/L (ref 38–126)
Anion gap: 4 — ABNORMAL LOW (ref 5–15)
BUN: <5 mg/dL — ABNORMAL LOW (ref 6–20)
CO2: 27 mmol/L (ref 22–32)
Calcium: 8.3 mg/dL — ABNORMAL LOW (ref 8.9–10.3)
Chloride: 102 mmol/L (ref 101–111)
Creatinine, Ser: 0.5 mg/dL (ref 0.44–1.00)
GLUCOSE: 122 mg/dL — AB (ref 70–99)
Potassium: 3.4 mmol/L — ABNORMAL LOW (ref 3.5–5.1)
SODIUM: 133 mmol/L — AB (ref 135–145)
TOTAL PROTEIN: 4.9 g/dL — AB (ref 6.5–8.1)
Total Bilirubin: 0.5 mg/dL (ref 0.3–1.2)

## 2014-11-06 NOTE — Progress Notes (Signed)
Patient ID: Stefanie Henry, female   DOB: 1929-07-27, 81 y.o.   MRN: [id number]  [facility] PROGRESS NOTE  Stefanie Henry [id number]:[id number] DOB: Apr 05, 1930 DOA: 11/01/2014 PCP: [name], MD   Brief narrative:    81 y.o. female with CLL, diverticulitis, asthma, presented to the ED from home after being instructed by GI to present to the ED as she has persistent abscess and colovesical fistula.   Assessment/Plan:    Sigmoid diverticulitis with abscess - repeat CT as an outpatient showed new air in bladder ?colonovesical fistula - abscess is persistent, measuring 2.5 cm - general surgery consulted, recommending IV antibiotics, no need for intervention - plan for EGD in AM per GI team  - continue ABX   Dysphagia / early satiety - tolerating diet well but still with poor oral intake - plan for EGD in AM  Hypokalemia - supplement and repeat BMP in AM  CLL - with associated leukocytosis - Dr. Renne Crigler discussed with Dr. [name], pt was on long standing prednisone, will likely not need it anymore but he recommends a slow taper given prolonged expoure - continue prednisone at 5 mg daily   Hyponatremia - from dehydration - monitor with BMP   Thrombocytopenia - discontinued Lovenox 5/8 and transitioned to SCD due to persistent drop in Plt  - no signs of active bleeding - will repeat CBC in AM  ?Asthma - no wheezing, resumed albuterol  DVT prophylaxis - SCD's  Code Status: Full.  Family Communication:  plan of care discussed with the patient Disposition Plan: Barrier to discharge - needs EGD in AM  IV access:  Peripheral IV  Procedures and diagnostic studies:    Ct Abdomen Pelvis Wo Contrast  10/31/2014 Residual changes of sigmoid diverticulitis and associated abscess. New air in the bladder raises suspicion for a colonovesical fistula, in the absence of recent bladder catheterization.    Ct Abdomen Pelvis Wo Contrast  10/15/2014  Colonic diverticulosis with evidence of  mild inflammatory changes about the sigmoid colon most likely reflecting diverticulitis with a likely 2.4 cm contained perforation/small abscess between the sigmoid colon and bladder.    Dg Chest 1 View  10/16/2014  Mildly increased central pulmonary vascular congestion is noted with possible minimal bilateral perihilar edema.     Mr 3d Recon At Scanner  10/19/2014 Cholelithiasis without evidence of acute cholecystitis, and without evidence of choledocholithiasis or biliary tract obstruction. 2. Although the common bile duct is mildly dilated 8 mm in the porta hepatis, this is within normal limits for a patient this age, and there is no associated intrahepatic biliary ductal dilatation. 3. Extensive iron deposition throughout the liver and spleen, as above. 4. Additional incidental findings, as above.     Mr Abd W/wo Cm/mrcp  10/19/2014  Cholelithiasis without evidence of acute cholecystitis, and without evidence of choledocholithiasis or biliary tract obstruction. 2. Although the common bile duct is mildly dilated 8 mm in the porta hepatis, this is within normal limits for a patient this age, and there is no associated intrahepatic biliary ductal dilatation. 3. Extensive iron deposition throughout the liver and spleen, as above. 4. Additional incidental findings, as above.     Medical Consultants:  GI  Surgery   Other Consultants:  None  IAnti-Infectives:   Cipro 5/5 --> Flagyl 5/5 -->   Faye Ramsay, MD  [facility] Pager [phone]  If 7PM-7AM, please contact night-coverage [email or web address] Password [email or web address] 11/06/2014, 7:11 PM   LOS: 5 days   HPI/Subjective: No events overnight.  Objective: Filed Vitals:   11/05/14 1948 11/05/14 2018 11/06/14 0448 11/06/14 1337  BP:  120/61 143/66 127/62  Pulse: 74 79 92 96  Temp:  98.1 F (36.7 C) 98 F (36.7 C) 98.1 F (36.7 C)  TempSrc:  Oral Oral Oral  Resp:  16 16 14   Height:      Weight:      SpO2:  99% 97% 98%    Intake/Output Summary  (Last 24 hours) at 11/06/14 1911 Last data filed at 11/06/14 1338  Gross per 24 hour  Intake 1008.67 ml  Output   1300 ml  Net -291.33 ml    Exam:   General:  Pt is alert, follows commands appropriately, not in acute distress  Cardiovascular: Regular rate and rhythm, no rubs, no gallops  Respiratory: Clear to auscultation bilaterally, no wheezing, no crackles, no rhonchi  Abdomen: Soft, non tender, non distended, bowel sounds present, no guarding  Extremities:  pulses DP and PT palpable bilaterally  Neuro: Grossly nonfocal  Data Reviewed: Basic Metabolic Panel:  Recent Labs Lab 11/01/14 1125 11/02/14 0420 11/04/14 0417 11/06/14 0413  NA 130* 133* 135 133*  K 4.1 3.4* 3.3* 3.4*  CL 96* 103 102 102  CO2 25 23 27 27   GLUCOSE 113* 138* 106* 122*  BUN 6 <5* 6 <5*  CREATININE 0.49 0.52 0.59 0.50  CALCIUM 8.6* 8.1* 8.3* 8.3*   Liver Function Tests:  Recent Labs Lab 11/02/14 0420 11/04/14 0417 11/06/14 0413  AST 16 16 15   ALT 13* 12* 10*  ALKPHOS 54 51 52  BILITOT 0.4 0.4 0.5  PROT 4.7* 4.6* 4.9*  ALBUMIN 3.1* 3.0* 3.2*   CBC:  Recent Labs Lab 11/01/14 1125 11/02/14 0420 11/04/14 0417 11/06/14 0413  WBC 31.8* 22.0* 15.4* 20.4*  NEUTROABS 2.5  --   --   --   HGB 11.6* 10.3* 9.8* 10.1*  HCT 36.6 32.0* 30.4* 31.3*  MCV 71.2* 71.3* 71.4* 71.1*  PLT 100* 99* 78* 70*   Recent Results (from the past 240 hour(s))  Urine culture     Status: None   Collection Time: 11/01/14 10:53 AM  Result Value Ref Range Status   Specimen Description URINE, RANDOM  Final   Special Requests NONE  Final   Colony Count   Final    2,000 COLONIES/ML Performed at [facility]    Culture   Final    INSIGNIFICANT GROWTH Performed at [facility]    Report Status 11/02/2014 FINAL  Final     Scheduled Meds: . ciprofloxacin  400 mg Intravenous Q12H  . feeding supplement (ENSURE ENLIVE)  237 mL Oral BID BM  . feeding supplement (ENSURE)  1 Container Oral  TID BM  . gabapentin  100 mg Oral Daily  . metronidazole  500 mg Intravenous Q8H  . pantoprazole  40 mg Oral Daily  . predniSONE  5 mg Oral Q breakfast  . saccharomyces boulardii  250 mg Oral BID   Continuous Infusions: . sodium chloride 10 mL/hr at 11/05/14 1948

## 2014-11-06 NOTE — Progress Notes (Signed)
    Progress Note   Subjective  could only tolerate a few bites of breakfast. No pain or nausea just get full and has gas / bloating   Objective   Vital signs in last 24 hours: Temp:  [98 F (36.7 C)-98.4 F (36.9 C)] 98 F (36.7 C) (05/10 0448) Pulse Rate:  [74-92] 92 (05/10 0448) Resp:  [16] 16 (05/10 0448) BP: (120-143)/(61-67) 143/66 mmHg (05/10 0448) SpO2:  [97 %-99 %] 97 % (05/10 0448) Last BM Date: 11/05/14 (x2) General:    pleasant female in NAD Abdomen:  Soft, nontender and nondistended. Normal bowel sounds. Neurologic:  Alert and oriented,  grossly normal neurologically. Psych:  Cooperative. Normal mood and affect.    Lab Results:  Recent Labs  11/04/14 0417 11/06/14 0413  WBC 15.4* 20.4*  HGB 9.8* 10.1*  HCT 30.4* 31.3*  PLT 78* 70*   BMET  Recent Labs  11/04/14 0417 11/06/14 0413  NA 135 133*  K 3.3* 3.4*  CL 102 102  CO2 27 27  GLUCOSE 106* 122*  BUN 6 <5*  CREATININE 0.59 0.50  CALCIUM 8.3* 8.3*   LFT  Recent Labs  11/06/14 0413  PROT 4.9*  ALBUMIN 3.2*  AST 15  ALT 10*  ALKPHOS 52  BILITOT 0.5     Assessment / Plan:   66. 82 year old female with diverticulitis complicated by abscess and possibly colovesical fistula. Surgery following and plan is to continue IV antibiotics (on day 6) and advance to soft diet today. She will need eventual colonoscopy (never had one) .   2. Cholelithiasis. She has upper abdominal complaints (excessive bloating / early satiety) but no acute cholecystitis on CTscans. MRCP negative for cholelithiasis.  Taking very little PO because of the upper complaints. She may need EGD this admission.    LOS: 5 days   Tye Savoy  11/06/2014, 9:48 AM   [facility] Attending  I have also seen and assessed the patient and agree with the advanced practitioner's assessment and plan.  Not sure why she has early satiety. Apparent mild weight loss also. Fe defic anemia recently treated w/ Feraheme.  An EGD is  appropriate to investigate. The risks and benefits as well as alternatives of endoscopic procedure(s) have been discussed and reviewed. All questions answered. The patient agrees to proceed. I also spoke to daughter-in-law. Plan for tomorrow at 1030.  Gatha Mayer, MD, [facility] [phone] (pager) 11/06/2014 1:40 PM

## 2014-11-06 NOTE — Progress Notes (Signed)
  Subjective: No complaints with me today.  Abdomen is soft and she seems comfortable.  PO intake still an issue and precedes her diverticulitis  Objective: Vital signs in last 24 hours: Temp:  [98 F (36.7 C)-98.4 F (36.9 C)] 98 F (36.7 C) (05/10 0448) Pulse Rate:  [74-92] 92 (05/10 0448) Resp:  [16] 16 (05/10 0448) BP: (120-143)/(61-67) 143/66 mmHg (05/10 0448) SpO2:  [97 %-99 %] 97 % (05/10 0448) Last BM Date: 11/05/14 (x2) 570 Po recorded yesterday Soft diet, but not taking much in. BM x 3 yesterday Labs stable    Intake/Output from previous day: 05/09 0701 - 05/10 0700 In: 1477.7 [P.O.:570; I.V.:207.7; IV Piggyback:700] Out: 1300 [Urine:1300] Intake/Output this shift: Total I/O In: 240 [P.O.:240] Out: -   General appearance: alert, cooperative and no distress GI: soft, non-tender; bowel sounds normal; no masses,  no organomegaly  Lab Results:   Recent Labs  11/04/14 0417 11/06/14 0413  WBC 15.4* 20.4*  HGB 9.8* 10.1*  HCT 30.4* 31.3*  PLT 78* 70*    BMET  Recent Labs  11/04/14 0417 11/06/14 0413  NA 135 133*  K 3.3* 3.4*  CL 102 102  CO2 27 27  GLUCOSE 106* 122*  BUN 6 <5*  CREATININE 0.59 0.50  CALCIUM 8.3* 8.3*   PT/INR No results for input(s): LABPROT, INR in the last 72 hours.   Recent Labs Lab 10/30/14 1455 11/02/14 0420 11/04/14 0417 11/06/14 0413  AST 13 16 16 15   ALT 13 13* 12* 10*  ALKPHOS 76 54 51 52  BILITOT 0.5 0.4 0.4 0.5  PROT 5.6* 4.7* 4.6* 4.9*  ALBUMIN 3.6 3.1* 3.0* 3.2*     Lipase     Component Value Date/Time   LIPASE 18 10/19/2014 0415     Studies/Results: No results found.  Medications: . ciprofloxacin  400 mg Intravenous Q12H  . feeding supplement (ENSURE ENLIVE)  237 mL Oral BID BM  . feeding supplement (ENSURE)  1 Container Oral TID BM  . gabapentin  100 mg Oral Daily  . metronidazole  500 mg Intravenous Q8H  . pantoprazole  40 mg Oral Daily  . predniSONE  5 mg Oral Q breakfast  .  saccharomyces boulardii  250 mg Oral BID    Assessment/Plan Diverticulitis with possible colovesical fistula CLL Thrombocytopenia Dysphagia/early satiety Day 6 Cipro/Flagyl IV form DVT: Low platelets, nothing currently ordered.   Plan:  No surgery planned at this time we can see as OP after she completes antibiotics and colonoscopy.  Please call if we can help.  Dr. Greer Pickerel saw her originally with our group and he can be her follow up surgeon as needed.    LOS: 5 days    Finnegan Gatta 11/06/2014

## 2014-11-07 ENCOUNTER — Encounter (HOSPITAL_COMMUNITY)
Admission: EM | Disposition: A | Discharge status: Home Health | Payer: Self-pay | Source: Home / Self Care | Attending: Internal Medicine

## 2014-11-07 ENCOUNTER — Encounter (HOSPITAL_COMMUNITY): Payer: Self-pay | Admitting: Gastroenterology

## 2014-11-07 DIAGNOSIS — K317 Polyp of stomach and duodenum: Secondary | ICD-10-CM

## 2014-11-07 DIAGNOSIS — R6881 Early satiety: Secondary | ICD-10-CM | POA: Insufficient documentation

## 2014-11-07 HISTORY — PX: ESOPHAGOGASTRODUODENOSCOPY: SHX5428

## 2014-11-07 LAB — CBC
HCT: 30.7 % — ABNORMAL LOW (ref 36.0–46.0)
HEMOGLOBIN: 10 g/dL — AB (ref 12.0–15.0)
MCH: 23.2 pg — AB (ref 26.0–34.0)
MCHC: 32.6 g/dL (ref 30.0–36.0)
MCV: 71.2 fL — AB (ref 78.0–100.0)
Platelets: 70 10*3/uL — ABNORMAL LOW (ref 150–400)
RBC: 4.31 MIL/uL (ref 3.87–5.11)
RDW: 14 % (ref 11.5–15.5)
WBC: 20.8 10*3/uL — ABNORMAL HIGH (ref 4.0–10.5)

## 2014-11-07 LAB — BASIC METABOLIC PANEL
Anion gap: 9 (ref 5–15)
BUN: <5 mg/dL — ABNORMAL LOW (ref 6–20)
CO2: 27 mmol/L (ref 22–32)
Calcium: 8.4 mg/dL — ABNORMAL LOW (ref 8.9–10.3)
Chloride: 100 mmol/L — ABNORMAL LOW (ref 101–111)
Creatinine, Ser: 0.55 mg/dL (ref 0.44–1.00)
GFR calc Af Amer: >60 mL/min (ref >60)
Glucose, Bld: 106 mg/dL — ABNORMAL HIGH (ref 70–99)
POTASSIUM: 3.3 mmol/L — AB (ref 3.5–5.1)
SODIUM: 136 mmol/L (ref 135–145)

## 2014-11-07 SURGERY — EGD (ESOPHAGOGASTRODUODENOSCOPY)
Anesthesia: Moderate Sedation

## 2014-11-07 MED ORDER — MIDAZOLAM HCL 10 MG/2ML IJ SOLN
INTRAMUSCULAR | Status: DC | PRN
  Administered 2014-11-07: 1 mg via INTRAVENOUS
  Administered 2014-11-07: 2 mg via INTRAVENOUS

## 2014-11-07 MED ORDER — GI COCKTAIL ~~LOC~~
30.0000 mL | Freq: Once | ORAL | Status: AC
  Administered 2014-11-07: 30 mL via ORAL
  Filled 2014-11-07 (×2): qty 30

## 2014-11-07 MED ORDER — MIDAZOLAM HCL 10 MG/2ML IJ SOLN
INTRAMUSCULAR | Status: AC
  Filled 2014-11-07: qty 2

## 2014-11-07 MED ORDER — FENTANYL CITRATE (PF) 100 MCG/2ML IJ SOLN
INTRAMUSCULAR | Status: DC | PRN
  Administered 2014-11-07: 25 ug via INTRAVENOUS

## 2014-11-07 MED ORDER — POTASSIUM CHLORIDE CRYS ER 20 MEQ PO TBCR
40.0000 meq | EXTENDED_RELEASE_TABLET | Freq: Once | ORAL | Status: AC
  Administered 2014-11-07: 40 meq via ORAL

## 2014-11-07 MED ORDER — FENTANYL CITRATE (PF) 100 MCG/2ML IJ SOLN
INTRAMUSCULAR | Status: AC
  Filled 2014-11-07: qty 2

## 2014-11-07 NOTE — Op Note (Signed)
[facility] [place], [address]   ENDOSCOPY PROCEDURE REPORT  PATIENT: Stefanie Henry, Stefanie Henry  MR#: [id number] BIRTHDATE: 03/16/1930 , 85  yrs. old GENDER: female ENDOSCOPIST: Gatha Mayer, MD, [facility] PROCEDURE DATE:  11/07/2014 PROCEDURE:  EGD, diagnostic ASA CLASS:     Class III INDICATIONS:  early satiety. MEDICATIONS: Fentanyl 25 mcg IV and Versed 3 mg IV TOPICAL ANESTHETIC: none  DESCRIPTION OF PROCEDURE: After the risks benefits and alternatives of the procedure were thoroughly explained, informed consent was obtained.  The    endoscope was introduced through the mouth and advanced to the second portion of the duodenum , Without limitations.  The instrument was slowly withdrawn as the mucosa was fully examined.    1) Nunmerous diminutive flat white polyps in gastric body, cardia and fundus.  Biopsies taken. 2) Otherwise normal egd.  Retroflexed views revealed as previously described.     The scope was then withdrawn from the patient and the procedure completed.  COMPLICATIONS: There were no immediate complications.  ENDOSCOPIC IMPRESSION: 1) Nunmerous diminutive flat white polyps in gastric body, cardia and fundus.  Biopsies taken. 2) Otherwise normal egd  RECOMMENDATIONS: Await pathology results     sxs sound functional though    eSigned:  Gatha Mayer, MD, [facility] 11/07/2014 10:37 AM

## 2014-11-07 NOTE — Progress Notes (Addendum)
Patient ID: Stefanie Henry, female   DOB: 13-May-1930, 81 y.o.   MRN: [id number]  [facility] PROGRESS NOTE  Stefanie Henry MHD:[id number] DOB: 07-27-1929 DOA: 11/01/2014 PCP: [name], MD   Brief narrative:    81 y.o. female with CLL, diverticulitis, asthma, presented to the ED from home after being instructed by GI to present to the ED as she has persistent abscess and colovesical fistula.   Assessment/Plan:    Sigmoid diverticulitis with abscess - repeat CT as an outpatient showed new air in bladder ?colonovesical fistula - abscess is persistent, measuring 2.5 cm - general surgery consulted, recommending IV antibiotics, no need for intervention - plan for EGD today - continue ABX IV  Dysphagia / early satiety - tolerating diet well but still with poor oral intake - plan for EGD today as noted above   Hypokalemia - still low, will supplement and repeat BMP in AM  CLL - with associated leukocytosis - Dr. Renne Crigler discussed with Dr. [name], pt was on long standing prednisone, will likely not need it anymore but he recommends a slow taper given prolonged expoure - continue prednisone at 5 mg daily   Hyponatremia - from dehydration - now WNL  Thrombocytopenia - discontinued Lovenox 5/8 and transitioned to SCD due to persistent drop in Plt  - no signs of active bleeding - will repeat CBC in AM  ?Asthma - no wheezing, resumed albuterol  DVT prophylaxis - SCD's  Code Status: Full.  Family Communication:  plan of care discussed with the patient Disposition Plan: Barrier to discharge - needs EGD today, d/c plan pending results  IV access:  Peripheral IV  Procedures and diagnostic studies:    Ct Abdomen Pelvis Wo Contrast  10/31/2014 Residual changes of sigmoid diverticulitis and associated abscess. New air in the bladder raises suspicion for a colonovesical fistula, in the absence of recent bladder catheterization.    Ct Abdomen Pelvis Wo Contrast  10/15/2014   Colonic diverticulosis with evidence of mild inflammatory changes about the sigmoid colon most likely reflecting diverticulitis with a likely 2.4 cm contained perforation/small abscess between the sigmoid colon and bladder.    Dg Chest 1 View  10/16/2014  Mildly increased central pulmonary vascular congestion is noted with possible minimal bilateral perihilar edema.     Mr 3d Recon At Scanner  10/19/2014 Cholelithiasis without evidence of acute cholecystitis, and without evidence of choledocholithiasis or biliary tract obstruction. 2. Although the common bile duct is mildly dilated 8 mm in the porta hepatis, this is within normal limits for a patient this age, and there is no associated intrahepatic biliary ductal dilatation. 3. Extensive iron deposition throughout the liver and spleen, as above. 4. Additional incidental findings, as above.     Mr Abd W/wo Cm/mrcp  10/19/2014  Cholelithiasis without evidence of acute cholecystitis, and without evidence of choledocholithiasis or biliary tract obstruction. 2. Although the common bile duct is mildly dilated 8 mm in the porta hepatis, this is within normal limits for a patient this age, and there is no associated intrahepatic biliary ductal dilatation. 3. Extensive iron deposition throughout the liver and spleen, as above. 4. Additional incidental findings, as above.     Medical Consultants:  GI  Surgery   Other Consultants:  None  IAnti-Infectives:   Cipro 5/5 --> Flagyl 5/5 -->   Faye Ramsay, MD  [facility] Pager [phone]  If 7PM-7AM, please contact night-coverage [email or web address] Password [facility] 11/07/2014, 6:47 AM   LOS: 6 days   HPI/Subjective: No events overnight.  Objective: Filed Vitals:   11/06/14 0448 11/06/14 1337 11/06/14 2128 11/07/14 0559  BP: 143/66 127/62 119/66 115/60  Pulse: 92 96 85 72  Temp: 98 F (36.7 C) 98.1 F (36.7 C) 98.2 F (36.8 C) 97.9 F (36.6 C)  TempSrc: Oral Oral Oral Oral  Resp: 16 14 16 16   Height:       Weight:      SpO2: 97% 98% 100% 98%    Intake/Output Summary (Last 24 hours) at 11/07/14 0647 Last data filed at 11/07/14 0600  Gross per 24 hour  Intake 714.83 ml  Output      0 ml  Net 714.83 ml    Exam:   General:  Pt is alert, follows commands appropriately, not in acute distress  Cardiovascular: Regular rate and rhythm, no rubs, no gallops  Respiratory: Clear to auscultation bilaterally, no wheezing, no crackles, no rhonchi  Abdomen: Soft, non tender, non distended, bowel sounds present, no guarding  Extremities:  pulses DP and PT palpable bilaterally  Neuro: Grossly nonfocal  Data Reviewed: Basic Metabolic Panel:  Recent Labs Lab 11/01/14 1125 11/02/14 0420 11/04/14 0417 11/06/14 0413 11/07/14 0410  NA 130* 133* 135 133* 136  K 4.1 3.4* 3.3* 3.4* 3.3*  CL 96* 103 102 102 100*  CO2 25 23 27 27 27   GLUCOSE 113* 138* 106* 122* 106*  BUN 6 <5* 6 <5* <5*  CREATININE 0.49 0.52 0.59 0.50 0.55  CALCIUM 8.6* 8.1* 8.3* 8.3* 8.4*   Liver Function Tests:  Recent Labs Lab 11/02/14 0420 11/04/14 0417 11/06/14 0413  AST 16 16 15   ALT 13* 12* 10*  ALKPHOS 54 51 52  BILITOT 0.4 0.4 0.5  PROT 4.7* 4.6* 4.9*  ALBUMIN 3.1* 3.0* 3.2*   CBC:  Recent Labs Lab 11/01/14 1125 11/02/14 0420 11/04/14 0417 11/06/14 0413 11/07/14 0410  WBC 31.8* 22.0* 15.4* 20.4* 20.8*  NEUTROABS 2.5  --   --   --   --   HGB 11.6* 10.3* 9.8* 10.1* 10.0*  HCT 36.6 32.0* 30.4* 31.3* 30.7*  MCV 71.2* 71.3* 71.4* 71.1* 71.2*  PLT 100* 99* 78* 70* 70*   Recent Results (from the past 240 hour(s))  Urine culture     Status: None   Collection Time: 11/01/14 10:53 AM  Result Value Ref Range Status   Specimen Description URINE, RANDOM  Final   Special Requests NONE  Final   Colony Count   Final    2,000 COLONIES/ML Performed at [facility]    Culture   Final    INSIGNIFICANT GROWTH Performed at [facility]    Report Status 11/02/2014 FINAL  Final      Scheduled Meds: . ciprofloxacin  400 mg Intravenous Q12H  . feeding supplement (ENSURE ENLIVE)  237 mL Oral BID BM  . feeding supplement (ENSURE)  1 Container Oral TID BM  . gabapentin  100 mg Oral Daily  . metronidazole  500 mg Intravenous Q8H  . pantoprazole  40 mg Oral Daily  . predniSONE  5 mg Oral Q breakfast  . saccharomyces boulardii  250 mg Oral BID   Continuous Infusions: . sodium chloride 10 mL/hr at 11/05/14 1948

## 2014-11-07 NOTE — Progress Notes (Signed)
Nutrition Follow-up  DOCUMENTATION CODES:  Non-severe (moderate) malnutrition in context of chronic illness  INTERVENTION:  Ensure Enlive (each supplement provides 350kcal and 20 grams of protein)  Provide daily snacks Encourage PO intake (small frequent meals) RD to continue to monitor  NUTRITION DIAGNOSIS:  Malnutrition related to chronic illness as evidenced by mild depletion of body fat, moderate depletions of muscle mass.  Ongoing.  GOAL:  Patient will meet greater than or equal to 90% of their needs  unmet  MONITOR:  PO intake, Supplement acceptance, Labs, Weight trends, Skin, I & O's    ASSESSMENT: 81 y.o. female with CLL, diverticulitis, asthma, presented to the ED from home after being instructed by GI to present to the ED as she has persistent abscess and colovesical fistula.   Pt continues to improve with her PO intake. ~50%. Pt in room with son at bedside. Per son, pt ate mushroom soup, 1/2 slice of bread, a little cottage cheese and a slice of pear for lunch. Pt has been drinking at least 1 Ensure daily and eats the Ensure pudding provided with her meds. Encouraged pt to continue to drink and eat supplements. Pt would like snacks provided, RD to order.  Labs reviewed: Low K & BUN  Height:  Ht Readings from Last 1 Encounters:  11/01/14 4\' 8"  (1.422 m)    Weight:  Wt Readings from Last 1 Encounters:  11/01/14 123 lb 0.3 oz (55.8 kg)    Ideal Body Weight:  42.4 kg  Wt Readings from Last 10 Encounters:  11/01/14 123 lb 0.3 oz (55.8 kg)  10/30/14 125 lb 3.2 oz (56.79 kg)  10/16/14 125 lb (56.7 kg)  10/05/14 128 lb (58.06 kg)  08/20/14 127 lb (57.607 kg)  06/13/14 134 lb 4.8 oz (60.918 kg)  06/09/14 128 lb 4.8 oz (58.196 kg)  11/27/13 126 lb (57.153 kg)  10/05/13 124 lb 12.8 oz (56.609 kg)  01/29/13 109 lb (49.442 kg)    BMI:  Body mass index is 27.6 kg/(m^2).  Re-estimated Nutritional Needs:  Kcal:  1200-1300  Protein:  70-80g  Fluid:   1.2L/day    Skin:  Reviewed, no issues (rash on foot)  Diet Order:  DIET SOFT Room service appropriate?: Yes; Fluid consistency:: Thin  EDUCATION NEEDS:  Education needs addressed   Intake/Output Summary (Last 24 hours) at 11/07/14 1331 Last data filed at 11/07/14 0600  Gross per 24 hour  Intake 474.83 ml  Output      0 ml  Net 474.83 ml    Last BM:  5/10  Clayton Bibles, MS, RD, LDN Pager: [phone] After Hours Pager: [phone]

## 2014-11-07 NOTE — Progress Notes (Signed)
PT Cancellation Note  Patient Details Name: Stefanie Henry MRN: [id number] DOB: 06/08/1930   Cancelled Treatment:    Reason Eval/Treat Not Completed: Patient at procedure or test/unavailable (endoscopy in room to take pt)   [name],[name] 11/07/2014, 9:05 AM Carmelia Bake, PT, DPT 11/07/2014 Pager: [phone]

## 2014-11-07 NOTE — Progress Notes (Signed)
PT Cancellation Note / Screen  Patient Details Name: Stefanie Henry MRN: [id number] DOB: 12-25-1929   Cancelled Treatment:    Reason Eval/Treat Not Completed: PT screened, no needs identified, will sign off Discussed PT with pt at bedside and she denies acute need.  Pt and NT report ambulating in hallway with RW without difficulty.  Pt does report weakness and is interested in HHPT followup upon discharge however continues to decline acute need at this time.  Recommend HHPT follow up upon discharge.  Pt states she has RW she can use at home.   Murad Staples,[name] 11/07/2014, 1:44 PM  Carmelia Bake, PT, DPT 11/07/2014 Pager: [phone]

## 2014-11-07 NOTE — Brief Op Note (Signed)
11/01/2014 - 11/07/2014  10:32 AM  PATIENT:  Stefanie Henry  81 y.o. female  PRE-OPERATIVE DIAGNOSIS:  upper abdominal pain, early satiety  POST-OPERATIVE DIAGNOSIS:  gastric polyps  PROCEDURE:  Procedure(s): ESOPHAGOGASTRODUODENOSCOPY (EGD) (N/A)  And biopsy  SURGEON:  Surgeon(s) and Role:    * Gatha Mayer, MD - Primary  Meds: 25ug Fentanyl and 3 mg midazolam IV  No topical  Findings:  Numerous diminutive flat white polyps in proximal stomach - biopsied. Otherwise normal  Plan: - await pathology

## 2014-11-08 ENCOUNTER — Ambulatory Visit: Payer: Medicare Other | Admitting: Internal Medicine

## 2014-11-08 ENCOUNTER — Encounter (HOSPITAL_COMMUNITY): Payer: Self-pay | Admitting: Internal Medicine

## 2014-11-08 LAB — BASIC METABOLIC PANEL
Anion gap: 8 (ref 5–15)
BUN: <5 mg/dL — ABNORMAL LOW (ref 6–20)
CO2: 26 mmol/L (ref 22–32)
CREATININE: 0.56 mg/dL (ref 0.44–1.00)
Calcium: 8.3 mg/dL — ABNORMAL LOW (ref 8.9–10.3)
Chloride: 100 mmol/L — ABNORMAL LOW (ref 101–111)
GLUCOSE: 114 mg/dL — AB (ref 65–99)
Potassium: 3.1 mmol/L — ABNORMAL LOW (ref 3.5–5.1)
Sodium: 134 mmol/L — ABNORMAL LOW (ref 135–145)

## 2014-11-08 LAB — MAGNESIUM: Magnesium: 1.9 mg/dL (ref 1.7–2.4)

## 2014-11-08 MED ORDER — SIMETHICONE 80 MG PO CHEW
80.0000 mg | CHEWABLE_TABLET | Freq: Four times a day (QID) | ORAL | Status: DC
  Administered 2014-11-08 – 2014-11-13 (×20): 80 mg via ORAL
  Filled 2014-11-08 (×24): qty 1

## 2014-11-08 MED ORDER — HYDROCORTISONE 2.5 % RE CREA
TOPICAL_CREAM | Freq: Four times a day (QID) | RECTAL | Status: DC
Start: 2014-11-08 — End: 2014-11-13
  Administered 2014-11-08 – 2014-11-11 (×7): via RECTAL
  Filled 2014-11-08: qty 28.35

## 2014-11-08 MED ORDER — POTASSIUM CHLORIDE CRYS ER 20 MEQ PO TBCR
40.0000 meq | EXTENDED_RELEASE_TABLET | Freq: Two times a day (BID) | ORAL | Status: AC
  Administered 2014-11-08 (×2): 40 meq via ORAL
  Filled 2014-11-08 (×2): qty 2

## 2014-11-08 MED ORDER — GI COCKTAIL ~~LOC~~
30.0000 mL | Freq: Three times a day (TID) | ORAL | Status: DC
  Administered 2014-11-08 – 2014-11-13 (×17): 30 mL via ORAL
  Filled 2014-11-08 (×19): qty 30

## 2014-11-08 NOTE — Progress Notes (Signed)
    Progress Note   Subjective  Eating breakfast. Still complaints of gas / bloating but better right now. She describes significant bloating and gas yesterday. She had associated LUQ discomfort to the point it was difficult to take a deep breath.  Objective   Vital signs in last 24 hours: Temp:  [97.7 F (36.5 C)-98.6 F (37 C)] 97.7 F (36.5 C) (05/12 0650) Pulse Rate:  [74-87] 74 (05/12 0650) Resp:  [16-25] 20 (05/12 0650) BP: (95-140)/(49-82) 119/60 mmHg (05/12 0650) SpO2:  [96 %-100 %] 98 % (05/12 0650) Last BM Date: 11/07/14 General:    pleasant female in NAD Heart:  Regular rate and rhythm Abdomen:  Soft, nontender and nondistended. Normal bowel sounds. Extremities:  Without edema. Neurologic:  Alert and oriented,  grossly normal neurologically. Psych:  Cooperative. Normal mood and affect.    Lab Results:  Recent Labs  11/06/14 0413 11/07/14 0410  WBC 20.4* 20.8*  HGB 10.1* 10.0*  HCT 31.3* 30.7*  PLT 70* 70*   BMET  Recent Labs  11/06/14 0413 11/07/14 0410 11/08/14 0805  NA 133* 136 134*  K 3.4* 3.3* 3.1*  CL 102 100* 100*  CO2 27 27 26   GLUCOSE 122* 106* 114*  BUN <5* <5* <5*  CREATININE 0.50 0.55 0.56  CALCIUM 8.3* 8.4* 8.3*   LFT  Recent Labs  11/06/14 0413  PROT 4.9*  ALBUMIN 3.2*  AST 15  ALT 10*  ALKPHOS 52  BILITOT 0.5     Assessment / Plan:    35. 81 year old female with early satiety / bloating. EGD yesterday revealed on multiple flat polyps (path pending). She has gallstones but no findings of acute cholecystitis.No RUQ pain. Etiology of all the gas / bloating?  She is on [employer]. Also, she is on Flagyl which should have helped if symptoms secondary to small intestinal bacterial overgrowth. Mylicon ordered QID prn, got a dose at 1:30am and does seem to feel better today. Will change to scheduled dosing QID.  2. Hypokalemia, replacement per primary team  2. Diverticulitis, on IV Cipro / flagyl. She didn't tolerate PO antibiotics  at home.  3. CLL   LOS: 7 days   Tye Savoy  11/08/2014, 9:07 AM   [facility] Attending  I have also seen and assessed the patient and agree with the advanced practitioner's assessment and plan. I am not sure why she bloats so much. Await gastric bxs. ? Abx doing some of this Diverticulitis does not seem to be located such to be causative Consider prokinetic trial   I have placed a call to Dr. Redmond Pulling of [place] for guidance on Abx and re-imaging and colonoscopy timing. Per last note he will be seeing her as an outpatient.  Gatha Mayer, MD, [facility] [phone] (pager) 11/08/2014 10:47 AM

## 2014-11-08 NOTE — Progress Notes (Signed)
Quick Note:  Reviewed at hospital  No letter needed ______

## 2014-11-08 NOTE — Progress Notes (Signed)
Patient ID: Stefanie Henry, female   DOB: 03/12/1930, 81 y.o.   MRN: [id number]  [facility] PROGRESS NOTE  Stefanie Henry [id number] DOB: [birth year] DOA: 11/01/2014 PCP: [name], MD   Brief narrative:    81 y.o. female with CLL, diverticulitis, asthma, presented to the ED from home after being instructed by GI to present to the ED as she has persistent abscess and colovesical fistula.   Assessment/Plan:    Sigmoid diverticulitis with abscess - repeat CT as an outpatient showed new air in bladder ?colonovesical fistula - abscess is persistent, measuring 2.5 cm - general surgery consulted, recommending IV antibiotics, no need for intervention - EGD done 11/07/2014, notable for numerous flat white polyps in proximal stomach, biopsied and still awaiting for results - Continuing IV ciprofloxacin and Flagyl as recommended by surgery team  Dysphagia / early satiety - tolerating diet well but still with poor oral intake, persistent abdominal bloating - Continue to follow up on GI team recommendation - Added GI cocktail to see if this will help with symptoms  Hypokalemia - Potassium is still low, we'll continue to supplement, repeat BMP again in the morning - Check Mg level as well  CLL - with associated leukocytosis - Dr. Renne Crigler discussed with Dr. [name], pt was on long standing prednisone, will likely not need it anymore but he recommends a slow taper given prolonged expoure - continue prednisone at 5 mg daily   Hyponatremia - from dehydration Overall remains stable, 134 this  Thrombocytopenia - discontinued Lovenox 5/8 and transitioned to SCD due to persistent drop in Plt  - no signs of active bleeding, Plt remain stable overall  - will repeat CBC in AM  Chronic Asthma - no wheezing, resumed albuterol  DVT prophylaxis - SCD's  Code Status: Full.  Family Communication:  plan of care discussed with the patient Disposition Plan: Barrier to discharge - patient  still with significant abdominal bloating, poor oral intake, awaiting GI and surgery team recommendations and further plan and management   IV access:  Peripheral IV  Procedures and diagnostic studies:    Ct Abdomen Pelvis Wo Contrast  10/31/2014 Residual changes of sigmoid diverticulitis and associated abscess. New air in the bladder raises suspicion for a colonovesical fistula, in the absence of recent bladder catheterization.    Ct Abdomen Pelvis Wo Contrast  10/15/2014  Colonic diverticulosis with evidence of mild inflammatory changes about the sigmoid colon most likely reflecting diverticulitis with a likely 2.4 cm contained perforation/small abscess between the sigmoid colon and bladder.    Dg Chest 1 View  10/16/2014  Mildly increased central pulmonary vascular congestion is noted with possible minimal bilateral perihilar edema.     Mr 3d Recon At Scanner  10/19/2014 Cholelithiasis without evidence of acute cholecystitis, and without evidence of choledocholithiasis or biliary tract obstruction. 2. Although the common bile duct is mildly dilated 8 mm in the porta hepatis, this is within normal limits for a patient this age, and there is no associated intrahepatic biliary ductal dilatation. 3. Extensive iron deposition throughout the liver and spleen, as above. 4. Additional incidental findings, as above.     Mr Abd W/wo Cm/mrcp  10/19/2014  Cholelithiasis without evidence of acute cholecystitis, and without evidence of choledocholithiasis or biliary tract obstruction. 2. Although the common bile duct is mildly dilated 8 mm in the porta hepatis, this is within normal limits for a patient this age, and there is no associated intrahepatic biliary ductal dilatation. 3. Extensive iron deposition throughout the  liver and spleen, as above. 4. Additional incidental findings, as above.     Medical Consultants:  GI  Surgery   Other Consultants:  None  IAnti-Infectives:   Cipro 5/5 --> Flagyl 5/5 -->    Faye Ramsay, MD  [facility] Pager [phone]  If 7PM-7AM, please contact night-coverage [email or web address] Password [facility] 11/08/2014, 11:39 AM   LOS: 7 days   HPI/Subjective: No events overnight.  patient still concerned with abdominal bloating, poor oral intake   Objective: Filed Vitals:   11/07/14 1116 11/07/14 1316 11/07/14 2130 11/08/14 0650  BP: 118/82 109/49 129/67 119/60  Pulse: 84 81 77 74  Temp: 97.8 F (36.6 C) 97.8 F (36.6 C) 98.1 F (36.7 C) 97.7 F (36.5 C)  TempSrc: Oral Oral Oral Oral  Resp: 20 20 20 20   Height:      Weight:      SpO2: 98% 98% 96% 98%    Intake/Output Summary (Last 24 hours) at 11/08/14 1139 Last data filed at 11/08/14 0930  Gross per 24 hour  Intake    540 ml  Output    100 ml  Net    440 ml    Exam:   General:  Pt is alert, follows commands appropriately, not in acute distress  Cardiovascular: Regular rate and rhythm, no rubs, no gallops  Respiratory: Clear to auscultation bilaterally, no wheezing, no crackles, no rhonchi  Abdomen: Soft, tenderin upper abdominal quadrants, left side worse than the right side, non distended, bowel sounds present, no guarding  Extremities:  pulses DP and PT palpable bilaterally  Neuro: Grossly nonfocal  Data Reviewed: Basic Metabolic Panel:  Recent Labs Lab 11/02/14 0420 11/04/14 0417 11/06/14 0413 11/07/14 0410 11/08/14 0805  NA 133* 135 133* 136 134*  K 3.4* 3.3* 3.4* 3.3* 3.1*  CL 103 102 102 100* 100*  CO2 23 27 27 27 26   GLUCOSE 138* 106* 122* 106* 114*  BUN <5* 6 <5* <5* <5*  CREATININE 0.52 0.59 0.50 0.55 0.56  CALCIUM 8.1* 8.3* 8.3* 8.4* 8.3*   Liver Function Tests:  Recent Labs Lab 11/02/14 0420 11/04/14 0417 11/06/14 0413  AST 16 16 15   ALT 13* 12* 10*  ALKPHOS 54 51 52  BILITOT 0.4 0.4 0.5  PROT 4.7* 4.6* 4.9*  ALBUMIN 3.1* 3.0* 3.2*   CBC:  Recent Labs Lab 11/02/14 0420 11/04/14 0417 11/06/14 0413 11/07/14 0410  WBC 22.0* 15.4* 20.4* 20.8*  HGB  10.3* 9.8* 10.1* 10.0*  HCT 32.0* 30.4* 31.3* 30.7*  MCV 71.3* 71.4* 71.1* 71.2*  PLT 99* 78* 70* 70*   Recent Results (from the past 240 hour(s))  Urine culture     Status: None   Collection Time: 11/01/14 10:53 AM  Result Value Ref Range Status   Specimen Description URINE, RANDOM  Final   Special Requests NONE  Final   Colony Count   Final    2,000 COLONIES/ML Performed at [facility]    Culture   Final    INSIGNIFICANT GROWTH Performed at [facility]    Report Status 11/02/2014 FINAL  Final     Scheduled Meds: . ciprofloxacin  400 mg Intravenous Q12H  . feeding supplement (ENSURE ENLIVE)  237 mL Oral BID BM  . feeding supplement (ENSURE)  1 Container Oral TID BM  . gabapentin  100 mg Oral Daily  . gi cocktail  30 mL Oral TID  . metronidazole  500 mg Intravenous Q8H  . pantoprazole  40 mg Oral Daily  . predniSONE  5 mg Oral Q breakfast  . saccharomyces boulardii  250 mg Oral BID  . simethicone  80 mg Oral QID   Continuous Infusions: . sodium chloride 10 mL/hr at 11/05/14 1948

## 2014-11-08 NOTE — Progress Notes (Signed)
   I discussed her care w/ Dr. Redmond Pulling of [facility] - he will be one to see her in f/u.  Plan would be for converting to oral Abx after 7-10 days IV (she is close) and seeing him in f/u in June.  Needs to stay on Abx until sees him.  She can then be sent back to GI to have a colonoscopy if surgery is planned.  We will sign off i suggest you get any further Tx clarification from [place].  Thanks  Gatha Mayer, MD, [facility] [phone] (pager) 11/08/2014 8:12 PM

## 2014-11-08 NOTE — Care Management Note (Signed)
Case Management Note  Patient Details  Name: Stefanie Henry MRN: [id number] Date of Birth: 02-11-1930  Medicare Important Message Given:  Yes Date Medicare IM Given:  11/08/14 Medicare IM give by:  Marney Doctor RN,BSN,NCM Date Additional Medicare IM Given:    Additional Medicare Important Message give by:     If discussed at [place] of Stay Meetings, dates discussed:    Additional Comments:  Lynnell Catalan, RN 11/08/2014, 1:43 PM

## 2014-11-09 LAB — CBC
HEMATOCRIT: 31.5 % — AB (ref 36.0–46.0)
HEMOGLOBIN: 10.1 g/dL — AB (ref 12.0–15.0)
MCH: 23 pg — ABNORMAL LOW (ref 26.0–34.0)
MCHC: 32.1 g/dL (ref 30.0–36.0)
MCV: 71.6 fL — AB (ref 78.0–100.0)
Platelets: 76 10*3/uL — ABNORMAL LOW (ref 150–400)
RBC: 4.4 MIL/uL (ref 3.87–5.11)
RDW: 14.4 % (ref 11.5–15.5)
WBC: 21.9 10*3/uL — AB (ref 4.0–10.5)

## 2014-11-09 LAB — BASIC METABOLIC PANEL
Anion gap: 7 (ref 5–15)
CALCIUM: 8.3 mg/dL — AB (ref 8.9–10.3)
CO2: 26 mmol/L (ref 22–32)
Chloride: 103 mmol/L (ref 101–111)
Creatinine, Ser: 0.61 mg/dL (ref 0.44–1.00)
GFR calc Af Amer: >60 mL/min (ref >60)
GFR calc non Af Amer: >60 mL/min (ref >60)
Glucose, Bld: 106 mg/dL — ABNORMAL HIGH (ref 65–99)
Potassium: 4.2 mmol/L (ref 3.5–5.1)
SODIUM: 136 mmol/L (ref 135–145)

## 2014-11-09 NOTE — Progress Notes (Signed)
Patient ID: Stefanie Henry, female   DOB: 08-02-1929, 81 y.o.   MRN: [id number]  [facility] PROGRESS NOTE  Stefanie Henry [id number] DOB: 1930/03/25 DOA: 11/01/2014 PCP: [name], MD   Brief narrative:    81 y.o. female with CLL, diverticulitis, asthma, presented to the ED from home after being instructed by GI to present to the ED as she has persistent abscess and colovesical fistula.   Assessment/Plan:    Sigmoid diverticulitis with abscess - repeat CT as an outpatient showed new air in bladder ?colonovesical fistula - abscess is persistent, measuring 2.5 cm - general surgery consulted, recommending IV antibiotics, no need for intervention - EGD done 11/07/2014, notable for numerous flat white polyps in proximal stomach, biopsied and still awaiting for results - We'll continue current antibiotics ciprofloxacin IV but will try discontinue Flagyl as this seems to be contributing to patient's flatulence - Plan on repeating CT abdomen pelvis in the morning to follow-up  Dysphagia / early satiety - tolerating diet well but still with poor oral intake, persistent abdominal bloating - Continue to follow up on GI team recommendation - As noted above, we'll try discontinuing Flagyl to see this will help with symptom control, continuing ciprofloxacin IV  Hypokalemia - Potassium has been supplemented and is within normal limits this morning - We'll repeat BMP again in the morning  CLL - with associated leukocytosis - Dr. Renne Crigler discussed with Dr. [name], pt was on long standing prednisone, will likely not need it anymore but he recommends a slow taper given prolonged expoure - continue prednisone at 5 mg daily   Hyponatremia - Stable this morning, 136 which is within normal limits  Thrombocytopenia - discontinued Lovenox 5/8 and transitioned to SCD due to persistent drop in Plt  - no signs of active bleeding, Plt remain stable overall  - will repeat CBC in AM  Chronic  Asthma - no wheezing, resumed albuterol  DVT prophylaxis - SCD's  Code Status: Full.  Family Communication:  plan of care discussed with the patient Disposition Plan: Barrier to discharge - patient still with significant abdominal bloating, poor oral intake, trying to discontinue Flagyl to see if this will help with patient's symptoms, plan to repeat CT abdomen and pelvis in the morning  IV access:  Peripheral IV  Procedures and diagnostic studies:    Ct Abdomen Pelvis Wo Contrast  10/31/2014 Residual changes of sigmoid diverticulitis and associated abscess. New air in the bladder raises suspicion for a colonovesical fistula, in the absence of recent bladder catheterization.    Ct Abdomen Pelvis Wo Contrast  10/15/2014  Colonic diverticulosis with evidence of mild inflammatory changes about the sigmoid colon most likely reflecting diverticulitis with a likely 2.4 cm contained perforation/small abscess between the sigmoid colon and bladder.    Dg Chest 1 View  10/16/2014  Mildly increased central pulmonary vascular congestion is noted with possible minimal bilateral perihilar edema.     Mr 3d Recon At Scanner  10/19/2014 Cholelithiasis without evidence of acute cholecystitis, and without evidence of choledocholithiasis or biliary tract obstruction. 2. Although the common bile duct is mildly dilated 8 mm in the porta hepatis, this is within normal limits for a patient this age, and there is no associated intrahepatic biliary ductal dilatation. 3. Extensive iron deposition throughout the liver and spleen, as above. 4. Additional incidental findings, as above.     Mr Abd W/wo Cm/mrcp  10/19/2014  Cholelithiasis without evidence of acute cholecystitis, and without evidence of choledocholithiasis or biliary tract  obstruction. 2. Although the common bile duct is mildly dilated 8 mm in the porta hepatis, this is within normal limits for a patient this age, and there is no associated intrahepatic biliary  ductal dilatation. 3. Extensive iron deposition throughout the liver and spleen, as above. 4. Additional incidental findings, as above.     Medical Consultants:  GI  Surgery   Other Consultants:  None  IAnti-Infectives:   Cipro 5/5 --> Flagyl 5/5 --> 5/13  Faye Ramsay, MD  [facility] Pager [phone]  If 7PM-7AM, please contact night-coverage [email or web address] Password [facility] 11/09/2014, 12:38 PM   LOS: 8 days   HPI/Subjective: No events overnight.  patient still concerned with abdominal bloating, poor oral intake   Objective: Filed Vitals:   11/08/14 0650 11/08/14 1357 11/08/14 2031 11/09/14 0449  BP: 119/60 114/51 114/54 124/63  Pulse: 74 86 79 94  Temp: 97.7 F (36.5 C) 98.6 F (37 C) 98.3 F (36.8 C) 98.3 F (36.8 C)  TempSrc: Oral Oral Oral Oral  Resp: 20 18 18 18   Height:      Weight:      SpO2: 98% 98% 98% 97%    Intake/Output Summary (Last 24 hours) at 11/09/14 1238 Last data filed at 11/09/14 0935  Gross per 24 hour  Intake   1180 ml  Output   1650 ml  Net   -470 ml    Exam:   General:  Pt is alert, follows commands appropriately, not in acute distress  Cardiovascular: Regular rate and rhythm, no rubs, no gallops  Respiratory: Clear to auscultation bilaterally, no wheezing, no crackles, no rhonchi  Abdomen: Soft, tenderin upper abdominal quadrants, left side worse than the right side  Extremities:  pulses DP and PT palpable bilaterally  Neuro: Grossly nonfocal  Data Reviewed: Basic Metabolic Panel:  Recent Labs Lab 11/04/14 0417 11/06/14 0413 11/07/14 0410 11/08/14 0805 11/08/14 0830 11/09/14 0407  NA 135 133* 136 134*  --  136  K 3.3* 3.4* 3.3* 3.1*  --  4.2  CL 102 102 100* 100*  --  103  CO2 27 27 27 26   --  26  GLUCOSE 106* 122* 106* 114*  --  106*  BUN 6 <5* <5* <5*  --  <5*  CREATININE 0.59 0.50 0.55 0.56  --  0.61  CALCIUM 8.3* 8.3* 8.4* 8.3*  --  8.3*  MG  --   --   --   --  1.9  --    Liver Function Tests:  Recent  Labs Lab 11/04/14 0417 11/06/14 0413  AST 16 15  ALT 12* 10*  ALKPHOS 51 52  BILITOT 0.4 0.5  PROT 4.6* 4.9*  ALBUMIN 3.0* 3.2*   CBC:  Recent Labs Lab 11/04/14 0417 11/06/14 0413 11/07/14 0410 11/09/14 0407  WBC 15.4* 20.4* 20.8* 21.9*  HGB 9.8* 10.1* 10.0* 10.1*  HCT 30.4* 31.3* 30.7* 31.5*  MCV 71.4* 71.1* 71.2* 71.6*  PLT 78* 70* 70* 76*   Recent Results (from the past 240 hour(s))  Urine culture     Status: None   Collection Time: 11/01/14 10:53 AM  Result Value Ref Range Status   Specimen Description URINE, RANDOM  Final   Special Requests NONE  Final   Colony Count   Final    2,000 COLONIES/ML Performed at [place]   Final    INSIGNIFICANT GROWTH Performed at [facility]    Report Status 11/02/2014 FINAL  Final     Scheduled  Meds: . ciprofloxacin  400 mg Intravenous Q12H  . feeding supplement (ENSURE ENLIVE)  237 mL Oral BID BM  . feeding supplement (ENSURE)  1 Container Oral TID BM  . gabapentin  100 mg Oral Daily  . gi cocktail  30 mL Oral TID  . hydrocortisone   Rectal QID  . pantoprazole  40 mg Oral Daily  . predniSONE  5 mg Oral Q breakfast  . saccharomyces boulardii  250 mg Oral BID  . simethicone  80 mg Oral QID   Continuous Infusions: . sodium chloride 10 mL/hr at 11/05/14 1948

## 2014-11-09 NOTE — Care Management Note (Signed)
Case Management Note  Patient Details  Name: Hadassah Rana MRN: [id number] Date of Birth: 08/23/1929    Expected Discharge Date:                  Expected Discharge Plan:  [place]  In-House Referral:     Discharge planning Services  CM Consult  Post Acute Care Choice:    Choice offered to:     DME Arranged:    DME Agency:     HH Arranged:  PT [place]:  [place]  Status of Service:  In process, will continue to follow  Medicare Important Message Given:  Yes Date Medicare IM Given:  11/08/14 Medicare IM give by:  Marney Doctor RN,BSN,NCM Date Additional Medicare IM Given:    Additional Medicare Important Message give by:     If discussed at [place] of Stay Meetings, dates discussed:    Additional Comments: PT recommending HHPT at DC. Spoke with pt at bedside who asked that I talk to her son about DC plans. Spoke with son Maurene Capes who states they have used [facility] in the past for [facility] services and would like to use them again. Pt has a rolling walker at home.  [facility] rep called to give referral. Will need MD orders for HHPT at DC. Lynnell Catalan, RN 11/09/2014, 12:44 PM

## 2014-11-10 ENCOUNTER — Encounter (HOSPITAL_COMMUNITY): Payer: Self-pay | Admitting: Radiology

## 2014-11-10 ENCOUNTER — Inpatient Hospital Stay (HOSPITAL_COMMUNITY): Payer: Medicare Other

## 2014-11-10 DIAGNOSIS — N321 Vesicointestinal fistula: Secondary | ICD-10-CM | POA: Insufficient documentation

## 2014-11-10 DIAGNOSIS — L0291 Cutaneous abscess, unspecified: Secondary | ICD-10-CM | POA: Insufficient documentation

## 2014-11-10 NOTE — Evaluation (Signed)
Physical Therapy Evaluation Patient Details Name: Stefanie Henry MRN: [id number] DOB: April 15, 1930 Today's Date: 11/10/2014   History of Present Illness  81 y.o. female with h/o L1 compression fx admitted with diverticulitis.  Clinical Impression  Pt is independent with mobility. No further PT nor DME needs. PT signing off.     Follow Up Recommendations No PT follow up    Equipment Recommendations  None recommended by PT    Recommendations for Other Services       Precautions / Restrictions Restrictions Weight Bearing Restrictions: No      Mobility  Bed Mobility Overal bed mobility: Independent                Transfers Overall transfer level: Independent                  Ambulation/Gait Ambulation/Gait assistance: Modified independent (Device/Increase time) Ambulation Distance (Feet): 400 Feet Assistive device: Rolling walker (2 wheeled) Gait Pattern/deviations: WFL(Within Functional Limits)   Gait velocity interpretation: at or above normal speed for age/gender General Gait Details: steady, no LOB  Stairs            Wheelchair Mobility    Modified Rankin (Stroke Patients Only)       Balance Overall balance assessment: Modified Independent                                           Pertinent Vitals/Pain Pain Assessment: No/denies pain    Home Living Family/patient expects to be discharged to:: Private residence Living Arrangements: Children (pt lives with son) Available Help at Discharge: Family;Available 24 hours/day Type of Home: Apartment Home Access: Stairs to enter Entrance Stairs-Rails: Right Entrance Stairs-Number of Steps: 7 with bil rails; threshold; 7 with R rail Home Layout: One level Home Equipment: Cane - quad;Walker - 2 wheels      Prior Function Level of Independence: Independent with assistive device(s)               Hand Dominance   Dominant Hand: Right    Extremity/Trunk Assessment    Upper Extremity Assessment: Overall WFL for tasks assessed           Lower Extremity Assessment: Overall WFL for tasks assessed      Cervical / Trunk Assessment: Normal  Communication   Communication: Prefers language other than English (speaks some [place], Hindi is preferred language)  Cognition Arousal/Alertness: Awake/alert Behavior During Therapy: WFL for tasks assessed/performed Overall Cognitive Status: Within Functional Limits for tasks assessed                      General Comments      Exercises        Assessment/Plan    PT Assessment Patent does not need any further PT services  PT Diagnosis     PT Problem List    PT Treatment Interventions     PT Goals (Current goals can be found in the Care Plan section) Acute Rehab PT Goals PT Goal Formulation: All assessment and education complete, DC therapy    Frequency     Barriers to discharge        Co-evaluation               End of Session Equipment Utilized During Treatment: Gait belt Activity Tolerance: Patient tolerated treatment well Patient left: in bed;with call bell/phone within reach Nurse  Communication: Mobility status         Time: 1211-1227 PT Time Calculation (min) (ACUTE ONLY): 16 min   Charges:   PT Evaluation $Initial PT Evaluation Tier I: 1 Procedure     PT G Codes:        Philomena Doheny 11/10/2014, 12:45 PM [phone]

## 2014-11-10 NOTE — Progress Notes (Signed)
Patient ID: Stefanie Henry, female   DOB: 30-Jan-1930, 81 y.o.   MRN: [id number]  [facility] PROGRESS NOTE  Stefanie Henry [id number]:[id number] DOB: 11-20-1929 DOA: 11/01/2014 PCP: [name], MD   Brief narrative:    81 y.o. female with CLL, diverticulitis, asthma, presented to the ED from home after being instructed by GI to present to the ED as she has persistent abscess and colovesical fistula.   Assessment/Plan:    Sigmoid diverticulitis with abscess - repeat CT as an outpatient showed new air in bladder ?colonovesical fistula - abscess is persistent, measuring 2.5 cm - general surgery consulted, recommending IV antibiotics, no need for intervention - EGD done 11/07/2014, notable for numerous flat white polyps in proximal stomach, biopsied and still awaiting for results - we are continuing Cipro IV and Flagyl IV has been discontinued to see if pt will feel better 5/13 - pt reports feeling better today and less flatulence  - Plan on repeating CT abdomen pelvis today without contrast due to allergy to IV contrast   Dysphagia / early satiety - tolerating diet well and reports appetite much better with less flatulence  - continuing ciprofloxacin IV  Hypokalemia - Potassium has been supplemented  - repeat BMP in AM  CLL - with associated leukocytosis - Dr. Renne Crigler discussed with Dr. [name], pt was on long standing prednisone, will likely not need it anymore but he recommends a slow taper given prolonged expoure - continue prednisone at 5 mg daily   Hyponatremia - remains overall stable, will repeat BMP in AM  Thrombocytopenia - discontinued Lovenox 5/8 and transitioned to SCD due to persistent drop in Plt  - no signs of active bleeding, Plt remain stable overall  - will repeat CBC in AM  Chronic Asthma - no wheezing, resumed albuterol  DVT prophylaxis - SCD's  Code Status: Full.  Family Communication:  plan of care discussed with the patient Disposition Plan:  Barrier to discharge - patient needs repeat CT abd today and possible d/c 5/15  IV access:  Peripheral IV  Procedures and diagnostic studies:    Ct Abdomen Pelvis Wo Contrast  10/31/2014 Residual changes of sigmoid diverticulitis and associated abscess. New air in the bladder raises suspicion for a colonovesical fistula, in the absence of recent bladder catheterization.    Ct Abdomen Pelvis Wo Contrast  10/15/2014  Colonic diverticulosis with evidence of mild inflammatory changes about the sigmoid colon most likely reflecting diverticulitis with a likely 2.4 cm contained perforation/small abscess between the sigmoid colon and bladder.    Dg Chest 1 View  10/16/2014  Mildly increased central pulmonary vascular congestion is noted with possible minimal bilateral perihilar edema.     Mr 3d Recon At Scanner  10/19/2014 Cholelithiasis without evidence of acute cholecystitis, and without evidence of choledocholithiasis or biliary tract obstruction. 2. Although the common bile duct is mildly dilated 8 mm in the porta hepatis, this is within normal limits for a patient this age, and there is no associated intrahepatic biliary ductal dilatation. 3. Extensive iron deposition throughout the liver and spleen, as above. 4. Additional incidental findings, as above.     Mr Abd W/wo Cm/mrcp  10/19/2014  Cholelithiasis without evidence of acute cholecystitis, and without evidence of choledocholithiasis or biliary tract obstruction. 2. Although the common bile duct is mildly dilated 8 mm in the porta hepatis, this is within normal limits for a patient this age, and there is no associated intrahepatic biliary ductal dilatation. 3. Extensive iron deposition throughout the liver  and spleen, as above. 4. Additional incidental findings, as above.     Medical Consultants:  GI  Surgery   Other Consultants:  None  IAnti-Infectives:   Cipro 5/5 --> Flagyl 5/5 --> 5/13  Faye Ramsay, MD  [facility] Pager [phone]  If  7PM-7AM, please contact night-coverage [email or web address] Password [email or web address] 11/10/2014, 11:24 AM   LOS: 9 days   HPI/Subjective: Reports feeling better.  Objective: Filed Vitals:   11/09/14 0449 11/09/14 1400 11/09/14 2059 11/10/14 0510  BP: 124/63 122/79 125/67 114/69  Pulse: 94 72 77 79  Temp: 98.3 F (36.8 C) 98.4 F (36.9 C) 98.4 F (36.9 C) 97.8 F (36.6 C)  TempSrc: Oral Oral Oral Oral  Resp: 18 18 18 18   Height:      Weight:      SpO2: 97% 97% 99% 100%    Intake/Output Summary (Last 24 hours) at 11/10/14 1124 Last data filed at 11/10/14 0900  Gross per 24 hour  Intake    880 ml  Output      0 ml  Net    880 ml    Exam:   General:  Pt is alert, follows commands appropriately, not in acute distress  Cardiovascular: Regular rate and rhythm, no rubs, no gallops  Respiratory: Clear to auscultation bilaterally, no wheezing, no crackles, no rhonchi  Abdomen: Soft, non tender, no guarding  Extremities:  pulses DP and PT palpable bilaterally  Neuro: Grossly nonfocal  Data Reviewed: Basic Metabolic Panel:  Recent Labs Lab 11/04/14 0417 11/06/14 0413 11/07/14 0410 11/08/14 0805 11/08/14 0830 11/09/14 0407  NA 135 133* 136 134*  --  136  K 3.3* 3.4* 3.3* 3.1*  --  4.2  CL 102 102 100* 100*  --  103  CO2 27 27 27 26   --  26  GLUCOSE 106* 122* 106* 114*  --  106*  BUN 6 <5* <5* <5*  --  <5*  CREATININE 0.59 0.50 0.55 0.56  --  0.61  CALCIUM 8.3* 8.3* 8.4* 8.3*  --  8.3*  MG  --   --   --   --  1.9  --    Liver Function Tests:  Recent Labs Lab 11/04/14 0417 11/06/14 0413  AST 16 15  ALT 12* 10*  ALKPHOS 51 52  BILITOT 0.4 0.5  PROT 4.6* 4.9*  ALBUMIN 3.0* 3.2*   CBC:  Recent Labs Lab 11/04/14 0417 11/06/14 0413 11/07/14 0410 11/09/14 0407  WBC 15.4* 20.4* 20.8* 21.9*  HGB 9.8* 10.1* 10.0* 10.1*  HCT 30.4* 31.3* 30.7* 31.5*  MCV 71.4* 71.1* 71.2* 71.6*  PLT 78* 70* 70* 76*   Recent Results (from the past 240 hour(s))  Urine culture      Status: None   Collection Time: 11/01/14 10:53 AM  Result Value Ref Range Status   Specimen Description URINE, RANDOM  Final   Special Requests NONE  Final   Colony Count   Final    2,000 COLONIES/ML Performed at [facility]    Culture   Final    INSIGNIFICANT GROWTH Performed at [facility]    Report Status 11/02/2014 FINAL  Final     Scheduled Meds: . ciprofloxacin  400 mg Intravenous Q12H  . feeding supplement (ENSURE ENLIVE)  237 mL Oral BID BM  . feeding supplement (ENSURE)  1 Container Oral TID BM  . gabapentin  100 mg Oral Daily  . gi cocktail  30 mL Oral TID  . hydrocortisone   Rectal QID  .  pantoprazole  40 mg Oral Daily  . predniSONE  5 mg Oral Q breakfast  . saccharomyces boulardii  250 mg Oral BID  . simethicone  80 mg Oral QID   Continuous Infusions: . sodium chloride 10 mL/hr at 11/05/14 1948

## 2014-11-11 LAB — CBC
HCT: 33.2 % — ABNORMAL LOW (ref 36.0–46.0)
Hemoglobin: 10.2 g/dL — ABNORMAL LOW (ref 12.0–15.0)
MCH: 21.9 pg — ABNORMAL LOW (ref 26.0–34.0)
MCHC: 30.7 g/dL (ref 30.0–36.0)
MCV: 71.4 fL — ABNORMAL LOW (ref 78.0–100.0)
PLATELETS: 74 10*3/uL — AB (ref 150–400)
RBC: 4.65 MIL/uL (ref 3.87–5.11)
RDW: 14.4 % (ref 11.5–15.5)
WBC: 25.9 10*3/uL — ABNORMAL HIGH (ref 4.0–10.5)

## 2014-11-11 LAB — BASIC METABOLIC PANEL WITH GFR
Anion gap: 8 (ref 5–15)
BUN: 6 mg/dL (ref 6–20)
CO2: 26 mmol/L (ref 22–32)
Calcium: 8.2 mg/dL — ABNORMAL LOW (ref 8.9–10.3)
Chloride: 97 mmol/L — ABNORMAL LOW (ref 101–111)
Creatinine, Ser: 0.54 mg/dL (ref 0.44–1.00)
GFR calc Af Amer: >60 mL/min
GFR calc non Af Amer: >60 mL/min
Glucose, Bld: 108 mg/dL — ABNORMAL HIGH (ref 65–99)
Potassium: 3.5 mmol/L (ref 3.5–5.1)
Sodium: 131 mmol/L — ABNORMAL LOW (ref 135–145)

## 2014-11-11 NOTE — Progress Notes (Signed)
Patient ID: Stefanie Henry, female   DOB: December 06, 1929, 81 y.o.   MRN: [id number]  [facility] PROGRESS NOTE  Stefanie Henry [id number] DOB: 03-20-1930 DOA: 11/01/2014 PCP: Stefanie Napoleon, MD   Brief narrative:    81 y.o. female with CLL, diverticulitis, asthma, presented to the ED from home after being instructed by GI to present to the ED as she has persistent abscess and colovesical fistula.   Assessment/Plan:    Sigmoid diverticulitis with abscess - repeat CT as an outpatient showed new air in bladder ?colonovesical fistula - abscess is persistent, measuring 2.5 cm - general surgery consulted, recommending IV antibiotics, no need for intervention - EGD done 11/07/2014, notable for numerous flat white polyps in proximal stomach, biopsied and still awaiting for results - we are continuing Cipro IV day #11/14, Flagyl IV has been discontinued to see if pt will feel better since 5/13 - pt reports feeling better today and less flatulence, better appetite  - repeat CT abd 5/14 with resolving mild diverticulitis   Dysphagia / early satiety - tolerating diet well and reports appetite much better with less flatulence  - continuing ciprofloxacin IV day #11/15  Hypokalemia - Potassium has been supplemented and remains within normal limits  - repeat BMP in AM  CLL - with associated leukocytosis - Dr. Renne Henry discussed with Dr. Marin Henry, pt was on long standing prednisone, will likely not need it anymore but he recommends a slow taper given prolonged expoure - continue prednisone at 5 mg daily   Hyponatremia - remains overall stable, will repeat BMP in AM  Thrombocytopenia - discontinued Lovenox 5/8 and transitioned to SCD due to persistent drop in Plt  - no signs of active bleeding, Plt remain stable overall  - will repeat CBC in AM  Chronic Asthma - no wheezing, pt maintaining oxygen saturation at target range - continue bronchodilators   DVT prophylaxis - SCD's  Code  Status: Full.  Family Communication:  plan of care discussed with the patient and son  Disposition Plan: Barrier to discharge - patient unable to tolerate PO ABX and needs IV ABX   IV access:  Peripheral IV  Procedures and diagnostic studies:    Ct Abdomen Pelvis Wo Contrast  10/31/2014 Residual changes of sigmoid diverticulitis and associated abscess. New air in the bladder raises suspicion for a colonovesical fistula, in the absence of recent bladder catheterization.    Ct Abdomen Pelvis Wo Contrast  10/15/2014  Colonic diverticulosis with evidence of mild inflammatory changes about the sigmoid colon most likely reflecting diverticulitis with a likely 2.4 cm contained perforation/small abscess between the sigmoid colon and bladder.    Dg Chest 1 View  10/16/2014  Mildly increased central pulmonary vascular congestion is noted with possible minimal bilateral perihilar edema.     Mr 3d Recon At Scanner  10/19/2014 Cholelithiasis without evidence of acute cholecystitis, and without evidence of choledocholithiasis or biliary tract obstruction. 2. Although the common bile duct is mildly dilated 8 mm in the porta hepatis, this is within normal limits for a patient this age, and there is no associated intrahepatic biliary ductal dilatation. 3. Extensive iron deposition throughout the liver and spleen, as above. 4. Additional incidental findings, as above.     Mr Abd W/wo Cm/mrcp  10/19/2014  Cholelithiasis without evidence of acute cholecystitis, and without evidence of choledocholithiasis or biliary tract obstruction. 2. Although the common bile duct is mildly dilated 8 mm in the porta hepatis, this is within normal limits for a patient this  age, and there is no associated intrahepatic biliary ductal dilatation. 3. Extensive iron deposition throughout the liver and spleen, as above. 4. Additional incidental findings, as above.     Ct Abdomen Pelvis Wo Contrast  11/10/2014  Small fluid collection positioned  between the left aspect of the bladder and sigmoid colon is decreased slightly in volume compared to prior. Demonstration of connection with the sigmoid colon may be evaluated with rectal contrast in the future or a barium enema. 2. Small amount a gas within the bladder is not changed. 3. Sigmoid thickening consistent with resolving mild diverticulitis   Medical Consultants:  GI  Surgery   Other Consultants:  None  IAnti-Infectives:   Cipro 5/5 --> Flagyl 5/5 --> 5/13  Stefanie Ramsay, MD  [facility] Pager [phone]  If 7PM-7AM, please contact night-coverage [email or web address] Password [email or web address] 11/11/2014, 11:20 AM   LOS: 10 days   HPI/Subjective: Reports feeling better.Still reports mild bloating but overall better.   Objective: Filed Vitals:   11/10/14 1100 11/10/14 1320 11/10/14 2047 11/11/14 0432  BP: 122/63 114/71 114/81 120/60  Pulse: 85 88 83 71  Temp:  98.5 F (36.9 C) 98.3 F (36.8 C) 98.3 F (36.8 C)  TempSrc:  Oral Oral Oral  Resp:  16 16 16   Height:      Weight:      SpO2:  100% 98% 97%    Intake/Output Summary (Last 24 hours) at 11/11/14 1120 Last data filed at 11/11/14 8182  Gross per 24 hour  Intake 1125.83 ml  Output      0 ml  Net 1125.83 ml    Exam:   General:  Pt is alert, follows commands appropriately, not in acute distress  Cardiovascular: Regular rate and rhythm, no rubs, no gallops  Respiratory: Clear to auscultation bilaterally, no wheezing, diminished breath sounds at bases   Abdomen: Soft, non tender, no guarding  Extremities:  pulses DP and PT palpable bilaterally  Neuro: Grossly nonfocal  Data Reviewed: Basic Metabolic Panel:  Recent Labs Lab 11/06/14 0413 11/07/14 0410 11/08/14 0805 11/08/14 0830 11/09/14 0407 11/11/14 0432  NA 133* 136 134*  --  136 131*  K 3.4* 3.3* 3.1*  --  4.2 3.5  CL 102 100* 100*  --  103 97*  CO2 27 27 26   --  26 26  GLUCOSE 122* 106* 114*  --  106* 108*  BUN <5* <5* <5*  --  <5* 6  CREATININE  0.50 0.55 0.56  --  0.61 0.54  CALCIUM 8.3* 8.4* 8.3*  --  8.3* 8.2*  MG  --   --   --  1.9  --   --    Liver Function Tests:  Recent Labs Lab 11/06/14 0413  AST 15  ALT 10*  ALKPHOS 52  BILITOT 0.5  PROT 4.9*  ALBUMIN 3.2*   CBC:  Recent Labs Lab 11/06/14 0413 11/07/14 0410 11/09/14 0407 11/11/14 0432  WBC 20.4* 20.8* 21.9* 25.9*  HGB 10.1* 10.0* 10.1* 10.2*  HCT 31.3* 30.7* 31.5* 33.2*  MCV 71.1* 71.2* 71.6* 71.4*  PLT 70* 70* 76* 74*    Scheduled Meds: . ciprofloxacin  400 mg Intravenous Q12H  . feeding supplement (ENSURE ENLIVE)  237 mL Oral BID BM  . feeding supplement (ENSURE)  1 Container Oral TID BM  . gabapentin  100 mg Oral Daily  . gi cocktail  30 mL Oral TID  . hydrocortisone   Rectal QID  . pantoprazole  40 mg Oral Daily  .  predniSONE  5 mg Oral Q breakfast  . saccharomyces boulardii  250 mg Oral BID  . simethicone  80 mg Oral QID   Continuous Infusions: . sodium chloride 10 mL/hr at 11/10/14 1614

## 2014-11-12 DIAGNOSIS — L0291 Cutaneous abscess, unspecified: Secondary | ICD-10-CM

## 2014-11-12 LAB — BASIC METABOLIC PANEL
Anion gap: 8 (ref 5–15)
BUN: 6 mg/dL (ref 6–20)
CALCIUM: 8 mg/dL — AB (ref 8.9–10.3)
CO2: 27 mmol/L (ref 22–32)
Chloride: 96 mmol/L — ABNORMAL LOW (ref 101–111)
Creatinine, Ser: 0.53 mg/dL (ref 0.44–1.00)
GFR calc Af Amer: >60 mL/min (ref >60)
Glucose, Bld: 105 mg/dL — ABNORMAL HIGH (ref 65–99)
Potassium: 3.4 mmol/L — ABNORMAL LOW (ref 3.5–5.1)
Sodium: 131 mmol/L — ABNORMAL LOW (ref 135–145)

## 2014-11-12 LAB — CBC
HCT: 32 % — ABNORMAL LOW (ref 36.0–46.0)
Hemoglobin: 10.4 g/dL — ABNORMAL LOW (ref 12.0–15.0)
MCH: 22.9 pg — AB (ref 26.0–34.0)
MCHC: 32.5 g/dL (ref 30.0–36.0)
MCV: 70.5 fL — ABNORMAL LOW (ref 78.0–100.0)
PLATELETS: 73 10*3/uL — AB (ref 150–400)
RBC: 4.54 MIL/uL (ref 3.87–5.11)
RDW: 14.4 % (ref 11.5–15.5)
WBC: 28.6 10*3/uL — ABNORMAL HIGH (ref 4.0–10.5)

## 2014-11-12 MED ORDER — FLUCONAZOLE 100MG IVPB
100.0000 mg | INTRAVENOUS | Status: DC
  Administered 2014-11-12 – 2014-11-13 (×2): 100 mg via INTRAVENOUS
  Filled 2014-11-12 (×2): qty 50

## 2014-11-12 NOTE — Telephone Encounter (Signed)
Go to ER

## 2014-11-12 NOTE — Progress Notes (Signed)
Patient ID: Stefanie Henry, female   DOB: 1930/06/01, 81 y.o.   MRN: [id number]  [facility] PROGRESS NOTE  Lyrik Dockstader [id number]:[id number] DOB: 01-01-1930 DOA: 11/01/2014 PCP: [name], MD   Brief narrative:    81 y.o. female with CLL, diverticulitis, asthma, presented to the ED from home after being instructed by GI to present to the ED as she has persistent abscess and colovesical fistula.   Assessment/Plan:    Sigmoid diverticulitis with abscess - repeat CT as an outpatient showed new air in bladder ?colonovesical fistula - abscess is persistent, measuring 2.5 cm - general surgery consulted, recommending IV antibiotics, no need for intervention - EGD done 11/07/2014, notable for numerous flat white polyps in proximal stomach, biopsied and still awaiting for results - we are continuing Cipro IV day #12/14, Flagyl IV has been discontinued to see if pt will feel better since 5/13 - repeat CT abd 5/14 with resolving mild diverticulitis  - have to be inpatient as pt still unable to tolerate PO ABX  Dysphagia / early satiety - tolerating diet well and reports appetite much better with less flatulence  - continuing ciprofloxacin IV day #12/14  Hypokalemia - Potassium has been supplemented and remains on low side - will supplement - repeat BMP in AM  CLL - with associated leukocytosis - Dr. Renne Crigler discussed with Dr. [name], pt was on long standing prednisone, will likely not need it anymore but he recommends a slow taper given prolonged expoure - continue prednisone at 5 mg daily   Hyponatremia - remains overall stable, will repeat BMP in AM  Oral thrush - place on Diflucan IV and see if this helps   Thrombocytopenia - discontinued Lovenox 5/8 and transitioned to SCD due to persistent drop in Plt  - no signs of active bleeding, Plt remain stable overall  - will repeat CBC in AM  Chronic Asthma - no wheezing, pt maintaining oxygen saturation at target range - will  ask to have oxygen checked with ambulation and at rest to see if pt qualifies for home oxygen  - continue bronchodilators   DVT prophylaxis - SCD's  Code Status: Full.  Family Communication:  plan of care discussed with the patient and son  Disposition Plan: Barrier to discharge - patient unable to tolerate PO ABX and needs IV ABX   IV access:  Peripheral IV  Procedures and diagnostic studies:    Ct Abdomen Pelvis Wo Contrast  10/31/2014 Residual changes of sigmoid diverticulitis and associated abscess. New air in the bladder raises suspicion for a colonovesical fistula, in the absence of recent bladder catheterization.    Ct Abdomen Pelvis Wo Contrast  10/15/2014  Colonic diverticulosis with evidence of mild inflammatory changes about the sigmoid colon most likely reflecting diverticulitis with a likely 2.4 cm contained perforation/small abscess between the sigmoid colon and bladder.    Dg Chest 1 View  10/16/2014  Mildly increased central pulmonary vascular congestion is noted with possible minimal bilateral perihilar edema.     Mr 3d Recon At Scanner  10/19/2014 Cholelithiasis without evidence of acute cholecystitis, and without evidence of choledocholithiasis or biliary tract obstruction. 2. Although the common bile duct is mildly dilated 8 mm in the porta hepatis, this is within normal limits for a patient this age, and there is no associated intrahepatic biliary ductal dilatation. 3. Extensive iron deposition throughout the liver and spleen, as above. 4. Additional incidental findings, as above.     Mr Abd W/wo Cm/mrcp  10/19/2014  Cholelithiasis  without evidence of acute cholecystitis, and without evidence of choledocholithiasis or biliary tract obstruction. 2. Although the common bile duct is mildly dilated 8 mm in the porta hepatis, this is within normal limits for a patient this age, and there is no associated intrahepatic biliary ductal dilatation. 3. Extensive iron deposition throughout  the liver and spleen, as above. 4. Additional incidental findings, as above.     Ct Abdomen Pelvis Wo Contrast  11/10/2014  Small fluid collection positioned between the left aspect of the bladder and sigmoid colon is decreased slightly in volume compared to prior. Demonstration of connection with the sigmoid colon may be evaluated with rectal contrast in the future or a barium enema. 2. Small amount a gas within the bladder is not changed. 3. Sigmoid thickening consistent with resolving mild diverticulitis   Medical Consultants:  GI  Surgery   Other Consultants:  None  IAnti-Infectives:   Cipro 5/5 --> Flagyl 5/5 --> 5/13  Faye Ramsay, MD  [facility] Pager [phone]  If 7PM-7AM, please contact night-coverage [email or web address] Password [email or web address] 11/12/2014, 11:22 AM   LOS: 11 days   HPI/Subjective: Reports feeling better. Still reports mild bloating but overall better.   Objective: Filed Vitals:   11/11/14 0432 11/11/14 1340 11/11/14 2009 11/12/14 0441  BP: 120/60 119/67 109/58 113/60  Pulse: 71 92 86 70  Temp: 98.3 F (36.8 C) 98.1 F (36.7 C) 98 F (36.7 C) 97.4 F (36.3 C)  TempSrc: Oral Oral Oral Oral  Resp: 16 16 16 16   Height:      Weight:      SpO2: 97% 98% 99% 99%    Intake/Output Summary (Last 24 hours) at 11/12/14 1122 Last data filed at 11/12/14 0615  Gross per 24 hour  Intake 402.83 ml  Output      0 ml  Net 402.83 ml    Exam:   General:  Pt is alert, follows commands appropriately, not in acute distress  Cardiovascular: Regular rate and rhythm, no rubs, no gallops  Respiratory: Clear to auscultation bilaterally, no wheezing, diminished breath sounds at bases   Abdomen: Soft, non tender, no guarding, Bowel sounds present   Extremities:  pulses DP and PT palpable bilaterally  Neuro: Grossly nonfocal  Data Reviewed: Basic Metabolic Panel:  Recent Labs Lab 11/07/14 0410 11/08/14 0805 11/08/14 0830 11/09/14 0407 11/11/14 0432 11/12/14 0435   NA 136 134*  --  136 131* 131*  K 3.3* 3.1*  --  4.2 3.5 3.4*  CL 100* 100*  --  103 97* 96*  CO2 27 26  --  26 26 27   GLUCOSE 106* 114*  --  106* 108* 105*  BUN <5* <5*  --  <5* 6 6  CREATININE 0.55 0.56  --  0.61 0.54 0.53  CALCIUM 8.4* 8.3*  --  8.3* 8.2* 8.0*  MG  --   --  1.9  --   --   --    Liver Function Tests:  Recent Labs Lab 11/06/14 0413  AST 15  ALT 10*  ALKPHOS 52  BILITOT 0.5  PROT 4.9*  ALBUMIN 3.2*   CBC:  Recent Labs Lab 11/06/14 0413 11/07/14 0410 11/09/14 0407 11/11/14 0432 11/12/14 0435  WBC 20.4* 20.8* 21.9* 25.9* 28.6*  HGB 10.1* 10.0* 10.1* 10.2* 10.4*  HCT 31.3* 30.7* 31.5* 33.2* 32.0*  MCV 71.1* 71.2* 71.6* 71.4* 70.5*  PLT 70* 70* 76* 74* 73*    Scheduled Meds: . ciprofloxacin  400 mg Intravenous Q12H  . feeding supplement (  ENSURE ENLIVE)  237 mL Oral BID BM  . feeding supplement (ENSURE)  1 Container Oral TID BM  . fluconazole (DIFLUCAN) IV  100 mg Intravenous Q24H  . gabapentin  100 mg Oral Daily  . gi cocktail  30 mL Oral TID  . hydrocortisone   Rectal QID  . pantoprazole  40 mg Oral Daily  . predniSONE  5 mg Oral Q breakfast  . saccharomyces boulardii  250 mg Oral BID  . simethicone  80 mg Oral QID   Continuous Infusions: . sodium chloride 10 mL/hr at 11/11/14 2200

## 2014-11-13 DIAGNOSIS — N321 Vesicointestinal fistula: Secondary | ICD-10-CM

## 2014-11-13 LAB — CBC
HEMATOCRIT: 32.8 % — AB (ref 36.0–46.0)
Hemoglobin: 10.5 g/dL — ABNORMAL LOW (ref 12.0–15.0)
MCH: 22.8 pg — ABNORMAL LOW (ref 26.0–34.0)
MCHC: 32 g/dL (ref 30.0–36.0)
MCV: 71.3 fL — AB (ref 78.0–100.0)
Platelets: 79 10*3/uL — ABNORMAL LOW (ref 150–400)
RBC: 4.6 MIL/uL (ref 3.87–5.11)
RDW: 14.4 % (ref 11.5–15.5)
WBC: 33.3 10*3/uL — ABNORMAL HIGH (ref 4.0–10.5)

## 2014-11-13 LAB — BASIC METABOLIC PANEL
Anion gap: 7 (ref 5–15)
BUN: 7 mg/dL (ref 6–20)
CALCIUM: 8.2 mg/dL — AB (ref 8.9–10.3)
CO2: 29 mmol/L (ref 22–32)
Chloride: 94 mmol/L — ABNORMAL LOW (ref 101–111)
Creatinine, Ser: 0.54 mg/dL (ref 0.44–1.00)
GFR calc Af Amer: >60 mL/min (ref >60)
GLUCOSE: 101 mg/dL — AB (ref 65–99)
Potassium: 3.9 mmol/L (ref 3.5–5.1)
Sodium: 130 mmol/L — ABNORMAL LOW (ref 135–145)

## 2014-11-13 MED ORDER — PREDNISONE 5 MG PO TABS: 5.0000 mg | ORAL_TABLET | Freq: Every day | ORAL | Status: DC

## 2014-11-13 MED ORDER — PANTOPRAZOLE SODIUM 40 MG PO TBEC: 40.0000 mg | DELAYED_RELEASE_TABLET | Freq: Every day | ORAL | Status: DC

## 2014-11-13 MED ORDER — SACCHAROMYCES BOULARDII 250 MG PO CAPS: 250.0000 mg | ORAL_CAPSULE | Freq: Two times a day (BID) | ORAL | Status: DC

## 2014-11-13 MED ORDER — FLUCONAZOLE 100 MG PO TABS: 100.0000 mg | ORAL_TABLET | Freq: Every day | ORAL | Status: DC

## 2014-11-13 MED ORDER — GI COCKTAIL ~~LOC~~: 30.0000 mL | Freq: Three times a day (TID) | ORAL | Status: DC

## 2014-11-13 MED ORDER — CIPROFLOXACIN HCL 500 MG PO TABS: 500.0000 mg | ORAL_TABLET | Freq: Two times a day (BID) | ORAL | Status: DC

## 2014-11-13 NOTE — Progress Notes (Signed)
Patient d/c home with son. Patient is stable.No c/o pain.

## 2014-11-13 NOTE — Care Management Note (Signed)
Case Management Note  Patient Details  Name: Stefanie Henry MRN: [id number] Date of Birth: April 16, 1930                     Expected Discharge Date:                  Expected Discharge Plan:  [place]  In-House Referral:     Discharge planning Services  CM Consult  Post Acute Care Choice:    Choice offered to:     DME Arranged:    DME Agency:     HH Arranged:  PT [place]:  [place]  Status of Service:  In process, will continue to follow  Medicare Important Message Given:  Yes Date Medicare IM Given:  11/08/14 Medicare IM give by:  Marney Doctor RN,BSN,NCM Date Additional Medicare IM Given:  11/13/14 Additional Medicare Important Message give by:  Marney Doctor RN,BSN,NCM    Nazair Fortenberry, Marjie Skiff, RN 11/13/2014, 10:49 AM

## 2014-11-13 NOTE — Discharge Instructions (Signed)

## 2014-11-13 NOTE — Progress Notes (Signed)
Patient's son signed the d/c instructions,verbalized understanding.

## 2014-11-13 NOTE — Discharge Summary (Addendum)
Physician Discharge Summary  Stefanie Henry VOZ:[id number] DOB: 18-Nov-1929 DOA: 11/01/2014  PCP: [name], MD  Admit date: 11/01/2014 Discharge date: 11/13/2014  Recommendations for Outpatient Follow-up:  1. Pt will need to follow up with PCP in 2-3 weeks post discharge 2. Please obtain BMP to evaluate electrolytes and kidney function 3. Please also check CBC to evaluate Hg and Hct levels 4. Please note that pt has agreed to take one more day of Cipro upon discharge    Discharge Diagnoses:  Active Problems:   Chronic lymphocytic leukemia   Dyspnea   Diverticulitis   Abscess, abdomen   Hyponatremia   Diverticulitis of large intestine with perforation and abscess without bleeding   Thrombocytopenia   Dysphagia   Malnutrition of moderate degree   Diverticulitis of large intestine with abscess without bleeding   Early satiety   Gastric polyps   Abscess   Colovesical fistula  Discharge Condition: Stable  Diet recommendation: Heart healthy diet discussed in details   History of present illness:   81 y.o. female with CLL, diverticulitis, asthma, presented to the ED from home after being instructed by GI to present to the ED as she has persistent abscess and colovesical fistula.   Assessment/Plan:    Sigmoid diverticulitis with abscess - repeat CT as an outpatient showed new air in bladder ?colonovesical fistula - abscess is persistent, measuring 2.5 cm - general surgery consulted, recommending IV antibiotics, no need for intervention - EGD done 11/07/2014, notable for numerous flat white polyps in proximal stomach, biopsied and still awaiting for results - we are continuing Cipro IV day #13/14, Flagyl IV has been discontinued per pt's request as she said it makes her more sick when she takes it  - repeat CT abd 5/14 with resolving mild diverticulitis  - pt to take Cipro oral for one day post discharge which will complete 14 days therapy   Dysphagia / early satiety -  tolerating diet well and reports appetite much better with less flatulence  - continuing ciprofloxacin IV day #13/14 and will complete therapy at home for one more day  Hypokalemia - Potassium has been supplemented   CLL - with associated leukocytosis - Dr. Renne Crigler discussed with Dr. [name], pt was on long standing prednisone, will likely not need it anymore but he recommends a slow taper given prolonged expoure - continue prednisone at 5 mg daily   Hyponatremia - remains overall stable  Oral thrush - provided diflucan which helped and will be given upon discharge to complete 7 days total   Thrombocytopenia - discontinued Lovenox 5/8 and transitioned to SCD due to persistent drop in Plt  - no signs of active bleeding, Plt remain stable overall   Chronic Asthma - no wheezing, pt maintaining oxygen saturation at target range - will ask to have oxygen checked with ambulation and at rest to see if pt qualifies for home oxygen  - continue bronchodilators   Code Status: Full.  Family Communication: plan of care discussed with the patient and son  Disposition Plan: D/C home   IV access:  Peripheral IV  Procedures and diagnostic studies:   Ct Abdomen Pelvis Wo Contrast 10/31/2014 Residual changes of sigmoid diverticulitis and associated abscess. New air in the bladder raises suspicion for a colonovesical fistula, in the absence of recent bladder catheterization.   Ct Abdomen Pelvis Wo Contrast 10/15/2014 Colonic diverticulosis with evidence of mild inflammatory changes about the sigmoid colon most likely reflecting diverticulitis with a likely 2.4 cm contained perforation/small abscess  between the sigmoid colon and bladder.   Dg Chest 1 View 10/16/2014 Mildly increased central pulmonary vascular congestion is noted with possible minimal bilateral perihilar edema.   Mr 3d Recon At Scanner 10/19/2014 Cholelithiasis without evidence of acute cholecystitis, and without  evidence of choledocholithiasis or biliary tract obstruction. 2. Although the common bile duct is mildly dilated 8 mm in the porta hepatis, this is within normal limits for a patient this age, and there is no associated intrahepatic biliary ductal dilatation. 3. Extensive iron deposition throughout the liver and spleen, as above. 4. Additional incidental findings, as above.   Mr Abd W/wo Cm/mrcp 10/19/2014 Cholelithiasis without evidence of acute cholecystitis, and without evidence of choledocholithiasis or biliary tract obstruction. 2. Although the common bile duct is mildly dilated 8 mm in the porta hepatis, this is within normal limits for a patient this age, and there is no associated intrahepatic biliary ductal dilatation. 3. Extensive iron deposition throughout the liver and spleen, as above. 4. Additional incidental findings, as above.   Ct Abdomen Pelvis Wo Contrast 11/10/2014 Small fluid collection positioned between the left aspect of the bladder and sigmoid colon is decreased slightly in volume compared to prior. Demonstration of connection with the sigmoid colon may be evaluated with rectal contrast in the future or a barium enema. 2. Small amount a gas within the bladder is not changed. 3. Sigmoid thickening consistent with resolving mild diverticulitis   Medical Consultants:  GI  Surgery   Other Consultants:  None  IAnti-Infectives:   Cipro 5/5 --> Flagyl 5/5 --> 5/13      Discharge Exam: Filed Vitals:   11/13/14 0645  BP: 124/63  Pulse: 81  Temp: 98.1 F (36.7 C)  Resp: 16   Filed Vitals:   11/12/14 0441 11/12/14 1437 11/12/14 2235 11/13/14 0645  BP: 113/60 121/63 100/70 124/63  Pulse: 70 92 72 81  Temp: 97.4 F (36.3 C) 98.2 F (36.8 C) 97.3 F (36.3 C) 98.1 F (36.7 C)  TempSrc: Oral Oral Oral Oral  Resp: 16 16 16 16   Height:      Weight:      SpO2: 99% 100% 100% 98%    General: Pt is alert, follows commands appropriately, not in acute  distress Cardiovascular: Regular rate and rhythm, no rubs, no gallops Respiratory: Clear to auscultation bilaterally, no wheezing, no crackles, no rhonchi Abdominal: Soft, non tender, non distended, bowel sounds +, no guarding Extremities: LE +1 edema, no cyanosis, pulses palpable bilaterally DP and PT Neuro: Grossly nonfocal  Discharge Instructions:    Medication List    STOP taking these medications        metroNIDAZOLE 500 MG tablet  Commonly known as:  FLAGYL      TAKE these medications        albuterol 108 (90 BASE) MCG/ACT inhaler  Commonly known as:  PROVENTIL HFA;VENTOLIN HFA  Inhale 1-2 puffs into the lungs every 6 (six) hours as needed for wheezing or shortness of breath.     albuterol (2.5 MG/3ML) 0.083% nebulizer solution  Commonly known as:  PROVENTIL  Take 2.5 mg by nebulization every 6 (six) hours as needed for wheezing or shortness of breath.     calcitonin (salmon) 200 UNIT/ACT nasal spray  Commonly known as:  MIACALCIN/FORTICAL  Place 1 spray into alternate nostrils daily.     ciprofloxacin 500 MG tablet  Commonly known as:  CIPRO  Take 1 tablet (500 mg total) by mouth 2 (two) times daily.  famotidine 20 MG tablet  Commonly known as:  PEPCID  Take 1 tablet (20 mg total) by mouth daily.     fluconazole 100 MG tablet  Commonly known as:  DIFLUCAN  Take 1 tablet (100 mg total) by mouth daily.     gabapentin 100 MG capsule  Commonly known as:  NEURONTIN  Take 1 capsule (100 mg total) by mouth daily.     GAS-X PO  Take 2 capsules by mouth daily.     gi cocktail Susp suspension  Take 30 mLs by mouth 3 (three) times daily. Each 30 mL contains Maalox 12 mL, viscous lidocaine 12 mL, Donnatal 6 mL     omeprazole 20 MG capsule  Commonly known as:  PRILOSEC  Take 20 mg by mouth daily.     pantoprazole 40 MG tablet  Commonly known as:  PROTONIX  Take 1 tablet (40 mg total) by mouth daily.     predniSONE 5 MG tablet  Commonly known as:  DELTASONE   Take 1 tablet (5 mg total) by mouth daily with breakfast.     saccharomyces boulardii 250 MG capsule  Commonly known as:  FLORASTOR  Take 1 capsule (250 mg total) by mouth 2 (two) times daily.     senna 8.6 MG Tabs tablet  Commonly known as:  SENOKOT  Take 1-2 tablets by mouth daily as needed for mild constipation.           Follow-up Information    Follow up with Hvozdovic, Vita Barley, PA-C On 11/23/2014.   Specialty:  Physician Assistant   Why:  appt at 9:45--please be there for 9:30   Contact information:   [place] [id number] [phone]       Schedule an appointment as soon as possible for a visit with [name], MD.   Specialty:  Oncology   Contact information:   [place], [place] [address] [phone]       Call Faye Ramsay, MD.   Specialty:  Internal Medicine   Why:  As needed call my cell phone [phone]   Contact information:   [address] [place] [address] [phone]        The results of significant diagnostics from this hospitalization (including imaging, microbiology, ancillary and laboratory) are listed below for reference.     Microbiology: No results found for this or any previous visit (from the past 240 hour(s)).   Labs: Basic Metabolic Panel:  Recent Labs Lab 11/08/14 0805 11/08/14 0830 11/09/14 0407 11/11/14 0432 11/12/14 0435 11/13/14 0421  NA 134*  --  136 131* 131* 130*  K 3.1*  --  4.2 3.5 3.4* 3.9  CL 100*  --  103 97* 96* 94*  CO2 26  --  26 26 27 29   GLUCOSE 114*  --  106* 108* 105* 101*  BUN <5*  --  <5* 6 6 7   CREATININE 0.56  --  0.61 0.54 0.53 0.54  CALCIUM 8.3*  --  8.3* 8.2* 8.0* 8.2*  MG  --  1.9  --   --   --   --    Liver Function Tests: No results for input(s): AST, ALT, ALKPHOS, BILITOT, PROT, ALBUMIN in the last 168 hours. No results for input(s): LIPASE, AMYLASE in the last 168 hours. No results for input(s): AMMONIA in  the last 168 hours. CBC:  Recent Labs Lab 11/07/14 0410 11/09/14 0407 11/11/14 0432 11/12/14 0435 11/13/14  0421  WBC 20.8* 21.9* 25.9* 28.6* 33.3*  HGB 10.0* 10.1* 10.2* 10.4* 10.5*  HCT 30.7* 31.5* 33.2* 32.0* 32.8*  MCV 71.2* 71.6* 71.4* 70.5* 71.3*  PLT 70* 76* 74* 73* 79*    ProBNP (last 3 results)  Recent Labs  10/05/14 1142  PROBNP 81.0    SIGNED: Time coordinating discharge: minutes  Faye Ramsay, MD  [facility] 11/13/2014, 10:04 AM Pager [phone]  If 7PM-7AM, please contact night-coverage [email or web address] Password [email or web address]

## 2014-11-13 NOTE — Care Management Note (Signed)
Case Management Note  Patient Details  Name: Stefanie Henry MRN: [id number] Date of Birth: Mar 03, 1930  Medicare Important Message Given:  Yes Date Medicare IM Given:  11/08/14 Medicare IM give by:  Marney Doctor RN,BSN,NCM Date Additional Medicare IM Given:  11/13/14 Additional Medicare Important Message give by:  Marney Doctor RN,BSN,NCM  If discussed at Long Length of Stay Meetings, dates discussed:    Additional Comments: Pt with MD order for home 02. Per [facility] DME rep pt used [employer] [facility] [phone]) in 2012 for home 02 and will need to use them again. (medicare will only bill one [place] per 5 years). [employer] [facility] contacted to give referral. Order, desaturation screen, demographics and progress notes faxed to office [phone]). This CM called son Amil to inform him of above information. [employer] [facility] will deliver portable 02 tank between 5:30-6pm this evening for pt to DC home with. Son and floor nurse aware. [facility] to provide [facility] services as ordered. No other DC needs noted. Lynnell Catalan, RN 11/13/2014, 4:26 PM

## 2014-11-13 NOTE — Progress Notes (Signed)
SATURATION QUALIFICATIONS: (This note is used to comply with regulatory documentation for home oxygen)  Patient Saturations on Room Air at Rest = 94%  Patient Saturations on Room Air while Ambulating = 87%  Patient Saturations on 2 Liters of oxygen while Ambulating = 98%  Please briefly explain why patient needs home oxygen: Pt o2 sats drop to 87% on RA  and pt short of breath after exertion of ambulation.

## 2014-11-15 DIAGNOSIS — J45901 Unspecified asthma with (acute) exacerbation: Secondary | ICD-10-CM | POA: Diagnosis not present

## 2014-11-15 DIAGNOSIS — K5720 Diverticulitis of large intestine with perforation and abscess without bleeding: Secondary | ICD-10-CM | POA: Diagnosis not present

## 2014-11-15 DIAGNOSIS — R1310 Dysphagia, unspecified: Secondary | ICD-10-CM | POA: Diagnosis not present

## 2014-11-15 DIAGNOSIS — C9110 Chronic lymphocytic leukemia of B-cell type not having achieved remission: Secondary | ICD-10-CM | POA: Diagnosis not present

## 2014-11-16 DIAGNOSIS — J45901 Unspecified asthma with (acute) exacerbation: Secondary | ICD-10-CM | POA: Diagnosis not present

## 2014-11-16 DIAGNOSIS — K5720 Diverticulitis of large intestine with perforation and abscess without bleeding: Secondary | ICD-10-CM | POA: Diagnosis not present

## 2014-11-16 DIAGNOSIS — C9110 Chronic lymphocytic leukemia of B-cell type not having achieved remission: Secondary | ICD-10-CM | POA: Diagnosis not present

## 2014-11-16 DIAGNOSIS — R1310 Dysphagia, unspecified: Secondary | ICD-10-CM | POA: Diagnosis not present

## 2014-11-19 DIAGNOSIS — C9110 Chronic lymphocytic leukemia of B-cell type not having achieved remission: Secondary | ICD-10-CM | POA: Diagnosis not present

## 2014-11-19 DIAGNOSIS — R1310 Dysphagia, unspecified: Secondary | ICD-10-CM | POA: Diagnosis not present

## 2014-11-19 DIAGNOSIS — K5720 Diverticulitis of large intestine with perforation and abscess without bleeding: Secondary | ICD-10-CM | POA: Diagnosis not present

## 2014-11-19 DIAGNOSIS — J45901 Unspecified asthma with (acute) exacerbation: Secondary | ICD-10-CM | POA: Diagnosis not present

## 2014-11-20 DIAGNOSIS — C9110 Chronic lymphocytic leukemia of B-cell type not having achieved remission: Secondary | ICD-10-CM | POA: Diagnosis not present

## 2014-11-20 DIAGNOSIS — K5720 Diverticulitis of large intestine with perforation and abscess without bleeding: Secondary | ICD-10-CM | POA: Diagnosis not present

## 2014-11-20 DIAGNOSIS — R1310 Dysphagia, unspecified: Secondary | ICD-10-CM | POA: Diagnosis not present

## 2014-11-20 DIAGNOSIS — J45901 Unspecified asthma with (acute) exacerbation: Secondary | ICD-10-CM | POA: Diagnosis not present

## 2014-11-21 DIAGNOSIS — C9110 Chronic lymphocytic leukemia of B-cell type not having achieved remission: Secondary | ICD-10-CM | POA: Diagnosis not present

## 2014-11-21 DIAGNOSIS — J45901 Unspecified asthma with (acute) exacerbation: Secondary | ICD-10-CM | POA: Diagnosis not present

## 2014-11-21 DIAGNOSIS — R1310 Dysphagia, unspecified: Secondary | ICD-10-CM | POA: Diagnosis not present

## 2014-11-21 DIAGNOSIS — K5720 Diverticulitis of large intestine with perforation and abscess without bleeding: Secondary | ICD-10-CM | POA: Diagnosis not present

## 2014-11-21 MED ORDER — SUCRALFATE 1 G PO TABS: 1.0000 g | ORAL_TABLET | Freq: Two times a day (BID) | ORAL | Status: DC

## 2014-11-23 ENCOUNTER — Telehealth: Payer: Self-pay

## 2014-11-23 ENCOUNTER — Ambulatory Visit (INDEPENDENT_AMBULATORY_CARE_PROVIDER_SITE_OTHER): Payer: Medicare Other | Admitting: Physician Assistant

## 2014-11-23 ENCOUNTER — Other Ambulatory Visit (INDEPENDENT_AMBULATORY_CARE_PROVIDER_SITE_OTHER): Payer: Medicare Other

## 2014-11-23 ENCOUNTER — Encounter: Payer: Self-pay | Admitting: Physician Assistant

## 2014-11-23 VITALS — BP 100/60 | HR 60 | Temp 98.3°F | Ht <= 58 in | Wt 122.2 lb

## 2014-11-23 DIAGNOSIS — K5732 Diverticulitis of large intestine without perforation or abscess without bleeding: Secondary | ICD-10-CM

## 2014-11-23 DIAGNOSIS — R1013 Epigastric pain: Secondary | ICD-10-CM

## 2014-11-23 DIAGNOSIS — R1310 Dysphagia, unspecified: Secondary | ICD-10-CM | POA: Diagnosis not present

## 2014-11-23 LAB — COMPREHENSIVE METABOLIC PANEL
ALBUMIN: 3.9 g/dL (ref 3.5–5.2)
ALK PHOS: 65 U/L (ref 39–117)
ALT: 8 U/L (ref 0–35)
AST: 12 U/L (ref 0–37)
BUN: 7 mg/dL (ref 6–23)
CALCIUM: 9 mg/dL (ref 8.4–10.5)
CHLORIDE: 93 meq/L — AB (ref 96–112)
CO2: 31 mEq/L (ref 19–32)
Creatinine, Ser: 0.57 mg/dL (ref 0.40–1.20)
GFR: 107.57 mL/min (ref >60.00)
Glucose, Bld: 121 mg/dL — ABNORMAL HIGH (ref 70–99)
Potassium: 5 mEq/L (ref 3.5–5.1)
Sodium: 127 mEq/L — ABNORMAL LOW (ref 135–145)
Total Bilirubin: 0.3 mg/dL (ref 0.2–1.2)
Total Protein: 5.6 g/dL — ABNORMAL LOW (ref 6.0–8.3)

## 2014-11-23 LAB — CBC WITH DIFFERENTIAL/PLATELET
BASOS ABS: 1.3 10*3/uL — AB (ref 0.0–0.1)
Basophils Relative: 0.5 % (ref 0.0–3.0)
Eosinophils Absolute: 0.2 10*3/uL (ref 0.0–0.7)
Eosinophils Relative: 0.3 % (ref 0.0–5.0)
HCT: 36.7 % (ref 36.0–46.0)
HEMOGLOBIN: 11.5 g/dL — AB (ref 12.0–15.0)
Lymphocytes Relative: 84.4 % — ABNORMAL HIGH (ref 12.0–46.0)
Lymphs Abs: 40.7 10*3/uL — ABNORMAL HIGH (ref 0.7–4.0)
MCHC: 31.3 g/dL (ref 30.0–36.0)
MCV: 72.1 fl — AB (ref 78.0–100.0)
MONO ABS: 1.1 10*3/uL — AB (ref 0.1–1.0)
Monocytes Relative: 2.4 % — ABNORMAL LOW (ref 3.0–12.0)
NEUTROS ABS: 6 10*3/uL (ref 1.4–7.7)
NEUTROS PCT: 12.4 % — AB (ref 43.0–77.0)
NRBC: 0 /100{WBCs} (ref 0–4)
Platelets: 134 10*3/uL — ABNORMAL LOW (ref 150.0–400.0)
RBC: 5.09 Mil/uL (ref 3.87–5.11)
RDW: 13.7 % (ref 11.5–15.5)
WBC: 47.9 10*3/uL (ref 4.0–10.5)

## 2014-11-23 MED ORDER — SUCRALFATE 1 GM/10ML PO SUSP: ORAL | Status: DC

## 2014-11-23 MED ORDER — HYOSCYAMINE SULFATE 0.125 MG SL SUBL: 0.1250 mg | SUBLINGUAL_TABLET | Freq: Three times a day (TID) | SUBLINGUAL | Status: DC | PRN

## 2014-11-23 NOTE — Telephone Encounter (Signed)
Called [place] to schedule patient with PCP.  Patient is scheduled 12/04/2014  at 1030am.  Patient son informed of appointment.

## 2014-11-23 NOTE — Telephone Encounter (Signed)
Prior authorization for carafate and levsin done via cover my meds.

## 2014-11-23 NOTE — Progress Notes (Signed)
Patient ID: Stefanie Henry, female   DOB: Apr 28, 1930, 81 y.o.   MRN: [id number]     History of Present Illness: Stefanie Henry who was recently admitted to [facility] from 11/01/2014 through 11/13/2014. She was noted to have sigmoid diverticulitis with abscess. General surgery was consult it and recommended IV and a biotics. Patient was to follow up with [facility] surgery after discharge and apparently had an appointment however the patient's husband states they didn't go because the patient was tired. While in the hospital she also complained of dysphagia and early satiety she had an EGD with some flat polyps. She also has a history of CLL and was instructed to continue prednisone 5 mg daily. Her husband states she has not been able to follow-up with her primary care provider as her previous PCP at the [place] [facility] in [facility] retired. They are requesting a primary care provider in [facility]. Patient continues to feel poorly with very little appetite. She complains of abdominal bloating. She denies abdominal pain, nausea, vomiting, fever, or chills. She has not filled her prescription for Carafate. Her Donnatal was not covered by her insurance. She has stopped taking her Protonix because she doesn't feel it does anything for her   Past Medical History  Diagnosis Date  . Asthma   . Arthritis   . Shingles   . Anemia, iron deficiency 06/06/2012  . Chronic lymphatic leukemia   . Gallstones   . Diverticulitis     Past Surgical History  Procedure Laterality Date  . None    . Esophagogastroduodenoscopy N/A 11/07/2014    Procedure: ESOPHAGOGASTRODUODENOSCOPY (EGD);  Surgeon: Gatha Mayer, MD;  Location: [facility];  Service: Endoscopy;  Laterality: N/A;   Family History  Problem Relation Age of Onset  . Diabetes Father   . Stomach cancer Sister   . Colon cancer Neg Hx   . Colon polyps Neg Hx   . Esophageal cancer Neg Hx   . Gallbladder disease Mother    History    Substance Use Topics  . Smoking status: Never Smoker   . Smokeless tobacco: Never Used     Comment: PT NEVER SMOKED  . Alcohol Use: No   Current Outpatient Prescriptions  Medication Sig Dispense Refill  . albuterol (PROVENTIL HFA;VENTOLIN HFA) 108 (90 BASE) MCG/ACT inhaler Inhale 1-2 puffs into the lungs every 6 (six) hours as needed for wheezing or shortness of breath.    Marland Kitchen albuterol (PROVENTIL) (2.5 MG/3ML) 0.083% nebulizer solution Take 2.5 mg by nebulization every 6 (six) hours as needed for wheezing or shortness of breath.    . Alum & Mag Hydroxide-Simeth (GI COCKTAIL) SUSP suspension Take 30 mLs by mouth 3 (three) times daily. Each 30 mL contains Maalox 12 mL, viscous lidocaine 12 mL, Donnatal 6 mL 500 mL 3  . calcitonin, salmon, (MIACALCIN/FORTICAL) 200 UNIT/ACT nasal spray Place 1 spray into alternate nostrils daily. 3.7 mL 12  . famotidine (PEPCID) 20 MG tablet Take 1 tablet (20 mg total) by mouth daily. (Patient taking differently: Take 20 mg by mouth daily as needed for heartburn or indigestion. ) 30 tablet 1  . fluconazole (DIFLUCAN) 100 MG tablet Take 1 tablet (100 mg total) by mouth daily. 7 tablet 0  . gabapentin (NEURONTIN) 100 MG capsule Take 1 capsule (100 mg total) by mouth daily. (Patient taking differently: Take 100 mg by mouth daily as needed (for nerve pain). ) 30 capsule 1  . pantoprazole (PROTONIX) 40 MG tablet Take 1 tablet (  40 mg total) by mouth daily. 30 tablet 2  . predniSONE (DELTASONE) 5 MG tablet Take 1 tablet (5 mg total) by mouth daily with breakfast. 30 tablet 0  . saccharomyces boulardii (FLORASTOR) 250 MG capsule Take 1 capsule (250 mg total) by mouth 2 (two) times daily. 60 capsule 1  . senna (SENOKOT) 8.6 MG TABS tablet Take 1-2 tablets by mouth daily as needed for mild constipation.    . Simethicone (GAS-X PO) Take 2 capsules by mouth daily.     . hyoscyamine (LEVSIN SL) 0.125 MG SL tablet Place 1 tablet (0.125 mg total) under the tongue every 8 (eight)  hours as needed. 60 tablet 1  . sucralfate (CARAFATE) 1 GM/10ML suspension Take 2 tsp in the morning and at bedtime 420 mL 1   No current facility-administered medications for this visit.   Allergies  Allergen Reactions  . Contrast Media [Iodinated Diagnostic Agents] Other (See Comments)    Stomach discomfort  . Fruit & Vegetable Daily [Nutritional Supplements] Other (See Comments)    Citrus fruit causes stomach discomfort  . Ibuprofen Other (See Comments)    Stomach discomfort      Review of Systems: Per history of present illness otherwise negative    Studies:   Ct Abdomen Pelvis Wo Contrast  11/10/2014   CLINICAL DATA:  Elevated white blood cell count. History of abdominal abscess and colovesicular fistula.  EXAM: CT ABDOMEN AND PELVIS WITHOUT CONTRAST  TECHNIQUE: Multidetector CT imaging of the abdomen and pelvis was performed following the standard protocol without IV contrast.  COMPARISON:  CT 10/31/2014  FINDINGS: Lower chest: Lung bases are clear.  Hepatobiliary: No focal hepatic lesion. No biliary duct dilatation. Gallbladder is normal. Common bile duct is normal.  Pancreas: Pancreas is normal. No ductal dilatation. No pancreatic inflammation.  Spleen: Spleen is enlarged measuring 13 cm in craniocaudad dimension and 11 x 8 cm in axial dimension.  Adrenals/urinary tract: Adrenal glands are normal. Kidneys, ureters normal.  There is again demonstrated moderate gas collecting non dependently within the bladder. On the left superior aspect of the bladder there is a ill-defined fluid collection measuring 16 mm x 30 mm in axial dimension image 56, series 2. This small fluid collection has a central gas bubble and some high attenuation material. This small fluid collection is adjacent to the sigmoid colon seen on sagittal image 103 and may communicate with the sigmoid colon. The oral contrast administered on current study does not reach this portion of the large bowel and is most dense  within the small bowel. This small fluid collection positioned between the left dome of the bladder and in the sigmoid colon is mildly decreased in size from 31 x 25 mm on comparison exam of 10/31/2014. There are multiple diverticula of the sigmoid colon this region.  Stomach/Bowel: The stomach and small bowel are normal. There are multiple diverticula through the sigmoid colon. There is thickening through the distal sigmoid colon. There is a small fluid collection inferior to the proximal sigmoid colon described in the GU section above.  Vascular/Lymphatic: Abdominal aorta is normal caliber. There is no retroperitoneal or periportal lymphadenopathy. No pelvic lymphadenopathy.  Reproductive: Calcification within the uterus consistent with a fibroid. No adnexal abnormality.  Musculoskeletal: No aggressive osseous lesion.  Other: No free fluid.  IMPRESSION: 1. Small fluid collection positioned between the left aspect of the bladder and sigmoid colon is decreased slightly in volume compared to prior. Demonstration of connection with the sigmoid colon may be evaluated  with rectal contrast in the future or a barium enema. 2. Small amount a gas within the bladder is not changed. 3. Sigmoid thickening consistent with resolving mild diverticulitis. 4. Splenomegaly.   Electronically Signed   By: Suzy Bouchard M.D.   On: 11/10/2014 15:31   Ct Abdomen Pelvis Wo Contrast  10/31/2014   CLINICAL DATA:  Recent hospital admission for diverticulitis and abscess. Nausea and vomiting for 1 week, chronic constipation, weight loss.  EXAM: CT ABDOMEN AND PELVIS WITHOUT CONTRAST  TECHNIQUE: Multidetector CT imaging of the abdomen and pelvis was performed following the standard protocol without IV contrast.  COMPARISON:  MR abdomen 10/19/2014 and CT abdomen 10/15/2014.  FINDINGS: Lower chest: Lung bases show mild volume loss in the right middle lobe and lingula. Minimal scarring in the medial aspects of the lower lobes. Heart is at  the upper limits of normal in size to mildly enlarged. No pericardial or pleural effusion.  Hepatobiliary: Liver and gallbladder are unremarkable. No biliary ductal dilatation.  Pancreas: Negative.  Spleen: 14.2 x 4.4 x 11.5 cm, upper normal in size.  Adrenals/Urinary Tract: Adrenal glands are unremarkable. Exophytic low-attenuation lesion off the right kidney measures 12 mm, shown to represent a cyst on 10/19/2014. Kidneys are otherwise unremarkable. No urinary stones. Ureters are decompressed. Small amount of air is seen within the bladder, new.  Stomach/Bowel: Stomach, small bowel and majority of the colon are unremarkable. There is residual haziness about the proximal sigmoid colon with a persistent extraluminal collection of fluid and air between the sigmoid colon and bladder, measuring 2.5 cm (series 2, image 60). Small amount of air is seen within the bladder, raising suspicion for a fistula if there is no recent bladder catheterization.  Vascular/Lymphatic: Atherosclerotic calcification of the arterial vasculature without abdominal aortic aneurysm. Scattered lymph nodes are not enlarged by CT size criteria.  Reproductive: Calcified lesions in the uterus are likely fibroids. Ovaries are visualized.  Other: No free fluid. Mesenteries and peritoneum are unremarkable. Tiny periumbilical hernia contains fat.  Musculoskeletal: No worrisome lytic or sclerotic lesions. Degenerative changes are seen in the spine and hips. L2 compression fracture, as on 10/15/2014. Advanced degenerative disc disease at L5-S1.  IMPRESSION: Residual changes of sigmoid diverticulitis and associated abscess. New air in the bladder raises suspicion for a colonovesical fistula, in the absence of recent bladder catheterization.   Electronically Signed   By: Lorin Picket M.D.   On: 10/31/2014 08:17     Physical Exam: General: Pleasant, elderly female in no acute distress in a wheelchair Head: Normocephalic and atraumatic Eyes:   sclerae anicteric, conjunctiva pink  Ears: Normal auditory acuity Lungs: Clear throughout to auscultation Heart: Regular rate and rhythm Abdomen: Soft, non distended, non-tender. No masses, no hepatomegaly. Normal bowel sounds Musculoskeletal: Symmetrical with no gross deformities  Extremities: No edema  Neurological: Alert oriented x 4, grossly nonfocal Psychological:  Alert and cooperative. Normal mood and affect  Assessment and Recommendations: 81 year old female with a history of diverticulitis with abscess formation who completed IV and a biotics in the hospital followed by a course of oral and a biotics here for follow-up. She continues to feel poorly. Unfortunately, she missed her follow-up with Dr. Redmond Pulling and this will be rescheduled for her. She can then be sent back to GI to have a colonoscopy if surgery is planned. Patient's husband does not feel she would be able to tolerate a colonoscopy as an outpatient and requests that she be prepped as an inpatient if she needs colonoscopy.  Patient's husband says it is too difficult for them to get her to [facility] for primary care appointments and they are requesting that she be set up for an appointment with a primary care provider at [facility].. In the meantime she will continue Protonix. She may discontinue famotidine. She will use Carafate suspension 2 teaspoons before meals and at bedtime. She will try Levsin 0.125 mg 1 by mouth every 8 hours as needed for cramps. ABC and comprehensive metabolic panel will be obtained. She will follow up here 2 weeks after seen by [facility] surgery.        Kyri Shader, Deloris Ping 11/23/2014,

## 2014-11-23 NOTE — Patient Instructions (Addendum)
You have an appointment at [facility] [phone], on 12/20/2014 at 1015 am.  We have sent medications to your pharmacy for you to pick up at your convenience.  Stop taking pepcid.  We will contact you with an appointment for a primary care provider.

## 2014-11-24 NOTE — Progress Notes (Signed)
Reviewed and agree with management plan.  Zamirah Denny T. Babara Buffalo, MD FACG 

## 2014-11-27 DIAGNOSIS — R1310 Dysphagia, unspecified: Secondary | ICD-10-CM | POA: Diagnosis not present

## 2014-11-27 DIAGNOSIS — K5720 Diverticulitis of large intestine with perforation and abscess without bleeding: Secondary | ICD-10-CM | POA: Diagnosis not present

## 2014-11-27 DIAGNOSIS — J45901 Unspecified asthma with (acute) exacerbation: Secondary | ICD-10-CM | POA: Diagnosis not present

## 2014-11-27 DIAGNOSIS — C9110 Chronic lymphocytic leukemia of B-cell type not having achieved remission: Secondary | ICD-10-CM | POA: Diagnosis not present

## 2014-11-28 ENCOUNTER — Telehealth: Payer: Self-pay | Admitting: Physician Assistant

## 2014-11-28 DIAGNOSIS — R1310 Dysphagia, unspecified: Secondary | ICD-10-CM | POA: Diagnosis not present

## 2014-11-28 DIAGNOSIS — K5720 Diverticulitis of large intestine with perforation and abscess without bleeding: Secondary | ICD-10-CM | POA: Diagnosis not present

## 2014-11-28 DIAGNOSIS — J45901 Unspecified asthma with (acute) exacerbation: Secondary | ICD-10-CM | POA: Diagnosis not present

## 2014-11-28 DIAGNOSIS — C9110 Chronic lymphocytic leukemia of B-cell type not having achieved remission: Secondary | ICD-10-CM | POA: Diagnosis not present

## 2014-11-29 MED ORDER — SUCRALFATE 1 G PO TABS: ORAL_TABLET | ORAL | Status: DC

## 2014-11-29 MED ORDER — DICYCLOMINE HCL 10 MG PO CAPS: ORAL_CAPSULE | ORAL | Status: DC

## 2014-11-29 NOTE — Telephone Encounter (Signed)
Pill form of Carafate sent to pharmacy.

## 2014-11-29 NOTE — Telephone Encounter (Signed)
Spoke with Margaretha Sheffield at [facility] and told her patient's PCP would need to manage blood pressure and edema. She will work on this. She states patient cannot afford the Hyoscyamine or Carafate. Please, advise.

## 2014-11-29 NOTE — Telephone Encounter (Signed)
If that is what her insurance will cover.

## 2014-11-29 NOTE — Telephone Encounter (Signed)
Can she try bentyl 10 mg bid prn and sucralfate with same directions as carafate?

## 2014-11-29 NOTE — Telephone Encounter (Signed)
Do you want to try the pills for patient?

## 2014-11-30 DIAGNOSIS — R1310 Dysphagia, unspecified: Secondary | ICD-10-CM | POA: Diagnosis not present

## 2014-11-30 DIAGNOSIS — C9110 Chronic lymphocytic leukemia of B-cell type not having achieved remission: Secondary | ICD-10-CM | POA: Diagnosis not present

## 2014-11-30 DIAGNOSIS — K5720 Diverticulitis of large intestine with perforation and abscess without bleeding: Secondary | ICD-10-CM | POA: Diagnosis not present

## 2014-11-30 DIAGNOSIS — J45901 Unspecified asthma with (acute) exacerbation: Secondary | ICD-10-CM | POA: Diagnosis not present

## 2014-12-01 DIAGNOSIS — K5720 Diverticulitis of large intestine with perforation and abscess without bleeding: Secondary | ICD-10-CM | POA: Diagnosis not present

## 2014-12-01 DIAGNOSIS — J45901 Unspecified asthma with (acute) exacerbation: Secondary | ICD-10-CM | POA: Diagnosis not present

## 2014-12-01 DIAGNOSIS — C9110 Chronic lymphocytic leukemia of B-cell type not having achieved remission: Secondary | ICD-10-CM | POA: Diagnosis not present

## 2014-12-01 DIAGNOSIS — R1310 Dysphagia, unspecified: Secondary | ICD-10-CM | POA: Diagnosis not present

## 2014-12-03 DIAGNOSIS — R1310 Dysphagia, unspecified: Secondary | ICD-10-CM | POA: Diagnosis not present

## 2014-12-03 DIAGNOSIS — J45901 Unspecified asthma with (acute) exacerbation: Secondary | ICD-10-CM | POA: Diagnosis not present

## 2014-12-03 DIAGNOSIS — K5720 Diverticulitis of large intestine with perforation and abscess without bleeding: Secondary | ICD-10-CM | POA: Diagnosis not present

## 2014-12-03 DIAGNOSIS — C9110 Chronic lymphocytic leukemia of B-cell type not having achieved remission: Secondary | ICD-10-CM | POA: Diagnosis not present

## 2014-12-04 ENCOUNTER — Telehealth: Payer: Self-pay | Admitting: *Deleted

## 2014-12-04 ENCOUNTER — Ambulatory Visit (INDEPENDENT_AMBULATORY_CARE_PROVIDER_SITE_OTHER): Payer: Medicare Other | Admitting: Medical

## 2014-12-04 ENCOUNTER — Encounter: Payer: Self-pay | Admitting: Medical

## 2014-12-04 VITALS — BP 103/50 | HR 80 | Temp 98.5°F | Ht <= 58 in | Wt 122.8 lb

## 2014-12-04 DIAGNOSIS — K5732 Diverticulitis of large intestine without perforation or abscess without bleeding: Secondary | ICD-10-CM | POA: Diagnosis not present

## 2014-12-04 DIAGNOSIS — C9110 Chronic lymphocytic leukemia of B-cell type not having achieved remission: Secondary | ICD-10-CM

## 2014-12-04 HISTORY — DX: Diverticulitis of large intestine without perforation or abscess without bleeding: K57.32

## 2014-12-04 LAB — COMPREHENSIVE METABOLIC PANEL
ALT: 8 U/L (ref 0–35)
AST: 12 U/L (ref 0–37)
Albumin: 3.9 g/dL (ref 3.5–5.2)
Alkaline Phosphatase: 59 U/L (ref 39–117)
BUN: 6 mg/dL (ref 6–23)
CO2: 28 mEq/L (ref 19–32)
CREATININE: 0.55 mg/dL (ref 0.40–1.20)
Calcium: 8.4 mg/dL (ref 8.4–10.5)
Chloride: 96 mEq/L (ref 96–112)
GFR: 112.09 mL/min (ref >60.00)
Glucose, Bld: 110 mg/dL — ABNORMAL HIGH (ref 70–99)
POTASSIUM: 3.5 meq/L (ref 3.5–5.1)
SODIUM: 130 meq/L — AB (ref 135–145)
Total Bilirubin: 0.4 mg/dL (ref 0.2–1.2)
Total Protein: 5.5 g/dL — ABNORMAL LOW (ref 6.0–8.3)

## 2014-12-04 LAB — CBC WITH DIFFERENTIAL/PLATELET
Basophils Absolute: 0 10*3/uL (ref 0.0–0.1)
Basophils Relative: 0 % (ref 0.0–3.0)
EOS ABS: 0.1 10*3/uL (ref 0.0–0.7)
Eosinophils Relative: 0.3 % (ref 0.0–5.0)
HEMATOCRIT: 34.5 % — AB (ref 36.0–46.0)
Hemoglobin: 10.9 g/dL — ABNORMAL LOW (ref 12.0–15.0)
Lymphs Abs: 28.5 10*3/uL — ABNORMAL HIGH (ref 0.7–4.0)
MCHC: 31.7 g/dL (ref 30.0–36.0)
MCV: 72.2 fl — ABNORMAL LOW (ref 78.0–100.0)
MONO ABS: 0.6 10*3/uL (ref 0.1–1.0)
Monocytes Relative: 1.8 % — ABNORMAL LOW (ref 3.0–12.0)
Neutro Abs: 4.8 10*3/uL (ref 1.4–7.7)
Neutrophils Relative %: 14 % — ABNORMAL LOW (ref 43.0–77.0)
PLATELETS: 111 10*3/uL — AB (ref 150.0–400.0)
RBC: 4.78 Mil/uL (ref 3.87–5.11)
RDW: 14 % (ref 11.5–15.5)
WBC: 33.9 10*3/uL (ref 4.0–10.5)

## 2014-12-04 NOTE — Assessment & Plan Note (Deleted)
Recent course and received IV antibiotics. Pt seemed symptomaticaly controlled but baseline poor appetite. WBC recent elevation. Unclear if from cll or underlying chronic diverticultiis. She also missed her appointment with surgeon.  Will try to get her quicker appoitnment.

## 2014-12-04 NOTE — Telephone Encounter (Signed)
Patient's WBC was 33.9.

## 2014-12-04 NOTE — Progress Notes (Signed)
Subjective:    Patient ID: Stefanie Henry, female    DOB: 03-27-1930, 81 y.o.   MRN: [id number]  HPI  I have reviewed pt PMH, PSH, FH, Social History and Surgical History  Pt has hx of CLL. Pt used to see Dr. [name] in the past. Pt discussed with Dr. [name] and pt decided to go for periodic blood counts. Pt white blood cell count on 11-23-2014 was 47.9. Pt hb 11.5/32.8. Pt son does not want to repeat blood work today. Occasional dizziness. Overall weakness. Not much of appetite. Eats very little. She has been like this for 2 months. Pt had various side effects to chemotherapy and she decided to stop.  Pt was admitted to hosptial for diverticulitis. She was admitted and given IV. Pt had sigomoid diverticulitis with abscess. She was discharged and followed up with GI but missed surgeon appointment.   Pt was supposed to see [facility]. Family accidentally missed that appointment. Son states that was by accident. Now they have appointment for 27th of June. Pt not on any antibiotics orally since discharge. She can't tolerate oral antibitoics. Recently seen by GI. No nausea, no vomiting, no fevers, no chills, and no diaphoreses.  I reviewed ct scans today.  Anemia history. Stable recently. Platlets mild low  While on antibiotic in hospital her white count came down.      Review of Systems  Constitutional: Positive for fatigue. Negative for fever, chills and diaphoresis.  Cardiovascular: Negative for chest pain and palpitations.  Gastrointestinal: Negative for nausea, vomiting, abdominal pain, diarrhea, constipation, blood in stool, abdominal distention, anal bleeding and rectal pain.  Musculoskeletal: Negative for back pain.  Skin: Negative for rash.  Neurological: Positive for dizziness. Negative for seizures, syncope, facial asymmetry, speech difficulty, weakness, light-headedness, numbness and headaches.       Intermittent and for months.  Hematological: Negative for  adenopathy. Does not bruise/bleed easily.  Psychiatric/Behavioral: Negative for suicidal ideas, hallucinations, behavioral problems, confusion, self-injury and agitation. The patient is not nervous/anxious.      Past Medical History  Diagnosis Date  . Asthma   . Arthritis   . Shingles   . Anemia, iron deficiency 06/06/2012  . Chronic lymphatic leukemia   . Gallstones   . Diverticulitis   . COPD (chronic obstructive pulmonary disease)   . Cataract   . Blood transfusion without reported diagnosis   . GERD (gastroesophageal reflux disease)   . Neuromuscular disorder     History   Social History  . Marital Status: Married    Spouse Name: N/A  . Number of Children: 2  . Years of Education: N/A   Occupational History  . Retired    Social History Main Topics  . Smoking status: Never Smoker   . Smokeless tobacco: Never Used     Comment: PT NEVER SMOKED  . Alcohol Use: No  . Drug Use: No  . Sexual Activity: Not on file   Other Topics Concern  . Not on file   Social History Narrative    Past Surgical History  Procedure Laterality Date  . None    . Esophagogastroduodenoscopy N/A 11/07/2014    Procedure: ESOPHAGOGASTRODUODENOSCOPY (EGD);  Surgeon: Gatha Mayer, MD;  Location: [facility];  Service: Endoscopy;  Laterality: N/A;    Family History  Problem Relation Age of Onset  . Diabetes Father   . Stomach cancer Sister   . Colon cancer Neg Hx   . Colon polyps Neg Hx   . Esophageal  cancer Neg Hx   . Gallbladder disease Mother     Allergies  Allergen Reactions  . Contrast Media [Iodinated Diagnostic Agents] Other (See Comments)    Stomach discomfort  . Fruit & Vegetable Daily [Nutritional Supplements] Other (See Comments)    Citrus fruit causes stomach discomfort  . Ibuprofen Other (See Comments)    Stomach discomfort    Current Outpatient Prescriptions on File Prior to Visit  Medication Sig Dispense Refill  . albuterol (PROVENTIL HFA;VENTOLIN HFA) 108  (90 BASE) MCG/ACT inhaler Inhale 1-2 puffs into the lungs every 6 (six) hours as needed for wheezing or shortness of breath.    Marland Kitchen albuterol (PROVENTIL) (2.5 MG/3ML) 0.083% nebulizer solution Take 2.5 mg by nebulization every 6 (six) hours as needed for wheezing or shortness of breath.    . calcitonin, salmon, (MIACALCIN/FORTICAL) 200 UNIT/ACT nasal spray Place 1 spray into alternate nostrils daily. 3.7 mL 12  . dicyclomine (BENTYL) 10 MG capsule Take one po BID prn 90 capsule 0  . fluconazole (DIFLUCAN) 100 MG tablet Take 1 tablet (100 mg total) by mouth daily. 7 tablet 0  . gabapentin (NEURONTIN) 100 MG capsule Take 1 capsule (100 mg total) by mouth daily. (Patient taking differently: Take 100 mg by mouth daily as needed (for nerve pain). ) 30 capsule 1  . pantoprazole (PROTONIX) 40 MG tablet Take 1 tablet (40 mg total) by mouth daily. 30 tablet 2  . predniSONE (DELTASONE) 5 MG tablet Take 1 tablet (5 mg total) by mouth daily with breakfast. 30 tablet 0  . saccharomyces boulardii (FLORASTOR) 250 MG capsule Take 1 capsule (250 mg total) by mouth 2 (two) times daily. 60 capsule 1  . senna (SENOKOT) 8.6 MG TABS tablet Take 1-2 tablets by mouth daily as needed for mild constipation.    . Simethicone (GAS-X PO) Take 2 capsules by mouth daily.     . sucralfate (CARAFATE) 1 G tablet Take 2 tablets in AM and HS 120 tablet 0   No current facility-administered medications on file prior to visit.    BP 103/50 mmHg  Pulse 80  Temp(Src) 98.5 F (36.9 C) (Oral)  Ht 4\' 9"  (1.448 m)  Wt 122 lb 12.8 oz (55.702 kg)  BMI 26.57 kg/m2  SpO2 97%        Objective:   Physical Exam  General  Mental Status - Alert. General Appearance - Well groomed. Not in acute distress.  Skin Rashes- No Rashes.  HEENT Head- Normal.   Neck Neck- Supple. No Masses.   Chest and Lung Exam Auscultation: Breath Sounds:- even and unlabored.  Cardiovascular Auscultation:Rythm- Regular, rate and rhythm. Murmurs  & Other Heart Sounds:Ausculatation of the heart reveal- No Murmurs.  Lymphatic Head & Neck General Head & Neck Lymphatics: Bilateral: Description- No Localized lymphadenopathy.  Lower ext- no pedal edema. No homans signs.  Abdomen Inspection:-Inspection Normal.  Palpation/Perucssion: Palpation and Percussion of the abdomen reveal- Non Tender, No Rebound tenderness, No rigidity(Guarding) and No Palpable abdominal masses.  Liver:-Normal.  Spleen:- Normal.     Neurologic Cranial Nerve exam:- CN III-XII intact(No nystagmus), symmetric smile. Strength:- 5/5 equal and symmetric strength both upper and lower extremities.  .     Assessment & Plan:

## 2014-12-04 NOTE — Progress Notes (Signed)
Pre visit review using our clinic review tool, if applicable. No additional management support is needed unless otherwise documented below in the visit note. 

## 2014-12-04 NOTE — Assessment & Plan Note (Signed)
Recent course and received IV antibiotics. Pt seemed symptomaticaly controlled but baseline poor appetite. WBC recent elevation. Unclear if from cll or underlying chronic diverticultiis. She also missed her appointment with surgeon.  Will try to get her quicker appoitnment.

## 2014-12-04 NOTE — Assessment & Plan Note (Signed)
Recent wbc of 47.9. Feels week and dizzy at times with recent superimposed diverticulitis and hospitalization. Pt looks clinicaly stable by exam. Vitals good except bp mild low.   I will repeat cbc stat and cmp.   Then send results to Dr. [name].

## 2014-12-04 NOTE — Patient Instructions (Addendum)
CLL (chronic lymphocytic leukemia) Recent wbc of 47.9. Feels week and dizzy at times with recent superimposed diverticulitis and hospitalization. Pt looks clinicaly stable by exam. Vitals good except bp mild low.   I will repeat cbc stat and cmp.   Then send results to Dr. [name].   Diverticulitis Recent course and received IV antibiotics. Pt seemed symptomaticaly controlled but baseline poor appetite. WBC recent elevation. Unclear if from cll or underlying chronic diverticultiis. She also missed her appointment with surgeon.  Will try to get her quicker appoitnment.     I will get results and send to Dr. [name]. Then contact you. If sympotms change or worsen then advise ED evaluation.  Follow 5 days or as needed.  I did ask pt son to have her do hemoccult cards and turn in.

## 2014-12-06 DIAGNOSIS — J45901 Unspecified asthma with (acute) exacerbation: Secondary | ICD-10-CM | POA: Diagnosis not present

## 2014-12-06 DIAGNOSIS — K5720 Diverticulitis of large intestine with perforation and abscess without bleeding: Secondary | ICD-10-CM | POA: Diagnosis not present

## 2014-12-06 DIAGNOSIS — R1310 Dysphagia, unspecified: Secondary | ICD-10-CM | POA: Diagnosis not present

## 2014-12-06 DIAGNOSIS — C9110 Chronic lymphocytic leukemia of B-cell type not having achieved remission: Secondary | ICD-10-CM | POA: Diagnosis not present

## 2014-12-07 DIAGNOSIS — R1310 Dysphagia, unspecified: Secondary | ICD-10-CM | POA: Diagnosis not present

## 2014-12-07 DIAGNOSIS — K5720 Diverticulitis of large intestine with perforation and abscess without bleeding: Secondary | ICD-10-CM | POA: Diagnosis not present

## 2014-12-07 DIAGNOSIS — J45901 Unspecified asthma with (acute) exacerbation: Secondary | ICD-10-CM | POA: Diagnosis not present

## 2014-12-07 DIAGNOSIS — C9110 Chronic lymphocytic leukemia of B-cell type not having achieved remission: Secondary | ICD-10-CM | POA: Diagnosis not present

## 2014-12-10 ENCOUNTER — Other Ambulatory Visit: Payer: Self-pay | Admitting: *Deleted

## 2014-12-10 DIAGNOSIS — C9110 Chronic lymphocytic leukemia of B-cell type not having achieved remission: Secondary | ICD-10-CM | POA: Diagnosis not present

## 2014-12-10 DIAGNOSIS — J45901 Unspecified asthma with (acute) exacerbation: Secondary | ICD-10-CM | POA: Diagnosis not present

## 2014-12-10 DIAGNOSIS — R1310 Dysphagia, unspecified: Secondary | ICD-10-CM | POA: Diagnosis not present

## 2014-12-10 DIAGNOSIS — K5720 Diverticulitis of large intestine with perforation and abscess without bleeding: Secondary | ICD-10-CM | POA: Diagnosis not present

## 2014-12-11 ENCOUNTER — Ambulatory Visit: Payer: Medicare Other | Admitting: Hematology & Oncology

## 2014-12-11 ENCOUNTER — Other Ambulatory Visit: Payer: Self-pay | Admitting: Internal Medicine

## 2014-12-11 ENCOUNTER — Other Ambulatory Visit: Payer: Medicare Other

## 2014-12-12 DIAGNOSIS — R1310 Dysphagia, unspecified: Secondary | ICD-10-CM | POA: Diagnosis not present

## 2014-12-12 DIAGNOSIS — J45901 Unspecified asthma with (acute) exacerbation: Secondary | ICD-10-CM | POA: Diagnosis not present

## 2014-12-12 DIAGNOSIS — K5720 Diverticulitis of large intestine with perforation and abscess without bleeding: Secondary | ICD-10-CM | POA: Diagnosis not present

## 2014-12-12 DIAGNOSIS — C9110 Chronic lymphocytic leukemia of B-cell type not having achieved remission: Secondary | ICD-10-CM | POA: Diagnosis not present

## 2014-12-13 ENCOUNTER — Telehealth: Payer: Self-pay | Admitting: Medical

## 2014-12-13 ENCOUNTER — Telehealth: Payer: Self-pay | Admitting: Hematology & Oncology

## 2014-12-13 DIAGNOSIS — J45901 Unspecified asthma with (acute) exacerbation: Secondary | ICD-10-CM | POA: Diagnosis not present

## 2014-12-13 DIAGNOSIS — R1310 Dysphagia, unspecified: Secondary | ICD-10-CM | POA: Diagnosis not present

## 2014-12-13 DIAGNOSIS — C9110 Chronic lymphocytic leukemia of B-cell type not having achieved remission: Secondary | ICD-10-CM | POA: Diagnosis not present

## 2014-12-13 DIAGNOSIS — K5720 Diverticulitis of large intestine with perforation and abscess without bleeding: Secondary | ICD-10-CM | POA: Diagnosis not present

## 2014-12-13 NOTE — Telephone Encounter (Signed)
Caller name: Ellsie Violette Relationship to patient: son Can be reached: [phone]  Reason for call: Received call from pt son asking if we were able to get appt moved up with [facility]. He also states that pt is experiencing symptoms of diverticulitis again. He is concerned and requesting a call from [place].

## 2014-12-13 NOTE — Telephone Encounter (Signed)
Caller name: elaine from [facility] Relation to pt: Call back number: [phone] Pharmacy:  Reason for call:   Patient is weak and having pain in left side. [facility] has appointment on 12/20/14 and cannot get in any sooner.

## 2014-12-14 NOTE — Telephone Encounter (Signed)
Referal to general surgeon. Check with referal staff.

## 2014-12-14 NOTE — Telephone Encounter (Signed)
Please advise 

## 2014-12-14 NOTE — Telephone Encounter (Signed)
I did not see this note from son until today June 17,2016. I will ask lpn to call pt since I have completely booked schedule this afternoon. Advise pt to go to ED. Very important in light of her leukemia diagnosis and diverticulitis history. I did put in referral to surgeon group. Will ask our staff contact them. But she needs to be seen by ED today.

## 2014-12-17 DIAGNOSIS — K5732 Diverticulitis of large intestine without perforation or abscess without bleeding: Secondary | ICD-10-CM | POA: Diagnosis not present

## 2014-12-17 NOTE — Telephone Encounter (Signed)
Error

## 2014-12-21 LAB — POC HEMOCCULT BLD/STL (HOME/3-CARD/SCREEN)
Card #2 Fecal Occult Blod, POC: NEGATIVE
FECAL OCCULT BLD: NEGATIVE
FECAL OCCULT BLD: NEGATIVE

## 2014-12-21 NOTE — Addendum Note (Signed)
Addended by: Peggyann Shoals on: 12/21/2014 10:27 AM   Modules accepted: Orders

## 2014-12-26 ENCOUNTER — Telehealth: Payer: Self-pay

## 2014-12-26 ENCOUNTER — Encounter: Payer: Self-pay | Admitting: Medical

## 2014-12-26 NOTE — Telephone Encounter (Signed)
Son called back he asked that I reschedule her to a Friday.  Appt moved to 02/22/15 9:45

## 2014-12-26 NOTE — Telephone Encounter (Signed)
Per Cecille Rubin Hvozdovic, PA patient needs follow up office visit with Dr. Fuller Plan to discuss colonoscopy.  She is scheduled for 02/13/15 2:45 Left message for patient to call back

## 2014-12-26 NOTE — Progress Notes (Signed)
The hospital actually referred post hospitalization for diverticulitis. I expidited the referral. Also I referred to Dr. [name]. Would you ask Stefanie Henry to call his office to see if pt has appointment with Dr. [name].  Thanks, Percell Miller

## 2014-12-28 ENCOUNTER — Telehealth: Payer: Self-pay | Admitting: *Deleted

## 2014-12-28 ENCOUNTER — Other Ambulatory Visit: Payer: Self-pay | Admitting: Physician Assistant

## 2014-12-28 NOTE — Telephone Encounter (Signed)
Margaretha Sheffield RN from [facility] calling to notify office that they have made attempts all week to contact patient and her son to arrange for a home visit. They have not heard back from either party despite leaving several messages. They are required to notify office. Notified Dr [name] with no orders received. Called Margaretha Sheffield back at [facility] to notify her that Dr [name] is not the primary physician, as they have documented, but Mackie Pai PA-C is. She has made this note.

## 2015-01-18 ENCOUNTER — Ambulatory Visit: Payer: Medicare Other | Admitting: Hematology & Oncology

## 2015-01-18 ENCOUNTER — Other Ambulatory Visit: Payer: Medicare Other

## 2015-01-19 ENCOUNTER — Other Ambulatory Visit: Payer: Self-pay | Admitting: Physician Assistant

## 2015-02-03 ENCOUNTER — Other Ambulatory Visit: Payer: Self-pay | Admitting: Internal Medicine

## 2015-02-03 ENCOUNTER — Other Ambulatory Visit: Payer: Self-pay | Admitting: Hematology & Oncology

## 2015-02-05 ENCOUNTER — Other Ambulatory Visit: Payer: Self-pay | Admitting: Physician Assistant

## 2015-02-12 ENCOUNTER — Emergency Department (HOSPITAL_BASED_OUTPATIENT_CLINIC_OR_DEPARTMENT_OTHER)
Admission: EM | Admit: 2015-02-12 | Discharge: 2015-02-12 | Disposition: A | Discharge status: Home / Self Care | Payer: Medicare Other | Attending: Emergency Medicine | Admitting: Emergency Medicine

## 2015-02-12 ENCOUNTER — Encounter (HOSPITAL_BASED_OUTPATIENT_CLINIC_OR_DEPARTMENT_OTHER): Payer: Self-pay | Admitting: *Deleted

## 2015-02-12 ENCOUNTER — Emergency Department (HOSPITAL_BASED_OUTPATIENT_CLINIC_OR_DEPARTMENT_OTHER): Payer: Medicare Other

## 2015-02-12 ENCOUNTER — Other Ambulatory Visit: Payer: Self-pay | Admitting: Emergency Medicine

## 2015-02-12 DIAGNOSIS — R0602 Shortness of breath: Secondary | ICD-10-CM | POA: Diagnosis not present

## 2015-02-12 DIAGNOSIS — R531 Weakness: Secondary | ICD-10-CM | POA: Diagnosis not present

## 2015-02-12 DIAGNOSIS — Z7952 Long term (current) use of systemic steroids: Secondary | ICD-10-CM | POA: Insufficient documentation

## 2015-02-12 DIAGNOSIS — Z79899 Other long term (current) drug therapy: Secondary | ICD-10-CM | POA: Insufficient documentation

## 2015-02-12 DIAGNOSIS — Z856 Personal history of leukemia: Secondary | ICD-10-CM | POA: Diagnosis not present

## 2015-02-12 DIAGNOSIS — R0600 Dyspnea, unspecified: Secondary | ICD-10-CM

## 2015-02-12 DIAGNOSIS — M1990 Unspecified osteoarthritis, unspecified site: Secondary | ICD-10-CM | POA: Diagnosis not present

## 2015-02-12 DIAGNOSIS — N390 Urinary tract infection, site not specified: Secondary | ICD-10-CM | POA: Insufficient documentation

## 2015-02-12 DIAGNOSIS — Z8719 Personal history of other diseases of the digestive system: Secondary | ICD-10-CM | POA: Diagnosis not present

## 2015-02-12 DIAGNOSIS — J441 Chronic obstructive pulmonary disease with (acute) exacerbation: Secondary | ICD-10-CM | POA: Insufficient documentation

## 2015-02-12 DIAGNOSIS — Z862 Personal history of diseases of the blood and blood-forming organs and certain disorders involving the immune mechanism: Secondary | ICD-10-CM | POA: Insufficient documentation

## 2015-02-12 DIAGNOSIS — R42 Dizziness and giddiness: Secondary | ICD-10-CM | POA: Insufficient documentation

## 2015-02-12 DIAGNOSIS — Z8619 Personal history of other infectious and parasitic diseases: Secondary | ICD-10-CM | POA: Diagnosis not present

## 2015-02-12 DIAGNOSIS — R197 Diarrhea, unspecified: Secondary | ICD-10-CM | POA: Insufficient documentation

## 2015-02-12 DIAGNOSIS — H269 Unspecified cataract: Secondary | ICD-10-CM | POA: Diagnosis not present

## 2015-02-12 DIAGNOSIS — R51 Headache: Secondary | ICD-10-CM | POA: Insufficient documentation

## 2015-02-12 LAB — URINALYSIS, ROUTINE W REFLEX MICROSCOPIC
Bilirubin Urine: NEGATIVE
GLUCOSE, UA: NEGATIVE mg/dL
Hgb urine dipstick: NEGATIVE
Ketones, ur: NEGATIVE mg/dL
Nitrite: NEGATIVE
Protein, ur: NEGATIVE mg/dL
SPECIFIC GRAVITY, URINE: 1.006 (ref 1.005–1.030)
UROBILINOGEN UA: 0.2 mg/dL (ref 0.0–1.0)
pH: 6 (ref 5.0–8.0)

## 2015-02-12 LAB — CBC WITH DIFFERENTIAL/PLATELET
BLASTS: 0 %
Band Neutrophils: 0 % (ref 0–10)
Basophils Absolute: 0.4 10*3/uL — ABNORMAL HIGH (ref 0.0–0.1)
Basophils Relative: 1 % (ref 0–1)
EOS PCT: 0 % (ref 0–5)
Eosinophils Absolute: 0 10*3/uL (ref 0.0–0.7)
HEMATOCRIT: 32.7 % — AB (ref 36.0–46.0)
Hemoglobin: 10.3 g/dL — ABNORMAL LOW (ref 12.0–15.0)
LYMPHS PCT: 89 % — AB (ref 12–46)
Lymphs Abs: 38.7 10*3/uL — ABNORMAL HIGH (ref 0.7–4.0)
MCH: 22.9 pg — AB (ref 26.0–34.0)
MCHC: 31.5 g/dL (ref 30.0–36.0)
MCV: 72.7 fL — AB (ref 78.0–100.0)
MONOS PCT: 1 % — AB (ref 3–12)
Metamyelocytes Relative: 0 %
Monocytes Absolute: 0.4 10*3/uL (ref 0.1–1.0)
Myelocytes: 0 %
NEUTROS ABS: 3.9 10*3/uL (ref 1.7–7.7)
NRBC: 0 /100{WBCs}
Neutrophils Relative %: 9 % — ABNORMAL LOW (ref 43–77)
OTHER: 0 %
Platelets: 105 10*3/uL — ABNORMAL LOW (ref 150–400)
Promyelocytes Absolute: 0 %
RBC: 4.5 MIL/uL (ref 3.87–5.11)
RDW: 15 % (ref 11.5–15.5)
WBC: 43.4 10*3/uL — AB (ref 4.0–10.5)

## 2015-02-12 LAB — COMPREHENSIVE METABOLIC PANEL
ALT: 13 U/L — ABNORMAL LOW (ref 14–54)
AST: 15 U/L (ref 15–41)
Albumin: 3.9 g/dL (ref 3.5–5.0)
Alkaline Phosphatase: 62 U/L (ref 38–126)
Anion gap: 8 (ref 5–15)
BILIRUBIN TOTAL: 0.3 mg/dL (ref 0.3–1.2)
BUN: 8 mg/dL (ref 6–20)
CO2: 25 mmol/L (ref 22–32)
Calcium: 8.5 mg/dL — ABNORMAL LOW (ref 8.9–10.3)
Chloride: 99 mmol/L — ABNORMAL LOW (ref 101–111)
Creatinine, Ser: 0.53 mg/dL (ref 0.44–1.00)
Glucose, Bld: 112 mg/dL — ABNORMAL HIGH (ref 65–99)
POTASSIUM: 4.1 mmol/L (ref 3.5–5.1)
Sodium: 132 mmol/L — ABNORMAL LOW (ref 135–145)
TOTAL PROTEIN: 5.6 g/dL — AB (ref 6.5–8.1)

## 2015-02-12 LAB — BRAIN NATRIURETIC PEPTIDE: B Natriuretic Peptide: 117.2 pg/mL — ABNORMAL HIGH (ref 0.0–100.0)

## 2015-02-12 LAB — URINE MICROSCOPIC-ADD ON

## 2015-02-12 LAB — TROPONIN I

## 2015-02-12 LAB — D-DIMER, QUANTITATIVE: D-Dimer, Quant: 0.55 ug/mL-FEU — ABNORMAL HIGH (ref 0.00–0.48)

## 2015-02-12 MED ORDER — CEPHALEXIN 500 MG PO CAPS: 500.0000 mg | ORAL_CAPSULE | Freq: Four times a day (QID) | ORAL | Status: DC

## 2015-02-12 NOTE — ED Provider Notes (Signed)
CSN: [id number]     Arrival date & time 02/12/15  1053 History   First MD Initiated Contact with Patient 02/12/15 1113     Chief Complaint  Patient presents with  . Shortness of Breath     (Consider location/radiation/quality/duration/timing/severity/associated sxs/prior Treatment) HPI  81 year old female presents with increasing fatigue and weakness over the last 2 days. Has been feeling overall weakness for several months since she was last admitted to the hospital in May 2016. However over the last 2 days she has been having trouble getting up out of the bed due to "no energy". Patient has a history of CLL as well as COPD on chronic oxygen. Has been having intermittent headaches for the last 15 days, none now. Has lightheadedness but does not feel like she's going to pass out. No dizziness/room spinning sensation. Patient has been having increasing dyspnea, especially on exertion. Has been having multiple loose watery stools over the last couple days. Denies any abdominal pain that feels like her abdomen is tight. No blood in her stools or black stools. No focal weakness or confusion.  Past Medical History  Diagnosis Date  . Asthma   . Arthritis   . Shingles   . Anemia, iron deficiency 06/06/2012  . Chronic lymphatic leukemia   . Gallstones   . Diverticulitis   . COPD (chronic obstructive pulmonary disease)   . Cataract   . Blood transfusion without reported diagnosis   . GERD (gastroesophageal reflux disease)   . Neuromuscular disorder    Past Surgical History  Procedure Laterality Date  . None    . Esophagogastroduodenoscopy N/A 11/07/2014    Procedure: ESOPHAGOGASTRODUODENOSCOPY (EGD);  Surgeon: Gatha Mayer, MD;  Location: [facility];  Service: Endoscopy;  Laterality: N/A;   Family History  Problem Relation Age of Onset  . Diabetes Father   . Stomach cancer Sister   . Colon cancer Neg Hx   . Colon polyps Neg Hx   . Esophageal cancer Neg Hx   . Gallbladder disease  Mother    Social History  Substance Use Topics  . Smoking status: Never Smoker   . Smokeless tobacco: Never Used     Comment: PT NEVER SMOKED  . Alcohol Use: No   OB History    No data available     Review of Systems  Constitutional: Positive for fatigue. Negative for fever.  Respiratory: Positive for shortness of breath. Negative for cough and wheezing.   Cardiovascular: Positive for leg swelling (bilateral). Negative for chest pain.  Gastrointestinal: Positive for diarrhea. Negative for nausea, vomiting and abdominal pain.  Genitourinary: Negative for dysuria.  Neurological: Positive for weakness, light-headedness and headaches. Negative for dizziness.  All other systems reviewed and are negative.     Allergies  Contrast media; Fruit & vegetable daily; and Ibuprofen  Home Medications   Prior to Admission medications   Medication Sig Start Date End Date Taking? Authorizing Provider  albuterol (PROVENTIL HFA;VENTOLIN HFA) 108 (90 BASE) MCG/ACT inhaler Inhale 1-2 puffs into the lungs every 6 (six) hours as needed for wheezing or shortness of breath.   Yes Historical Provider, MD  albuterol (PROVENTIL) (2.5 MG/3ML) 0.083% nebulizer solution Take 2.5 mg by nebulization every 6 (six) hours as needed for wheezing or shortness of breath.   Yes Historical Provider, MD  calcitonin, salmon, (MIACALCIN/FORTICAL) 200 UNIT/ACT nasal spray Place 1 spray into alternate nostrils daily. 06/15/14  Yes Daniel J Angiulli, PA-C  dicyclomine (BENTYL) 10 MG capsule Take one po BID prn  11/29/14  Yes Lori P Hvozdovic, PA-C  gabapentin (NEURONTIN) 100 MG capsule Take 1 capsule (100 mg total) by mouth daily. Patient taking differently: Take 100 mg by mouth daily as needed (for nerve pain).  06/15/14  Yes Daniel J Angiulli, PA-C  hyoscyamine (LEVSIN SL) 0.125 MG SL tablet PLACE 1 TABLET (0.125 MG TOTAL) UNDER THE TONGUE EVERY 8 (EIGHT) HOURS AS NEEDED. 02/05/15  Yes Lori P Hvozdovic, PA-C  pantoprazole  (PROTONIX) 40 MG tablet Take 1 tablet (40 mg total) by mouth daily. 11/13/14  Yes Theodis Blaze, MD  predniSONE (DELTASONE) 5 MG tablet Take 1 tablet (5 mg total) by mouth daily with breakfast. 11/13/14  Yes Theodis Blaze, MD  saccharomyces boulardii (FLORASTOR) 250 MG capsule Take 1 capsule (250 mg total) by mouth 2 (two) times daily. 11/13/14  Yes Theodis Blaze, MD  senna (SENOKOT) 8.6 MG TABS tablet Take 1-2 tablets by mouth daily as needed for mild constipation.   Yes Historical Provider, MD  Simethicone (GAS-X PO) Take 2 capsules by mouth daily.    Yes Historical Provider, MD  sucralfate (CARAFATE) 1 G tablet TAKE 2 TABLETS IN THE MORNING AND AT BEDTIME 02/04/15  Yes Ladene Artist, MD  fluconazole (DIFLUCAN) 100 MG tablet Take 1 tablet (100 mg total) by mouth daily. 11/13/14   Theodis Blaze, MD  predniSONE (DELTASONE) 10 MG tablet TAKE 1 TABLET BY MOUTH EVERY DAY 02/04/15   [name], MD   BP 108/59 mmHg  Pulse 82  Temp(Src) 98.2 F (36.8 C) (Oral)  Resp 16  Ht 4\' 9"  (1.448 m)  Wt 120 lb (54.432 kg)  BMI 25.96 kg/m2  SpO2 98% Physical Exam  Constitutional: She is oriented to person, place, and time. She appears well-developed and well-nourished.  HENT:  Head: Normocephalic and atraumatic.  Right Ear: External ear normal.  Left Ear: External ear normal.  Nose: Nose normal.  Eyes: EOM are normal. Pupils are equal, round, and reactive to light. Right eye exhibits no discharge. Left eye exhibits no discharge.  Neck: Neck supple.  Cardiovascular: Normal rate, regular rhythm and normal heart sounds.   Pulmonary/Chest: Effort normal and breath sounds normal.  Abdominal: Soft. There is no tenderness.  Neurological: She is alert and oriented to person, place, and time.  CN 2-12 grossly intact. 5/5 strength in all 4 extremities. Grossly normal sensation.  Skin: Skin is warm and dry.  Nursing note and vitals reviewed.   ED Course  Procedures (including critical care time) Labs  Review Labs Reviewed  URINALYSIS, ROUTINE W REFLEX MICROSCOPIC (NOT AT [facility]) - Abnormal; Notable for the following:    Leukocytes, UA TRACE (*)    All other components within normal limits  CBC WITH DIFFERENTIAL/PLATELET - Abnormal; Notable for the following:    WBC 43.4 (*)    Hemoglobin 10.3 (*)    HCT 32.7 (*)    MCV 72.7 (*)    MCH 22.9 (*)    Platelets 105 (*)    Neutrophils Relative % 9 (*)    Lymphocytes Relative 89 (*)    Monocytes Relative 1 (*)    Lymphs Abs 38.7 (*)    Basophils Absolute 0.4 (*)    All other components within normal limits  COMPREHENSIVE METABOLIC PANEL - Abnormal; Notable for the following:    Sodium 132 (*)    Chloride 99 (*)    Glucose, Bld 112 (*)    Calcium 8.5 (*)    Total Protein 5.6 (*)  ALT 13 (*)    All other components within normal limits  D-DIMER, QUANTITATIVE (NOT AT [facility]) - Abnormal; Notable for the following:    D-Dimer, Quant 0.55 (*)    All other components within normal limits  BRAIN NATRIURETIC PEPTIDE - Abnormal; Notable for the following:    B Natriuretic Peptide 117.2 (*)    All other components within normal limits  URINE MICROSCOPIC-ADD ON - Abnormal; Notable for the following:    Bacteria, UA MANY (*)    All other components within normal limits  TROPONIN I    Imaging Review Dg Chest 2 View  02/12/2015   CLINICAL DATA:  Shortness of breath since yesterday.  EXAM: CHEST  2 VIEW  COMPARISON:  10/16/2014  FINDINGS: Heart is borderline in size. No confluent airspace opacities or effusions. No acute bony abnormality.  IMPRESSION: Borderline heart size.  No active disease.   Electronically Signed   By: Rolm Baptise M.D.   On: 02/12/2015 12:19   Ct Head Wo Contrast  02/12/2015   CLINICAL DATA:  Headache for 15 days  EXAM: CT HEAD WITHOUT CONTRAST  TECHNIQUE: Contiguous axial images were obtained from the base of the skull through the vertex without intravenous contrast.  COMPARISON:  Head CT December 27, 2012 and brain MRI January 03, 2013  FINDINGS: Mild diffuse atrophy is stable. There is no intracranial mass, hemorrhage, extra-axial fluid collection, or midline shift. There is minimal small vessel disease in the centra semiovale bilaterally. Elsewhere gray-white compartments appear normal. No acute infarct evident. The bony calvarium appears intact. The mastoid air cells are clear. There is a small benign osteoma in the right ethmoid air cell complex, stable.  IMPRESSION: Mild atrophy with minimal periventricular small vessel disease. No hemorrhage, mass, or new gray - white compartment lesions/acute appearing infarct.   Electronically Signed   By: Lowella Grip III M.D.   On: 02/12/2015 12:26   I have personally reviewed and evaluated these images and lab results as part of my medical decision-making.   EKG Interpretation   Date/Time:  Tuesday February 12 2015 11:25:37 EDT Ventricular Rate:  79 PR Interval:  166 QRS Duration: 66 QT Interval:  340 QTC Calculation: 389 R Axis:   60 Text Interpretation:  Normal sinus rhythm Low voltage QRS Cannot rule out  Anterior infarct , age undetermined Abnormal ECG No old tracing to compare  Confirmed by Markiah Janeway  MD, Cherylene Ferrufino ([id number]) on 02/12/2015 11:24:53 AM      MDM   Final diagnoses:  Weakness  Dyspnea  UTI (lower urinary tract infection)    No obvious etiology for patient's fatigue/weakness. Normal neuro exam. No confusion. Electrolytes without significant abnormality. Anemia of 10 is at baseline, as well as significant leukocytosis. Patient's only risk factor for PE is active cancer, otherwise is low risk by Well's. With this and a negative age-adjusted ddimer in setting of no tachycardia, increased WOB or hypoxia on baseline O2 I doubt PE. Workup otherwise unremarkable except bacteria in urine. Will treat as possible UTI and refer back to PCP for close f/u. Discussed strict return precautions.    Sherwood Gambler, MD 02/12/15 2028

## 2015-02-12 NOTE — ED Notes (Signed)
Son of patient states the patient has a two days history of weakness, and this morning was sob.  Patient states she suddenly felt like she had no energy and for the last two days has progressively gotten worse. History of CLL.  Started chemo and had many problems, and chose to stop treatments two years ago.  Recent admission to the hospital for diverticulitis, and has had several loose stools this morning and yesterday morning, but denies any pain.

## 2015-02-12 NOTE — ED Notes (Signed)
Patient transported to X-ray 

## 2015-02-13 ENCOUNTER — Ambulatory Visit: Payer: Medicare Other | Admitting: Internal Medicine

## 2015-02-13 ENCOUNTER — Ambulatory Visit: Payer: Medicare Other | Admitting: Gastroenterology

## 2015-02-14 ENCOUNTER — Other Ambulatory Visit: Payer: Medicare Other

## 2015-02-22 ENCOUNTER — Ambulatory Visit (INDEPENDENT_AMBULATORY_CARE_PROVIDER_SITE_OTHER): Payer: Medicare Other | Admitting: Gastroenterology

## 2015-02-22 ENCOUNTER — Encounter: Payer: Self-pay | Admitting: Gastroenterology

## 2015-02-22 VITALS — BP 80/48 | HR 84 | Ht <= 58 in | Wt 123.0 lb

## 2015-02-22 DIAGNOSIS — R1013 Epigastric pain: Secondary | ICD-10-CM | POA: Diagnosis not present

## 2015-02-22 DIAGNOSIS — K5732 Diverticulitis of large intestine without perforation or abscess without bleeding: Secondary | ICD-10-CM

## 2015-02-22 DIAGNOSIS — K219 Gastro-esophageal reflux disease without esophagitis: Secondary | ICD-10-CM

## 2015-02-22 MED ORDER — PANTOPRAZOLE SODIUM 40 MG PO TBEC: 40.0000 mg | DELAYED_RELEASE_TABLET | Freq: Every day | ORAL | Status: DC

## 2015-02-22 MED ORDER — HYOSCYAMINE SULFATE 0.125 MG SL SUBL: SUBLINGUAL_TABLET | SUBLINGUAL | Status: DC

## 2015-02-22 MED ORDER — ALBUTEROL SULFATE HFA 108 (90 BASE) MCG/ACT IN AERS: 1.0000 | INHALATION_SPRAY | Freq: Four times a day (QID) | RESPIRATORY_TRACT | Status: DC | PRN

## 2015-02-22 MED ORDER — SUCRALFATE 1 G PO TABS: ORAL_TABLET | ORAL | Status: DC

## 2015-02-22 NOTE — Progress Notes (Signed)
    History of Present Illness: This is an 81 year old female accompanied by her son. She has ongoing dyspeptic complaints with early satiety, fullness, belching and bloating. Due to language barriers to son provides a substantial amount of translation. Upper endoscopy performed by Dr. Carlean Purl in May 2016 showed multiple small polyps with sampling biopsies showing benign fundic gland polyps. Her symptoms are helped by pantoprazole, hyoscyamine and Carafate however some of her ongoing symptoms are felt to be functional. She has recovered from diverticulitis requiring hospitalization. She has been quite weak and requires substantial assistance to walk with dyspnea after only taking a few steps. She came today in a wheelchair. She has oxygen dependent COPD.   Current Medications, Allergies, Past Medical History, Past Surgical History, Family History and Social History were reviewed in [employer] record.  Physical Exam: General: Well developed , well nourished, elderly, frail no acute distress Head: Normocephalic and atraumatic Eyes:  sclerae anicteric, EOMI Ears: Normal auditory acuity Mouth: No deformity or lesions Lungs: Clear throughout to auscultation Heart: Regular rate and rhythm; no murmurs, rubs or bruits Abdomen: Soft, non tender and non distended. No masses, hepatosplenomegaly or hernias noted. Normal Bowel sounds Musculoskeletal: Symmetrical with no gross deformities  Pulses:  Normal pulses noted Extremities: No clubbing, cyanosis, edema or deformities noted Neurological: Alert oriented x 4, grossly nonfocal Psychological:  Alert and cooperative. Depressed affect  Assessment and Recommendations:  1. Dyspepsia. Etiology unclear. May have GERD and IBS however several components of her symptoms appear to be functional. Continue pantoprazole, hyoscyamine and Carafate.  2. COPD, oxygen dependent. Patient's son states she missed her follow-up appointment with Dr.  Melvyn Novas and is out of her inhaler. Wil refill inhaler for one month and further refills per Dr. Melvyn Novas or her PCP.  3. Recent complicated diverticulitis resolved. Discussed the possibility of colonoscopy, virtual colonoscopy or air-contrast barium enema to further evaluate her colon. Both the patient and her son feels she is too weak to tolerate bowel prep and another procedure at this point. GI follow-up when necessary.

## 2015-02-22 NOTE — Patient Instructions (Addendum)
We have sent the following medications to your pharmacy for you to pick up at your convenience: Carafate, Protonix, Hyoscyamine, Proventil HFA .  Follow up as needed.

## 2015-03-05 ENCOUNTER — Other Ambulatory Visit: Payer: Self-pay | Admitting: Hematology & Oncology

## 2015-03-07 ENCOUNTER — Encounter: Payer: Self-pay | Admitting: Medical

## 2015-03-07 ENCOUNTER — Ambulatory Visit (INDEPENDENT_AMBULATORY_CARE_PROVIDER_SITE_OTHER): Payer: Medicare Other | Admitting: Medical

## 2015-03-07 VITALS — BP 98/76 | HR 78 | Temp 98.1°F | Resp 16 | Ht <= 58 in | Wt 122.8 lb

## 2015-03-07 DIAGNOSIS — H60391 Other infective otitis externa, right ear: Secondary | ICD-10-CM

## 2015-03-07 DIAGNOSIS — T148 Other injury of unspecified body region: Secondary | ICD-10-CM | POA: Diagnosis not present

## 2015-03-07 DIAGNOSIS — K5732 Diverticulitis of large intestine without perforation or abscess without bleeding: Secondary | ICD-10-CM | POA: Diagnosis not present

## 2015-03-07 DIAGNOSIS — C9110 Chronic lymphocytic leukemia of B-cell type not having achieved remission: Secondary | ICD-10-CM

## 2015-03-07 DIAGNOSIS — B0229 Other postherpetic nervous system involvement: Secondary | ICD-10-CM

## 2015-03-07 DIAGNOSIS — T148XXA Other injury of unspecified body region, initial encounter: Secondary | ICD-10-CM

## 2015-03-07 DIAGNOSIS — L989 Disorder of the skin and subcutaneous tissue, unspecified: Secondary | ICD-10-CM

## 2015-03-07 DIAGNOSIS — Z87440 Personal history of urinary (tract) infections: Secondary | ICD-10-CM

## 2015-03-07 DIAGNOSIS — Z23 Encounter for immunization: Secondary | ICD-10-CM | POA: Diagnosis not present

## 2015-03-07 MED ORDER — PANTOPRAZOLE SODIUM 40 MG PO TBEC: 40.0000 mg | DELAYED_RELEASE_TABLET | Freq: Every day | ORAL | Status: DC

## 2015-03-07 MED ORDER — MAGIC MOUTHWASH: ORAL | Status: DC

## 2015-03-07 MED ORDER — ALBUTEROL SULFATE HFA 108 (90 BASE) MCG/ACT IN AERS: 1.0000 | INHALATION_SPRAY | Freq: Four times a day (QID) | RESPIRATORY_TRACT | Status: DC | PRN

## 2015-03-07 MED ORDER — GABAPENTIN 100 MG PO CAPS: ORAL_CAPSULE | ORAL | Status: DC

## 2015-03-07 MED ORDER — NEOMYCIN-POLYMYXIN-HC 1 % OT SOLN: 3.0000 [drp] | Freq: Three times a day (TID) | OTIC | Status: DC

## 2015-03-07 MED ORDER — SUCRALFATE 1 G PO TABS: ORAL_TABLET | ORAL | Status: DC

## 2015-03-07 NOTE — Patient Instructions (Addendum)
You felt better with antibiotics that ED gave you. No current uti signs or symptoms. No testing needed today for uti.  For your neuralgia pain. Will increase neurontin to 200 mg tid. You request magic mouth wash for inside of the mouth. Will fill at your request to help with pain in buccal mucosa area after your eat.  You have decided not to do colonsocpy and no current diverticulitis type symptoms.  For mole on your face will refer you to dermatologist.  For the latisimus dorsi muscle pain. Can take occasional low dose ibuprofen 200 mg tab otc.  For the rt ear pain will rx cortisporin ear drops.  Lab work not done today since done end of august and those labs looked stable.  Follow up in 3 months or as needed

## 2015-03-07 NOTE — Progress Notes (Signed)
Pre visit review using our clinic review tool, if applicable. No additional management support is needed unless otherwise documented below in the visit note. 

## 2015-03-07 NOTE — Progress Notes (Signed)
Subjective:    Patient ID: Stefanie Henry, female    DOB: 11/01/1929, 81 y.o.   MRN: [id number]  HPI  Pt in for follow up in ED.She looked weak and was pale day of ED eval. Work up was negative for acute new problem. ED gave her antibiotic for possible uti. She felt better. No pain on urination. No abdominal pain. Occasional has transient gas pain  Pt son states that she is slowly getting better. Overall looks better and more energy.   I reviewed the ED note and labs.   Some pain rt side latissimus dorsi area of  back with movement on and off for weeks(no rash in this area). Lifts arm and bends over pain increases.   Hx of shingles in past 10 years. Intermittent sharp pain on her lt side of face for years. Her tongue left side has burning sensation on and off for years as well. She thinks her tongue is rough.No report of slurred speech. Pt is on gabapentin. She is on low dose.   Pt has CLL. I referred to Dr. [name]  but pt does not want to have tx. Per son had problems with treatment and therefore decided no treatment.  Pt labs 3 wks ago showed wbc 43.4. This is close to her normal range. Her hb/hct is stable.  Pt did see GI end of august opinion was that the diverticulitis had resolved. Pt son states she won't get colonoscopy      Review of Systems  Constitutional: Negative for fever, chills and fatigue.  HENT: Positive for ear pain.   Respiratory: Negative for apnea, cough and choking.   Cardiovascular: Negative for chest pain and palpitations.  Genitourinary: Negative for dysuria, urgency, frequency, flank pain and dyspareunia.  Musculoskeletal:       Lt tatisimuss dorsi pain. Maybe rib pain.  Skin:       Mole left side of her face.  Neurological: Negative for dizziness and headaches.       Lt side hx of post herpetic neuralgia pain.  Hematological: Negative for adenopathy. Does not bruise/bleed easily.  Psychiatric/Behavioral: Negative for behavioral problems, confusion and  self-injury. The patient is not nervous/anxious.     Past Medical History  Diagnosis Date  . Asthma   . Arthritis   . Shingles   . Anemia, iron deficiency 06/06/2012  . Chronic lymphatic leukemia   . Gallstones   . Diverticulitis   . COPD (chronic obstructive pulmonary disease)   . Cataract   . Blood transfusion without reported diagnosis   . GERD (gastroesophageal reflux disease)   . Neuromuscular disorder     Social History   Social History  . Marital Status: Married    Spouse Name: N/A  . Number of Children: 2  . Years of Education: N/A   Occupational History  . Retired    Social History Main Topics  . Smoking status: Never Smoker   . Smokeless tobacco: Never Used     Comment: PT NEVER SMOKED  . Alcohol Use: No  . Drug Use: No  . Sexual Activity: Not on file   Other Topics Concern  . Not on file   Social History Narrative    Past Surgical History  Procedure Laterality Date  . None    . Esophagogastroduodenoscopy N/A 11/07/2014    Procedure: ESOPHAGOGASTRODUODENOSCOPY (EGD);  Surgeon: Gatha Mayer, MD;  Location: [facility];  Service: Endoscopy;  Laterality: N/A;    Family History  Problem Relation Age of  Onset  . Diabetes Father   . Stomach cancer Sister   . Colon cancer Neg Hx   . Colon polyps Neg Hx   . Esophageal cancer Neg Hx   . Gallbladder disease Mother     Allergies  Allergen Reactions  . Contrast Media [Iodinated Diagnostic Agents] Other (See Comments)    Stomach discomfort  . Fruit & Vegetable Daily [Nutritional Supplements] Other (See Comments)    Citrus fruit causes stomach discomfort  . Ibuprofen Other (See Comments)    Stomach discomfort    Current Outpatient Prescriptions on File Prior to Visit  Medication Sig Dispense Refill  . albuterol (PROVENTIL HFA;VENTOLIN HFA) 108 (90 BASE) MCG/ACT inhaler Inhale 1-2 puffs into the lungs every 6 (six) hours as needed for wheezing or shortness of breath. 1 Inhaler 0  . albuterol  (PROVENTIL) (2.5 MG/3ML) 0.083% nebulizer solution Take 2.5 mg by nebulization every 6 (six) hours as needed for wheezing or shortness of breath.    . calcitonin, salmon, (MIACALCIN/FORTICAL) 200 UNIT/ACT nasal spray Place 1 spray into alternate nostrils daily. 3.7 mL 12  . fluconazole (DIFLUCAN) 100 MG tablet Take 1 tablet (100 mg total) by mouth daily. 7 tablet 0  . gabapentin (NEURONTIN) 100 MG capsule Take 1 capsule (100 mg total) by mouth daily. (Patient taking differently: Take 100 mg by mouth daily as needed (for nerve pain). ) 30 capsule 1  . hyoscyamine (LEVSIN SL) 0.125 MG SL tablet PLACE 1 TABLET (0.125 MG TOTAL) UNDER THE TONGUE EVERY 8 (EIGHT) HOURS AS NEEDED. 60 tablet 11  . pantoprazole (PROTONIX) 40 MG tablet Take 1 tablet (40 mg total) by mouth daily. 30 tablet 11  . predniSONE (DELTASONE) 10 MG tablet TAKE 1 TABLET BY MOUTH EVERY DAY 30 tablet 0  . predniSONE (DELTASONE) 5 MG tablet Take 1 tablet (5 mg total) by mouth daily with breakfast. 30 tablet 0  . saccharomyces boulardii (FLORASTOR) 250 MG capsule Take 1 capsule (250 mg total) by mouth 2 (two) times daily. 60 capsule 1  . senna (SENOKOT) 8.6 MG TABS tablet Take 1-2 tablets by mouth daily as needed for mild constipation.    . Simethicone (GAS-X PO) Take 2 capsules by mouth daily.     . sucralfate (CARAFATE) 1 G tablet TAKE 2 TABLETS IN THE MORNING AND AT BEDTIME 120 tablet 11   No current facility-administered medications on file prior to visit.    BP 98/76 mmHg  Pulse 78  Temp(Src) 98.1 F (36.7 C) (Oral)  Resp 16  Ht 4\' 9"  (1.448 m)  Wt 122 lb 12.8 oz (55.702 kg)  BMI 26.57 kg/m2  SpO2 98%       Objective:   Physical Exam  General  Mental Status - Alert. General Appearance - Well groomed. Not in acute distress.  Skin Rashes- No Rashes. Lt side of jaw 9 mm mole. With dark center.  HEENT Head- Normal. Ear Auditory Canal - Left- Normal but tender tragus Right - Normal.Tympanic Membrane- Left- Normal.  Right- Normal. Eye Sclera/Conjunctiva- Left- Normal. Right- Normal. Nose & Sinuses Nasal Mucosa- Left-  Not boggy or Congested. Right-  Not  boggy or Congested. Mouth & Throat Lips: Upper Lip- Normal: no dryness, cracking, pallor, cyanosis, or vesicular eruption. Lower Lip-Normal: no dryness, cracking, pallor, cyanosis or vesicular eruption. Buccal Mucosa- Bilateral- No Aphthous ulcers. Oropharynx- No Discharge or Erythema. Tongue normal appearance. No lesions. Buccal mucosa normal. Lt lower jaw one tooth appears to have large cavity. Tonsils: Characteristics- Bilateral- No Erythema or  Congestion. Size/Enlargement- Bilateral- No enlargement. Discharge- bilateral-None.  Neck Neck- Supple. No Masses.   Chest and Lung Exam Auscultation: Breath Sounds:- even and unlabored  Cardiovascular Auscultation:Rythm- Regular, rate and rhythm. Murmurs & Other Heart Sounds:Ausculatation of the heart reveal- No Murmurs.  Lymphatic Head & Neck General Head & Neck Lymphatics: Bilateral: Description- No Localized lymphadenopathy.  Back- no cva tenderness. No pain on palpation of back  except for pain on left latisimus dorsi area. When lifts arm or bends over this pain is increased.   Abdomen Inspection:-Inspection Normal.  Palpation/Perucssion: Palpation and Percussion of the abdomen reveal- Non Tender, No Rebound tenderness, No rigidity(Guarding) and No Palpable abdominal masses.  Liver:-Normal.  Spleen:- Normal.           Assessment & Plan:  You felt better with antibiotics that ED gave you. No current uti signs or symptoms. No testing needed today for uti.  For your neuralgia pain. Will increase neurontin to 200 mg tid. You request magic mouth wash for inside of the mouth. Will fill at your request to help with pain in buccal mucosa area after your eat.  You have decided not to do colonsocpy and no current diverticulitis type symptoms.  For mole on your face will refer you to  dermatologist.  For the latisimus dorsi muscle pain. Can take occasional low dose ibuprofen 200 mg tab otc.  For the rt ear pain will rx cortisporin ear drops.  Lab work not done today since done end of august and those labs looked stable.  Follow up in 3 months or as needed

## 2015-03-11 ENCOUNTER — Telehealth: Payer: Self-pay | Admitting: Medical

## 2015-03-11 NOTE — Telephone Encounter (Signed)
Son called and states that [facility] would not see the pt because they could not accept medicaid secondary. Please enter new referral or find another practice to accept pt.

## 2015-03-11 NOTE — Telephone Encounter (Signed)
Pt dermatology appointment was denied due to her insurance. Would you check and let me know. I got the message it was denied because medicaid is secondary insurance. So can we even make referral. If it is situation that we can't make referral let me know.

## 2015-03-11 NOTE — Telephone Encounter (Signed)
Stefanie Henry is working on new referral. Please see what she says on derm referral. Touch base with her by Friday this week.

## 2015-03-11 NOTE — Telephone Encounter (Signed)
Please advise about another referral.

## 2015-03-13 NOTE — Telephone Encounter (Signed)
Stefanie Henry will follow up on this tomorrow 03/14/2015.

## 2015-03-15 NOTE — Telephone Encounter (Signed)
Notified provider. He understands.

## 2015-03-15 NOTE — Telephone Encounter (Signed)
Patient has appt on 9/26 @3  with Dr Rozann Lesches [address] [place] [address]  [phone], pt son aware

## 2015-03-15 NOTE — Telephone Encounter (Signed)
Pt son Maurene Capes called to f/u on new referral. His # is [phone]. He said [facility] would not accept [facility] 2ndary insurance.

## 2015-03-25 DIAGNOSIS — M792 Neuralgia and neuritis, unspecified: Secondary | ICD-10-CM | POA: Diagnosis not present

## 2015-03-29 ENCOUNTER — Encounter: Payer: Self-pay | Admitting: Hematology & Oncology

## 2015-03-29 ENCOUNTER — Other Ambulatory Visit (HOSPITAL_BASED_OUTPATIENT_CLINIC_OR_DEPARTMENT_OTHER): Payer: Medicare Other

## 2015-03-29 ENCOUNTER — Ambulatory Visit (HOSPITAL_BASED_OUTPATIENT_CLINIC_OR_DEPARTMENT_OTHER): Payer: Medicare Other | Admitting: Hematology & Oncology

## 2015-03-29 VITALS — BP 104/56 | HR 83 | Temp 98.1°F | Resp 16 | Ht <= 58 in

## 2015-03-29 DIAGNOSIS — C9110 Chronic lymphocytic leukemia of B-cell type not having achieved remission: Secondary | ICD-10-CM

## 2015-03-29 DIAGNOSIS — R530 Neoplastic (malignant) related fatigue: Secondary | ICD-10-CM

## 2015-03-29 DIAGNOSIS — D509 Iron deficiency anemia, unspecified: Secondary | ICD-10-CM | POA: Diagnosis not present

## 2015-03-29 LAB — COMPREHENSIVE METABOLIC PANEL (CC13)
ALBUMIN: 3.7 g/dL (ref 3.5–5.0)
ALK PHOS: 63 U/L (ref 40–150)
ALT: 11 U/L (ref 0–55)
ANION GAP: 8 meq/L (ref 3–11)
AST: 10 U/L (ref 5–34)
BUN: 7.9 mg/dL (ref 7.0–26.0)
CALCIUM: 8.6 mg/dL (ref 8.4–10.4)
CO2: 25 mEq/L (ref 22–29)
Chloride: 102 mEq/L (ref 98–109)
Creatinine: 0.7 mg/dL (ref 0.6–1.1)
EGFR: 80 mL/min/{1.73_m2} — AB (ref >90)
Glucose: 129 mg/dl (ref 70–140)
POTASSIUM: 3.5 meq/L (ref 3.5–5.1)
Sodium: 135 mEq/L — ABNORMAL LOW (ref 136–145)
Total Bilirubin: 0.44 mg/dL (ref 0.20–1.20)
Total Protein: 5.4 g/dL — ABNORMAL LOW (ref 6.4–8.3)

## 2015-03-29 LAB — CBC WITH DIFFERENTIAL (CANCER CENTER ONLY)
HEMATOCRIT: 33.5 % — AB (ref 34.8–46.6)
HEMOGLOBIN: 10.6 g/dL — AB (ref 11.6–15.9)
MCH: 23.5 pg — AB (ref 26.0–34.0)
MCHC: 31.6 g/dL — AB (ref 32.0–36.0)
MCV: 74 fL — ABNORMAL LOW (ref 81–101)
Platelets: 100 10*3/uL — ABNORMAL LOW (ref 145–400)
RBC: 4.51 10*6/uL (ref 3.70–5.32)
RDW: 14.2 % (ref 11.1–15.7)
WBC: 48.9 10*3/uL — ABNORMAL HIGH (ref 3.9–10.0)

## 2015-03-29 LAB — MANUAL DIFFERENTIAL (CHCC SATELLITE)
ALC: 45.5 10*3/uL — AB (ref 0.6–2.2)
ANC (CHCC MAN DIFF): 2.9 10*3/uL (ref 1.5–6.7)
BAND NEUTROPHILS: 0 % (ref 0–10)
BASO: 0 % (ref 0–2)
BLASTS: 0 % (ref 0–0)
Eos: 0 % (ref 0–7)
LYMPH: 93 % — ABNORMAL HIGH (ref 14–48)
MONO: 1 % (ref 0–13)
MYELOCYTES: 0 % (ref 0–0)
Metamyelocytes: 0 % (ref 0–0)
OTHER COMMENTS: 0
Other Cells: 0 % (ref 0–0)
PLT EST ~~LOC~~: DECREASED
PROMYELO: 0 % (ref 0–0)
SEG: 6 % — ABNORMAL LOW (ref 40–75)
Variant Lymph: 0 % (ref 0–0)
nRBC: 0 % (ref 0–0)

## 2015-03-29 NOTE — Progress Notes (Signed)
Stefanie Henry  [phone] [phone]  ID: Stefanie Henry OB: 05-May-1930 MR#: [id number] EPP#:[id number] Patient Care Team: Mackie Pai, PA-C as PCP - General (Physician Assistant) Leeroy Cha as Consulting Physician (Internal Medicine)  DIAGNOSIS: Chronic lymphocytic leukemia Iron deficiency anemia  INTERVAL HISTORY: [name] is here today for follow-up. We have not treated her for probably 4 years for the CLL. She just got 1 cycle of treatment. She had a tough time with it. However, it certainly seemed to have helped. Her white cell count has really not gone up much.  Her problems have been non-oncologic area and she's had issue with her gallbladder. She had issues with diverticulitis. She's been hospitalized on several occasions.  I'm surprised, actually, as how well she looks. She really looks quite good.  She has some burning in the left ear. I think that this is postherpetic neuralgia from where she had shingles several years ago.  She's had some bleeding. Again this probably is from the diverticulitis.  She's had no rashes. She's had no egg swelling. She's had no fever. She's had a pretty decent appetite.   She did have a CT of the abdomen in May. It showed some splenomegaly. This appears be relatively stable.  CURRENT TREATMENT: Observation  REVIEW OF SYSTEMS: All other 10 point review of systems is negative.   PAST MEDICAL HISTORY: Past Medical History  Diagnosis Date  . Asthma   . Arthritis   . Shingles   . Anemia, iron deficiency 06/06/2012  . Chronic lymphatic leukemia   . Gallstones   . Diverticulitis   . COPD (chronic obstructive pulmonary disease)   . Cataract   . Blood transfusion without reported diagnosis   . GERD (gastroesophageal reflux disease)   . Neuromuscular disorder     PAST SURGICAL HISTORY: Past Surgical History  Procedure Laterality Date  . None    . Esophagogastroduodenoscopy N/A 11/07/2014     Procedure: ESOPHAGOGASTRODUODENOSCOPY (EGD);  Surgeon: Gatha Mayer, MD;  Location: [facility];  Service: Endoscopy;  Laterality: N/A;    FAMILY HISTORY Family History  Problem Relation Age of Onset  . Diabetes Father   . Stomach cancer Sister   . Colon cancer Neg Hx   . Colon polyps Neg Hx   . Esophageal cancer Neg Hx   . Gallbladder disease Mother     GYNECOLOGIC HISTORY:  No LMP recorded. Patient is postmenopausal.   SOCIAL HISTORY: Social History   Social History  . Marital Status: Married    Spouse Name: N/A  . Number of Children: 2  . Years of Education: N/A   Occupational History  . Retired    Social History Main Topics  . Smoking status: Never Smoker   . Smokeless tobacco: Never Used     Comment: PT NEVER SMOKED  . Alcohol Use: No  . Drug Use: No  . Sexual Activity: Not on file   Other Topics Concern  . Not on file   Social History Narrative    ADVANCED DIRECTIVES:  <no information>  HEALTH MAINTENANCE: Social History  Substance Use Topics  . Smoking status: Never Smoker   . Smokeless tobacco: Never Used     Comment: PT NEVER SMOKED  . Alcohol Use: No   Colonoscopy: PAP: Bone density: Lipid panel:  Allergies  Allergen Reactions  . Contrast Media [Iodinated Diagnostic Agents] Other (See Comments)    Stomach discomfort  . Fruit & Vegetable Daily [Nutritional Supplements] Other (See Comments)  Citrus fruit causes stomach discomfort  . Ibuprofen Other (See Comments)    Stomach discomfort    Current Outpatient Prescriptions  Medication Sig Dispense Refill  . albuterol (PROVENTIL HFA;VENTOLIN HFA) 108 (90 BASE) MCG/ACT inhaler Inhale 1-2 puffs into the lungs every 6 (six) hours as needed for wheezing or shortness of breath. 1 Inhaler 0  . calcitonin, salmon, (MIACALCIN/FORTICAL) 200 UNIT/ACT nasal spray Place 1 spray into alternate nostrils daily. 3.7 mL 12  . fluconazole (DIFLUCAN) 100 MG tablet Take 1 tablet (100 mg total) by mouth  daily. 7 tablet 0  . gabapentin (NEURONTIN) 100 MG capsule 2 tab po tid for nerve pain 180 capsule 3  . hyoscyamine (LEVSIN SL) 0.125 MG SL tablet PLACE 1 TABLET (0.125 MG TOTAL) UNDER THE TONGUE EVERY 8 (EIGHT) HOURS AS NEEDED. 60 tablet 11  . lidocaine (XYLOCAINE) 2 % solution     . magic mouthwash SOLN 5 ml po qid swish and spit. 200 mL 0  . NEOMYCIN-POLYMYXIN-HYDROCORTISONE (CORTISPORIN) 1 % SOLN otic solution Place 3 drops into the left ear every 8 (eight) hours. 10 mL 0  . pantoprazole (PROTONIX) 40 MG tablet Take 1 tablet (40 mg total) by mouth daily. 30 tablet 11  . predniSONE (DELTASONE) 10 MG tablet TAKE 1 TABLET BY MOUTH EVERY DAY 30 tablet 0  . predniSONE (DELTASONE) 5 MG tablet Take 1 tablet (5 mg total) by mouth daily with breakfast. 30 tablet 0  . saccharomyces boulardii (FLORASTOR) 250 MG capsule Take 1 capsule (250 mg total) by mouth 2 (two) times daily. 60 capsule 1  . senna (SENOKOT) 8.6 MG TABS tablet Take 1-2 tablets by mouth daily as needed for mild constipation.    . Simethicone (GAS-X PO) Take 2 capsules by mouth daily.     . sucralfate (CARAFATE) 1 G tablet TAKE 2 TABLETS IN THE MORNING AND AT BEDTIME 120 tablet 11   No current facility-administered medications for this visit.    OBJECTIVE: Filed Vitals:   03/29/15 0923  BP: 104/56  Pulse: 83  Temp: 98.1 F (36.7 C)  Resp: 16   There were no vitals filed for this visit. ECOG FS:2 - Symptomatic, <50% confined to bed Elderly [place] female. Head and neck exam shows no adenopathy in the neck. I looked at her tympanic membranes. She has some scarring noted in the left tympanic membrane. Her lungs are clear. Cardiac exam regular rate and rhythm with no murmurs, rubs or bruits. Abdomen is soft. She has good bowel sounds. There is no fluid wave. There is no palpable liver or spleen tip. Back exam shows no tenderness over the spine, ribs or hips. Extremities shows no clubbing, cyanosis or edema. Skin exam shows no rashes.  Neurological exam is nonfocal.   LAB RESULTS: CMP     Component Value Date/Time   NA 132* 02/12/2015 1140   NA 129 01/16/2013 0851   K 4.1 02/12/2015 1140   K 2.7* 01/16/2013 0851   CL 99* 02/12/2015 1140   CL 93* 01/16/2013 0851   CO2 25 02/12/2015 1140   CO2 29 01/16/2013 0851   GLUCOSE 112* 02/12/2015 1140   GLUCOSE 168* 01/16/2013 0851   BUN 8 02/12/2015 1140   BUN 11 01/16/2013 0851   CREATININE 0.53 02/12/2015 1140   CREATININE 0.7 01/16/2013 0851   CALCIUM 8.5* 02/12/2015 1140   CALCIUM 8.4 01/16/2013 0851   PROT 5.6* 02/12/2015 1140   PROT 5.3* 01/16/2013 0851   ALBUMIN 3.9 02/12/2015 1140   AST 15  02/12/2015 1140   AST 15 01/16/2013 0851   ALT 13* 02/12/2015 1140   ALT 13 01/16/2013 0851   ALKPHOS 62 02/12/2015 1140   ALKPHOS 59 01/16/2013 0851   BILITOT 0.3 02/12/2015 1140   BILITOT 0.80 01/16/2013 0851   GFRNONAA >60 02/12/2015 1140   GFRAA >60 02/12/2015 1140   INo results found for: SPEP, UPEP Lab Results  Component Value Date   WBC 48.9* 03/29/2015   NEUTROABS 3.9 02/12/2015   HGB 10.6* 03/29/2015   HCT 33.5* 03/29/2015   MCV 74* 03/29/2015   PLT 100* 03/29/2015   No results found for: LABCA2 No components found for: OIBBC488 No results for input(s): INR in the last 168 hours.  STUDIES:   ASSESSMENT/PLAN: Ms. Ohms is a very pleasant 81 year old [place] female. She has CLL. However, the CLL seems to be relatively stable.  Her hemoglobin is down a little bit. I suspect that she probably has some degree of iron deficiency. I'm not going to give her any iron. I think she is on some oral iron.  We will plan to get her back to see [place] in 6 months. I think this would be reasonable. We'll try to get her through the rest of the year in the winter months so that when she goes back whether should be okay.   [name], MD 03/29/2015 9:45 AM

## 2015-04-17 ENCOUNTER — Other Ambulatory Visit: Payer: Self-pay | Admitting: Medical

## 2015-04-18 ENCOUNTER — Encounter: Payer: Self-pay | Admitting: Gastroenterology

## 2015-04-18 ENCOUNTER — Ambulatory Visit (INDEPENDENT_AMBULATORY_CARE_PROVIDER_SITE_OTHER): Payer: Medicare Other | Admitting: Gastroenterology

## 2015-04-18 VITALS — BP 110/56 | HR 88 | Ht <= 58 in | Wt 125.6 lb

## 2015-04-18 DIAGNOSIS — R1013 Epigastric pain: Secondary | ICD-10-CM

## 2015-04-18 DIAGNOSIS — K921 Melena: Secondary | ICD-10-CM

## 2015-04-18 DIAGNOSIS — K5732 Diverticulitis of large intestine without perforation or abscess without bleeding: Secondary | ICD-10-CM | POA: Diagnosis not present

## 2015-04-18 MED ORDER — HYOSCYAMINE SULFATE 0.125 MG SL SUBL: SUBLINGUAL_TABLET | SUBLINGUAL | Status: DC

## 2015-04-18 MED ORDER — SUCRALFATE 1 G PO TABS: ORAL_TABLET | ORAL | Status: DC

## 2015-04-18 NOTE — Patient Instructions (Signed)
We have sent the following medications to your pharmacy for you to pick up at your convenience:Carafate and Levsin.   Follow up with your primary care physician.   Thank you for choosing me and [facility].  Pricilla Riffle. Dagoberto Ligas., MD., Marval Regal

## 2015-04-18 NOTE — Progress Notes (Signed)
    History of Present Illness: This is an 81 year old female accompanied by her son. Son provides translation. She had an episode of BRBPR a couple weeks ago. No recurrence. No abdominal pain, constipation or fever. She was evaluated in the office in late August. Please refer to that note for additional information on her dyspeptic symptoms and complicated diverticulitis.   Current Medications, Allergies, Past Medical History, Past Surgical History, Family History and Social History were reviewed in [employer] record.  Physical Exam: General: Well developed, well nourished, wheelchair, no acute distress Head: Normocephalic and atraumatic Eyes:  sclerae anicteric, EOMI Ears: Normal auditory acuity Mouth: No deformity or lesions Lungs: Clear throughout to auscultation Heart: Regular rate and rhythm; no murmurs, rubs or bruits Abdomen: Soft, non tender and non distended. No masses, hepatosplenomegaly or hernias noted. Normal Bowel sounds Rectal: Pt refused DRE despite my strong recommendation to proceed Musculoskeletal: Symmetrical with no gross deformities  Pulses:  Normal pulses noted Extremities: No clubbing, cyanosis, edema or deformities noted Neurological: Alert oriented x 4, grossly nonfocal Psychological:  Alert and cooperative. Normal mood and affect  Assessment and Recommendations:  1. Dyspepsia. Etiology unclear. May have GERD, IBS or functional dyspepsia. Continue pantoprazole, hyoscyamine and Carafate. Consider adding FDgard if symptoms not well controlled. Follow up with PCP.   2. COPD, oxygen dependent. Follow up Dr. Melvyn Novas or PCP.  3. Small volume hematochezia and recent complicated diverticulitis that has resolved. Pt declines a DRE. As we discussed at her last visit we again discussed the possibility of colonoscopy, virtual colonoscopy or air-contrast barium enema to further evaluate her colon. Both the patient and her son feels she will not tolerate a  bowel prep or any of these tests at this point. They decline to schedule.

## 2015-04-25 ENCOUNTER — Other Ambulatory Visit: Payer: Self-pay | Admitting: Hematology & Oncology

## 2015-05-02 DIAGNOSIS — R05 Cough: Secondary | ICD-10-CM | POA: Diagnosis not present

## 2015-05-02 DIAGNOSIS — R0602 Shortness of breath: Secondary | ICD-10-CM | POA: Diagnosis not present

## 2015-05-02 DIAGNOSIS — J3089 Other allergic rhinitis: Secondary | ICD-10-CM | POA: Diagnosis not present

## 2015-05-02 DIAGNOSIS — J9801 Acute bronchospasm: Secondary | ICD-10-CM | POA: Diagnosis not present

## 2015-05-14 ENCOUNTER — Encounter: Payer: Self-pay | Admitting: Internal Medicine

## 2015-05-14 ENCOUNTER — Ambulatory Visit (INDEPENDENT_AMBULATORY_CARE_PROVIDER_SITE_OTHER): Payer: Medicare Other | Admitting: Internal Medicine

## 2015-05-14 VITALS — BP 112/70 | HR 83 | Ht <= 58 in | Wt 127.0 lb

## 2015-05-14 DIAGNOSIS — R0600 Dyspnea, unspecified: Secondary | ICD-10-CM

## 2015-05-14 MED ORDER — ALBUTEROL SULFATE (2.5 MG/3ML) 0.083% IN NEBU: 2.5000 mg | INHALATION_SOLUTION | Freq: Four times a day (QID) | RESPIRATORY_TRACT | Status: DC | PRN

## 2015-05-14 MED ORDER — ALBUTEROL SULFATE HFA 108 (90 BASE) MCG/ACT IN AERS: INHALATION_SPRAY | RESPIRATORY_TRACT | Status: DC

## 2015-05-14 NOTE — Progress Notes (Signed)
Subjective:    Patient ID: Stefanie Henry, female    DOB: 08-05-1929,   MRN: [id number]    Brief patient profile:  81 yo [place] female speaks Hindi with asthma as child ? Exp to environmental smoke worse sob since  about 2000 required 3 different inhalers seen by [facility] pulmonary eval in [place]  around 2010 rec advair and prn ventolin referred 10/05/14 to pulmonary by DR Shelbie Proctor for sob x late 2015 with nl spirometry while symptomatic 05/14/2015     History of Present Illness  10/05/2014 1st [facility] office visit/ Stefanie Henry   Chief Complaint  Patient presents with  . Pulmonary Consult    Referred by Dr. Shelbie Proctor. Pt c/o SOB for the past 4-5 months. She gets SOB walking from room to room and sometimes when she lies down. She states when she eats it feels like food gets stuck in her chest. She also c/o left side pain.   stopped advair one year prior to OV  Due to throat irritation and continued to use ventolin twice daily not really sure it helps  Breathing worse with voice use  Most nights has trouble lying down for more than 3 hours s pnd takes 3 long breaths and goes back to bed  On pred 10 mg daily per Ennever for cll but makes no difference with resp symptoms  Stops one half flight steps to rest Also limited by arthritis  Did have leg swelling > resolved  rec Pantoprazole (protonix) 40 mg   Take 30-60 min before first meal of the day and Pepcid 20 mg one bedtime until return to office - this is the best way to tell whether stomach acid is contributing to your problem.   GERD diet  Try off albuterol to see what difference but for sure don't use it on return  Please remember to go to the lab and x-ray department downstairs for your tests - we will call you with the results when they are available. Please schedule a follow up office visit in 4 weeks, sooner if needed with pfts with all meds in hand > did not return    05/14/2015  f/u ov/Stefanie Henry re: persistent sob/ now having at  rest too  Chief Complaint  Patient presents with  . Acute Visit    Pt c/o DOE progressively worse since she was here in April 2016.  She states she gets out of breath just walking a few steps. She also c/o heaviness in her chest.    Rarely happens @ rest but is sob @ time of ov ov  Most of the time sleeping fine on 02 newly started   No obvious day to day or daytime variability or assoc chronic cough or cp  Or subjective wheeze or overt sinus or hb symptoms. No unusual exp hx or h/o childhood pna/ asthma or knowledge of premature birth.  Sleeping ok without nocturnal  or early am exacerbation  of respiratory  c/o's or need for noct saba. Also denies any obvious fluctuation of symptoms with weather or environmental changes or other aggravating or alleviating factors except as outlined above   Current Medications, Allergies, Complete Past Medical History, Past Surgical History, Family History, and Social History were reviewed in [employer] record.  ROS  The following are not active complaints unless bolded sore throat, dysphagia, dental problems, itching, sneezing,  nasal congestion or excess/ purulent secretions, ear ache,   fever, chills, sweats, unintended wt loss, classically pleuritic or exertional  cp, hemoptysis,  orthopnea pnd or leg swelling, presyncope, palpitations, abdominal pain, anorexia, nausea, vomiting, diarrhea  or change in bowel or bladder habits, change in stools or urine, dysuria,hematuria,  rash, arthralgias, visual complaints, headache, numbness, weakness or ataxia or problems with walking or coordination,  change in mood/affect or memory.           Objective:   Physical Exam  W/c bound [place] female nad/ walks very slow pace with 4 pronged walker   05/14/2015     127   10/05/14 128 lb (58.06 kg)  08/20/14 127 lb (57.607 kg)  06/13/14 134 lb 4.8 oz (60.918 kg)    Vital signs reviewed  HEENT: nl dentition, turbinates, and orophanx. Nl  external ear canals without cough reflex   NECK :  without JVD/Nodes/TM/ nl carotid upstrokes bilaterally   LUNGS: no acc muscle use, clear to A and P bilaterally without cough on insp or exp maneuvers   CV:  RRR  no s3 or murmur or increase in P2, no edema   ABD:  soft and nontender with nl excursion in the supine position. No bruits or organomegaly, bowel sounds nl  MS:  warm without deformities, calf tenderness, cyanosis or clubbing  SKIN: warm and dry without lesions    NEURO:  alert, approp, no deficits     I personally reviewed images and agree with radiology impression as follows:  CXR: 02/22/15 Borderline heart size. No active disease.  Labs ordered/ reviewed:      Chemistry      Component Value Date/Time   NA 135* 03/29/2015 0908   NA 132* 02/12/2015 1140   NA 129 01/16/2013 0851   K 3.5 03/29/2015 0908   K 4.1 02/12/2015 1140   K 2.7* 01/16/2013 0851   CL 99* 02/12/2015 1140   CL 93* 01/16/2013 0851   CO2 25 03/29/2015 0908   CO2 25 02/12/2015 1140   CO2 29 01/16/2013 0851   BUN 7.9 03/29/2015 0908   BUN 8 02/12/2015 1140   BUN 11 01/16/2013 0851   CREATININE 0.7 03/29/2015 0908   CREATININE 0.53 02/12/2015 1140   CREATININE 0.7 01/16/2013 0851      Component Value Date/Time   CALCIUM 8.6 03/29/2015 0908   CALCIUM 8.5* 02/12/2015 1140   CALCIUM 8.4 01/16/2013 0851   ALKPHOS 63 03/29/2015 0908   ALKPHOS 62 02/12/2015 1140   ALKPHOS 59 01/16/2013 0851   AST 10 03/29/2015 0908   AST 15 02/12/2015 1140   AST 15 01/16/2013 0851   ALT 11 03/29/2015 0908   ALT 13* 02/12/2015 1140   ALT 13 01/16/2013 0851   BILITOT 0.44 03/29/2015 0908   BILITOT 0.3 02/12/2015 1140   BILITOT 0.80 01/16/2013 0851        Lab Results  Component Value Date   WBC 48.9* 03/29/2015   HGB 10.6* 03/29/2015   HCT 33.5* 03/29/2015   MCV 74* 03/29/2015   PLT 100* 03/29/2015     Lab Results  Component Value Date   DDIMER 0.55* 02/12/2015      Lab Results    Component Value Date   TSH 0.65 10/05/2014     Lab Results  Component Value Date   PROBNP 81.0 10/05/2014                           Assessment & Plan:

## 2015-05-14 NOTE — Patient Instructions (Addendum)
There is no evidence of a lung problem here  I would welcome a second opnion by one of our other providers,  Dr Lorine Bears to continue using 02 in meantime at bedtime and as needed during the day

## 2015-05-19 ENCOUNTER — Encounter: Payer: Self-pay | Admitting: Internal Medicine

## 2015-05-19 NOTE — Assessment & Plan Note (Addendum)
-   10/05/2014   Walked RA x one lap @ 185 stopped due to fatigue >> sob/ no desats/ slow pace - Spirometry 10/05/2014 > min airflow obst   Although she does have mild airflow obstruction on spirometry it is not evident clinically and note that all of her symptoms go away when she puts  on oxygen regardless of its where there is during the day or at bedtime and note without oxygen today she's 97% on room air.   Addition, she has been on prednisone for CLL which has not apparently improved her breathing, which would be suspected in a patient with  any underlying asthma or allergy.  She also  has had several BNPs on the chart they're perfectly normal during flares of her dyspnea so doubt this is occult chf.   Improvement on oxygen even when she is on 97% on RA  is suggestive anxiety which is very difficult to tease out from her history and all of her history comes directly through her son as an interpreter.  I therefore suggested a second opinion from one of our pulmonary providers who speaks her native language, Dr. Elsworth Soho, who agreed to see her.  In meantime no change in rx needed  I had an extended discussion with the patient thru her son  reviewing all relevant studies completed to date and  lasting 15 to 20 minutes of a 25 minute visit    Each maintenance medication was reviewed in detail including most importantly the difference between maintenance and prns and under what circumstances the prns are to be triggered using an action plan format that is not reflected in the computer generated alphabetically organized AVS.    Please see instructions for details which were reviewed in writing and the patient given a copy highlighting the part that I personally wrote and discussed at today's ov.

## 2015-05-20 ENCOUNTER — Other Ambulatory Visit (INDEPENDENT_AMBULATORY_CARE_PROVIDER_SITE_OTHER): Payer: Medicare Other

## 2015-05-20 ENCOUNTER — Telehealth: Payer: Self-pay | Admitting: Gastroenterology

## 2015-05-20 DIAGNOSIS — R1032 Left lower quadrant pain: Secondary | ICD-10-CM

## 2015-05-20 LAB — COMPREHENSIVE METABOLIC PANEL
ALK PHOS: 67 U/L (ref 39–117)
ALT: 12 U/L (ref 0–35)
AST: 13 U/L (ref 0–37)
Albumin: 3.8 g/dL (ref 3.5–5.2)
BUN: 10 mg/dL (ref 6–23)
CO2: 28 meq/L (ref 19–32)
Calcium: 8.6 mg/dL (ref 8.4–10.5)
Chloride: 98 mEq/L (ref 96–112)
Creatinine, Ser: 0.57 mg/dL (ref 0.40–1.20)
GFR: 107.44 mL/min (ref >60.00)
GLUCOSE: 140 mg/dL — AB (ref 70–99)
POTASSIUM: 3.9 meq/L (ref 3.5–5.1)
Sodium: 133 mEq/L — ABNORMAL LOW (ref 135–145)
TOTAL PROTEIN: 5.8 g/dL — AB (ref 6.0–8.3)
Total Bilirubin: 0.4 mg/dL (ref 0.2–1.2)

## 2015-05-20 LAB — LIPASE: Lipase: 26 U/L (ref 11.0–59.0)

## 2015-05-20 NOTE — Telephone Encounter (Signed)
Patient with a history of complicated diverticulitis.  She has a 4 day history of worsening LLQ pain.  She does report low grade fever tmax 99.6.  She has had 2 episodes of rectal bleeding since her last office visit in October.  Dr. Fuller Plan I do not have any APP or openings with you this week.  Please advise

## 2015-05-20 NOTE — Telephone Encounter (Signed)
Patient is allergic to contrast.  Discussed with her Dr. Fuller Plan.  She will have CT without IV contrast.  She will come for lab work today and CT is scheduled for tomorrow at 4:30.  Son is notified of the instructions.  He will bring her for labs and come pick up oral contrast and instructions.

## 2015-05-20 NOTE — Telephone Encounter (Signed)
CBC, CMP, lipase Abd/pelvic CT

## 2015-05-21 ENCOUNTER — Ambulatory Visit (HOSPITAL_BASED_OUTPATIENT_CLINIC_OR_DEPARTMENT_OTHER)
Admission: RE | Admit: 2015-05-21 | Discharge: 2015-05-21 | Disposition: A | Discharge status: Home / Self Care | Payer: Medicare Other | Source: Ambulatory Visit | Attending: Gastroenterology | Admitting: Gastroenterology

## 2015-05-21 ENCOUNTER — Other Ambulatory Visit: Payer: Self-pay

## 2015-05-21 ENCOUNTER — Telehealth: Payer: Self-pay | Admitting: Gastroenterology

## 2015-05-21 ENCOUNTER — Other Ambulatory Visit: Payer: Medicare Other

## 2015-05-21 DIAGNOSIS — R197 Diarrhea, unspecified: Secondary | ICD-10-CM | POA: Diagnosis not present

## 2015-05-21 DIAGNOSIS — K5730 Diverticulosis of large intestine without perforation or abscess without bleeding: Secondary | ICD-10-CM | POA: Diagnosis not present

## 2015-05-21 DIAGNOSIS — R1032 Left lower quadrant pain: Secondary | ICD-10-CM | POA: Insufficient documentation

## 2015-05-21 DIAGNOSIS — R161 Splenomegaly, not elsewhere classified: Secondary | ICD-10-CM | POA: Insufficient documentation

## 2015-05-21 DIAGNOSIS — D259 Leiomyoma of uterus, unspecified: Secondary | ICD-10-CM | POA: Insufficient documentation

## 2015-05-21 DIAGNOSIS — K5732 Diverticulitis of large intestine without perforation or abscess without bleeding: Secondary | ICD-10-CM | POA: Diagnosis not present

## 2015-05-21 LAB — CBC WITH DIFFERENTIAL/PLATELET
BASOS PCT: 0.4 % (ref 0.0–3.0)
Basophils Absolute: 0.1 10*3/uL (ref 0.0–0.1)
EOS PCT: 0.2 % (ref 0.0–5.0)
Eosinophils Absolute: 0.1 10*3/uL (ref 0.0–0.7)
HCT: 35.8 % — ABNORMAL LOW (ref 36.0–46.0)
Hemoglobin: 11.4 g/dL — ABNORMAL LOW (ref 12.0–15.0)
LYMPHS ABS: 29.1 10*3/uL — AB (ref 0.7–4.0)
MCHC: 31.8 g/dL (ref 30.0–36.0)
MCV: 74.3 fl — AB (ref 78.0–100.0)
MONOS PCT: 3 % (ref 3.0–12.0)
Monocytes Absolute: 1 10*3/uL (ref 0.1–1.0)
NEUTROS ABS: 3 10*3/uL (ref 1.4–7.7)
NEUTROS PCT: 9 % — AB (ref 43.0–77.0)
PLATELETS: 113 10*3/uL — AB (ref 150.0–400.0)
RBC: 4.81 Mil/uL (ref 3.87–5.11)
RDW: 14.1 % (ref 11.5–15.5)

## 2015-05-21 NOTE — Telephone Encounter (Signed)
No answer/ machine.  I will try and reach patient at a later time.

## 2015-05-21 NOTE — Telephone Encounter (Signed)
Labs have not resulted from yesterday. Spoke to the lab and their CBC machine is broken, repair man is there now.  Should result this afternoon.

## 2015-05-21 NOTE — Telephone Encounter (Signed)
See labs and imaging studies for additional details.

## 2015-05-22 NOTE — Telephone Encounter (Signed)
Left message to call back  

## 2015-05-22 NOTE — Telephone Encounter (Signed)
Pts son went to [facility] to pick up kit for labs. [facility] not sure what to give pt. [facility] will call [facility] to see if there is something they can give the pt for the labs.

## 2015-05-24 ENCOUNTER — Other Ambulatory Visit: Payer: Medicare Other

## 2015-05-24 DIAGNOSIS — R197 Diarrhea, unspecified: Secondary | ICD-10-CM | POA: Diagnosis not present

## 2015-05-28 ENCOUNTER — Telehealth: Payer: Self-pay | Admitting: Gastroenterology

## 2015-05-28 ENCOUNTER — Encounter: Payer: Self-pay | Admitting: Gastroenterology

## 2015-05-28 NOTE — Telephone Encounter (Signed)
I reported to the patient's daughter-in-law that pathogen panel is still pending.  She reports that the patient is not having diarrhea but is having 4 stools a day.  She reports that she has not had any rectal bleeding for 2 days.  The patient reports abdominal pain.  Daughter-in-law also reports that she is c/o upper abdominal tightness, SOB, and lethargy.  I advised that daughter-in-law she should be evaluated today by her primary care or at Urgent Care.  She is given an appt for Thursday with Cecille Rubin Hvozdovic, PA at 2:15.  I again recommended with her symptoms of upper abdominal "tightness" and SOB be evaluated today.

## 2015-05-28 NOTE — Telephone Encounter (Signed)
Patient's son called back to cancel her appt for Thursday.  They are wanting the results of the GI pathogen panel.  He want to know what will be done about the rectal bleeding. I discussed with him that GI pathogen panel will not evaluate her rectal bleeding.  He is advised she should see her primary care tomorrow and then keep the appt for [employer], PA on Thursday.  Son wants to cancel and wait for the results of the stool tests and our recommendations.  I explained again that the rectal bleeding needs to be evaluated and she should keep the appt for Thursday.  I advised that if she cancels that the next appt will not be till the end of December.  He cancelled the appt.

## 2015-05-28 NOTE — Telephone Encounter (Signed)
She should see her PCP or at Urgent Care before returning to see [place]. Saw Dr Melvyn Novas 2 weeks ago for dyspnea. Had abd/pelvic CT and blood work last week. Has declined colonoscopy recently.

## 2015-05-28 NOTE — Telephone Encounter (Signed)
Patient's daughter-in-law notified They will take her to primary care

## 2015-05-30 ENCOUNTER — Ambulatory Visit: Payer: Medicare Other | Admitting: Physician Assistant

## 2015-05-30 LAB — GASTROINTESTINAL PATHOGEN PANEL PCR
C. DIFFICILE TOX A/B, PCR: NEGATIVE
Campylobacter, PCR: NEGATIVE
Cryptosporidium, PCR: NEGATIVE
E COLI (ETEC) LT/ST, PCR: NEGATIVE
E COLI (STEC) STX1/STX2, PCR: NEGATIVE
E coli 0157, PCR: NEGATIVE
Giardia lamblia, PCR: NEGATIVE
Norovirus, PCR: NEGATIVE
ROTAVIRUS, PCR: NEGATIVE
SALMONELLA, PCR: NEGATIVE
Shigella, PCR: NEGATIVE

## 2015-06-03 ENCOUNTER — Telehealth: Payer: Self-pay | Admitting: Gastroenterology

## 2015-06-03 NOTE — Telephone Encounter (Signed)
Patient dismissed from [facility] by Lucio Edward MD , effective May 28, 2015. Dismissal letter sent out by certified / registered mail.  DAJ  Received signed domestic return receipt verifying delivery of certified letter on June 10, 2015. Article number [id number]

## 2015-06-05 ENCOUNTER — Other Ambulatory Visit: Payer: Self-pay | Admitting: Hematology & Oncology

## 2015-06-19 ENCOUNTER — Other Ambulatory Visit: Payer: Self-pay | Admitting: Medical

## 2015-06-19 ENCOUNTER — Other Ambulatory Visit: Payer: Self-pay | Admitting: Family Medicine

## 2015-06-28 DIAGNOSIS — B9689 Other specified bacterial agents as the cause of diseases classified elsewhere: Secondary | ICD-10-CM | POA: Diagnosis not present

## 2015-06-28 DIAGNOSIS — J329 Chronic sinusitis, unspecified: Secondary | ICD-10-CM | POA: Diagnosis not present

## 2015-07-10 ENCOUNTER — Other Ambulatory Visit: Payer: Self-pay | Admitting: Internal Medicine

## 2015-07-10 ENCOUNTER — Other Ambulatory Visit: Payer: Self-pay | Admitting: Hematology & Oncology

## 2015-07-10 ENCOUNTER — Other Ambulatory Visit: Payer: Self-pay | Admitting: Medical

## 2015-07-10 ENCOUNTER — Other Ambulatory Visit: Payer: Self-pay | Admitting: Family Medicine

## 2015-07-10 NOTE — Telephone Encounter (Signed)
Refill request for drop. I denied the rx. What is going on clinically. Has been while since I saw her she may need appointment? Need more information before I write rx.

## 2015-07-12 NOTE — Telephone Encounter (Signed)
Called patient per son will have to check and see which medication needed refilling. States he will call pharmacy to find out. Advised it has been a while since seen by Percell Miller so they may need to schedule appointment. Son voiced understanding.

## 2015-08-06 ENCOUNTER — Other Ambulatory Visit: Payer: Self-pay | Admitting: Hematology & Oncology

## 2015-08-09 ENCOUNTER — Encounter: Payer: Medicare Other | Admitting: Medical

## 2015-08-09 NOTE — Progress Notes (Signed)
This encounter was created in error - please disregard.

## 2015-08-10 ENCOUNTER — Ambulatory Visit (HOSPITAL_COMMUNITY)
Admission: RE | Admit: 2015-08-10 | Discharge: 2015-08-10 | Disposition: A | Discharge status: Home / Self Care | Payer: Medicare Other | Source: Ambulatory Visit | Attending: Internal Medicine | Admitting: Internal Medicine

## 2015-08-10 ENCOUNTER — Telehealth: Payer: Self-pay | Admitting: *Deleted

## 2015-08-10 ENCOUNTER — Emergency Department (HOSPITAL_COMMUNITY)
Admission: EM | Admit: 2015-08-10 | Discharge: 2015-08-10 | Disposition: A | Discharge status: Other Acute Inpatient Hospital | Payer: Medicare Other

## 2015-08-10 ENCOUNTER — Emergency Department (HOSPITAL_COMMUNITY)
Admission: EM | Admit: 2015-08-10 | Discharge: 2015-08-10 | Disposition: A | Discharge status: Home / Self Care | Payer: Medicare Other | Source: Home / Self Care | Attending: Internal Medicine | Admitting: Internal Medicine

## 2015-08-10 ENCOUNTER — Encounter: Payer: Self-pay | Admitting: Internal Medicine

## 2015-08-10 ENCOUNTER — Ambulatory Visit (INDEPENDENT_AMBULATORY_CARE_PROVIDER_SITE_OTHER): Payer: Medicare Other | Admitting: Internal Medicine

## 2015-08-10 VITALS — BP 118/66 | HR 98 | Temp 100.4°F | Resp 22

## 2015-08-10 DIAGNOSIS — R54 Age-related physical debility: Secondary | ICD-10-CM | POA: Diagnosis not present

## 2015-08-10 DIAGNOSIS — R05 Cough: Secondary | ICD-10-CM | POA: Diagnosis not present

## 2015-08-10 DIAGNOSIS — J989 Respiratory disorder, unspecified: Secondary | ICD-10-CM | POA: Diagnosis not present

## 2015-08-10 DIAGNOSIS — Z20828 Contact with and (suspected) exposure to other viral communicable diseases: Secondary | ICD-10-CM

## 2015-08-10 DIAGNOSIS — R0989 Other specified symptoms and signs involving the circulatory and respiratory systems: Secondary | ICD-10-CM | POA: Insufficient documentation

## 2015-08-10 DIAGNOSIS — R509 Fever, unspecified: Principal | ICD-10-CM

## 2015-08-10 DIAGNOSIS — R918 Other nonspecific abnormal finding of lung field: Secondary | ICD-10-CM | POA: Diagnosis not present

## 2015-08-10 DIAGNOSIS — R6889 Other general symptoms and signs: Secondary | ICD-10-CM

## 2015-08-10 DIAGNOSIS — C9110 Chronic lymphocytic leukemia of B-cell type not having achieved remission: Secondary | ICD-10-CM

## 2015-08-10 DIAGNOSIS — IMO0001 Reserved for inherently not codable concepts without codable children: Secondary | ICD-10-CM

## 2015-08-10 MED ORDER — OSELTAMIVIR PHOSPHATE 75 MG PO CAPS: 75.0000 mg | ORAL_CAPSULE | Freq: Two times a day (BID) | ORAL | Status: DC

## 2015-08-10 NOTE — Telephone Encounter (Signed)
Spoke to pt;s daughter in-law Rache, told her Chest x-ray was stable no pneumonia per Dr. Regis Bill and to start the Tamiflu. Rache verbalized understanding and will tell pt.

## 2015-08-10 NOTE — Progress Notes (Signed)
Chief Complaint  Patient presents with  . Cough    expectorating white  . chest congestion  . Wheezing    HPI: Patient comes in today for [facility] Saturday clinic for  new problem evaluation. Here with son blePCP sagu[place] hs seen pulmoary dr Melvyn Novas in november for dypsnea  With mild airflow obstruction Has cll last seen pcp September  Has had flu vaccine Onset 36-48 hours of above with Cough breathing issues  .  Fever  White  phlelgm .  Recovering from flu. Son .   Had pos test took tamiflu . Has albuterol to use as needed  Fluids ok   Wants to know about hearing that is decreasing over time  No bleeding fall  ROS: See pertinent positives and negatives per [email or web address].no  Past Medical History  Diagnosis Date  . Asthma   . Arthritis   . Shingles   . Anemia, iron deficiency 06/06/2012  . Chronic lymphatic leukemia ([place])   . Gallstones   . Diverticulitis   . COPD (chronic obstructive pulmonary disease) ([place])   . Cataract   . Blood transfusion without reported diagnosis   . GERD (gastroesophageal reflux disease)   . Neuromuscular disorder ([place])     Family History  Problem Relation Age of Onset  . Diabetes Father   . Stomach cancer Sister   . Colon cancer Neg Hx   . Colon polyps Neg Hx   . Esophageal cancer Neg Hx   . Gallbladder disease Mother     Social History   Social History  . Marital Status: Married    Spouse Name: N/A  . Number of Children: 2  . Years of Education: N/A   Occupational History  . Retired    Social History Main Topics  . Smoking status: Never Smoker   . Smokeless tobacco: Never Used     Comment: PT NEVER SMOKED  . Alcohol Use: No  . Drug Use: No  . Sexual Activity: Not Asked   Other Topics Concern  . None   Social History Narrative    Outpatient Prescriptions Prior to Visit  Medication Sig Dispense Refill  . albuterol (PROVENTIL) (2.5 MG/3ML) 0.083% nebulizer solution Take 3 mLs (2.5 mg total) by nebulization every 6 (six) hours as needed  for wheezing or shortness of breath. 75 mL 5  . calcitonin, salmon, (MIACALCIN/FORTICAL) 200 UNIT/ACT nasal spray Place 1 spray into alternate nostrils daily. 3.7 mL 12  . gabapentin (NEURONTIN) 100 MG capsule TAKE 2 CAPSULES BY MOUTH 3 TIMES A DAY AS NEEDED FOR NERVE PAIN 180 capsule 3  . hyoscyamine (LEVSIN SL) 0.125 MG SL tablet PLACE 1 TABLET (0.125 MG TOTAL) UNDER THE TONGUE EVERY 8 (EIGHT) HOURS AS NEEDED. 60 tablet 11  . magic mouthwash SOLN 5 ml po qid swish and spit. 200 mL 0  . NEOMYCIN-POLYMYXIN-HYDROCORTISONE (CORTISPORIN) 1 % SOLN otic solution PLACE 3 DROPS INTO THE LEFT EAR EVERY 8 (EIGHT) HOURS. 10 mL 0  . pantoprazole (PROTONIX) 40 MG tablet Take 1 tablet (40 mg total) by mouth daily. 30 tablet 11  . predniSONE (DELTASONE) 10 MG tablet TAKE 1 TABLET BY MOUTH EVERY DAY 30 tablet 0  . saccharomyces boulardii (FLORASTOR) 250 MG capsule Take 1 capsule (250 mg total) by mouth 2 (two) times daily. 60 capsule 1  . senna (SENOKOT) 8.6 MG TABS tablet Take 1-2 tablets by mouth daily as needed for mild constipation.    . Simethicone (GAS-X PO) Take 2 capsules by mouth daily.     Marland Kitchen  sucralfate (CARAFATE) 1 G tablet TAKE 2 TABLETS IN THE MORNING AND AT BEDTIME 120 tablet 11  . VENTOLIN HFA 108 (90 Base) MCG/ACT inhaler INHALE 1-2 PUFFS INTO THE LUNGS EVERY 6 (SIX) HOURS AS NEEDED FOR WHEEZING OR SHORTNESS OF BREATH. 1 Inhaler 0  . fluconazole (DIFLUCAN) 100 MG tablet Take 1 tablet (100 mg total) by mouth daily. 7 tablet 0   No facility-administered medications prior to visit.     EXAM:  BP 118/66 mmHg  Pulse 98  Temp(Src) 100.4 F (38 C) (Oral)  Resp 22  SpO2 95%  There is no weight on file to calculate BMI.  GENERAL: vitals reviewed and listed above, elderly frail in wc alert follows direction  Son help with interpretation alert, oriented, appears  in no acute distress  Mild ill ocass cough  In   [facility]  with son.  HEENT: atraumatic, conjunctiva  clear, no obvious abnormalities on  inspection of external nose and ears tm intact  nad   OP : no lesion edema or exudate   NECK: no obvious masses on inspection palpation  LUNGS: ocass  Exp wheeze no rales  CV: HRRR, no clubbing cyanosis or nl cap refill  MS: moves all extremities Abnormality oa   Conversant with son skin nl cap refill   Lab Results  Component Value Date   WBC 33.2 Repeated and verified X2.* 05/20/2015   HGB 11.4* 05/20/2015   HCT 35.8* 05/20/2015   PLT 113.0* 05/20/2015   GLUCOSE 140* 05/20/2015   ALT 12 05/20/2015   AST 13 05/20/2015   NA 133* 05/20/2015   K 3.9 05/20/2015   CL 98 05/20/2015   CREATININE 0.57 05/20/2015   BUN 10 05/20/2015   CO2 28 05/20/2015   TSH 0.65 10/05/2014    ASSESSMENT AND PLAN:  Discussed the following assessment and plan:  Febrile respiratory illness - Plan: DG Chest 2 View  Flu-like symptoms  Exposure to the flu - Plan: DG Chest 2 View  Age factor prob influenza    R/o pna    Because of risk and fever  Had immuniz but pos test  recently rxed .    Expectant management. Will contact about x ray  Can leave message or my chart    -Patient advised to return or notify health care team  if symptoms worsen ,persist or new concerns arise.  Patient Instructions  This is probably  Influenza infection   And can begin tamiflu .    use albuterol  As needed .  But get chest x ray to make sure no  Bacterial pneumonia that needs other treatment . Go to [place] long entrance and ask to check in  ED entrance  For getting x ray ordered  Will let  You know when results are back  And can check on my chart also .   Get your PCP to decide on evaluation for hearing .   Fever should be gone  In  2 days or so    [employer]. Panosh M.D.

## 2015-08-10 NOTE — Progress Notes (Signed)
Pre visit review using our clinic review tool, if applicable. No additional management support is needed unless otherwise documented below in the visit note. 

## 2015-08-10 NOTE — Patient Instructions (Addendum)
This is probably  Influenza infection   And can begin tamiflu .    use albuterol  As needed .  But get chest x ray to make sure no  Bacterial pneumonia that needs other treatment . Go to [place] long entrance and ask to check in  ED entrance  For getting x ray ordered  Will let  You know when results are back  And can check on my chart also .   Get your PCP to decide on evaluation for hearing .   Fever should be gone  In  2 days or so

## 2015-08-11 ENCOUNTER — Other Ambulatory Visit: Payer: Self-pay | Admitting: Medical

## 2015-08-11 ENCOUNTER — Other Ambulatory Visit: Payer: Self-pay | Admitting: Family Medicine

## 2015-08-12 ENCOUNTER — Telehealth: Payer: Self-pay | Admitting: Medical

## 2015-08-12 NOTE — Telephone Encounter (Signed)
No show. She came late and I could not see her. So she went to Saturday clinic.

## 2015-08-12 NOTE — Telephone Encounter (Signed)
Ptwas no show 08/09/15 2:45pm for acute appt, pt went to [facility] 08/10/15 Saturday Clinic and was sent to [facility] for imaging, charge or no charge?

## 2015-08-14 DIAGNOSIS — J209 Acute bronchitis, unspecified: Secondary | ICD-10-CM | POA: Diagnosis not present

## 2015-08-14 DIAGNOSIS — R509 Fever, unspecified: Secondary | ICD-10-CM | POA: Diagnosis not present

## 2015-08-14 DIAGNOSIS — R0602 Shortness of breath: Secondary | ICD-10-CM | POA: Diagnosis not present

## 2015-08-15 ENCOUNTER — Other Ambulatory Visit: Payer: Medicare Other

## 2015-08-27 ENCOUNTER — Ambulatory Visit (INDEPENDENT_AMBULATORY_CARE_PROVIDER_SITE_OTHER): Payer: Medicare Other | Admitting: Pulmonary Disease

## 2015-08-27 ENCOUNTER — Encounter: Payer: Self-pay | Admitting: Pulmonary Disease

## 2015-08-27 VITALS — BP 100/80 | HR 93 | Ht <= 58 in | Wt 127.0 lb

## 2015-08-27 DIAGNOSIS — R0600 Dyspnea, unspecified: Secondary | ICD-10-CM | POA: Diagnosis not present

## 2015-08-27 DIAGNOSIS — J4540 Moderate persistent asthma, uncomplicated: Secondary | ICD-10-CM | POA: Diagnosis not present

## 2015-08-27 HISTORY — DX: Moderate persistent asthma, uncomplicated: J45.40

## 2015-08-27 NOTE — Assessment & Plan Note (Addendum)
She does have a long-standing history of asthma and we will treat her as such. Spirometry does show moderate severe degree of airway obstruction-although it is not clear to me whether this would be reversible or not at this point. This may very well represent chronic airway obstruction. Albuterol seems to have provided her the most relief and we will persist with this  Take albuterol nebs twice daily # 60 x 3 refills Trial of Dulera 1 puff twice daily - call for Rx if this works

## 2015-08-27 NOTE — Assessment & Plan Note (Signed)
Oxygen seems to provide relief. She was provided oxygen on a hospital discharge in 10/2014 Dyspnea appears to be multifactorial-with deconditioning and anemia also contributing

## 2015-08-27 NOTE — Progress Notes (Signed)
   Subjective:    Patient ID: Stefanie Henry, female    DOB: 04/23/1930, 82 y.o.   MRN: [id number]  HPI  82 yo [place] never smoker speaks Hindi , presents for a second opinion about her dyspnea Her son accompanies and provides additional history. She reports asthma since childhood, but never required maintenance medications, worse as an adult and especially after diagnosis of CLL in 2000. Her last exacerbation was in 2004. She was evaluated by pulmonologist in [place] and placed on Advair and Ventolin  She was hospitalized in 2016 to [facility] for diverticulitis and placed on oxygen on discharge She has been maintained on pred 10 mg daily per Ennever for cll but makes no difference with resp symptoms   She does not seem to have significant anxiety symptoms of chest pain or palpitations. Albuterol nebs helped the most, but this was cut down to once daily. Symbicort did not provide much benefit. She takes Advair on an as-needed basis. Oxygen seems to help-when she has dyspnea on exertion. Review of labs shows anemia with hemoglobin in the 10-11 range She does report mild pedal edema and arthritic symptoms in her knees  Significant tests/ events Echo 09/2014-normal LV function, grade 1 diastolic dysfunction CT abdomen 04/2015 bases of lungs appear clear Chest x-ray 07/2015 suggests right middle lobe scarring  Spirometry 04/2015-ratio 66, FEV1 44% and FVC 49%  Past Medical History  Diagnosis Date  . Asthma   . Arthritis   . Shingles   . Anemia, iron deficiency 06/06/2012  . Chronic lymphatic leukemia ([place])   . Gallstones   . Diverticulitis   . COPD (chronic obstructive pulmonary disease) ([place])   . Cataract   . Blood transfusion without reported diagnosis   . GERD (gastroesophageal reflux disease)   . Neuromuscular disorder ([place])      Review of Systems neg for any significant sore throat, dysphagia, itching, sneezing, nasal congestion or excess/ purulent secretions, fever, chills,  sweats, unintended wt loss, pleuritic or exertional cp, hempoptysis, orthopnea pnd or change in chronic leg swelling.  Also denies presyncope, palpitations, heartburn, abdominal pain, nausea, vomiting, diarrhea or change in bowel or urinary habits, dysuria,hematuria, rash, arthralgias, visual complaints, headache, numbness weakness or ataxia.     Objective:   Physical Exam  Gen. Pleasant, elderly well-nourished, in no distress, normal affect ENT - no lesions, no post nasal drip, nasal cannula Neck: No JVD, no thyromegaly, no carotid bruits Lungs: no use of accessory muscles, no dullness to percussion, clear without rales or rhonchi  Cardiovascular: Rhythm regular, heart sounds  normal, no murmurs or gallops, 1+ peripheral edema Abdomen: soft and non-tender, no hepatosplenomegaly, BS normal. Musculoskeletal: No deformities, no cyanosis or clubbing Neuro:  alert, non focal        Assessment & Plan:

## 2015-08-27 NOTE — Patient Instructions (Signed)
Take albuterol nebs twice daily # 60 x 3 refills Trial of Dulera 1 puff twice daily - call for Rx if this works We will try to certify you for oxygen

## 2015-08-30 ENCOUNTER — Telehealth: Payer: Self-pay | Admitting: Pulmonary Disease

## 2015-08-30 DIAGNOSIS — J4540 Moderate persistent asthma, uncomplicated: Secondary | ICD-10-CM

## 2015-08-30 MED ORDER — ALBUTEROL SULFATE (2.5 MG/3ML) 0.083% IN NEBU: 2.5000 mg | INHALATION_SOLUTION | Freq: Two times a day (BID) | RESPIRATORY_TRACT | Status: DC

## 2015-08-30 NOTE — Telephone Encounter (Signed)
Spoke with pt's son, Maurene Capes. Pt needs her albuterol nebs refilled along with a new neb machine. Both have been ordered. Nothing further was needed.

## 2015-09-18 ENCOUNTER — Other Ambulatory Visit: Payer: Self-pay | Admitting: Medical

## 2015-09-18 ENCOUNTER — Other Ambulatory Visit: Payer: Self-pay | Admitting: Hematology & Oncology

## 2015-10-01 ENCOUNTER — Ambulatory Visit (HOSPITAL_BASED_OUTPATIENT_CLINIC_OR_DEPARTMENT_OTHER): Payer: Medicare Other | Admitting: Family

## 2015-10-01 ENCOUNTER — Encounter: Payer: Self-pay | Admitting: Family

## 2015-10-01 ENCOUNTER — Encounter: Payer: Self-pay | Admitting: *Deleted

## 2015-10-01 ENCOUNTER — Other Ambulatory Visit (HOSPITAL_BASED_OUTPATIENT_CLINIC_OR_DEPARTMENT_OTHER): Payer: Medicare Other

## 2015-10-01 ENCOUNTER — Telehealth: Payer: Self-pay | Admitting: *Deleted

## 2015-10-01 VITALS — BP 109/52 | HR 81 | Temp 98.6°F | Resp 14 | Ht <= 58 in | Wt 131.0 lb

## 2015-10-01 DIAGNOSIS — D509 Iron deficiency anemia, unspecified: Secondary | ICD-10-CM | POA: Diagnosis present

## 2015-10-01 DIAGNOSIS — C9110 Chronic lymphocytic leukemia of B-cell type not having achieved remission: Secondary | ICD-10-CM | POA: Diagnosis not present

## 2015-10-01 LAB — CBC WITH DIFFERENTIAL (CANCER CENTER ONLY)
HEMATOCRIT: 34.1 % — AB (ref 34.8–46.6)
HGB: 11 g/dL — ABNORMAL LOW (ref 11.6–15.9)
MCH: 23.2 pg — AB (ref 26.0–34.0)
MCHC: 32.3 g/dL (ref 32.0–36.0)
MCV: 72 fL — AB (ref 81–101)
PLATELETS: 108 10*3/uL — AB (ref 145–400)
RBC: 4.75 10*6/uL (ref 3.70–5.32)
RDW: 14.9 % (ref 11.1–15.7)
WBC: 55.9 10*3/uL (ref 3.9–10.0)

## 2015-10-01 LAB — IRON AND TIBC
%SAT: 38 % (ref 21–57)
Iron: 110 ug/dL (ref 41–142)
TIBC: 290 ug/dL (ref 236–444)
UIBC: 180 ug/dL (ref 120–384)

## 2015-10-01 LAB — MANUAL DIFFERENTIAL (CHCC SATELLITE)
ALC: 51.4 10*3/uL — AB (ref 0.6–2.2)
ANC (CHCC MAN DIFF): 3.4 10*3/uL (ref 1.5–6.7)
BASO: 1 % (ref 0–2)
LYMPH: 92 % — ABNORMAL HIGH (ref 14–48)
MONO: 1 % (ref 0–13)
PLATELET MORPHOLOGY: NORMAL
PLT EST ~~LOC~~: DECREASED
SEG: 6 % — ABNORMAL LOW (ref 40–75)

## 2015-10-01 LAB — CHCC SATELLITE - SMEAR

## 2015-10-01 LAB — FERRITIN: Ferritin: 320 ng/ml — ABNORMAL HIGH (ref 9–269)

## 2015-10-01 NOTE — Progress Notes (Signed)
Hematology and Oncology Follow Up Visit  Stefanie Henry [id number] 11-24-1929 82 y.o. 10/01/2015   Principle Diagnosis:  Chronic lymphocytic leukemia Iron deficiency anemia  Current Therapy:   Observation    Interim History:  Stefanie Henry is here today with her son for follow-up. She is feeling fatigued and weak. She is not active at home and spends most of her day sitting. She has SOB with exertion and is on 2L supplemental O2 as needed at home. She seems to be quite deconditioned.  Her WBC count continues to be elevated at 55.9. She has had no problem with infections. She received 1 treatment for the CLL over 4 years ago but did not tolerate it well and stopped.  No fever, chills, n/v, cough, rash, dizziness, chest pain, palpitations, abdominal pain or changes in bowel or bladder habits. She goes back and forth between constpation and diarrhea. She has a history of diverticulitis. Her last flare was over a year ago.   No lymphadenopathy found on exam. No episodes of bleeding or bruising.  No swelling, tenderness, numbness or tingling in her extremities. She states that her feet are "restless" at times. No recent falls or syncopal episodes.  Her appetite comes and goes. She is staying well hydrated. Her weight is stable.   Medications:    Medication List       This list is accurate as of: 10/01/15 11:35 AM.  Always use your most recent med list.               ADVAIR DISKUS 250-50 MCG/DOSE Aepb  Generic drug:  Fluticasone-Salmeterol  INHAL 1 PUFF ONCE DAILY     calcitonin (salmon) 200 UNIT/ACT nasal spray  Commonly known as:  MIACALCIN/FORTICAL  Place 1 spray into alternate nostrils daily.     gabapentin 100 MG capsule  Commonly known as:  NEURONTIN  TAKE 2 CAPSULES BY MOUTH 3 TIMES A DAY AS NEEDED FOR NERVE PAIN     GAS-X PO  Take 2 capsules by mouth daily.     hyoscyamine 0.125 MG SL tablet  Commonly known as:  LEVSIN SL  PLACE 1 TABLET (0.125 MG TOTAL) UNDER THE TONGUE  EVERY 8 (EIGHT) HOURS AS NEEDED.     magic mouthwash Soln  5 ml po qid swish and spit.     NEOMYCIN-POLYMYXIN-HYDROCORTISONE 1 % Soln otic solution  Commonly known as:  CORTISPORIN  PLACE 3 DROPS INTO THE LEFT EAR EVERY 8 (EIGHT) HOURS.     oseltamivir 75 MG capsule  Commonly known as:  TAMIFLU  Take 1 capsule (75 mg total) by mouth 2 (two) times daily.     pantoprazole 40 MG tablet  Commonly known as:  PROTONIX  Take 1 tablet (40 mg total) by mouth daily.     predniSONE 10 MG tablet  Commonly known as:  DELTASONE  TAKE 1 TABLET BY MOUTH EVERY DAY     saccharomyces boulardii 250 MG capsule  Commonly known as:  FLORASTOR  Take 1 capsule (250 mg total) by mouth 2 (two) times daily.     senna 8.6 MG Tabs tablet  Commonly known as:  SENOKOT  Take 1-2 tablets by mouth daily as needed for mild constipation.     sucralfate 1 g tablet  Commonly known as:  CARAFATE  TAKE 2 TABLETS IN THE MORNING AND AT BEDTIME     VENTOLIN HFA 108 (90 Base) MCG/ACT inhaler  Generic drug:  albuterol  INHALE 1-2 PUFFS INTO THE LUNGS EVERY 6 (SIX)  HOURS AS NEEDED FOR WHEEZING OR SHORTNESS OF BREATH.     albuterol (2.5 MG/3ML) 0.083% nebulizer solution  Commonly known as:  PROVENTIL  Take 3 mLs (2.5 mg total) by nebulization 2 (two) times daily.     PROAIR HFA 108 (90 Base) MCG/ACT inhaler  Generic drug:  albuterol  INHALE 1-2 PUFFS INTO THE LUNGS EVERY 6 (SIX) HOURS AS NEEDED FOR WHEEZING OR SHORTNESS OF BREATH.        Allergies:  Allergies  Allergen Reactions  . Contrast Media [Iodinated Diagnostic Agents] Other (See Comments)    Stomach discomfort  . Fruit & Vegetable Daily [Nutritional Supplements] Other (See Comments)    Citrus fruit causes stomach discomfort  . Ibuprofen Other (See Comments)    Stomach discomfort  . Other     Cannot take any antibiotics, causes rash    Past Medical History, Surgical history, Social history, and Family History were reviewed and updated.  Review  of Systems: All other 10 point review of systems is negative.   Physical Exam:  height is 4\' 9"  (1.448 m) and weight is 131 lb (59.421 kg). Her oral temperature is 98.6 F (37 C). Her blood pressure is 109/52 and her pulse is 81. Her respiration is 14.   Wt Readings from Last 3 Encounters:  10/01/15 131 lb (59.421 kg)  08/27/15 127 lb (57.607 kg)  05/14/15 127 lb (57.607 kg)    Ocular: Sclerae unicteric, pupils equal, round and reactive to light Ear-nose-throat: Oropharynx clear, dentition fair Lymphatic: No cervical supraclavicular or axillary adenopathy Lungs no rales or rhonchi, good excursion bilaterally Heart regular rate and rhythm, no murmur appreciated Abd soft, nontender, positive bowel sounds, no liver or spleen tip palpated on exam, no fluid wave MSK no focal spinal tenderness, no joint edema Neuro: non-focal, well-oriented, appropriate affect Breasts: Deferred  Lab Results  Component Value Date   WBC 55.9* 10/01/2015   HGB 11.0* 10/01/2015   HCT 34.1* 10/01/2015   MCV 72* 10/01/2015   PLT 108* 10/01/2015   Lab Results  Component Value Date   FERRITIN 1,076* 08/20/2014   IRON 118 08/20/2014   TIBC 253 08/20/2014   UIBC 135 08/20/2014   IRONPCTSAT 47 08/20/2014   Lab Results  Component Value Date   RBC 4.75 10/01/2015   No results found for: Nils Pyle [facility] Lab Results  Component Value Date   IGGSERUM 219* 06/06/2012   IGA 22* 06/06/2012   IGMSERUM <5* 06/06/2012   Lab Results  Component Value Date   TOTALPROTELP 5.7* 06/06/2012   ALBUMINELP 66.8* 06/06/2012   A1GS 5.0* 06/06/2012   A2GS 14.4* 06/06/2012   BETS 7.0 06/06/2012   BETA2SER 3.3 06/06/2012   GAMS 3.5* 06/06/2012   MSPIKE NOT DET 06/06/2012   SPEI * 06/06/2012     Chemistry      Component Value Date/Time   NA 133* 05/20/2015 1306   NA 135* 03/29/2015 0908   NA 129 01/16/2013 0851   K 3.9 05/20/2015 1306   K 3.5 03/29/2015 0908   K 2.7* 01/16/2013 0851    CL 98 05/20/2015 1306   CL 93* 01/16/2013 0851   CO2 28 05/20/2015 1306   CO2 25 03/29/2015 0908   CO2 29 01/16/2013 0851   BUN 10 05/20/2015 1306   BUN 7.9 03/29/2015 0908   BUN 11 01/16/2013 0851   CREATININE 0.57 05/20/2015 1306   CREATININE 0.7 03/29/2015 0908   CREATININE 0.7 01/16/2013 0851      Component Value Date/Time  CALCIUM 8.6 05/20/2015 1306   CALCIUM 8.6 03/29/2015 0908   CALCIUM 8.4 01/16/2013 0851   ALKPHOS 67 05/20/2015 1306   ALKPHOS 63 03/29/2015 0908   ALKPHOS 59 01/16/2013 0851   AST 13 05/20/2015 1306   AST 10 03/29/2015 0908   AST 15 01/16/2013 0851   ALT 12 05/20/2015 1306   ALT 11 03/29/2015 0908   ALT 13 01/16/2013 0851   BILITOT 0.4 05/20/2015 1306   BILITOT 0.44 03/29/2015 0908   BILITOT 0.80 01/16/2013 0851     Impression and Plan: Ms. Claes is a very pleasant 82 yo [place] female with history of CLL and anemia. She is feeling fatigued and weak at this time. She is sitting a lot at home and appears to be deconditioned. She is on 2L supplemental O2 at home as needed.  Her Hgb is stable at this time at 11.0. We will see what her iron studies show and bring her in later this week for Feraheme if needed.  Her WBC count continues to be high at 55.9. She has had no issue with infections, no lymphadenopathy and since she did not do well with her only treatment we will continue to just watch this.  She will follow-up with her PCP for possible PT consult.  We will plan to see her back in 8 months for repeat lab work and follow-up.  Both she and her son know to contact us with any questions or concerns. We can certainly see her sooner if need be.   Eliezer Bottom, NP 4/4/201711:35 AM

## 2015-10-01 NOTE — Telephone Encounter (Signed)
Critical Value WBC 55.9 Dr [name] aware. No orders at this time.

## 2015-10-12 ENCOUNTER — Other Ambulatory Visit: Payer: Self-pay | Admitting: Hematology & Oncology

## 2015-10-25 ENCOUNTER — Ambulatory Visit: Payer: Medicare Other | Admitting: Adult Health

## 2015-11-09 ENCOUNTER — Emergency Department (HOSPITAL_BASED_OUTPATIENT_CLINIC_OR_DEPARTMENT_OTHER)
Admission: EM | Admit: 2015-11-09 | Discharge: 2015-11-09 | Disposition: A | Discharge status: Home / Self Care | Payer: Medicare Other | Attending: Emergency Medicine | Admitting: Emergency Medicine

## 2015-11-09 ENCOUNTER — Other Ambulatory Visit: Payer: Self-pay | Admitting: Internal Medicine

## 2015-11-09 ENCOUNTER — Emergency Department (HOSPITAL_BASED_OUTPATIENT_CLINIC_OR_DEPARTMENT_OTHER): Payer: Medicare Other

## 2015-11-09 ENCOUNTER — Encounter (HOSPITAL_BASED_OUTPATIENT_CLINIC_OR_DEPARTMENT_OTHER): Payer: Self-pay | Admitting: *Deleted

## 2015-11-09 ENCOUNTER — Other Ambulatory Visit: Payer: Self-pay | Admitting: Hematology & Oncology

## 2015-11-09 DIAGNOSIS — R197 Diarrhea, unspecified: Secondary | ICD-10-CM | POA: Insufficient documentation

## 2015-11-09 DIAGNOSIS — M1990 Unspecified osteoarthritis, unspecified site: Secondary | ICD-10-CM | POA: Diagnosis not present

## 2015-11-09 DIAGNOSIS — K5730 Diverticulosis of large intestine without perforation or abscess without bleeding: Secondary | ICD-10-CM | POA: Diagnosis not present

## 2015-11-09 DIAGNOSIS — N390 Urinary tract infection, site not specified: Secondary | ICD-10-CM | POA: Diagnosis not present

## 2015-11-09 DIAGNOSIS — M79605 Pain in left leg: Secondary | ICD-10-CM | POA: Diagnosis not present

## 2015-11-09 DIAGNOSIS — R0602 Shortness of breath: Secondary | ICD-10-CM | POA: Insufficient documentation

## 2015-11-09 DIAGNOSIS — I517 Cardiomegaly: Secondary | ICD-10-CM | POA: Diagnosis not present

## 2015-11-09 DIAGNOSIS — J449 Chronic obstructive pulmonary disease, unspecified: Secondary | ICD-10-CM | POA: Insufficient documentation

## 2015-11-09 DIAGNOSIS — M79606 Pain in leg, unspecified: Secondary | ICD-10-CM | POA: Diagnosis not present

## 2015-11-09 DIAGNOSIS — R0789 Other chest pain: Secondary | ICD-10-CM | POA: Diagnosis not present

## 2015-11-09 DIAGNOSIS — K5732 Diverticulitis of large intestine without perforation or abscess without bleeding: Secondary | ICD-10-CM | POA: Insufficient documentation

## 2015-11-09 DIAGNOSIS — R109 Unspecified abdominal pain: Secondary | ICD-10-CM | POA: Diagnosis present

## 2015-11-09 LAB — URINALYSIS, ROUTINE W REFLEX MICROSCOPIC
BILIRUBIN URINE: NEGATIVE
Glucose, UA: NEGATIVE mg/dL
HGB URINE DIPSTICK: NEGATIVE
Ketones, ur: NEGATIVE mg/dL
NITRITE: POSITIVE — AB
PROTEIN: NEGATIVE mg/dL
SPECIFIC GRAVITY, URINE: 1.008 (ref 1.005–1.030)
pH: 7 (ref 5.0–8.0)

## 2015-11-09 LAB — TROPONIN I

## 2015-11-09 LAB — BASIC METABOLIC PANEL
Anion gap: 9 (ref 5–15)
BUN: 9 mg/dL (ref 6–20)
CO2: 24 mmol/L (ref 22–32)
Calcium: 8.9 mg/dL (ref 8.9–10.3)
Chloride: 99 mmol/L — ABNORMAL LOW (ref 101–111)
Creatinine, Ser: 0.56 mg/dL (ref 0.44–1.00)
Glucose, Bld: 132 mg/dL — ABNORMAL HIGH (ref 65–99)
POTASSIUM: 3.9 mmol/L (ref 3.5–5.1)
SODIUM: 132 mmol/L — AB (ref 135–145)

## 2015-11-09 LAB — CBC
HCT: 35.2 % — ABNORMAL LOW (ref 36.0–46.0)
Hemoglobin: 11.6 g/dL — ABNORMAL LOW (ref 12.0–15.0)
MCH: 23.6 pg — AB (ref 26.0–34.0)
MCHC: 33 g/dL (ref 30.0–36.0)
MCV: 71.5 fL — AB (ref 78.0–100.0)
PLATELETS: 127 10*3/uL — AB (ref 150–400)
RBC: 4.92 MIL/uL (ref 3.87–5.11)
RDW: 14.9 % (ref 11.5–15.5)
WBC: 69.3 10*3/uL — AB (ref 4.0–10.5)

## 2015-11-09 LAB — URINE MICROSCOPIC-ADD ON

## 2015-11-09 MED ORDER — METRONIDAZOLE 500 MG PO TABS
500.0000 mg | ORAL_TABLET | Freq: Once | ORAL | Status: AC
  Administered 2015-11-09: 500 mg via ORAL
  Filled 2015-11-09: qty 1

## 2015-11-09 MED ORDER — METRONIDAZOLE 500 MG PO TABS: 500.0000 mg | ORAL_TABLET | Freq: Three times a day (TID) | ORAL | Status: DC

## 2015-11-09 MED ORDER — LEVOFLOXACIN IN D5W 750 MG/150ML IV SOLN: 750.0000 mg | Freq: Once | INTRAVENOUS | Status: DC

## 2015-11-09 MED ORDER — LEVOFLOXACIN 750 MG PO TABS
750.0000 mg | ORAL_TABLET | Freq: Once | ORAL | Status: AC
  Administered 2015-11-09: 750 mg via ORAL
  Filled 2015-11-09: qty 1

## 2015-11-09 MED ORDER — LEVOFLOXACIN 750 MG PO TABS: 750.0000 mg | ORAL_TABLET | Freq: Every day | ORAL | Status: DC

## 2015-11-09 MED ORDER — METRONIDAZOLE IN NACL 5-0.79 MG/ML-% IV SOLN: 500.0000 mg | Freq: Once | INTRAVENOUS | Status: DC

## 2015-11-09 NOTE — Discharge Instructions (Signed)
Please take all of your antibiotics until finished!  Please call your primary care provider on Monday morning to schedule a follow up visit in regard's to today's visit.  Return to ER for new or worsening symptoms, any additional concerns.

## 2015-11-09 NOTE — ED Provider Notes (Addendum)
CSN: [id number]     Arrival date & time 11/09/15  92 History   First MD Initiated Contact with Patient 11/09/15 1600     Chief Complaint  Patient presents with  . Shortness of Breath     (Consider location/radiation/quality/duration/timing/severity/associated sxs/prior Treatment) Patient is a 82 y.o. female presenting with shortness of breath. The history is provided by the patient, the spouse and medical records. No language interpreter was used.  Shortness of Breath Associated symptoms: no abdominal pain, no chest pain, no cough, no fever, no headaches, no neck pain, no rash and no vomiting    Stefanie Henry is a 82 y.o. female  with a PMH of CLL followed by oncology but not receiving active treatment, asthma, anemia, COPD who presents to the Emergency Department complaining of shortness of breath and fatigue. Per husband at bedside, these problems have been going on for a few months, but are gradually continuing to worsen. She was seen by her oncologist on 4/04 with similar complaints however since that time, fatigue and sob worsened. Husband states that she used to be able to ambulate to the bathroom and back without difficulty, however now getting out of bed seems to be a difficult task and she gets short of breath with even the smallest activities. On 2L O2 PRN for comfort at home. No sob at rest. Denies chest pain, but states chest feels tight when she tries to ambulate. No cough, no fever.   Additionally, patient has also complaining of left-sided abdominal pain. This also has been ongoing since Last year. She is followed by [facility] who saw her in October 2016. At that time diverticulitis symptoms had improved, however patient declined DRE and other invasive procedures such as colonoscopy. Admits to normal BM's in the morning with diarrhea often occuring at night - this again been going on for several months now. On carafate and protonix daily. Senna and gas-x as needed.   Past Medical  History  Diagnosis Date  . Asthma   . Arthritis   . Shingles   . Anemia, iron deficiency 06/06/2012  . Chronic lymphatic leukemia ([place])   . Gallstones   . Diverticulitis   . COPD (chronic obstructive pulmonary disease) ([place])   . Cataract   . Blood transfusion without reported diagnosis   . GERD (gastroesophageal reflux disease)   . Neuromuscular disorder [facility])    Past Surgical History  Procedure Laterality Date  . None    . Esophagogastroduodenoscopy N/A 11/07/2014    Procedure: ESOPHAGOGASTRODUODENOSCOPY (EGD);  Surgeon: Gatha Mayer, MD;  Location: [facility];  Service: Endoscopy;  Laterality: N/A;   Family History  Problem Relation Age of Onset  . Diabetes Father   . Stomach cancer Sister   . Colon cancer Neg Hx   . Colon polyps Neg Hx   . Esophageal cancer Neg Hx   . Gallbladder disease Mother    Social History  Substance Use Topics  . Smoking status: Never Smoker   . Smokeless tobacco: Never Used     Comment: PT NEVER SMOKED  . Alcohol Use: No   OB History    No data available     Review of Systems  Constitutional: Negative for fever.  HENT: Negative for congestion.   Eyes: Negative for visual disturbance.  Respiratory: Positive for chest tightness and shortness of breath. Negative for cough.   Cardiovascular: Negative for chest pain, palpitations and leg swelling.  Gastrointestinal: Positive for diarrhea. Negative for nausea, vomiting and abdominal pain.  Musculoskeletal: Negative for neck pain.  Skin: Negative for rash.  Allergic/Immunologic: Positive for immunocompromised state.  Neurological: Negative for headaches.      Allergies  Contrast media; Fruit & vegetable daily; Ibuprofen; and Other  Home Medications   Prior to Admission medications   Medication Sig Start Date End Date Taking? Authorizing Provider  ADVAIR DISKUS 250-50 MCG/DOSE AEPB INHAL 1 PUFF ONCE DAILY 09/08/15   Historical Provider, MD  albuterol (PROVENTIL) (2.5 MG/3ML) 0.083%  nebulizer solution Take 3 mLs (2.5 mg total) by nebulization 2 (two) times daily. 08/30/15   Rigoberto Noel, MD  calcitonin, salmon, (MIACALCIN/FORTICAL) 200 UNIT/ACT nasal spray Place 1 spray into alternate nostrils daily. 06/15/14   Lavon Paganini Angiulli, PA-C  gabapentin (NEURONTIN) 100 MG capsule TAKE 2 CAPSULES BY MOUTH 3 TIMES A DAY AS NEEDED FOR NERVE PAIN 06/19/15   Mackie Pai, PA-C  hyoscyamine (LEVSIN SL) 0.125 MG SL tablet PLACE 1 TABLET (0.125 MG TOTAL) UNDER THE TONGUE EVERY 8 (EIGHT) HOURS AS NEEDED. 04/18/15   Ladene Artist, MD  levofloxacin (LEVAQUIN) 750 MG tablet Take 1 tablet (750 mg total) by mouth daily. 11/09/15   Ozella Almond Jaqua Ching, PA-C  magic mouthwash SOLN 5 ml po qid swish and spit. 03/07/15   Percell Miller Saguier, PA-C  metroNIDAZOLE (FLAGYL) 500 MG tablet Take 1 tablet (500 mg total) by mouth 3 (three) times daily. 11/09/15   Sotero Brinkmeyer Pilcher Umaiza Matusik, PA-C  NEOMYCIN-POLYMYXIN-HYDROCORTISONE (CORTISPORIN) 1 % SOLN otic solution PLACE 3 DROPS INTO THE LEFT EAR EVERY 8 (EIGHT) HOURS. 08/12/15   Mackie Pai, PA-C  oseltamivir (TAMIFLU) 75 MG capsule Take 1 capsule (75 mg total) by mouth 2 (two) times daily. 08/10/15   Burnis Medin, MD  pantoprazole (PROTONIX) 40 MG tablet Take 1 tablet (40 mg total) by mouth daily. 03/07/15   Edward Saguier, PA-C  predniSONE (DELTASONE) 10 MG tablet TAKE 1 TABLET BY MOUTH EVERY DAY 10/14/15   [name], MD  PROAIR HFA 108 [id number] Base) MCG/ACT inhaler INHALE 1-2 PUFFS INTO THE LUNGS EVERY 6 (SIX) HOURS AS NEEDED FOR WHEEZING OR SHORTNESS OF BREATH. 09/19/15   Mackie Pai, PA-C  saccharomyces boulardii (FLORASTOR) 250 MG capsule Take 1 capsule (250 mg total) by mouth 2 (two) times daily. 11/13/14   Theodis Blaze, MD  senna (SENOKOT) 8.6 MG TABS tablet Take 1-2 tablets by mouth daily as needed for mild constipation.    Historical Provider, MD  Simethicone (GAS-X PO) Take 2 capsules by mouth daily.     Historical Provider, MD  sucralfate (CARAFATE) 1 G tablet  TAKE 2 TABLETS IN THE MORNING AND AT BEDTIME 04/18/15   Ladene Artist, MD  VENTOLIN HFA 108 (90 Base) MCG/ACT inhaler INHALE 1-2 PUFFS INTO THE LUNGS EVERY 6 (SIX) HOURS AS NEEDED FOR WHEEZING OR SHORTNESS OF BREATH. 07/10/15   Tanda Rockers, MD   BP 123/69 mmHg  Pulse 82  Temp(Src) 99 F (37.2 C) (Oral)  Resp 20  Ht 5' (1.524 m)  Wt 56.7 kg  BMI 24.41 kg/m2  SpO2 96% Physical Exam  Constitutional: She is oriented to person, place, and time. She appears well-developed and well-nourished. No distress.  HENT:  Head: Normocephalic and atraumatic.  Cardiovascular: Normal rate, regular rhythm, normal heart sounds and intact distal pulses.  Exam reveals no gallop and no friction rub.   No murmur heard. Pulmonary/Chest: Effort normal and breath sounds normal. No respiratory distress.  Abdominal: Soft. Bowel sounds are normal. She exhibits no distension. There is  tenderness (Generalized, worse on left).  Musculoskeletal: She exhibits no edema.  Neurological: She is alert and oriented to person, place, and time.  Skin: Skin is warm and dry.  Nursing note and vitals reviewed.   ED Course  Procedures (including critical care time) Labs Review Labs Reviewed  BASIC METABOLIC PANEL - Abnormal; Notable for the following:    Sodium 132 (*)    Chloride 99 (*)    Glucose, Bld 132 (*)    All other components within normal limits  CBC - Abnormal; Notable for the following:    WBC 69.3 (*)    Hemoglobin 11.6 (*)    HCT 35.2 (*)    MCV 71.5 (*)    MCH 23.6 (*)    Platelets 127 (*)    All other components within normal limits  URINALYSIS, ROUTINE W REFLEX MICROSCOPIC (NOT AT [facility]) - Abnormal; Notable for the following:    APPearance CLOUDY (*)    Nitrite POSITIVE (*)    Leukocytes, UA SMALL (*)    All other components within normal limits  URINE MICROSCOPIC-ADD ON - Abnormal; Notable for the following:    Squamous Epithelial / LPF 0-5 (*)    Bacteria, UA MANY (*)    All other components  within normal limits  URINE CULTURE  TROPONIN I    Imaging Review Ct Abdomen Pelvis Wo Contrast  11/09/2015  CLINICAL DATA:  82 year old female with left-sided abdominal pain and weakness. EXAM: CT ABDOMEN AND PELVIS WITHOUT CONTRAST TECHNIQUE: Multidetector CT imaging of the abdomen and pelvis was performed following the standard protocol without IV contrast. COMPARISON:  CT dated 05/21/2015 FINDINGS: Evaluation of this exam is limited in the absence of intravenous contrast. The visualized lung bases are clear. No intra-abdominal free air or free fluid. The liver, gallbladder, pancreas appear unremarkable. There is mild splenomegaly measuring 15 cm in greatest length. Set stable stop 1.5 cm right renal exophytic hypodense lesion is not well characterized on this noncontrast study but likely represents a cyst. There is no hydronephrosis or nephrolithiasis on this side. The visualized ureters and urinary bladder appear unremarkable. There is a stable 4 cm calcified uterine fundal fibroid. A smaller uterine fibroid is also noted. The ovaries are grossly unremarkable. There is sigmoid diverticulosis. Minimal perisigmoid haziness may be chronic or represent a mild diverticulitis. Clinical correlation is recommended. There is no evidence of perforation or abscess. There is no evidence of bowel obstruction. Normal appendix. There is mild aortoiliac atherosclerotic disease. Evaluation of the vasculature is however limited in the absence of intravenous contrast. No portal venous gas identified. There is no adenopathy. Caps midline vertical anterior abdominal wall incisional scar. There is degenerative changes of the spine. Osteopenia. Chronic L1 compression deformity and anterior wedging No acute fracture. IMPRESSION: Sigmoid diverticulosis with possible mild acute diverticulitis. No abscess. Electronically Signed   By: Anner Crete M.D.   On: 11/09/2015 19:19   Dg Chest 2 View  11/09/2015  CLINICAL DATA:   Initial encounter. 82 y/o female with c/o worsening SOB x2 days, although this started about a week ago. Chest tightness at times. Also c/o weakness and no energy. Left abd pain as well. EXAM: CHEST  2 VIEW COMPARISON:  08/10/2015 FINDINGS: Heart is enlarged. There are slight interstitial markings consistent with interstitial pulmonary edema. No focal consolidations. Stable chronic changes in the right middle lobe and lingula. Degenerative changes are seen in the mid thoracic spine. IMPRESSION: 1. Cardiomegaly. 2.  No evidence for acute cardiopulmonary abnormality. Electronically  Signed   By: Nolon Nations M.D.   On: 11/09/2015 17:04   I have personally reviewed and evaluated these images and lab results as part of my medical decision-making.   EKG Interpretation   Date/Time:  Saturday Nov 09 2015 15:59:18 EDT Ventricular Rate:  88 PR Interval:  175 QRS Duration: 91 QT Interval:  328 QTC Calculation: 397 R Axis:   70 Text Interpretation:  Sinus rhythm Low voltage, precordial leads no STEMI.  no change from old Confirmed by Johnney Killian, MD, Jeannie Done [phone]) on 11/09/2015  4:09:53 PM      MDM   Final diagnoses:  Leg pain  UTI (lower urinary tract infection)  Diverticulitis of large intestine without perforation or abscess without bleeding   [name] presents with complaint of shortness of breath, fatigue, and left sided abdominal pain. Hx of CLL not currently receiving treatment.Patient and husband at bedside state goal for care is improving quality of life. They were not aware of hospice care. I discussed hospice and palliative care with them, and they would like resources to explore this option.  Labs: UA with nitritie +, small leuks, 6-30 WBC's, CBC with white count of 69 (hx of CLL, previous CBC with wbc of 55.9) and stable anemia, Trop negative, BMP reassuring.  EKG reviewed.  Imaging: CXR with no acute cardiopulmonary disease. CT abdomen shows sigmoid diverticulosis with possible  acute diverticulitis.  A&P: UTI, diverticulitis- will treat with Levaquin and Flagyl. Follow-up with primary care physician discussed. Hospice and palliative care outpatient information provided. Return precautions given and all questions answered.  Patient seen by and discussed with Dr. Johnney Killian who agrees with treatment plan.    [facility] Areg Bialas, PA-C 11/09/15 2218  Charlesetta Shanks, MD 11/09/15 [place], PA-C 01/17/16 1600  Charlesetta Shanks, MD 01/22/16 [phone]

## 2015-11-09 NOTE — ED Notes (Signed)
SHOB x 1 week.  Worse today. Also c/o feeling weak and having no energy. Chest tightness at times. Left side abd pain as swell. Denies cough or other s/s.

## 2015-11-18 ENCOUNTER — Encounter: Payer: Self-pay | Admitting: Medical

## 2015-11-18 ENCOUNTER — Ambulatory Visit (INDEPENDENT_AMBULATORY_CARE_PROVIDER_SITE_OTHER): Payer: Medicare Other | Admitting: Medical

## 2015-11-18 ENCOUNTER — Telehealth: Payer: Self-pay

## 2015-11-18 ENCOUNTER — Other Ambulatory Visit (HOSPITAL_BASED_OUTPATIENT_CLINIC_OR_DEPARTMENT_OTHER): Payer: Medicare Other

## 2015-11-18 VITALS — BP 100/64 | HR 93 | Temp 98.6°F

## 2015-11-18 DIAGNOSIS — D509 Iron deficiency anemia, unspecified: Secondary | ICD-10-CM | POA: Diagnosis not present

## 2015-11-18 DIAGNOSIS — R5383 Other fatigue: Secondary | ICD-10-CM

## 2015-11-18 DIAGNOSIS — M79662 Pain in left lower leg: Secondary | ICD-10-CM

## 2015-11-18 DIAGNOSIS — R1032 Left lower quadrant pain: Secondary | ICD-10-CM | POA: Diagnosis not present

## 2015-11-18 DIAGNOSIS — C9110 Chronic lymphocytic leukemia of B-cell type not having achieved remission: Secondary | ICD-10-CM

## 2015-11-18 DIAGNOSIS — K5732 Diverticulitis of large intestine without perforation or abscess without bleeding: Secondary | ICD-10-CM | POA: Diagnosis not present

## 2015-11-18 DIAGNOSIS — Z87440 Personal history of urinary (tract) infections: Secondary | ICD-10-CM

## 2015-11-18 LAB — CBC WITH DIFFERENTIAL/PLATELET
BASOS ABS: 0.2 10*3/uL — AB (ref 0.0–0.1)
Basophils Relative: 0.4 % (ref 0.0–3.0)
EOS ABS: 0.1 10*3/uL (ref 0.0–0.7)
Eosinophils Relative: 0.2 % (ref 0.0–5.0)
HEMATOCRIT: 35.9 % — AB (ref 36.0–46.0)
Hemoglobin: 11.3 g/dL — ABNORMAL LOW (ref 12.0–15.0)
Lymphs Abs: 44.6 10*3/uL — ABNORMAL HIGH (ref 0.7–4.0)
MCHC: 31.5 g/dL (ref 30.0–36.0)
MCV: 72.9 fl — AB (ref 78.0–100.0)
MONOS PCT: 2.8 % — AB (ref 3.0–12.0)
Monocytes Absolute: 1.4 10*3/uL — ABNORMAL HIGH (ref 0.1–1.0)
NEUTROS ABS: 3.3 10*3/uL (ref 1.4–7.7)
NEUTROS PCT: 6.6 % — AB (ref 43.0–77.0)
PLATELETS: 115 10*3/uL — AB (ref 150.0–400.0)
RBC: 4.93 Mil/uL (ref 3.87–5.11)
RDW: 15 % (ref 11.5–15.5)
WBC: 49.6 10*3/uL (ref 4.0–10.5)

## 2015-11-18 LAB — POC URINALSYSI DIPSTICK (AUTOMATED)
BILIRUBIN UA: NEGATIVE
Glucose, UA: NEGATIVE
KETONES UA: NEGATIVE
Protein, UA: NEGATIVE
SPEC GRAV UA: 1.015
Urobilinogen, UA: 0.2
pH, UA: 6

## 2015-11-18 LAB — VITAMIN B12: VITAMIN B 12: 872 pg/mL (ref 211–911)

## 2015-11-18 LAB — IRON AND TIBC
%SAT: 58 % — ABNORMAL HIGH (ref 11–50)
IRON: 128 ug/dL (ref 45–160)
TIBC: 222 ug/dL — AB (ref 250–450)
UIBC: 94 ug/dL — AB (ref 125–400)

## 2015-11-18 LAB — FERRITIN: Ferritin: 472.3 ng/mL — ABNORMAL HIGH (ref 10.0–291.0)

## 2015-11-18 MED ORDER — CALCITONIN (SALMON) 200 UNIT/ACT NA SOLN: 1.0000 | Freq: Every day | NASAL | Status: DC

## 2015-11-18 MED ORDER — HYOSCYAMINE SULFATE 0.125 MG SL SUBL: SUBLINGUAL_TABLET | SUBLINGUAL | Status: AC

## 2015-11-18 MED ORDER — OMEPRAZOLE 20 MG PO CPDR: 20.0000 mg | DELAYED_RELEASE_CAPSULE | Freq: Every day | ORAL | Status: DC

## 2015-11-18 NOTE — Telephone Encounter (Signed)
Pt was scheduled for an ultarsound on 11/18/15 at 330 pm and pt's son states that he was not able to make that appointment this afternoon due to his schedule and he could only come in for a morning appointment on Wednesday 11/20/15 at 930 am. Pt has been scheduled for 930 am on 11/20/15 for that appointment and they are aware that they will need to go [place] first floor of the [place]. The best contact number is [phone].

## 2015-11-18 NOTE — Progress Notes (Signed)
Subjective:    Patient ID: Stefanie Henry, female    DOB: 20-Jul-1929, 82 y.o.   MRN: [id number]  HPI  Below is follow up history from ED.    Stefanie Henry is a 82 y.o. female with a PMH of CLL followed by oncology but not receiving active treatment, asthma, anemia, COPD who presents to the Emergency Department complaining of shortness of breath and fatigue. Per husband at bedside, these problems have been going on for a few months, but are gradually continuing to worsen. She was seen by her oncologist on 4/04 with similar complaints however since that time, fatigue and sob worsened. Husband states that she used to be able to ambulate to the bathroom and back without difficulty, however now getting out of bed seems to be a difficult task and she gets short of breath with even the smallest activities. On 2L O2 PRN for comfort at home. No sob at rest. Denies chest pain, but states chest feels tight when she tries to ambulate. No cough, no fever.   Additionally, patient has also complaining of left-sided abdominal pain. This also has been ongoing since Last year. She is followed by [facility] who saw her in October 2016. At that time diverticulitis symptoms had improved, however patient declined DRE and other invasive procedures such as colonoscopy. Admits to normal BM's in the morning with diarrhea often occuring at night - this again been going on for several months now. On carafate and protonix daily. Senna and gas-x as needed.  Below is final diagnosis from Ed.   Stefanie Henry presents with complaint of shortness of breath, fatigue, and left sided abdominal pain. Hx of CLL not currently receiving treatment.Patient and husband at bedside state goal for care is improving quality of life. They were not aware of hospice care. I discussed hospice and palliative care with them, and they would like resources to explore this option.  Labs: UA with nitritie +, small leuks, 6-30 WBC's, CBC with white count  of 69 (hx of CLL, previous CBC with wbc of 55.9) and stable anemia, Trop negative, BMP reassuring.  EKG reviewed.  Imaging: CXR with no acute cardiopulmonary disease. CT abdomen shows sigmoid diverticulosis with possible acute diverticulitis.  A&P: UTI, diverticulitis- will treat with Levaquin and Flagyl. Follow-up with primary care physician discussed. Hospice and palliative care outpatient information provided. Return precautions given and all questions answered.  This ends final diagnois ED section.     Pt in for follow up. Pt had some left flank pain, sob and some pain. No longer complains sob.  She describes feeling improve left side abdomen pain now  but very faint discomfort now. Then son states that for a while she has had faint intermittent mild pain in the past in this area for years. Pt son states that even if she had  severe diagnosis in abdomen they probably would not treat her. He states even if something was found on colonoscopy. This is consistent with family thought on her CLL. They have not treated her. Gi per son report states they can do nothing for her since she declined colonoscopy.  Pt Ct abd/pelvis in ED showed some mild acute diverticulitis.  Also they diagnosed her with UTI. No culture done on her urine.  Pt belches a lot today on exam. Pt used protonix. She thinks prior omeprazole helped her more.   Pt son only gave her flagyl that ED rx'd. Did not give any levofloxin. Pt could not take levofloxin and  flagyl at same time would cause nauseu and vomit.   Also one week of left leg pain for one week. She feels maybe cold foot. Points to calf area as source of pain. Also feels restless in her legs.    Review of Systems  Constitutional: Positive for fatigue. Negative for fever and chills.  Respiratory: Negative for cough, chest tightness, shortness of breath and wheezing.        No respiratory complaints.   Cardiovascular: Negative for chest pain and  palpitations.  Gastrointestinal: Positive for abdominal pain. Negative for nausea, vomiting, diarrhea, constipation, blood in stool and abdominal distention.  Genitourinary: Positive for pelvic pain.  Musculoskeletal: Negative for back pain.       Left leg pain.  Skin: Negative for rash.  Hematological: Negative for adenopathy. Does not bruise/bleed easily.  Psychiatric/Behavioral: Negative for behavioral problems and confusion.    Past Medical History  Diagnosis Date  . Asthma   . Arthritis   . Shingles   . Anemia, iron deficiency 06/06/2012  . Chronic lymphatic leukemia ([place])   . Gallstones   . Diverticulitis   . COPD (chronic obstructive pulmonary disease) ([place])   . Cataract   . Blood transfusion without reported diagnosis   . GERD (gastroesophageal reflux disease)   . Neuromuscular disorder [facility])      Social History   Social History  . Marital Status: Married    Spouse Name: N/A  . Number of Children: 2  . Years of Education: N/A   Occupational History  . Retired    Social History Main Topics  . Smoking status: Never Smoker   . Smokeless tobacco: Never Used     Comment: PT NEVER SMOKED  . Alcohol Use: No  . Drug Use: No  . Sexual Activity: Not on file   Other Topics Concern  . Not on file   Social History Narrative    Past Surgical History  Procedure Laterality Date  . None    . Esophagogastroduodenoscopy N/A 11/07/2014    Procedure: ESOPHAGOGASTRODUODENOSCOPY (EGD);  Surgeon: Gatha Mayer, MD;  Location: [facility];  Service: Endoscopy;  Laterality: N/A;    Family History  Problem Relation Age of Onset  . Diabetes Father   . Stomach cancer Sister   . Colon cancer Neg Hx   . Colon polyps Neg Hx   . Esophageal cancer Neg Hx   . Gallbladder disease Mother     Allergies  Allergen Reactions  . Contrast Media [Iodinated Diagnostic Agents] Other (See Comments)    Stomach discomfort  . Fruit & Vegetable Daily [Nutritional Supplements] Other (See  Comments)    Citrus fruit causes stomach discomfort  . Ibuprofen Other (See Comments)    Stomach discomfort  . Other     Cannot take any antibiotics, causes rash    Current Outpatient Prescriptions on File Prior to Visit  Medication Sig Dispense Refill  . ADVAIR DISKUS 250-50 MCG/DOSE AEPB INHAL 1 PUFF ONCE DAILY  5  . albuterol (PROVENTIL) (2.5 MG/3ML) 0.083% nebulizer solution Take 3 mLs (2.5 mg total) by nebulization 2 (two) times daily. 180 mL 3  . gabapentin (NEURONTIN) 100 MG capsule TAKE 2 CAPSULES BY MOUTH 3 TIMES A DAY AS NEEDED FOR NERVE PAIN 180 capsule 3  . hyoscyamine (LEVSIN SL) 0.125 MG SL tablet PLACE 1 TABLET (0.125 MG TOTAL) UNDER THE TONGUE EVERY 8 (EIGHT) HOURS AS NEEDED. 60 tablet 11  . metroNIDAZOLE (FLAGYL) 500 MG tablet Take 1 tablet (500 mg total)  by mouth 3 (three) times daily. 21 tablet 0  . NEOMYCIN-POLYMYXIN-HYDROCORTISONE (CORTISPORIN) 1 % SOLN otic solution PLACE 3 DROPS INTO THE LEFT EAR EVERY 8 (EIGHT) HOURS. 10 mL 0  . predniSONE (DELTASONE) 10 MG tablet TAKE 1 TABLET BY MOUTH EVERY DAY 30 tablet 0  . PROAIR HFA 108 (90 Base) MCG/ACT inhaler INHALE 1-2 PUFFS INTO THE LUNGS EVERY 6 (SIX) HOURS AS NEEDED FOR WHEEZING OR SHORTNESS OF BREATH. 8.5 Inhaler 0  . saccharomyces boulardii (FLORASTOR) 250 MG capsule Take 1 capsule (250 mg total) by mouth 2 (two) times daily. 60 capsule 1  . senna (SENOKOT) 8.6 MG TABS tablet Take 1-2 tablets by mouth daily as needed for mild constipation.    . Simethicone (GAS-X PO) Take 2 capsules by mouth daily.     . sucralfate (CARAFATE) 1 G tablet TAKE 2 TABLETS IN THE MORNING AND AT BEDTIME 120 tablet 11  . VENTOLIN HFA 108 (90 Base) MCG/ACT inhaler INHALE 1-2 PUFFS INTO THE LUNGS EVERY 6 HOURS AS NEEDED FOR WHEEZING OR SHORTNESS OF BREATH. 18 Inhaler 0   No current facility-administered medications on file prior to visit.    BP 100/64 mmHg  Pulse 93  Temp(Src) 98.6 F (37 C) (Oral)  SpO2 98%       Objective:    Physical Exam  General Appearance- Not in acute distress.  HEENT Eyes- Scleraeral/Conjuntiva-bilat- Not Yellow. Mouth & Throat- Normal.  Chest and Lung Exam Auscultation: Breath sounds:-Normal. Adventitious sounds:- No Adventitious sounds.  Cardiovascular Auscultation:Rythm - Regular. Heart Sounds -Normal heart sounds.  Abdomen Inspection:-Inspection Normal.  Palpation/Perucssion: Palpation and Percussion of the abdomen reveal- faint left lower quadrant/flank pain at best, No Rebound tenderness, No rigidity(Guarding) and No Palpable abdominal masses.  Liver:-Normal.  Spleen:- Normal.   Back- no cva tenderness.  Left lower leg- faint pain on palpation of left calf. Negative homans sign. No swelling.       Assessment & Plan:  Will get cbc, iron studies, and b12 level today.  Will get urine culture today to see if uti present. Urine in ED looked suspicious.  Stop protonix and use omeprazole for your reflux history.   I would start levofloxin for diverticulitis since flagyl was only taken. If abdomen pain worsens or changes then ED evaluation.  Will also get left lower ext doppler stat to assess if any dvt.   Advait Buice, Percell Miller, PA-C  Follow up 2 weeks or as needed

## 2015-11-18 NOTE — Patient Instructions (Addendum)
Will get cbc, iron studies, and b12 level today to evaluate  your fatigue.  Will get urine culture today to see if uti present. Urine in ED looked suspicious.  Stop protonix and use omeprazole for your reflux history.   I would start levofloxin for diverticulitis since flagyl was only taken. If abdomen pain worsens or changes then ED evaluation.  Will also get left lower ext doppler stat to assess if any dvt.  Follow up 2 weeks or as needed

## 2015-11-18 NOTE — Addendum Note (Signed)
Addended by: Tasia Catchings on: 11/18/2015 10:52 AM   Modules accepted: Orders

## 2015-11-18 NOTE — Telephone Encounter (Signed)
Spoke with Erasmo Downer at [place] and she advised me on Stefanie Henry's abnormal results. They were as follows: UIBC was low at 94, TIBC was low at 222 and Sat % was high at 58. Message was sent to provider.

## 2015-11-18 NOTE — Progress Notes (Signed)
Pre visit review using our clinic review tool, if applicable. No additional management support is needed unless otherwise documented below in the visit note. 

## 2015-11-19 ENCOUNTER — Telehealth: Payer: Self-pay | Admitting: Medical

## 2015-11-19 NOTE — Telephone Encounter (Signed)
Reviwed results of pt lab day of visit. She has CLL.

## 2015-11-19 NOTE — Telephone Encounter (Signed)
[facility] lab also reported critical WBC @ 49.6

## 2015-11-20 ENCOUNTER — Ambulatory Visit (HOSPITAL_BASED_OUTPATIENT_CLINIC_OR_DEPARTMENT_OTHER)
Admission: RE | Admit: 2015-11-20 | Discharge: 2015-11-20 | Disposition: A | Discharge status: Home / Self Care | Payer: Medicare Other | Source: Ambulatory Visit | Attending: Medical | Admitting: Medical

## 2015-11-20 DIAGNOSIS — M79662 Pain in left lower leg: Secondary | ICD-10-CM | POA: Insufficient documentation

## 2015-11-20 LAB — URINE CULTURE

## 2015-11-20 MED ORDER — AMOXICILLIN-POT CLAVULANATE 875-125 MG PO TABS: 1.0000 | ORAL_TABLET | Freq: Two times a day (BID) | ORAL | Status: DC

## 2015-11-20 NOTE — Addendum Note (Signed)
Addended by: Tasia Catchings on: 11/20/2015 06:32 PM   Modules accepted: Orders, Medications

## 2015-12-02 ENCOUNTER — Ambulatory Visit (INDEPENDENT_AMBULATORY_CARE_PROVIDER_SITE_OTHER): Payer: Medicare Other | Admitting: Adult Health

## 2015-12-02 ENCOUNTER — Encounter: Payer: Self-pay | Admitting: Adult Health

## 2015-12-02 VITALS — BP 98/64 | HR 80 | Temp 98.8°F | Ht <= 58 in | Wt 130.0 lb

## 2015-12-02 DIAGNOSIS — J9611 Chronic respiratory failure with hypoxia: Secondary | ICD-10-CM | POA: Diagnosis not present

## 2015-12-02 DIAGNOSIS — J4540 Moderate persistent asthma, uncomplicated: Secondary | ICD-10-CM

## 2015-12-02 DIAGNOSIS — J9610 Chronic respiratory failure, unspecified whether with hypoxia or hypercapnia: Secondary | ICD-10-CM

## 2015-12-02 HISTORY — DX: Chronic respiratory failure, unspecified whether with hypoxia or hypercapnia: J96.10

## 2015-12-02 MED ORDER — ALBUTEROL SULFATE (2.5 MG/3ML) 0.083% IN NEBU: 2.5000 mg | INHALATION_SOLUTION | Freq: Two times a day (BID) | RESPIRATORY_TRACT | Status: AC

## 2015-12-02 MED ORDER — MOMETASONE FURO-FORMOTEROL FUM 200-5 MCG/ACT IN AERO: 2.0000 | INHALATION_SPRAY | Freq: Two times a day (BID) | RESPIRATORY_TRACT | Status: DC

## 2015-12-02 MED ORDER — ALBUTEROL SULFATE HFA 108 (90 BASE) MCG/ACT IN AERS: INHALATION_SPRAY | RESPIRATORY_TRACT | Status: AC

## 2015-12-02 NOTE — Assessment & Plan Note (Addendum)
Sx with poor medication compliance  Pt education given   Plan  Restart Dulera 2 puffs Twice daily  , rinse well after it.  Stop Advair .  Continue on Oxygen 2l/m with activity and At bedtime   follow up Dr. Elsworth Soho  In 2-3 months at [place] at [facility] .  Please contact office for sooner follow up if symptoms do not improve or worsen or seek emergency care

## 2015-12-02 NOTE — Assessment & Plan Note (Signed)
Cont on O2 with act and At bedtime   

## 2015-12-02 NOTE — Patient Instructions (Addendum)
Restart Dulera 2 puffs Twice daily  , rinse well after it.  Stop Advair .  Continue on Oxygen 2l/m with activity and At bedtime   follow up Dr. Elsworth Soho  In 2-3 months at [place] at [facility] .  Please contact office for sooner follow up if symptoms do not improve or worsen or seek emergency care

## 2015-12-02 NOTE — Progress Notes (Signed)
Subjective:    Patient ID: Stefanie Henry, female    DOB: 1929/10/09, 82 y.o.   MRN: [id number]  HPI 82 yo female never smoker with Asthma and Chronic Resp Failure on o2 with act and At bedtime   She has CLL followed by Dr. [name] on chronic steroids w/ prednisone 10mg  daily   TEST  Echo 09/2014-normal LV function, grade 1 diastolic dysfunction CT abdomen 04/2015 bases of lungs appear clear Chest x-ray 07/2015 suggests right middle lobe scarring Spirometry 04/2015-ratio 66, FEV1 44% and FVC 49%   12/02/2015 Follow up : Asthma  Patient returns for a three-month follow-up. She is accompanied by her son who helps with interpretation. Says that her shortness of breath is okay at rest. However, she does get winded with activity. She was started on [facility] last visit. However, only took the sample. She likes to use her Ventolin inhaler as she feels it helps the most. Explained that Ruthe Mannan is a controller inhaler. She also uses Advair on occasion. But does not like it because it causes her throat to be sore. She has oxygen which she feels it does help her the most. She denies any hemoptysis, chest pain, orthopnea, PND, or increased leg swelling.   Past Medical History  Diagnosis Date  . Asthma   . Arthritis   . Shingles   . Anemia, iron deficiency 06/06/2012  . Chronic lymphatic leukemia ([place])   . Gallstones   . Diverticulitis   . COPD (chronic obstructive pulmonary disease) ([place])   . Cataract   . Blood transfusion without reported diagnosis   . GERD (gastroesophageal reflux disease)   . Neuromuscular disorder [facility])    Current Outpatient Prescriptions on File Prior to Visit  Medication Sig Dispense Refill  . ADVAIR DISKUS 250-50 MCG/DOSE AEPB INHAL 1 PUFF ONCE DAILY  5  . albuterol (PROVENTIL) (2.5 MG/3ML) 0.083% nebulizer solution Take 3 mLs (2.5 mg total) by nebulization 2 (two) times daily. 180 mL 3  . calcitonin, salmon, (MIACALCIN/FORTICAL) 200 UNIT/ACT nasal spray Place 1 spray  into alternate nostrils daily. 3.7 mL 12  . gabapentin (NEURONTIN) 100 MG capsule TAKE 2 CAPSULES BY MOUTH 3 TIMES A DAY AS NEEDED FOR NERVE PAIN 180 capsule 3  . hyoscyamine (LEVSIN SL) 0.125 MG SL tablet PLACE 1 TABLET (0.125 MG TOTAL) UNDER THE TONGUE EVERY 8 (EIGHT) HOURS AS NEEDED. 60 tablet 2  . NEOMYCIN-POLYMYXIN-HYDROCORTISONE (CORTISPORIN) 1 % SOLN otic solution PLACE 3 DROPS INTO THE LEFT EAR EVERY 8 (EIGHT) HOURS. 10 mL 0  . omeprazole (PRILOSEC) 20 MG capsule Take 1 capsule (20 mg total) by mouth daily. 30 capsule 3  . predniSONE (DELTASONE) 10 MG tablet TAKE 1 TABLET BY MOUTH EVERY DAY 30 tablet 0  . saccharomyces boulardii (FLORASTOR) 250 MG capsule Take 1 capsule (250 mg total) by mouth 2 (two) times daily. 60 capsule 1  . senna (SENOKOT) 8.6 MG TABS tablet Take 1-2 tablets by mouth daily as needed for mild constipation.    . Simethicone (GAS-X PO) Take 2 capsules by mouth daily.     . sucralfate (CARAFATE) 1 G tablet TAKE 2 TABLETS IN THE MORNING AND AT BEDTIME 120 tablet 11  . VENTOLIN HFA 108 (90 Base) MCG/ACT inhaler INHALE 1-2 PUFFS INTO THE LUNGS EVERY 6 HOURS AS NEEDED FOR WHEEZING OR SHORTNESS OF BREATH. 18 Inhaler 0  . amoxicillin-clavulanate (AUGMENTIN) 875-125 MG tablet Take 1 tablet by mouth 2 (two) times daily. (Patient not taking: Reported on 12/02/2015) 14 tablet 0  .  metroNIDAZOLE (FLAGYL) 500 MG tablet Take 1 tablet (500 mg total) by mouth 3 (three) times daily. (Patient not taking: Reported on 12/02/2015) 21 tablet 0   No current facility-administered medications on file prior to visit.     Review of Systems Constitutional:   No  weight loss, night sweats,  Fevers, chills, + fatigue, or  lassitude.  HEENT:   No headaches,  Difficulty swallowing,  Tooth/dental problems, or  Sore throat,                No sneezing, itching, ear ache, nasal congestion, post nasal drip,   CV:  No chest pain,  Orthopnea, PND, swelling in lower extremities, anasarca, dizziness,  palpitations, syncope.   GI  No heartburn, indigestion, abdominal pain, nausea, vomiting, diarrhea, change in bowel habits, loss of appetite, bloody stools.   Resp:    No chest wall deformity  Skin: no rash or lesions.  GU: no dysuria, change in color of urine, no urgency or frequency.  No flank pain, no hematuria   MS:  No joint pain or swelling.  No decreased range of motion.  No back pain.  Psych:  No change in mood or affect. No depression or anxiety.  No memory loss.         Objective:   Physical Exam  Filed Vitals:   12/02/15 1112  BP: 98/64  Pulse: 80  Temp: 98.8 F (37.1 C)  TempSrc: Oral  Height: 4\' 7"  (1.397 m)  Weight: 130 lb (58.968 kg)  SpO2: 97%   GEN: A/Ox3; pleasant , NAD , elderly in wc   HEENT:  [place]/AT,  EACs-clear, TMs-wnl, NOSE-clear, THROAT-clear, no lesions, no postnasal drip or exudate noted.   NECK:  Supple w/ fair ROM; no JVD; normal carotid impulses w/o bruits; no thyromegaly or nodules palpated; no lymphadenopathy.  RESP  Clear  P & A; w/o, wheezes/ rales/ or rhonchi.no accessory muscle use, no dullness to percussion  CARD:  RRR, no m/r/g  , tr peripheral edema, pulses intact, no cyanosis or clubbing.  GI:   Soft & nt; nml bowel sounds; no organomegaly or masses detected.  Musco: Warm bil, no deformities or joint swelling noted.   Neuro: alert, no focal deficits noted.    Skin: Warm, no lesions or rashes  Tammy Parrett NP-C  [facility]  12/02/2015      Assessment & Plan:

## 2015-12-06 NOTE — Progress Notes (Signed)
Reviewed & agree with plan  

## 2015-12-09 ENCOUNTER — Other Ambulatory Visit: Payer: Self-pay | Admitting: Hematology & Oncology

## 2015-12-12 ENCOUNTER — Ambulatory Visit (INDEPENDENT_AMBULATORY_CARE_PROVIDER_SITE_OTHER): Payer: Medicare Other | Admitting: Medical

## 2015-12-12 ENCOUNTER — Emergency Department (HOSPITAL_BASED_OUTPATIENT_CLINIC_OR_DEPARTMENT_OTHER): Payer: Medicare Other

## 2015-12-12 ENCOUNTER — Inpatient Hospital Stay (HOSPITAL_BASED_OUTPATIENT_CLINIC_OR_DEPARTMENT_OTHER)
Admission: EM | Admit: 2015-12-12 | Discharge: 2015-12-15 | DRG: 190 | Disposition: A | Discharge status: Home / Self Care | Payer: Medicare Other | Attending: Internal Medicine | Admitting: Internal Medicine

## 2015-12-12 ENCOUNTER — Encounter (HOSPITAL_BASED_OUTPATIENT_CLINIC_OR_DEPARTMENT_OTHER): Payer: Self-pay | Admitting: Emergency Medicine

## 2015-12-12 ENCOUNTER — Encounter: Payer: Self-pay | Admitting: Medical

## 2015-12-12 VITALS — BP 100/70 | HR 61 | Temp 102.5°F | Ht <= 58 in | Wt 116.2 lb

## 2015-12-12 DIAGNOSIS — J9610 Chronic respiratory failure, unspecified whether with hypoxia or hypercapnia: Secondary | ICD-10-CM | POA: Diagnosis present

## 2015-12-12 DIAGNOSIS — R1013 Epigastric pain: Secondary | ICD-10-CM

## 2015-12-12 DIAGNOSIS — Z800 Family history of malignant neoplasm of digestive organs: Secondary | ICD-10-CM

## 2015-12-12 DIAGNOSIS — Z9889 Other specified postprocedural states: Secondary | ICD-10-CM

## 2015-12-12 DIAGNOSIS — J9611 Chronic respiratory failure with hypoxia: Secondary | ICD-10-CM | POA: Diagnosis not present

## 2015-12-12 DIAGNOSIS — Z9981 Dependence on supplemental oxygen: Secondary | ICD-10-CM

## 2015-12-12 DIAGNOSIS — K5730 Diverticulosis of large intestine without perforation or abscess without bleeding: Secondary | ICD-10-CM | POA: Diagnosis not present

## 2015-12-12 DIAGNOSIS — N3000 Acute cystitis without hematuria: Secondary | ICD-10-CM | POA: Diagnosis not present

## 2015-12-12 DIAGNOSIS — I959 Hypotension, unspecified: Secondary | ICD-10-CM | POA: Diagnosis not present

## 2015-12-12 DIAGNOSIS — R5383 Other fatigue: Secondary | ICD-10-CM

## 2015-12-12 DIAGNOSIS — C9110 Chronic lymphocytic leukemia of B-cell type not having achieved remission: Secondary | ICD-10-CM

## 2015-12-12 DIAGNOSIS — K219 Gastro-esophageal reflux disease without esophagitis: Secondary | ICD-10-CM | POA: Diagnosis present

## 2015-12-12 DIAGNOSIS — J440 Chronic obstructive pulmonary disease with acute lower respiratory infection: Secondary | ICD-10-CM | POA: Diagnosis not present

## 2015-12-12 DIAGNOSIS — K5732 Diverticulitis of large intestine without perforation or abscess without bleeding: Secondary | ICD-10-CM | POA: Diagnosis present

## 2015-12-12 DIAGNOSIS — D696 Thrombocytopenia, unspecified: Secondary | ICD-10-CM | POA: Diagnosis present

## 2015-12-12 DIAGNOSIS — A419 Sepsis, unspecified organism: Secondary | ICD-10-CM | POA: Diagnosis not present

## 2015-12-12 DIAGNOSIS — N390 Urinary tract infection, site not specified: Secondary | ICD-10-CM | POA: Diagnosis not present

## 2015-12-12 DIAGNOSIS — F05 Delirium due to known physiological condition: Secondary | ICD-10-CM

## 2015-12-12 DIAGNOSIS — D649 Anemia, unspecified: Secondary | ICD-10-CM | POA: Diagnosis present

## 2015-12-12 DIAGNOSIS — J189 Pneumonia, unspecified organism: Secondary | ICD-10-CM | POA: Diagnosis present

## 2015-12-12 DIAGNOSIS — Y95 Nosocomial condition: Secondary | ICD-10-CM | POA: Diagnosis present

## 2015-12-12 DIAGNOSIS — Z87440 Personal history of urinary (tract) infections: Secondary | ICD-10-CM | POA: Diagnosis not present

## 2015-12-12 DIAGNOSIS — M1990 Unspecified osteoarthritis, unspecified site: Secondary | ICD-10-CM | POA: Diagnosis present

## 2015-12-12 DIAGNOSIS — Z888 Allergy status to other drugs, medicaments and biological substances status: Secondary | ICD-10-CM | POA: Diagnosis not present

## 2015-12-12 DIAGNOSIS — Z79899 Other long term (current) drug therapy: Secondary | ICD-10-CM | POA: Diagnosis not present

## 2015-12-12 DIAGNOSIS — R0602 Shortness of breath: Secondary | ICD-10-CM | POA: Diagnosis not present

## 2015-12-12 DIAGNOSIS — Z1611 Resistance to penicillins: Secondary | ICD-10-CM | POA: Diagnosis present

## 2015-12-12 DIAGNOSIS — T360X5A Adverse effect of penicillins, initial encounter: Secondary | ICD-10-CM | POA: Diagnosis present

## 2015-12-12 DIAGNOSIS — K649 Unspecified hemorrhoids: Secondary | ICD-10-CM | POA: Diagnosis present

## 2015-12-12 DIAGNOSIS — E876 Hypokalemia: Secondary | ICD-10-CM | POA: Diagnosis present

## 2015-12-12 DIAGNOSIS — Z833 Family history of diabetes mellitus: Secondary | ICD-10-CM | POA: Diagnosis not present

## 2015-12-12 DIAGNOSIS — G9340 Encephalopathy, unspecified: Secondary | ICD-10-CM | POA: Diagnosis present

## 2015-12-12 DIAGNOSIS — R21 Rash and other nonspecific skin eruption: Secondary | ICD-10-CM

## 2015-12-12 DIAGNOSIS — R509 Fever, unspecified: Secondary | ICD-10-CM | POA: Diagnosis not present

## 2015-12-12 DIAGNOSIS — K625 Hemorrhage of anus and rectum: Secondary | ICD-10-CM | POA: Diagnosis present

## 2015-12-12 DIAGNOSIS — R634 Abnormal weight loss: Secondary | ICD-10-CM | POA: Diagnosis present

## 2015-12-12 LAB — CBC WITH DIFFERENTIAL/PLATELET
BASOS ABS: 0 10*3/uL (ref 0.0–0.1)
Basophils Relative: 0 %
EOS ABS: 0 10*3/uL (ref 0.0–0.7)
Eosinophils Relative: 0 %
HEMATOCRIT: 38.8 % (ref 36.0–46.0)
HEMOGLOBIN: 12.9 g/dL (ref 12.0–15.0)
LYMPHS ABS: 79.3 10*3/uL — AB (ref 0.7–4.0)
LYMPHS PCT: 83 %
MCH: 23.5 pg — ABNORMAL LOW (ref 26.0–34.0)
MCHC: 33.2 g/dL (ref 30.0–36.0)
MCV: 70.8 fL — ABNORMAL LOW (ref 78.0–100.0)
MONOS PCT: 2 %
Monocytes Absolute: 1.9 10*3/uL — ABNORMAL HIGH (ref 0.1–1.0)
Neutro Abs: 14.3 10*3/uL — ABNORMAL HIGH (ref 1.7–7.7)
Neutrophils Relative %: 15 %
Platelets: 126 10*3/uL — ABNORMAL LOW (ref 150–400)
RBC: 5.48 MIL/uL — AB (ref 3.87–5.11)
RDW: 14.8 % (ref 11.5–15.5)
WBC: 95.5 10*3/uL — AB (ref 4.0–10.5)

## 2015-12-12 LAB — URINALYSIS, ROUTINE W REFLEX MICROSCOPIC
Glucose, UA: NEGATIVE mg/dL
Ketones, ur: 15 mg/dL — AB
NITRITE: NEGATIVE
PH: 5.5 (ref 5.0–8.0)
Protein, ur: 30 mg/dL — AB
SPECIFIC GRAVITY, URINE: 1.025 (ref 1.005–1.030)

## 2015-12-12 LAB — COMPREHENSIVE METABOLIC PANEL
ALT: 15 U/L (ref 14–54)
AST: 20 U/L (ref 15–41)
Albumin: 3.8 g/dL (ref 3.5–5.0)
Alkaline Phosphatase: 52 U/L (ref 38–126)
Anion gap: 10 (ref 5–15)
BUN: 15 mg/dL (ref 6–20)
CHLORIDE: 95 mmol/L — AB (ref 101–111)
CO2: 23 mmol/L (ref 22–32)
Calcium: 8.4 mg/dL — ABNORMAL LOW (ref 8.9–10.3)
Creatinine, Ser: 0.95 mg/dL (ref 0.44–1.00)
GFR calc Af Amer: >60 mL/min (ref >60)
GFR calc non Af Amer: 53 mL/min — ABNORMAL LOW (ref >60)
GLUCOSE: 143 mg/dL — AB (ref 65–99)
Potassium: 3.7 mmol/L (ref 3.5–5.1)
SODIUM: 128 mmol/L — AB (ref 135–145)
TOTAL PROTEIN: 5.9 g/dL — AB (ref 6.5–8.1)
Total Bilirubin: 1.8 mg/dL — ABNORMAL HIGH (ref 0.3–1.2)

## 2015-12-12 LAB — LIPASE, BLOOD: LIPASE: 13 U/L (ref 11–51)

## 2015-12-12 LAB — URINE MICROSCOPIC-ADD ON

## 2015-12-12 LAB — I-STAT CG4 LACTIC ACID, ED: LACTIC ACID, VENOUS: 1.23 mmol/L (ref 0.5–2.0)

## 2015-12-12 MED ORDER — DEXTROSE 5 % IV SOLN
2.0000 g | Freq: Once | INTRAVENOUS | Status: AC
  Administered 2015-12-12: 2 g via INTRAVENOUS
  Filled 2015-12-12: qty 2

## 2015-12-12 MED ORDER — ACETAMINOPHEN 325 MG PO TABS
650.0000 mg | ORAL_TABLET | Freq: Once | ORAL | Status: AC
  Administered 2015-12-12: 650 mg via ORAL
  Filled 2015-12-12: qty 2

## 2015-12-12 MED ORDER — SODIUM CHLORIDE 0.9 % IV SOLN
INTRAVENOUS | Status: DC
Start: 2015-12-12 — End: 2015-12-14
  Administered 2015-12-12 – 2015-12-13 (×2): via INTRAVENOUS
  Administered 2015-12-14: 125 mL via INTRAVENOUS

## 2015-12-12 MED ORDER — METRONIDAZOLE IN NACL 5-0.79 MG/ML-% IV SOLN: 500.0000 mg | Freq: Three times a day (TID) | INTRAVENOUS | Status: DC

## 2015-12-12 MED ORDER — SODIUM CHLORIDE 0.9 % IV BOLUS (SEPSIS)
500.0000 mL | Freq: Once | INTRAVENOUS | Status: AC
  Administered 2015-12-12: 500 mL via INTRAVENOUS

## 2015-12-12 MED ORDER — DIPHENHYDRAMINE HCL 25 MG PO CAPS
25.0000 mg | ORAL_CAPSULE | Freq: Once | ORAL | Status: AC
  Administered 2015-12-12: 25 mg via ORAL
  Filled 2015-12-12: qty 1

## 2015-12-12 MED ORDER — SODIUM CHLORIDE 0.9 % IV BOLUS (SEPSIS): 250.0000 mL | Freq: Once | INTRAVENOUS | Status: DC

## 2015-12-12 MED ORDER — CEFTRIAXONE SODIUM 1 G IJ SOLR
1.0000 g | INTRAMUSCULAR | Status: DC
  Administered 2015-12-13 – 2015-12-14 (×2): 1 g via INTRAVENOUS
  Filled 2015-12-12 (×2): qty 10

## 2015-12-12 MED ORDER — SODIUM CHLORIDE 0.9 % IV BOLUS (SEPSIS)
1000.0000 mL | Freq: Once | INTRAVENOUS | Status: AC
  Administered 2015-12-12: 1000 mL via INTRAVENOUS

## 2015-12-12 MED ORDER — METHYLPREDNISOLONE SODIUM SUCC 125 MG IJ SOLR
80.0000 mg | Freq: Once | INTRAMUSCULAR | Status: AC
  Administered 2015-12-12: 80 mg via INTRAVENOUS
  Filled 2015-12-12: qty 2

## 2015-12-12 MED ORDER — METRONIDAZOLE IN NACL 5-0.79 MG/ML-% IV SOLN
500.0000 mg | Freq: Once | INTRAVENOUS | Status: AC
  Administered 2015-12-12: 500 mg via INTRAVENOUS
  Filled 2015-12-12: qty 100

## 2015-12-12 NOTE — ED Notes (Signed)
o2 2 l [place]  Family states she is on o2 at home

## 2015-12-12 NOTE — ED Notes (Signed)
MD at bedside.  Peripheral IV's being started and labs collected.

## 2015-12-12 NOTE — ED Notes (Addendum)
Via [facility]--spoke with Phil----code sepsis

## 2015-12-12 NOTE — ED Notes (Signed)
Pt placed on auto vitals Q15. Patient placed on cardiac monitor.

## 2015-12-12 NOTE — ED Notes (Signed)
Patient was dx with a UTI recent and placed on ammoxicillin. The patient returned to her MD today with a rash and continued fevers. The patient also having Fatigue, confusion and Abdominal pain. The patient sent her from MD's office for possible admission.

## 2015-12-12 NOTE — Patient Instructions (Addendum)
With your CLL,  high fever, abdomen pain, hx of uti, fatigue, confusion and recent rash, I do think it is best for you to be evaluated in the ED. You will need stat labs, work up and treatment. I talked with the emergency dept MD and she knows you are on your way down.  Follow up with [place] as determined by ED or by hospital if they admit  you.

## 2015-12-12 NOTE — Progress Notes (Addendum)
Subjective:    Patient ID: Stefanie Henry, female    DOB: 12-20-1929, 82 y.o.   MRN: [id number]  HPI  Pt in with her son. Pt had fever of 103 yesterday. Came down to 102 yesterday. Fever continues today. Pt has some stomach pain. She has history of diverticulitis. Pt also had uti. Pt rash came up yesterday per daughter in law. Son saw rash today. Pt is very sleepy/tired.Pt is swollen on her lips and some on her legs/feet.  Some itching under her cheeks. No sob.  Pt was admitted for diverticulitis recently. Discharge and followed up with myself.  Some moderate epigastric pain now.  Some lethargy and little confused today.  Last 24-48 hours has eating very little.  Pt had uti 3 wks ago. Bacteria was sensitive to pcn. Resitant to quinolones. Pt son gave her antibiotic yesterday. Some delay in treatment. Advised to start antibiotic 3 wks ago.    Review of Systems  Constitutional: Positive for fever and fatigue.  HENT: Negative for dental problem and ear pain.   Respiratory: Negative for cough, chest tightness and wheezing.   Cardiovascular: Negative for chest pain and palpitations.  Gastrointestinal: Positive for abdominal pain. Negative for nausea, vomiting, diarrhea, constipation, blood in stool, abdominal distention and rectal pain.  Musculoskeletal: Negative for myalgias and back pain.  Skin: Positive for rash. Negative for pallor.  Neurological: Negative for dizziness, seizures, syncope, weakness, light-headedness and numbness.  Hematological: Negative for adenopathy. Does not bruise/bleed easily.  Psychiatric/Behavioral: Positive for confusion. Negative for behavioral problems, self-injury, dysphoric mood, decreased concentration and agitation. The patient is not nervous/anxious and is not hyperactive.     Past Medical History  Diagnosis Date  . Asthma   . Arthritis   . Shingles   . Anemia, iron deficiency 06/06/2012  . Chronic lymphatic leukemia ([place])   . Gallstones   .  Diverticulitis   . COPD (chronic obstructive pulmonary disease) ([place])   . Cataract   . Blood transfusion without reported diagnosis   . GERD (gastroesophageal reflux disease)   . Neuromuscular disorder [facility])      Social History   Social History  . Marital Status: Married    Spouse Name: N/A  . Number of Children: 2  . Years of Education: N/A   Occupational History  . Retired    Social History Main Topics  . Smoking status: Never Smoker   . Smokeless tobacco: Never Used     Comment: PT NEVER SMOKED  . Alcohol Use: No  . Drug Use: No  . Sexual Activity: Not on file   Other Topics Concern  . Not on file   Social History Narrative    Past Surgical History  Procedure Laterality Date  . None    . Esophagogastroduodenoscopy N/A 11/07/2014    Procedure: ESOPHAGOGASTRODUODENOSCOPY (EGD);  Surgeon: Gatha Mayer, MD;  Location: [facility];  Service: Endoscopy;  Laterality: N/A;    Family History  Problem Relation Age of Onset  . Diabetes Father   . Stomach cancer Sister   . Colon cancer Neg Hx   . Colon polyps Neg Hx   . Esophageal cancer Neg Hx   . Gallbladder disease Mother     Allergies  Allergen Reactions  . Contrast Media [Iodinated Diagnostic Agents] Other (See Comments)    Stomach discomfort  . Fruit & Vegetable Daily [Nutritional Supplements] Other (See Comments)    Citrus fruit causes stomach discomfort  . Ibuprofen Other (See Comments)    Stomach  discomfort  . Other     Cannot take any antibiotics, causes rash    Current Outpatient Prescriptions on File Prior to Visit  Medication Sig Dispense Refill  . albuterol (PROVENTIL) (2.5 MG/3ML) 0.083% nebulizer solution Take 3 mLs (2.5 mg total) by nebulization 2 (two) times daily. 180 mL 3  . albuterol (VENTOLIN HFA) 108 (90 Base) MCG/ACT inhaler INHALE 1-2 PUFFS INTO THE LUNGS EVERY 6 HOURS AS NEEDED FOR WHEEZING OR SHORTNESS OF BREATH. 18 Inhaler 0  . amoxicillin-clavulanate (AUGMENTIN) 875-125 MG tablet  Take 1 tablet by mouth 2 (two) times daily. 14 tablet 0  . calcitonin, salmon, (MIACALCIN/FORTICAL) 200 UNIT/ACT nasal spray Place 1 spray into alternate nostrils daily. 3.7 mL 12  . gabapentin (NEURONTIN) 100 MG capsule TAKE 2 CAPSULES BY MOUTH 3 TIMES A DAY AS NEEDED FOR NERVE PAIN 180 capsule 3  . hyoscyamine (LEVSIN SL) 0.125 MG SL tablet PLACE 1 TABLET (0.125 MG TOTAL) UNDER THE TONGUE EVERY 8 (EIGHT) HOURS AS NEEDED. 60 tablet 2  . mometasone-formoterol (DULERA) 200-5 MCG/ACT AERO Inhale 2 puffs into the lungs 2 (two) times daily. 1 Inhaler 5  . NEOMYCIN-POLYMYXIN-HYDROCORTISONE (CORTISPORIN) 1 % SOLN otic solution PLACE 3 DROPS INTO THE LEFT EAR EVERY 8 (EIGHT) HOURS. 10 mL 0  . omeprazole (PRILOSEC) 20 MG capsule Take 1 capsule (20 mg total) by mouth daily. 30 capsule 3  . predniSONE (DELTASONE) 10 MG tablet TAKE 1 TABLET BY MOUTH EVERY DAY 30 tablet 0  . saccharomyces boulardii (FLORASTOR) 250 MG capsule Take 1 capsule (250 mg total) by mouth 2 (two) times daily. 60 capsule 1  . senna (SENOKOT) 8.6 MG TABS tablet Take 1-2 tablets by mouth daily as needed for mild constipation.    . Simethicone (GAS-X PO) Take 2 capsules by mouth daily.     . sucralfate (CARAFATE) 1 G tablet TAKE 2 TABLETS IN THE MORNING AND AT BEDTIME 120 tablet 11   No current facility-administered medications on file prior to visit.    BP 100/70 mmHg  Pulse 61  Temp(Src) 102.5 F (39.2 C) (Oral)  Ht 4\' 7"  (1.397 m)  Wt 116 lb 3.2 oz (52.708 kg)  BMI 27.01 kg/m2  SpO2 98%       Objective:   Physical Exam  General Appearance- Not in acute distress but does appeared tired. Confusion some today per son.  HEENT Eyes- Scleraeral/Conjuntiva-bilat- Not Yellow. Mouth & Throat- Normal.  Chest and Lung Exam Auscultation: Breath sounds:-Normal. Adventitious sounds:- No Adventitious sounds.  Cardiovascular Auscultation:Rythm - Regular. Heart Sounds -Normal heart sounds.  Abdomen Inspection:-Inspection  Normal.  Palpation/Perucssion: Palpation and Percussion of the abdomen reveal- Non Tender, No Rebound tenderness, No rigidity(Guarding) and No Palpable abdominal masses.  Liver:-Normal.  Spleen:- Normal.    Back- slight faint rt cva tenderness.  Diffuse red rash and some swelling of legs. Son thinks lips swollen some.      Assessment & Plan:  With your CLL,  high fever, abdomen pain, hx of uti, fatigue, confusion and recent rash, I do think it is best for you to be evaluated in the ED. You will need stat labs, work up and treatment. I talked with the emergency dept MD and she knows you are on your way down.  Follow up with [place] as determined by ED or by hospital if they admit  you.   Kyisha Fowle, Percell Miller, PA-C

## 2015-12-12 NOTE — ED Provider Notes (Signed)
CSN: [id number]     Arrival date & time 12/12/15  1549 History   First MD Initiated Contact with Patient 12/12/15 1600     Chief Complaint  Patient presents with  . Abdominal Pain     (Consider location/radiation/quality/duration/timing/severity/associated sxs/prior Treatment) HPI  82 year old female with a history of CLL presents from her clinic visit with fever. History taken from the son. Patient has had fever since yesterday. Was diagnosed with UTI a few weeks ago but the son just picked up antibiotics and started giving them to the patient yesterday. Has now had 2 or 3 doses of amoxicillin. Patient has also started to have diffuse redness and some swelling in her lips. No shortness of breath. No cough. Complains of intermittent abdominal pain that the son can't tell if it's gas or not. Is on her left lower abdomen and she has had diverticulitis before. No complaints of back pain. Maximum temperature was 103 yesterday. Has not been on chemotherapy for the last couple years. Has had some confusion today, mild. No known history of allergies to penicillins.  Past Medical History  Diagnosis Date  . Asthma   . Arthritis   . Shingles   . Anemia, iron deficiency 06/06/2012  . Chronic lymphatic leukemia ([place])   . Gallstones   . Diverticulitis   . COPD (chronic obstructive pulmonary disease) ([place])   . Cataract   . Blood transfusion without reported diagnosis   . GERD (gastroesophageal reflux disease)   . Neuromuscular disorder [facility])    Past Surgical History  Procedure Laterality Date  . None    . Esophagogastroduodenoscopy N/A 11/07/2014    Procedure: ESOPHAGOGASTRODUODENOSCOPY (EGD);  Surgeon: Gatha Mayer, MD;  Location: [facility];  Service: Endoscopy;  Laterality: N/A;   Family History  Problem Relation Age of Onset  . Diabetes Father   . Stomach cancer Sister   . Colon cancer Neg Hx   . Colon polyps Neg Hx   . Esophageal cancer Neg Hx   . Gallbladder disease Mother     Social History  Substance Use Topics  . Smoking status: Never Smoker   . Smokeless tobacco: Never Used     Comment: PT NEVER SMOKED  . Alcohol Use: No   OB History    No data available     Review of Systems  Unable to perform ROS: Mental status change      Allergies  Contrast media; Fruit & vegetable daily; Ibuprofen; and Other  Home Medications   Prior to Admission medications   Medication Sig Start Date End Date Taking? Authorizing Provider  albuterol (PROVENTIL) (2.5 MG/3ML) 0.083% nebulizer solution Take 3 mLs (2.5 mg total) by nebulization 2 (two) times daily. 12/02/15   Tammy S Parrett, NP  albuterol (VENTOLIN HFA) 108 (90 Base) MCG/ACT inhaler INHALE 1-2 PUFFS INTO THE LUNGS EVERY 6 HOURS AS NEEDED FOR WHEEZING OR SHORTNESS OF BREATH. 12/02/15   Tammy S Parrett, NP  amoxicillin-clavulanate (AUGMENTIN) 875-125 MG tablet Take 1 tablet by mouth 2 (two) times daily. 11/20/15   Percell Miller Saguier, PA-C  calcitonin, salmon, (MIACALCIN/FORTICAL) 200 UNIT/ACT nasal spray Place 1 spray into alternate nostrils daily. 11/18/15   Percell Miller Saguier, PA-C  gabapentin (NEURONTIN) 100 MG capsule TAKE 2 CAPSULES BY MOUTH 3 TIMES A DAY AS NEEDED FOR NERVE PAIN 06/19/15   Mackie Pai, PA-C  hyoscyamine (LEVSIN SL) 0.125 MG SL tablet PLACE 1 TABLET (0.125 MG TOTAL) UNDER THE TONGUE EVERY 8 (EIGHT) HOURS AS NEEDED. 11/18/15   Mackie Pai,  PA-C  mometasone-formoterol (DULERA) 200-5 MCG/ACT AERO Inhale 2 puffs into the lungs 2 (two) times daily. 12/02/15   Tammy S Parrett, NP  NEOMYCIN-POLYMYXIN-HYDROCORTISONE (CORTISPORIN) 1 % SOLN otic solution PLACE 3 DROPS INTO THE LEFT EAR EVERY 8 (EIGHT) HOURS. 08/12/15   Mackie Pai, PA-C  omeprazole (PRILOSEC) 20 MG capsule Take 1 capsule (20 mg total) by mouth daily. 11/18/15   Edward Saguier, PA-C  predniSONE (DELTASONE) 10 MG tablet TAKE 1 TABLET BY MOUTH EVERY DAY 12/09/15   [name], MD  saccharomyces boulardii (FLORASTOR) 250 MG capsule Take 1  capsule (250 mg total) by mouth 2 (two) times daily. 11/13/14   Theodis Blaze, MD  senna (SENOKOT) 8.6 MG TABS tablet Take 1-2 tablets by mouth daily as needed for mild constipation.    Historical Provider, MD  Simethicone (GAS-X PO) Take 2 capsules by mouth daily.     Historical Provider, MD  sucralfate (CARAFATE) 1 G tablet TAKE 2 TABLETS IN THE MORNING AND AT BEDTIME 04/18/15   Ladene Artist, MD   BP 93/65 mmHg  Pulse 124  Temp(Src) 102.9 F (39.4 C) (Oral)  Resp 16  Ht 4\' 8"  (1.422 m)  Wt 116 lb (52.617 kg)  BMI 26.02 kg/m2  SpO2 96% Physical Exam  Constitutional: She is oriented to person, place, and time. She appears well-developed and well-nourished.  HENT:  Head: Normocephalic and atraumatic.  Right Ear: External ear normal.  Left Ear: External ear normal.  Nose: Nose normal.  Mouth/Throat: Oropharynx is clear and moist.  No obvious lip or oropharyngeal swelling  Eyes: Right eye exhibits no discharge. Left eye exhibits no discharge.  Neck: Neck supple.  Cardiovascular: Regular rhythm and normal heart sounds.  Tachycardia present.   Pulmonary/Chest: Effort normal and breath sounds normal.  Abdominal: Soft. There is tenderness (mild) in the left upper quadrant and left lower quadrant. There is no CVA tenderness.  Neurological: She is alert and oriented to person, place, and time.  Skin: Skin is warm and dry. She is not diaphoretic. There is erythema.  Diffuse redness, especially to chest, back, abdomen, anterior neck, and upper extremities. No skin sloughing.  Nursing note and vitals reviewed.   ED Course  Procedures (including critical care time) Labs Review Labs Reviewed  COMPREHENSIVE METABOLIC PANEL - Abnormal; Notable for the following:    Sodium 128 (*)    Chloride 95 (*)    Glucose, Bld 143 (*)    Calcium 8.4 (*)    Total Protein 5.9 (*)    Total Bilirubin 1.8 (*)    GFR calc non Af Amer 53 (*)    All other components within normal limits  CBC WITH  DIFFERENTIAL/PLATELET - Abnormal; Notable for the following:    WBC 95.5 (*)    RBC 5.48 (*)    MCV 70.8 (*)    MCH 23.5 (*)    Platelets 126 (*)    Neutro Abs 14.3 (*)    Lymphs Abs 79.3 (*)    Monocytes Absolute 1.9 (*)    All other components within normal limits  URINALYSIS, ROUTINE W REFLEX MICROSCOPIC (NOT AT [facility]) - Abnormal; Notable for the following:    Color, Urine ORANGE (*)    APPearance CLOUDY (*)    Hgb urine dipstick TRACE (*)    Bilirubin Urine SMALL (*)    Ketones, ur 15 (*)    Protein, ur 30 (*)    Leukocytes, UA MODERATE (*)    All other components within  normal limits  URINE MICROSCOPIC-ADD ON - Abnormal; Notable for the following:    Squamous Epithelial / LPF 0-5 (*)    Bacteria, UA MANY (*)    All other components within normal limits  CULTURE, BLOOD (ROUTINE X 2)  CULTURE, BLOOD (ROUTINE X 2)  URINE CULTURE  LIPASE, BLOOD  I-STAT CG4 LACTIC ACID, ED    Imaging Review Ct Abdomen Pelvis Wo Contrast  12/12/2015  CLINICAL DATA:  Fatigue, confusion abdominal pain. History of UTI and diverticulitis. EXAM: CT ABDOMEN AND PELVIS WITHOUT CONTRAST TECHNIQUE: Multidetector CT imaging of the abdomen and pelvis was performed following the standard protocol without IV contrast. COMPARISON:  11/09/2015 FINDINGS: Lower chest: No acute findings. Linear opacity in the left lower lobe likely represent scarring or atelectasis. Hepatobiliary: No mass visualized on this un-enhanced exam. Pancreas: No mass or inflammatory process identified on this un-enhanced exam. Spleen: Within normal limits in size. Adrenals/Urinary Tract: No evidence of urolithiasis or hydronephrosis. No definite mass visualized on this un-enhanced exam. Stomach/Bowel: No evidence of obstruction or abnormal fluid collections. There is a smooth wall thickening of the cecum. There is a scatter left colonic diverticulosis without evidence of diverticulitis. Vascular/Lymphatic: No pathologically enlarged lymph nodes.  No evidence of abdominal aortic aneurysm. Reproductive: Leiomyomatous uterus. Other: None. Musculoskeletal: No suspicious bone lesions identified. There is a stable compression deformity of L1 vertebral body, on the background of advanced multilevel osteoarthritic changes of the lumbosacral spine. IMPRESSION: No evidence of obstructive uropathy. No evidence of acute abnormalities within the solid abdominal organs, given lack of IV contrast. A smooth wall thickening of the cecum, with infectious or inflammatory appearance. Scattered left colonic diverticulosis without evidence of diverticulitis. Leiomyomatous uterus. Electronically Signed   By: Fidela Salisbury M.D.   On: 12/12/2015 18:44   Dg Chest Port 1 View  12/12/2015  CLINICAL DATA:  Sepsis. EXAM: PORTABLE CHEST 1 VIEW COMPARISON:  11/09/2015.  08/10/2015.  02/12/2015. FINDINGS: Mediastinum and hilar structures normal. Stable changes of right middle lobe atelectasis and/or scarring. No pleural effusion or pneumothorax. No acute bony abnormality. IMPRESSION: 1. Stable changes of right middle lobe atelectasis and/or scarring . 2.  No acute cardiopulmonary disease. Electronically Signed   By: Marcello Moores  Register   On: 12/12/2015 17:13   I have personally reviewed and evaluated these images and lab results as part of my medical decision-making.   EKG Interpretation   Date/Time:  Thursday December 12 2015 16:28:31 EDT Ventricular Rate:  112 PR Interval:  145 QRS Duration: 76 QT Interval:  295 QTC Calculation: 403 R Axis:   94 Text Interpretation:  Sinus tachycardia Right axis deviation Low voltage,  precordial leads besides rate, no significant change since Nov 09 2015  Confirmed by Regenia Skeeter MD, Azarie Coriz [phone]) on 12/12/2015 4:34:39 PM      CRITICAL CARE Performed by: Sherwood Gambler T   Total critical care time: 30 minutes  Critical care time was exclusive of separately billable procedures and treating other patients.  Critical care was  necessary to treat or prevent imminent or life-threatening deterioration.  Critical care was time spent personally by me on the following activities: development of treatment plan with patient and/or surrogate as well as nursing, discussions with consultants, evaluation of patient's response to treatment, examination of patient, obtaining history from patient or surrogate, ordering and performing treatments and interventions, ordering and review of laboratory studies, ordering and review of radiographic studies, pulse oximetry and re-evaluation of patient's condition.  MDM   Final diagnoses:  Sepsis, due  to unspecified organism [facility])    Patient's presentation is consistent with sepsis. Appears to be from a urinary source. She has mild confusion but is currently awake and alert and acting appropriate in the ER. Blood pressure is borderline low in the 90s and low 100s. Family states this is normal for her, even in the 90s. She was given sepsis protocol fluids. Lactate is normal. White blood cell count is severely elevated, CLL is obviously contributing. At this point patient will be treated for urosepsis. CT does not show any evidence of diverticulitis or other acute abdominal pathology. She does have significant erythema that is likely an allergic reaction to amoxicillin. Discussed with Dr. Eulas Post who will admit to [facility] step down unit. She recommends Solu-Medrol as well.    Sherwood Gambler, MD 12/12/15 [phone]

## 2015-12-12 NOTE — ED Notes (Signed)
Patient in restroom at this time, MD at bedside.

## 2015-12-12 NOTE — Progress Notes (Signed)
Pre visit review using our clinic review tool, if applicable. No additional management support is needed unless otherwise documented below in the visit note. 

## 2015-12-12 NOTE — ED Notes (Signed)
Patient transported to CT 

## 2015-12-12 NOTE — ED Notes (Signed)
Pt/family reports pt on home O2, requesting O2 at this time

## 2015-12-13 DIAGNOSIS — N3000 Acute cystitis without hematuria: Secondary | ICD-10-CM

## 2015-12-13 DIAGNOSIS — N390 Urinary tract infection, site not specified: Secondary | ICD-10-CM

## 2015-12-13 DIAGNOSIS — A419 Sepsis, unspecified organism: Secondary | ICD-10-CM

## 2015-12-13 HISTORY — DX: Urinary tract infection, site not specified: N39.0

## 2015-12-13 LAB — URINE CULTURE: Culture: NO GROWTH

## 2015-12-13 LAB — PROTIME-INR
INR: 1.41 (ref 0.00–1.49)
PROTHROMBIN TIME: 16.9 s — AB (ref 11.6–15.2)

## 2015-12-13 LAB — CBC
HEMATOCRIT: 29.3 % — AB (ref 36.0–46.0)
HEMATOCRIT: 31.9 % — AB (ref 36.0–46.0)
Hemoglobin: 10.4 g/dL — ABNORMAL LOW (ref 12.0–15.0)
Hemoglobin: 9.5 g/dL — ABNORMAL LOW (ref 12.0–15.0)
MCH: 23.1 pg — ABNORMAL LOW (ref 26.0–34.0)
MCH: 23.2 pg — ABNORMAL LOW (ref 26.0–34.0)
MCHC: 32.4 g/dL (ref 30.0–36.0)
MCHC: 32.6 g/dL (ref 30.0–36.0)
MCV: 70.9 fL — AB (ref 78.0–100.0)
MCV: 71.6 fL — AB (ref 78.0–100.0)
PLATELETS: 71 10*3/uL — AB (ref 150–400)
PLATELETS: 74 10*3/uL — AB (ref 150–400)
RBC: 4.5 MIL/uL (ref 3.87–5.11)
RBC: 4.09 MIL/uL (ref 3.87–5.11)
RDW: 14.2 % (ref 11.5–15.5)
RDW: 14.5 % (ref 11.5–15.5)
WBC: 45 10*3/uL — AB (ref 4.0–10.5)
WBC: 54.1 10*3/uL — AB (ref 4.0–10.5)

## 2015-12-13 LAB — BASIC METABOLIC PANEL
ANION GAP: 6 (ref 5–15)
BUN: 11 mg/dL (ref 6–20)
CALCIUM: 7.4 mg/dL — AB (ref 8.9–10.3)
CO2: 21 mmol/L — AB (ref 22–32)
CREATININE: 0.5 mg/dL (ref 0.44–1.00)
Chloride: 105 mmol/L (ref 101–111)
GLUCOSE: 190 mg/dL — AB (ref 65–99)
Potassium: 3.8 mmol/L (ref 3.5–5.1)
Sodium: 132 mmol/L — ABNORMAL LOW (ref 135–145)

## 2015-12-13 LAB — MRSA PCR SCREENING: MRSA by PCR: NEGATIVE

## 2015-12-13 LAB — TYPE AND SCREEN
ABO/RH(D): A POS
Antibody Screen: NEGATIVE

## 2015-12-13 LAB — APTT: APTT: 28 s (ref 24–37)

## 2015-12-13 LAB — PROCALCITONIN: PROCALCITONIN: 0.56 ng/mL

## 2015-12-13 LAB — OCCULT BLOOD X 1 CARD TO LAB, STOOL: Fecal Occult Bld: POSITIVE — AB

## 2015-12-13 MED ORDER — ONDANSETRON HCL 4 MG PO TABS: 4.0000 mg | ORAL_TABLET | Freq: Four times a day (QID) | ORAL | Status: DC | PRN

## 2015-12-13 MED ORDER — ACETAMINOPHEN 650 MG RE SUPP: 650.0000 mg | Freq: Four times a day (QID) | RECTAL | Status: DC | PRN

## 2015-12-13 MED ORDER — SIMETHICONE 80 MG PO CHEW
160.0000 mg | CHEWABLE_TABLET | Freq: Every day | ORAL | Status: DC
  Administered 2015-12-13 – 2015-12-15 (×3): 160 mg via ORAL
  Filled 2015-12-13 (×3): qty 2

## 2015-12-13 MED ORDER — SODIUM CHLORIDE 0.9 % IV BOLUS (SEPSIS)
500.0000 mL | Freq: Once | INTRAVENOUS | Status: AC
  Administered 2015-12-13: 500 mL via INTRAVENOUS

## 2015-12-13 MED ORDER — NEOMYCIN-POLYMYXIN-HC 3.5-10000-1 OT SUSP: 3.0000 [drp] | Freq: Three times a day (TID) | OTIC | Status: DC | PRN

## 2015-12-13 MED ORDER — NEOMYCIN-POLYMYXIN-HC 1 % OT SOLN
3.0000 [drp] | Freq: Three times a day (TID) | OTIC | Status: DC
  Filled 2015-12-13: qty 10

## 2015-12-13 MED ORDER — ENSURE ENLIVE PO LIQD
237.0000 mL | ORAL | Status: DC
  Administered 2015-12-13 – 2015-12-14 (×2): 237 mL via ORAL

## 2015-12-13 MED ORDER — CALCITONIN (SALMON) 200 UNIT/ACT NA SOLN
1.0000 | Freq: Every day | NASAL | Status: DC
Start: 2015-12-13 — End: 2015-12-15
  Administered 2015-12-13 – 2015-12-14 (×2): 1 via NASAL
  Filled 2015-12-13: qty 3.7

## 2015-12-13 MED ORDER — PANTOPRAZOLE SODIUM 40 MG PO TBEC
40.0000 mg | DELAYED_RELEASE_TABLET | Freq: Every day | ORAL | Status: DC
  Administered 2015-12-13 – 2015-12-15 (×3): 40 mg via ORAL
  Filled 2015-12-13 (×3): qty 1

## 2015-12-13 MED ORDER — SENNA 8.6 MG PO TABS
1.0000 | ORAL_TABLET | Freq: Every day | ORAL | Status: DC | PRN
  Administered 2015-12-13 – 2015-12-15 (×3): 17.2 mg via ORAL
  Filled 2015-12-13 (×3): qty 2

## 2015-12-13 MED ORDER — GABAPENTIN 100 MG PO CAPS
200.0000 mg | ORAL_CAPSULE | Freq: Three times a day (TID) | ORAL | Status: DC | PRN
  Administered 2015-12-14 (×2): 200 mg via ORAL
  Filled 2015-12-13 (×2): qty 2

## 2015-12-13 MED ORDER — SACCHAROMYCES BOULARDII 250 MG PO CAPS
250.0000 mg | ORAL_CAPSULE | Freq: Two times a day (BID) | ORAL | Status: DC
  Administered 2015-12-13 – 2015-12-15 (×6): 250 mg via ORAL
  Filled 2015-12-13 (×6): qty 1

## 2015-12-13 MED ORDER — SODIUM CHLORIDE 0.9% FLUSH
3.0000 mL | Freq: Two times a day (BID) | INTRAVENOUS | Status: DC
  Administered 2015-12-13 – 2015-12-15 (×5): 3 mL via INTRAVENOUS

## 2015-12-13 MED ORDER — HYOSCYAMINE SULFATE 0.125 MG SL SUBL
0.1250 mg | SUBLINGUAL_TABLET | Freq: Four times a day (QID) | SUBLINGUAL | Status: DC | PRN
  Administered 2015-12-14: 0.125 mg via ORAL
  Filled 2015-12-13 (×3): qty 1

## 2015-12-13 MED ORDER — IPRATROPIUM-ALBUTEROL 0.5-2.5 (3) MG/3ML IN SOLN: 3.0000 mL | RESPIRATORY_TRACT | Status: DC | PRN

## 2015-12-13 MED ORDER — SUCRALFATE 1 G PO TABS
2.0000 g | ORAL_TABLET | Freq: Two times a day (BID) | ORAL | Status: DC
  Administered 2015-12-13 – 2015-12-15 (×6): 2 g via ORAL
  Filled 2015-12-13 (×7): qty 2

## 2015-12-13 MED ORDER — IPRATROPIUM-ALBUTEROL 0.5-2.5 (3) MG/3ML IN SOLN
3.0000 mL | Freq: Three times a day (TID) | RESPIRATORY_TRACT | Status: DC
  Administered 2015-12-14: 3 mL via RESPIRATORY_TRACT
  Filled 2015-12-13: qty 3

## 2015-12-13 MED ORDER — ACETAMINOPHEN 325 MG PO TABS
650.0000 mg | ORAL_TABLET | Freq: Four times a day (QID) | ORAL | Status: DC | PRN
  Administered 2015-12-13 – 2015-12-14 (×2): 650 mg via ORAL
  Filled 2015-12-13 (×2): qty 2

## 2015-12-13 MED ORDER — ONDANSETRON HCL 4 MG/2ML IJ SOLN
4.0000 mg | Freq: Four times a day (QID) | INTRAMUSCULAR | Status: DC | PRN
  Administered 2015-12-13: 4 mg via INTRAVENOUS
  Filled 2015-12-13: qty 2

## 2015-12-13 MED ORDER — PREDNISONE 10 MG PO TABS: 10.0000 mg | ORAL_TABLET | Freq: Every day | ORAL | Status: DC

## 2015-12-13 MED ORDER — MOMETASONE FURO-FORMOTEROL FUM 200-5 MCG/ACT IN AERO
2.0000 | INHALATION_SPRAY | Freq: Two times a day (BID) | RESPIRATORY_TRACT | Status: DC
  Administered 2015-12-13 – 2015-12-14 (×3): 2 via RESPIRATORY_TRACT
  Filled 2015-12-13: qty 8.8

## 2015-12-13 MED ORDER — CETYLPYRIDINIUM CHLORIDE 0.05 % MT LIQD
7.0000 mL | Freq: Two times a day (BID) | OROMUCOSAL | Status: DC
  Administered 2015-12-13 – 2015-12-15 (×5): 7 mL via OROMUCOSAL

## 2015-12-13 NOTE — Progress Notes (Signed)
CRITICAL VALUE ALERT  Critical value received:  WBC 54.1  Date of notification:  12/13/2015  Time of notification:  0123  Critical value read back:Yes.    Nurse who received alert:  Reche Dixon  MD notified (1st page):  Abner Greenspan  Time of first page:  0124  MD notified (2nd page):  Time of second page:  Responding MD:  Abner Greenspan  Time MD responded:  [phone]

## 2015-12-13 NOTE — H&P (Signed)
History and Physical    Stefanie Henry DOB: January 27, 1930 DOA: 12/12/2015  PCP: Mackie Pai, PA-C   Patient coming from: [place]  Chief Complaint: Fever, mental status changes  HPI: Stefanie Henry is a 82 y.o. woman with a history of COPD (she is on home oxygen), CLL (baseline WBC count 45,000 per her son; she does not receive active treatment by hematology), and recurrent diverticulitis who presented to the ED in [facility] with her son for evaluation of fever, mental status changes, and decreased appetite.  The patient was diagnosed with acute diverticulitis and MDR E Coli UTI in May.  According to her son, she has difficulty tolerating PO antibiotics due to chronic GI issues (early satiety, gas, abdominal pain, nausea), so she took flagyl, then levaquin, and was just starting a course of Augmentin in the past two days.  She developed rash, itching, and lip swelling concerning for reaction to the Augment, and she presented to her PCP, who ultimately referred her to the ED in [facility].  She complained of LLQ pain in the ED.  No active nausea or vomiting.  Chronic SOB.  No chest pain.  ED Course: WBC markedly elevated to 95,000.  Documented fever to 103.  Lactic acid normal.  Chest xray negative of acute process.  CT abdomen/pelvis shows smooth wall thickening of the cecum and diverticulosis, but no diverticulitis.  IV Rocephin and aggressive volume resuscitation (weight based, per protocol) given.  Blood and urine cultures pending.  Patient accepted to stepdown unit for borderline blood pressures in the setting of sepsis secondary to UTI.   Review of Systems: As per HPI otherwise 10 point review of systems negative.    Past Medical History  Diagnosis Date  . Asthma   . Arthritis   . Shingles   . Anemia, iron deficiency 06/06/2012  . Chronic lymphatic leukemia ([place])   . Gallstones   . Diverticulitis   . COPD (chronic obstructive pulmonary disease) ([place])   .  Cataract   . Blood transfusion without reported diagnosis   . GERD (gastroesophageal reflux disease)   . Neuromuscular disorder [facility])     Past Surgical History  Procedure Laterality Date  . None    . Esophagogastroduodenoscopy N/A 11/07/2014    Procedure: ESOPHAGOGASTRODUODENOSCOPY (EGD);  Surgeon: Gatha Mayer, MD;  Location: [facility];  Service: Endoscopy;  Laterality: N/A;     reports that she has never smoked. She has never used smokeless tobacco. She reports that she does not drink alcohol or use illicit drugs.  She is a widow.  She has two adult children who share decision making responsibilties.   Allergies  Allergen Reactions  . Contrast Media [Iodinated Diagnostic Agents] Other (See Comments)    Stomach discomfort  . Fruit & Vegetable Daily [Nutritional Supplements] Other (See Comments)    Citrus fruit causes stomach discomfort  . Ibuprofen Other (See Comments)    Stomach discomfort  . Other     Cannot take any antibiotics, causes rash    Family History  Problem Relation Age of Onset  . Diabetes Father   . Stomach cancer Sister   . Colon cancer Neg Hx   . Colon polyps Neg Hx   . Esophageal cancer Neg Hx   . Gallbladder disease Mother      Prior to Admission medications   Medication Sig Start Date End Date Taking? Authorizing Provider  albuterol (PROVENTIL) (2.5 MG/3ML) 0.083% nebulizer solution Take 3 mLs (2.5 mg total)  by nebulization 2 (two) times daily. 12/02/15   Tammy S Parrett, NP  albuterol (VENTOLIN HFA) 108 (90 Base) MCG/ACT inhaler INHALE 1-2 PUFFS INTO THE LUNGS EVERY 6 HOURS AS NEEDED FOR WHEEZING OR SHORTNESS OF BREATH. 12/02/15   Tammy S Parrett, NP  amoxicillin-clavulanate (AUGMENTIN) 875-125 MG tablet Take 1 tablet by mouth 2 (two) times daily. 11/20/15   Percell Miller Saguier, PA-C  calcitonin, salmon, (MIACALCIN/FORTICAL) 200 UNIT/ACT nasal spray Place 1 spray into alternate nostrils daily. 11/18/15   Percell Miller Saguier, PA-C  gabapentin (NEURONTIN) 100 MG  capsule TAKE 2 CAPSULES BY MOUTH 3 TIMES A DAY AS NEEDED FOR NERVE PAIN 06/19/15   Mackie Pai, PA-C  hyoscyamine (LEVSIN SL) 0.125 MG SL tablet PLACE 1 TABLET (0.125 MG TOTAL) UNDER THE TONGUE EVERY 8 (EIGHT) HOURS AS NEEDED. 11/18/15   Mackie Pai, PA-C  mometasone-formoterol (DULERA) 200-5 MCG/ACT AERO Inhale 2 puffs into the lungs 2 (two) times daily. 12/02/15   Tammy S Parrett, NP  NEOMYCIN-POLYMYXIN-HYDROCORTISONE (CORTISPORIN) 1 % SOLN otic solution PLACE 3 DROPS INTO THE LEFT EAR EVERY 8 (EIGHT) HOURS. 08/12/15   Mackie Pai, PA-C  omeprazole (PRILOSEC) 20 MG capsule Take 1 capsule (20 mg total) by mouth daily. 11/18/15   Edward Saguier, PA-C  predniSONE (DELTASONE) 10 MG tablet TAKE 1 TABLET BY MOUTH EVERY DAY 12/09/15   [name], MD  saccharomyces boulardii (FLORASTOR) 250 MG capsule Take 1 capsule (250 mg total) by mouth 2 (two) times daily. 11/13/14   Theodis Blaze, MD  senna (SENOKOT) 8.6 MG TABS tablet Take 1-2 tablets by mouth daily as needed for mild constipation.    Historical Provider, MD  Simethicone (GAS-X PO) Take 2 capsules by mouth daily.     Historical Provider, MD  sucralfate (CARAFATE) 1 G tablet TAKE 2 TABLETS IN THE MORNING AND AT BEDTIME 04/18/15   Ladene Artist, MD    Physical Exam: Filed Vitals:   12/12/15 2100 12/12/15 2146 12/12/15 2256 12/13/15 0000  BP: 91/42 106/65 107/40 104/47  Pulse: 101 103 102 93  Temp:   98.8 F (37.1 C)   TempSrc:   Oral   Resp: 29 24 21 18   Height:      Weight:      SpO2: 96% 99% 94% 99%      Constitutional: NAD, calm, comfortable Filed Vitals:   12/12/15 2100 12/12/15 2146 12/12/15 2256 12/13/15 0000  BP: 91/42 106/65 107/40 104/47  Pulse: 101 103 102 93  Temp:   98.8 F (37.1 C)   TempSrc:   Oral   Resp: 29 24 21 18   Height:      Weight:      SpO2: 96% 99% 94% 99%   Eyes: PERRL, lids and conjunctivae normal ENMT: Mucous membranes are moist. Posterior pharynx clear of any exudate or lesions.Normal  dentition.  Neck: normal, supple Respiratory: clear to auscultation bilaterally, no wheezing, no crackles. Normal respiratory effort. No accessory muscle use.  Cardiovascular: Regular rate and rhythm, no murmurs / rubs / gallops. No extremity edema. 2+ pedal pulses.  Abdomen: no tenderness, mildly protuberant but not firm.  No masses palpated. Bowel sounds positive.  Musculoskeletal: nNo joint deformity upper and lower extremities. Good ROM, no contractures. Normal muscle tone.  Skin: no rashes, warm and dry Neurologic: No focal deficits Psychiatric: Normal mood, Oriented to person.   Labs on Admission: I have personally reviewed following labs and imaging studies  CBC:  Recent Labs Lab 12/12/15 1620  WBC 95.5*  NEUTROABS 14.3*  HGB 12.9  HCT 38.8  MCV 70.8*  PLT 123XX123*   Basic Metabolic Panel:  Recent Labs Lab 12/12/15 1620  NA 128*  K 3.7  CL 95*  CO2 23  GLUCOSE 143*  BUN 15  CREATININE 0.95  CALCIUM 8.4*   GFR: Estimated Creatinine Clearance: 29.8 mL/min (by C-G formula based on Cr of 0.95). Liver Function Tests:  Recent Labs Lab 12/12/15 1620  AST 20  ALT 15  ALKPHOS 52  BILITOT 1.8*  PROT 5.9*  ALBUMIN 3.8    Recent Labs Lab 12/12/15 1620  LIPASE 13   Urine analysis:    Component Value Date/Time   COLORURINE ORANGE* 12/12/2015 1614   APPEARANCEUR CLOUDY* 12/12/2015 1614   LABSPEC 1.025 12/12/2015 1614   PHURINE 5.5 12/12/2015 1614   GLUCOSEU NEGATIVE 12/12/2015 1614   HGBUR TRACE* 12/12/2015 1614   BILIRUBINUR SMALL* 12/12/2015 1614   BILIRUBINUR neg 11/18/2015 1539   KETONESUR 15* 12/12/2015 1614   PROTEINUR 30* 12/12/2015 1614   PROTEINUR neg 11/18/2015 1539   UROBILINOGEN 0.2 11/18/2015 1539   UROBILINOGEN 0.2 02/12/2015 1116   NITRITE NEGATIVE 12/12/2015 1614   NITRITE 1+ 11/18/2015 1539   LEUKOCYTESUR MODERATE* 12/12/2015 1614   Sepsis Labs:  Lactic acid  Recent Results (from the past 240 hour(s))  Culture, blood (Routine x  2)     Status: None (Preliminary result)   Collection Time: 12/12/15  4:20 PM  Result Value Ref Range Status   Specimen Description BLOOD RIGHT ANTECUBITAL  Final   Special Requests BOTTLES DRAWN AEROBIC AND ANAEROBIC [place]  Final   Culture PENDING  Incomplete   Report Status PENDING  Incomplete  Culture, blood (Routine x 2)     Status: None (Preliminary result)   Collection Time: 12/12/15  4:30 PM  Result Value Ref Range Status   Specimen Description BLOOD LEFT HAND  Final   Special Requests BOTTLES DRAWN AEROBIC AND ANAEROBIC 5CC  Final   Culture PENDING  Incomplete   Report Status PENDING  Incomplete     Radiological Exams on Admission: Ct Abdomen Pelvis Wo Contrast  12/12/2015  CLINICAL DATA:  Fatigue, confusion abdominal pain. History of UTI and diverticulitis. EXAM: CT ABDOMEN AND PELVIS WITHOUT CONTRAST TECHNIQUE: Multidetector CT imaging of the abdomen and pelvis was performed following the standard protocol without IV contrast. COMPARISON:  11/09/2015 FINDINGS: Lower chest: No acute findings. Linear opacity in the left lower lobe likely represent scarring or atelectasis. Hepatobiliary: No mass visualized on this un-enhanced exam. Pancreas: No mass or inflammatory process identified on this un-enhanced exam. Spleen: Within normal limits in size. Adrenals/Urinary Tract: No evidence of urolithiasis or hydronephrosis. No definite mass visualized on this un-enhanced exam. Stomach/Bowel: No evidence of obstruction or abnormal fluid collections. There is a smooth wall thickening of the cecum. There is a scatter left colonic diverticulosis without evidence of diverticulitis. Vascular/Lymphatic: No pathologically enlarged lymph nodes. No evidence of abdominal aortic aneurysm. Reproductive: Leiomyomatous uterus. Other: None. Musculoskeletal: No suspicious bone lesions identified. There is a stable compression deformity of L1 vertebral body, on the background of advanced multilevel osteoarthritic  changes of the lumbosacral spine. IMPRESSION: No evidence of obstructive uropathy. No evidence of acute abnormalities within the solid abdominal organs, given lack of IV contrast. A smooth wall thickening of the cecum, with infectious or inflammatory appearance. Scattered left colonic diverticulosis without evidence of diverticulitis. Leiomyomatous uterus. Electronically Signed   By: Fidela Salisbury M.D.   On: 12/12/2015 18:44   Dg Chest [facility]  12/12/2015  CLINICAL DATA:  Sepsis. EXAM: PORTABLE CHEST 1 VIEW COMPARISON:  11/09/2015.  08/10/2015.  02/12/2015. FINDINGS: Mediastinum and hilar structures normal. Stable changes of right middle lobe atelectasis and/or scarring. No pleural effusion or pneumothorax. No acute bony abnormality. IMPRESSION: 1. Stable changes of right middle lobe atelectasis and/or scarring . 2.  No acute cardiopulmonary disease. Electronically Signed   By: Marcello Moores  Register   On: 12/12/2015 17:13    EKG: Independently reviewed. Sinus tachycardia.  No acute ST segment changes.  Assessment/Plan Principal Problem:   Sepsis ([place]) Active Problems:   Chronic lymphocytic leukemia (H[facility])   Thrombocytopenia (HCC)   Chronic respiratory failure (HCC)   UTI (urinary tract infection)     Sepsis secondary to UTI --Admitted to the stepdown unit for relative hypotension --IV rocephin --blood and urine cultures pending  CLL --Baseline WBC count around 45K per patient's son.  Follow trend.  May need hematology consult prior to discharge --Aggressive hydration  Thrombocytopenia --Chronic, monitor trend --Avoid anticoagulants  Chronic respiratory failure, on home oxygen --Stable  Subacute GI complaints with abnormal CT (I do not think cecum wall thickening is contributing to her acute presentation but I am concerned about occult malignancy due to history of weight loss and early satiety). --Discussed with son.  Family would not want invasive procedures, including  endoscopy. --Continue supportive care for now   DVT prophylaxis: SCDs Code Status: FULL Family Communication: Patient's son Maurene Capes at bedside at time of admission Disposition Plan: Home when ready Consults called: NONE Admission status: Inpatient, SDU   Eber Jones MD [facility]  If 7PM-7AM, please contact night-coverage [email or web address] Password [email or web address]  12/13/2015, 1:08 AM

## 2015-12-13 NOTE — Progress Notes (Signed)
PROGRESS NOTE    Stefanie Henry  [id number] DOB: 11/26/1929 DOA: 12/12/2015 PCP: Mackie Pai, PA-C   Brief Narrative:  Stefanie Henry is an 82 year old female with a past medical history of chronic lymphocytic leukemia, admitted to the medicine service on 12/13/2015, presented with complaints of fevers, chills, encephalopathy. Urinalysis showed the presence of many bacteria along with leukocytes. On admission she was found to have a temperature 102.5 with a heart rate of 124. Sepsis secondary to urinary tract infection was suspected. Previous urine cultures from 11/18/2015 grew Escherichia coli, resistant to ampicillin, ciprofloxacin, Levaquin, Bactrim. She was started on empiric IV antibiotic therapy with ceftriaxone.   Assessment & Plan:   Principal Problem:   Sepsis ([place]) Active Problems:   Chronic lymphocytic leukemia (HCC)   Thrombocytopenia (HCC)   Chronic respiratory failure (HCC)   UTI (urinary tract infection)   1.  Sepsis. -Present on admission, evidenced by a temperature 102.5, heart rate of 124, acute encephalopathy, likely secondary to urinary tract infection. -Previous urine cultures from May growing Escherichia coli. Susceptibility testing revealed organism sensitive to cephalosporins. -Plan to continue IV ceftriaxone, follow-up on urine cultures and blood cultures. -Provide supportive care in step down unit.  2.  Urinary tract infection. -She has a history of recurrent urinary tract infections, presenting with probable urosepsis. -Previous urine culture from 11/18/2015 growing Escherichia coli, organism susceptible to cephalosporins. -We'll continue treating with ceftriaxone until susceptibility testing is available.  3.  Bright red blood per rectum. -She reports having intermittent episodes of bright red blood per rectum. -Last GI note on [facility] was from 04/18/2015. She had an office visit with Dr. Fuller Plan at which time they discussed her medication. They discussed  the possibility of colonoscopy however at the time out that she would not be able to tolerate bowel prep and did not undergo procedure. -She presents with urosepsis now, will monitor closely, following hemoglobin.  -Discussed performing colonoscopy once clinically stable.  4.  Chronic lymphocytic leukemia. -She follows Dr.Ennever at the cancer center. -It appears that she received 1 cycle of treatments and has not received any further treatments in the last 4 years. -On his last note on 03/29/2015 Dr. [name] recommended continuing observation. -She initially presented with a white count of 95,000, trending down to 45,000 on a.m. lab work.   DVT prophylaxis: SCDs Code Status: Full code Family Communication:  Disposition Plan: Continue close monitoring in the step down unit  Consultants:     Procedures:     Antimicrobials:   Ceftriaxone started on 12/13/2015   Subjective: She reports feeling a little better this morning, is awake and alert  Objective: Filed Vitals:   12/13/15 0500 12/13/15 0600 12/13/15 0700 12/13/15 0730  BP: 100/42 83/58 95/60    Pulse: 75 78 88   Temp:    98.7 F (37.1 C)  TempSrc:    Oral  Resp: 19 18 18    Height:      Weight:      SpO2: 100% 100% 100%     Intake/Output Summary (Last 24 hours) at 12/13/15 0831 Last data filed at 12/13/15 0730  Gross per 24 hour  Intake 1762.5 ml  Output    170 ml  Net 1592.5 ml   Filed Weights   12/12/15 1557  Weight: 52.617 kg (116 lb)    Examination:  General exam: Appears calm and comfortable, No acute distress. Respiratory system: Clear to auscultation. Respiratory effort normal. Cardiovascular system: S1 & S2 heard, RRR. No JVD, murmurs, rubs, gallops or  clicks. No pedal edema. Gastrointestinal system: She reports mild abdominal pain to palpation over the epigastric region Central nervous system: Alert and oriented. No focal neurological deficits. Extremities: Symmetric 5 x 5 power. Skin: No  rashes, lesions or ulcers Psychiatry: Judgement and insight appear normal. Mood & affect appropriate.     Data Reviewed: I have personally reviewed following labs and imaging studies  CBC:  Recent Labs Lab 12/12/15 1620 12/13/15 0054 12/13/15 0726  WBC 95.5* 54.1* 45.0*  NEUTROABS 14.3*  --   --   HGB 12.9 10.4* 9.5*  HCT 38.8 31.9* 29.3*  MCV 70.8* 70.9* 71.6*  PLT 126* 74* 71*   Basic Metabolic Panel:  Recent Labs Lab 12/12/15 1620 12/13/15 0054  NA 128* 132*  K 3.7 3.8  CL 95* 105  CO2 23 21*  GLUCOSE 143* 190*  BUN 15 11  CREATININE 0.95 0.50  CALCIUM 8.4* 7.4*   GFR: Estimated Creatinine Clearance: 35.4 mL/min (by C-G formula based on Cr of 0.5). Liver Function Tests:  Recent Labs Lab 12/12/15 1620  AST 20  ALT 15  ALKPHOS 52  BILITOT 1.8*  PROT 5.9*  ALBUMIN 3.8    Recent Labs Lab 12/12/15 1620  LIPASE 13   No results for input(s): AMMONIA in the last 168 hours. Coagulation Profile:  Recent Labs Lab 12/13/15 0054  INR 1.41   Cardiac Enzymes: No results for input(s): CKTOTAL, CKMB, CKMBINDEX, TROPONINI in the last 168 hours. BNP (last 3 results) No results for input(s): PROBNP in the last 8760 hours. HbA1C: No results for input(s): HGBA1C in the last 72 hours. CBG: No results for input(s): GLUCAP in the last 168 hours. Lipid Profile: No results for input(s): CHOL, HDL, LDLCALC, TRIG, CHOLHDL, LDLDIRECT in the last 72 hours. Thyroid Function Tests: No results for input(s): TSH, T4TOTAL, FREET4, T3FREE, THYROIDAB in the last 72 hours. Anemia Panel: No results for input(s): VITAMINB12, FOLATE, FERRITIN, TIBC, IRON, RETICCTPCT in the last 72 hours. Sepsis Labs:  Recent Labs Lab 12/12/15 1634 12/13/15 0054  PROCALCITON  --  0.56  LATICACIDVEN 1.23  --     Recent Results (from the past 240 hour(s))  Culture, blood (Routine x 2)     Status: None (Preliminary result)   Collection Time: 12/12/15  4:20 PM  Result Value Ref Range  Status   Specimen Description BLOOD RIGHT ANTECUBITAL  Final   Special Requests BOTTLES DRAWN AEROBIC AND ANAEROBIC [place]  Final   Culture PENDING  Incomplete   Report Status PENDING  Incomplete  Culture, blood (Routine x 2)     Status: None (Preliminary result)   Collection Time: 12/12/15  4:30 PM  Result Value Ref Range Status   Specimen Description BLOOD LEFT HAND  Final   Special Requests BOTTLES DRAWN AEROBIC AND ANAEROBIC 5CC  Final   Culture PENDING  Incomplete   Report Status PENDING  Incomplete  MRSA PCR Screening     Status: None   Collection Time: 12/13/15  1:01 AM  Result Value Ref Range Status   MRSA by PCR NEGATIVE NEGATIVE Final    Comment:        The GeneXpert MRSA Assay (FDA approved for NASAL specimens only), is one component of a comprehensive MRSA colonization surveillance program. It is not intended to diagnose MRSA infection nor to guide or monitor treatment for MRSA infections.          Radiology Studies: Ct Abdomen Pelvis Wo Contrast  12/12/2015  CLINICAL DATA:  Fatigue, confusion abdominal pain.  History of UTI and diverticulitis. EXAM: CT ABDOMEN AND PELVIS WITHOUT CONTRAST TECHNIQUE: Multidetector CT imaging of the abdomen and pelvis was performed following the standard protocol without IV contrast. COMPARISON:  11/09/2015 FINDINGS: Lower chest: No acute findings. Linear opacity in the left lower lobe likely represent scarring or atelectasis. Hepatobiliary: No mass visualized on this un-enhanced exam. Pancreas: No mass or inflammatory process identified on this un-enhanced exam. Spleen: Within normal limits in size. Adrenals/Urinary Tract: No evidence of urolithiasis or hydronephrosis. No definite mass visualized on this un-enhanced exam. Stomach/Bowel: No evidence of obstruction or abnormal fluid collections. There is a smooth wall thickening of the cecum. There is a scatter left colonic diverticulosis without evidence of diverticulitis. Vascular/Lymphatic:  No pathologically enlarged lymph nodes. No evidence of abdominal aortic aneurysm. Reproductive: Leiomyomatous uterus. Other: None. Musculoskeletal: No suspicious bone lesions identified. There is a stable compression deformity of L1 vertebral body, on the background of advanced multilevel osteoarthritic changes of the lumbosacral spine. IMPRESSION: No evidence of obstructive uropathy. No evidence of acute abnormalities within the solid abdominal organs, given lack of IV contrast. A smooth wall thickening of the cecum, with infectious or inflammatory appearance. Scattered left colonic diverticulosis without evidence of diverticulitis. Leiomyomatous uterus. Electronically Signed   By: Fidela Salisbury M.D.   On: 12/12/2015 18:44   Dg Chest Port 1 View  12/12/2015  CLINICAL DATA:  Sepsis. EXAM: PORTABLE CHEST 1 VIEW COMPARISON:  11/09/2015.  08/10/2015.  02/12/2015. FINDINGS: Mediastinum and hilar structures normal. Stable changes of right middle lobe atelectasis and/or scarring. No pleural effusion or pneumothorax. No acute bony abnormality. IMPRESSION: 1. Stable changes of right middle lobe atelectasis and/or scarring . 2.  No acute cardiopulmonary disease. Electronically Signed   By: Marcello Moores  Register   On: 12/12/2015 17:13        Scheduled Meds: . antiseptic oral rinse  7 mL Mouth Rinse BID  . calcitonin ([place])  1 spray Alternating Nares Daily  . cefTRIAXone (ROCEPHIN)  IV  1 g Intravenous Q24H  . mometasone-formoterol  2 puff Inhalation BID  . pantoprazole  40 mg Oral Daily  . saccharomyces boulardii  250 mg Oral BID  . simethicone  160 mg Oral Daily  . sodium chloride flush  3 mL Intravenous Q12H  . sucralfate  2 g Oral BID   Continuous Infusions: . sodium chloride 125 mL/hr at 12/12/15 2340     LOS: 1 day    Time spent:     Kelvin Cellar, MD [facility] Pager [phone]  If 7PM-7AM, please contact night-coverage [email or web address] Password [email or web address] 12/13/2015, 8:31 AM

## 2015-12-13 NOTE — Progress Notes (Signed)
Initial Nutrition Assessment  DOCUMENTATION CODES:   Non-severe (moderate) malnutrition in context of acute illness/injury  INTERVENTION:  - Will order Ensure Enlive once/day, this supplement provides 350 kcal and 20 grams of protein - Encourage PO intakes of meals and supplement. - RD will continue to monitor for additional needs and need for adjustment to supplements.  NUTRITION DIAGNOSIS:   Unintentional weight loss related to acute illness as evidenced by percent weight loss.  GOAL:   Patient will meet greater than or equal to 90% of their needs  MONITOR:   PO intake, Supplement acceptance, Weight trends, Labs, I & O's  REASON FOR ASSESSMENT:   Malnutrition Screening Tool  ASSESSMENT:   82 y.o. woman with a history of COPD (she is on home oxygen), CLL (baseline WBC count 45,000 per her son; she does not receive active treatment by hematology), and recurrent diverticulitis who presented to the ED in [facility] with her son for evaluation of fever, mental status changes, and decreased appetite. The patient was diagnosed with acute diverticulitis and MDR E Coli UTI in May. According to her son, she has difficulty tolerating PO antibiotics due to chronic GI issues (early satiety, gas, abdominal pain, nausea), so she took flagyl, then levaquin, and was just starting a course of Augmentin in the past two days. She developed rash, itching, and lip swelling concerning for reaction to the Augment, and she presented to her PCP, who ultimately referred her to the ED in [facility]  Pt seen for MST. BMI indicates overweight status. No intakes documented since admission. Pt denies having anything to eat this afternoon and she was NPO for breakfast. No family/visitors present and unable to locate phone number for family in the chart. Pt unable to provide much information due to limited English. She does indicate that abdominal pain is slight but improved.   No muscle or fat wasting present  at this time. Per chart review, pt has lost 9 lbs (7.2% body weight) in the past 1 month which is significant for time frame. Notes indicate poor appetite and intakes PTA. Pt meets criteria for malnutrition based on weight loss and decreased intakes.   Will order Ensure Enlive once/day and monitor for need for adjustment. Medications reviewed; 2 g Carafate BID. IVF: NS @ 125 mL/hr. Labs reviewed; Na: 132 mmol/L, Ca: 7.4 mg/dL.   Diet Order:  Diet regular Room service appropriate?: Yes; Fluid consistency:: Thin  Skin:  Reviewed, no issues  Last BM:  6/16  Height:   Ht Readings from Last 1 Encounters:  12/12/15 4\' 8"  (1.422 m)    Weight:   Wt Readings from Last 1 Encounters:  12/12/15 116 lb (52.617 kg)    Ideal Body Weight:  40.73 kg (kg)  BMI:  Body mass index is 26.02 kg/(m^2).  Estimated Nutritional Needs:   Kcal:  1050-1250  Protein:  50-60 grams  Fluid:  >/= 1.3 L/day  EDUCATION NEEDS:   No education needs identified at this time     Jarome Matin, MS, RD, LDN Inpatient Clinical Dietitian Pager # [phone] After hours/weekend pager # [phone]

## 2015-12-13 NOTE — Progress Notes (Signed)
Patient had an episode of bright red bloody stool; stool was mixed with urine so unsure of amount but there were some visible blood clots; On-call NP notified. Awaiting further orders. Will continue to monitor.

## 2015-12-14 ENCOUNTER — Inpatient Hospital Stay (HOSPITAL_COMMUNITY): Payer: Medicare Other

## 2015-12-14 LAB — BASIC METABOLIC PANEL
Anion gap: 6 (ref 5–15)
BUN: 7 mg/dL (ref 6–20)
CHLORIDE: 110 mmol/L (ref 101–111)
CO2: 19 mmol/L — ABNORMAL LOW (ref 22–32)
Calcium: 7 mg/dL — ABNORMAL LOW (ref 8.9–10.3)
Creatinine, Ser: 0.49 mg/dL (ref 0.44–1.00)
GFR calc non Af Amer: >60 mL/min (ref >60)
Glucose, Bld: 119 mg/dL — ABNORMAL HIGH (ref 65–99)
POTASSIUM: 3.3 mmol/L — AB (ref 3.5–5.1)
SODIUM: 135 mmol/L (ref 135–145)

## 2015-12-14 LAB — CBC
HEMATOCRIT: 27.4 % — AB (ref 36.0–46.0)
Hemoglobin: 9 g/dL — ABNORMAL LOW (ref 12.0–15.0)
MCH: 23.8 pg — ABNORMAL LOW (ref 26.0–34.0)
MCHC: 32.8 g/dL (ref 30.0–36.0)
MCV: 72.5 fL — AB (ref 78.0–100.0)
Platelets: 71 10*3/uL — ABNORMAL LOW (ref 150–400)
RBC: 3.78 MIL/uL — AB (ref 3.87–5.11)
RDW: 14.7 % (ref 11.5–15.5)
WBC: 29.1 10*3/uL — AB (ref 4.0–10.5)

## 2015-12-14 LAB — IGG, IGA, IGM
IGA: 18 mg/dL — AB (ref 64–422)
IGG (IMMUNOGLOBIN G), SERUM: 136 mg/dL — AB (ref 700–1600)
IgM, Serum: <5 mg/dL — ABNORMAL LOW (ref 26–217)

## 2015-12-14 MED ORDER — POTASSIUM CHLORIDE CRYS ER 20 MEQ PO TBCR
40.0000 meq | EXTENDED_RELEASE_TABLET | Freq: Once | ORAL | Status: AC
  Administered 2015-12-14: 40 meq via ORAL
  Filled 2015-12-14: qty 2

## 2015-12-14 MED ORDER — IPRATROPIUM-ALBUTEROL 0.5-2.5 (3) MG/3ML IN SOLN
3.0000 mL | Freq: Two times a day (BID) | RESPIRATORY_TRACT | Status: DC
  Administered 2015-12-15: 3 mL via RESPIRATORY_TRACT
  Filled 2015-12-14 (×2): qty 3

## 2015-12-14 NOTE — Progress Notes (Signed)
Further discussion with son, Maurene Capes, reveals that pt has a history of generalized redness and some swelling with all antibiotics x flagyl and levaquin.  No history of anaphilaxis.  States that benedryl makes her hemorrhoids bleed more.  In the past she has been given steroids with antibiotics and that has helped the redness.  He also reports that if she has IVF's running with antibiotics as a primary that this redness does not occur.  No antibiotics to be given tonight, K. Schoor has been notified of redness and I will report to tomorrow day nurse this information.

## 2015-12-14 NOTE — Evaluation (Signed)
Physical Therapy Evaluation Patient Details Name: Stefanie Henry MRN: [id number] DOB: 11/26/1929 Today's Date: 12/14/2015   History of Present Illness  82 yo female adm with sepsis; PMHx: L1 comp fx, asthma, COPD  Clinical Impression  Pt admitted with above diagnosis. Pt currently with functional limitations due to the deficits listed below (see PT Problem List).  Pt will benefit from skilled PT to increase their independence and safety with mobility to allow discharge to the venue listed below.   Pt able to amb ~40' in room with quad cane and min assist for balance, do not feel she will need post acute f/u at this point although no family present to fully determine baseline; will follow     Follow Up Recommendations No PT follow up    Equipment Recommendations  None recommended by PT    Recommendations for Other Services       Precautions / Restrictions Precautions Precautions: Fall Restrictions Weight Bearing Restrictions: No      Mobility  Bed Mobility Overal bed mobility: Needs Assistance Bed Mobility: Supine to Sit     Supine to sit: Supervision;HOB elevated     General bed mobility comments: HOB 30*, supervision for safety  Transfers Overall transfer level: Needs assistance Equipment used: 1 person hand held assist Transfers: Sit to/from Stand Sit to Stand: Min assist;Min guard         General transfer comment: bed ht very high for pt short stature; assist for balance and safety   Ambulation/Gait Ambulation/Gait assistance: Min assist Ambulation Distance (Feet): 40 Feet (in room) Assistive device: Quad cane Gait Pattern/deviations: Step-through pattern;Decreased stride length     General Gait Details: cues for safety in small/tighter space in room, min to min/guard during turns  Financial trader Rankin (Stroke Patients Only)       Balance Overall balance assessment: Needs assistance   Sitting  balance-Leahy Scale: Fair (at least)       Standing balance-Leahy Scale: Fair                               Pertinent Vitals/Pain Pain Assessment: No/denies pain    Home Living Family/patient expects to be discharged to:: Private residence Living Arrangements: Children Available Help at Discharge: Family           Home Equipment: Kasandra Knudsen - quad      Prior Function Level of Independence: Independent with assistive device(s)         Comments: amb with cane at baseline     Hand Dominance        Extremity/Trunk Assessment   Upper Extremity Assessment: Defer to OT evaluation           Lower Extremity Assessment: Overall WFL for tasks assessed         Communication   Communication: Prefers language other than English (Hindi)  Cognition Arousal/Alertness: Awake/alert Behavior During Therapy: WFL for tasks assessed/performed Overall Cognitive Status: Difficult to assess                      General Comments      Exercises        Assessment/Plan    PT Assessment Patient needs continued PT services  PT Diagnosis Difficulty walking   PT Problem List Decreased activity tolerance;Decreased balance;Decreased mobility  PT Treatment Interventions DME instruction;Gait training;Functional mobility training;Therapeutic  activities;Therapeutic exercise;Patient/family education;Balance training   PT Goals (Current goals can be found in the Care Plan section) Acute Rehab PT Goals Patient Stated Goal: none stated PT Goal Formulation: With patient Time For Goal Achievement: 12/28/15 Potential to Achieve Goals: Good    Frequency Min 3X/week   Barriers to discharge        Co-evaluation               End of Session Equipment Utilized During Treatment: Gait belt Activity Tolerance: Patient tolerated treatment well Patient left: in chair;with call bell/phone within reach;with chair alarm set           Time: [id number] PT Time  Calculation (min) (ACUTE ONLY): 13 min   Charges:   PT Evaluation $PT Eval Low Complexity: 1 Procedure     PT G Codes:        Geovannie Vilar 12/20/2015, 12:15 PM

## 2015-12-14 NOTE — Progress Notes (Signed)
PROGRESS NOTE    Stefanie Henry  [id number] DOB: Jun 27, 1930 DOA: 12/12/2015 PCP: Mackie Pai, PA-C   Brief Narrative:  Stefanie Henry is an 82 year old female with a past medical history of chronic lymphocytic leukemia, admitted to the medicine service on 12/13/2015, presented with complaints of fevers, chills, encephalopathy. Urinalysis showed the presence of many bacteria along with leukocytes. On admission she was found to have a temperature 102.5 with a heart rate of 124. Sepsis secondary to urinary tract infection was suspected. Previous urine cultures from 11/18/2015 grew Escherichia coli, resistant to ampicillin, ciprofloxacin, Levaquin, Bactrim. She was started on empiric IV antibiotic therapy with ceftriaxone.   Assessment & Plan:   Principal Problem:   Sepsis ([place]) Active Problems:   Chronic lymphocytic leukemia (HCC)   Thrombocytopenia (HCC)   Chronic respiratory failure (HCC)   UTI (urinary tract infection)   1.  Sepsis. -Present on admission, evidenced by a temperature 102.5, heart rate of 124, acute encephalopathy, likely secondary to urinary tract infection. -Previous urine cultures from May growing Escherichia coli. Susceptibility testing revealed organism sensitive to cephalosporins. -Plan to continue IV ceftriaxone, follow-up on urine cultures and blood cultures. -She has been afebrile since admission  2.  Urinary tract infection. -She has a history of recurrent urinary tract infections, presenting with probable urosepsis. -Previous urine culture from 11/18/2015 growing Escherichia coli, organism susceptible to cephalosporins. -Plan to continue continue treating with ceftriaxone until susceptibility testing is available.  3.  Bright red blood per rectum. -She reports having intermittent episodes of bright red blood per rectum. -Last GI note on EPIC was from 04/18/2015. She had an office visit with Dr. Fuller Plan at which time they discussed her medication. They  discussed the possibility of colonoscopy however at the time out that she would not be able to tolerate bowel prep and did not undergo procedure. -She presents with urosepsis now, will monitor closely, following hemoglobin.  -She continues to have intermittent episodes of bright red blood per rectum mixed with stool. RN reporting there is bright red blood on toilet paper as well. I suspect this represents a hemorrhoidal bleed. -On 12/14/2015 hemoglobin 9.0, trending down from 12.9 on 12/12/2015. I wonder if part of this may be related to the dilutional effect of IV fluid resuscitation she got for management of sepsis -Plan to continue monitoring CBC  4.  Chronic lymphocytic leukemia. -She follows Dr.Ennever at the cancer center. -It appears that she received 1 cycle of treatments and has not received any further treatments in the last 4 years. -On his last note on 03/29/2015 Dr. [name] recommended continuing observation. -She initially presented with a white count of 95,000, trending down to 29,100 on a.m. lab work.  5. Suspected fluid overload. -She reports having shortness of breath overnight associated with chest tightness. On physical exam exam she appears to be mildly edematous. She is satting mid 90s on room air currently. -Will check a chest x-ray along with EKG. -Hold off on IV Lasix for now having blood pressure 90/54.  6.  Hypokalemia -Lab work showing potassium of 3.3, will replace with oral potassium   DVT prophylaxis: SCDs Code Status: Full code Family Communication: I spoke to her son at bedside Disposition Plan: Continue close monitoring in the step down unit  Consultants:     Procedures:    Antimicrobials:   Ceftriaxone started on 12/13/2015   Subjective: She complains of developing shortness of breath overnight associated with chest tightness. She states not sleeping well.  Objective: Filed Vitals:  12/14/15 0000 12/14/15 0056 12/14/15 0400 12/14/15 0651    BP:  105/43  90/54  Pulse: 79 85 72 80  Temp: 97.9 F (36.6 C)  98.3 F (36.8 C)   TempSrc: Oral  Oral   Resp: 25 20 16 19   Height:      Weight:      SpO2: 100% 100% 96% 99%    Intake/Output Summary (Last 24 hours) at 12/14/15 0832 Last data filed at 12/14/15 0600  Gross per 24 hour  Intake   3210 ml  Output    250 ml  Net   2960 ml   Filed Weights   12/12/15 1557  Weight: 52.617 kg (116 lb)    Examination:  General exam: Appears calm and comfortable, No acute distress. Respiratory system: Clear to auscultation. Respiratory effort normal. Cardiovascular system: S1 & S2 heard, RRR.Interim development of bibasilar crackles more pronounced on right.. There is mild extremity edema Gastrointestinal system: She reports mild abdominal pain to palpation over the epigastric region Central nervous system: Alert and oriented. No focal neurological deficits. Extremities: Symmetric 5 x 5 power. Skin: No rashes, lesions or ulcers Psychiatry: Judgement and insight appear normal. Mood & affect appropriate.     Data Reviewed: I have personally reviewed following labs and imaging studies  CBC:  Recent Labs Lab 12/12/15 1620 12/13/15 0054 12/13/15 0726 12/14/15 0315  WBC 95.5* 54.1* 45.0* 29.1*  NEUTROABS 14.3*  --   --   --   HGB 12.9 10.4* 9.5* 9.0*  HCT 38.8 31.9* 29.3* 27.4*  MCV 70.8* 70.9* 71.6* 72.5*  PLT 126* 74* 71* 71*   Basic Metabolic Panel:  Recent Labs Lab 12/12/15 1620 12/13/15 0054 12/14/15 0315  NA 128* 132* 135  K 3.7 3.8 3.3*  CL 95* 105 110  CO2 23 21* 19*  GLUCOSE 143* 190* 119*  BUN 15 11 7   CREATININE 0.95 0.50 0.49  CALCIUM 8.4* 7.4* 7.0*   GFR: Estimated Creatinine Clearance: 35.4 mL/min (by C-G formula based on Cr of 0.49). Liver Function Tests:  Recent Labs Lab 12/12/15 1620  AST 20  ALT 15  ALKPHOS 52  BILITOT 1.8*  PROT 5.9*  ALBUMIN 3.8    Recent Labs Lab 12/12/15 1620  LIPASE 13   No results for input(s): AMMONIA  in the last 168 hours. Coagulation Profile:  Recent Labs Lab 12/13/15 0054  INR 1.41   Cardiac Enzymes: No results for input(s): CKTOTAL, CKMB, CKMBINDEX, TROPONINI in the last 168 hours. BNP (last 3 results) No results for input(s): PROBNP in the last 8760 hours. HbA1C: No results for input(s): HGBA1C in the last 72 hours. CBG: No results for input(s): GLUCAP in the last 168 hours. Lipid Profile: No results for input(s): CHOL, HDL, LDLCALC, TRIG, CHOLHDL, LDLDIRECT in the last 72 hours. Thyroid Function Tests: No results for input(s): TSH, T4TOTAL, FREET4, T3FREE, THYROIDAB in the last 72 hours. Anemia Panel: No results for input(s): VITAMINB12, FOLATE, FERRITIN, TIBC, IRON, RETICCTPCT in the last 72 hours. Sepsis Labs:  Recent Labs Lab 12/12/15 1634 12/13/15 0054  PROCALCITON  --  0.56  LATICACIDVEN 1.23  --     Recent Results (from the past 240 hour(s))  Urine culture     Status: None   Collection Time: 12/12/15  4:14 PM  Result Value Ref Range Status   Specimen Description URINE, CLEAN CATCH  Final   Special Requests NONE  Final   Culture NO GROWTH Performed at [facility]   Final  Report Status 12/13/2015 FINAL  Final  Culture, blood (Routine x 2)     Status: None (Preliminary result)   Collection Time: 12/12/15  4:20 PM  Result Value Ref Range Status   Specimen Description BLOOD RIGHT ANTECUBITAL  Final   Special Requests BOTTLES DRAWN AEROBIC AND ANAEROBIC [place]  Final   Culture   Final    NO GROWTH < 24 HOURS Performed at [facility]    Report Status PENDING  Incomplete  Culture, blood (Routine x 2)     Status: None (Preliminary result)   Collection Time: 12/12/15  4:30 PM  Result Value Ref Range Status   Specimen Description BLOOD LEFT HAND  Final   Special Requests BOTTLES DRAWN AEROBIC AND ANAEROBIC 5CC  Final   Culture   Final    NO GROWTH < 24 HOURS Performed at [facility]    Report Status PENDING  Incomplete  MRSA PCR  Screening     Status: None   Collection Time: 12/13/15  1:01 AM  Result Value Ref Range Status   MRSA by PCR NEGATIVE NEGATIVE Final    Comment:        The GeneXpert MRSA Assay (FDA approved for NASAL specimens only), is one component of a comprehensive MRSA colonization surveillance program. It is not intended to diagnose MRSA infection nor to guide or monitor treatment for MRSA infections.          Radiology Studies: Ct Abdomen Pelvis Wo Contrast  12/12/2015  CLINICAL DATA:  Fatigue, confusion abdominal pain. History of UTI and diverticulitis. EXAM: CT ABDOMEN AND PELVIS WITHOUT CONTRAST TECHNIQUE: Multidetector CT imaging of the abdomen and pelvis was performed following the standard protocol without IV contrast. COMPARISON:  11/09/2015 FINDINGS: Lower chest: No acute findings. Linear opacity in the left lower lobe likely represent scarring or atelectasis. Hepatobiliary: No mass visualized on this un-enhanced exam. Pancreas: No mass or inflammatory process identified on this un-enhanced exam. Spleen: Within normal limits in size. Adrenals/Urinary Tract: No evidence of urolithiasis or hydronephrosis. No definite mass visualized on this un-enhanced exam. Stomach/Bowel: No evidence of obstruction or abnormal fluid collections. There is a smooth wall thickening of the cecum. There is a scatter left colonic diverticulosis without evidence of diverticulitis. Vascular/Lymphatic: No pathologically enlarged lymph nodes. No evidence of abdominal aortic aneurysm. Reproductive: Leiomyomatous uterus. Other: None. Musculoskeletal: No suspicious bone lesions identified. There is a stable compression deformity of L1 vertebral body, on the background of advanced multilevel osteoarthritic changes of the lumbosacral spine. IMPRESSION: No evidence of obstructive uropathy. No evidence of acute abnormalities within the solid abdominal organs, given lack of IV contrast. A smooth wall thickening of the cecum, with  infectious or inflammatory appearance. Scattered left colonic diverticulosis without evidence of diverticulitis. Leiomyomatous uterus. Electronically Signed   By: Fidela Salisbury M.D.   On: 12/12/2015 18:44   Dg Chest Port 1 View  12/12/2015  CLINICAL DATA:  Sepsis. EXAM: PORTABLE CHEST 1 VIEW COMPARISON:  11/09/2015.  08/10/2015.  02/12/2015. FINDINGS: Mediastinum and hilar structures normal. Stable changes of right middle lobe atelectasis and/or scarring. No pleural effusion or pneumothorax. No acute bony abnormality. IMPRESSION: 1. Stable changes of right middle lobe atelectasis and/or scarring . 2.  No acute cardiopulmonary disease. Electronically Signed   By: Marcello Moores  Register   On: 12/12/2015 17:13        Scheduled Meds: . antiseptic oral rinse  7 mL Mouth Rinse BID  . calcitonin (salmon)  1 spray Alternating Nares Daily  .  cefTRIAXone (ROCEPHIN)  IV  1 g Intravenous Q24H  . feeding supplement (ENSURE ENLIVE)  237 mL Oral Q24H  . ipratropium-albuterol  3 mL Nebulization TID  . mometasone-formoterol  2 puff Inhalation BID  . pantoprazole  40 mg Oral Daily  . potassium chloride  40 mEq Oral Once  . saccharomyces boulardii  250 mg Oral BID  . simethicone  160 mg Oral Daily  . sodium chloride flush  3 mL Intravenous Q12H  . sucralfate  2 g Oral BID   Continuous Infusions:     LOS: 2 days    Time spent:     Kelvin Cellar, MD [facility] Pager [phone]  If 7PM-7AM, please contact night-coverage [email or web address] Password [facility] 12/14/2015, 8:32 AM

## 2015-12-14 NOTE — Progress Notes (Addendum)
Pt skin noted to be red during shift change report from day RN reported that pt brought from icu at 1800 and that icu rn reported that MD aware.  Pt is on rochephin.  Denies itching, vss.  Next dose is not until tomorrow.  Nothing in PN noted about redness of skin.  Son is concerned.  I have notified K.  Schoor on call.  Will continue to monitor.

## 2015-12-14 NOTE — Progress Notes (Signed)
Patient transferred from ICU. In no acute distress, denies pain, agree with previous shift assessment. Cardiac monitoring continued.  Barbee Shropshire. Brigitte Pulse, RN

## 2015-12-15 DIAGNOSIS — C9110 Chronic lymphocytic leukemia of B-cell type not having achieved remission: Secondary | ICD-10-CM

## 2015-12-15 DIAGNOSIS — J9611 Chronic respiratory failure with hypoxia: Secondary | ICD-10-CM

## 2015-12-15 DIAGNOSIS — N390 Urinary tract infection, site not specified: Secondary | ICD-10-CM

## 2015-12-15 DIAGNOSIS — I959 Hypotension, unspecified: Secondary | ICD-10-CM | POA: Diagnosis present

## 2015-12-15 DIAGNOSIS — J189 Pneumonia, unspecified organism: Secondary | ICD-10-CM

## 2015-12-15 DIAGNOSIS — D696 Thrombocytopenia, unspecified: Secondary | ICD-10-CM

## 2015-12-15 MED ORDER — POLYETHYLENE GLYCOL 3350 17 G PO PACK
17.0000 g | PACK | Freq: Every day | ORAL | Status: DC
Start: 2015-12-15 — End: 2015-12-15

## 2015-12-15 MED ORDER — LEVOFLOXACIN 750 MG PO TABS: 750.0000 mg | ORAL_TABLET | Freq: Every day | ORAL | Status: DC

## 2015-12-15 MED ORDER — LEVOFLOXACIN 750 MG PO TABS
750.0000 mg | ORAL_TABLET | ORAL | Status: DC
  Administered 2015-12-15: 750 mg via ORAL
  Filled 2015-12-15: qty 1

## 2015-12-15 MED ORDER — ENSURE ENLIVE PO LIQD: 237.0000 mL | ORAL | Status: DC

## 2015-12-15 MED ORDER — PSYLLIUM 58.6 % PO PACK: 1.0000 | PACK | Freq: Every day | ORAL | Status: AC

## 2015-12-15 MED ORDER — HYDROCORTISONE ACETATE 25 MG RE SUPP: 25.0000 mg | Freq: Two times a day (BID) | RECTAL | Status: DC

## 2015-12-15 MED ORDER — ONDANSETRON HCL 4 MG PO TABS: 4.0000 mg | ORAL_TABLET | Freq: Three times a day (TID) | ORAL | Status: DC | PRN

## 2015-12-15 NOTE — Discharge Summary (Signed)
[facility] Discharge Summary   Patient: Stefanie Henry [id number]   PCP: Mackie Pai, PA-C DOB: 01/26/1930   Date of admission: 12/12/2015   Date of discharge: 12/15/2015    Discharge Diagnoses:  Principal Problem:   Hypotension Active Problems:   Chronic lymphocytic leukemia ([place])   Thrombocytopenia ([place])   Chronic respiratory failure ([facility])   UTI (urinary tract infection)   HCAP (healthcare-associated pneumonia)  Admitted From: Home Disposition:  Home  Recommendations for Outpatient Follow-up:  1. Please follow-up with PCP in one week with CBC. 2. If continues to have hemorrhoidal bleeding please get referral to general surgery from PCP  3. Please follow-up with gastroenterology for colonoscopy due to cecal thickening as well as early satiety.  Follow-up Information    Follow up with Saguier, Percell Miller, PA-C. Schedule an appointment as soon as possible for a visit in 1 week.   Specialties:  Internal Medicine, Family Medicine   Why:  for CBC and hemorrhoids follow up and possible referral   Contact information:   [address] [place] [address] [phone]       Follow up with Norberto Sorenson T. Fuller Plan, MD In 3 weeks.   Specialty:  Gastroenterology   Why:  for colonoscopy and early satiety    Contact information:   [address] [address] [phone]      Diet recommendation: Regular diet  Activity: The patient is advised to gradually reintroduce usual activities.  Discharge Condition: good  Code Status: Full code  History of present illness: As per the H and P dictated on admission, "Sheva Oder is a 82 y.o. woman with a history of COPD (she is on home oxygen), CLL (baseline WBC count 45,000 per her son; she does not receive active treatment by hematology), and recurrent diverticulitis who presented to the ED in [facility] with her son for evaluation of fever, mental status changes, and decreased appetite. The patient was  diagnosed with acute diverticulitis and MDR E Coli UTI in May. According to her son, she has difficulty tolerating PO antibiotics due to chronic GI issues (early satiety, gas, abdominal pain, nausea), so she took flagyl, then levaquin, and was just starting a course of Augmentin in the past two days. She developed rash, itching, and lip swelling concerning for reaction to the Augment, and she presented to her PCP, who ultimately referred her to the ED in [facility] Course:  Summary of her active problems in the hospital is as following.  Principal Problem: Patchy pneumonia. Suspected healthcare associated.  Pt presented with fever of 103, normal lactic acid, hypotension. Sepsis less likely. Admitted in the step down unit and started on IV Rocephin. Blood cultures no growth for 3 days. Urine culture no growth. Blood pressure significantly improved. With IV Rocephin the patient has developed some rash which resolved at the time of my evaluation. Patient had a chest x-ray which showed patchy pneumonia. With his antibiotics changed to oral Levaquin due to rash and since its frequency is every 48 hours for patient's renal function patient would only need one dose of antibiotic. Patient completed a total 5 day treatment course.  Active Problems:   Chronic lymphocytic leukemia (HCC)   Thrombocytopenia (HCC) Follow-up with PCP.    Chronic anemia.   Hemorrhoidal bleeding. Patient mentions about minimal bleeding when she was wiping. Bleeding was described as bright red blood. Patient did not have any melena 90 nausea vomiting or abdominal pain. This is consistent  with patient's history of hemorrhoids. Recommend patient to take fibers and diet and follow-up with PCP in one week with CBC. If the CBC continues to trend down or if the patient has persistent bleeding recommend patient to establish care with general surgery for further workup of the hemorrhoids and possible treatment.    Early satiety.  Cecal thickening. Etiology of patient's early satiety is unclear. Recommend patient to follow-up with gastroenterology in outpatient setting. Also patient has cecal thickening and unable to complete colonoscopy in the past. Recommend patient to follow-up with gastrology for the same.    Chronic respiratory failure (HCC) Continue home oxygen.    UTI (urinary tract infection)  Urine culture now growing any organism. Patient complaint a 80 day course of ceftriaxone in the hospital.  All other chronic medical condition were stable during the hospitalization.  Patient was seen by physical therapy, who recommended no home therapy. On the day of the discharge the patient's vitals are stable, and no other acute medical condition were reported by patient. the patient was felt safe to be discharge at home with family.  Procedures and Results:  None   Consultations:  None  DISCHARGE MEDICATION: Discharge Medication List as of 12/15/2015 12:06 PM    START taking these medications   Details  feeding supplement, ENSURE ENLIVE, (ENSURE ENLIVE) LIQD Take 237 mLs by mouth daily., Starting 12/15/2015, Until Discontinued, Normal    hydrocortisone (ANUSOL-HC) 25 MG suppository Place 1 suppository (25 mg total) rectally 2 (two) times daily., Starting 12/15/2015, Until Discontinued, Normal    ondansetron (ZOFRAN) 4 MG tablet Take 1 tablet (4 mg total) by mouth every 8 (eight) hours as needed for nausea or vomiting., Starting 12/15/2015, Until Discontinued, Normal    psyllium (METAMUCIL) 58.6 % packet Take 1 packet by mouth daily., Starting 12/15/2015, Until Discontinued, Normal      CONTINUE these medications which have NOT CHANGED   Details  albuterol (PROVENTIL) (2.5 MG/3ML) 0.083% nebulizer solution Take 3 mLs (2.5 mg total) by nebulization 2 (two) times daily., Starting 12/02/2015, Until Discontinued, Normal    albuterol (VENTOLIN HFA) 108 (90 Base) MCG/ACT inhaler INHALE 1-2 PUFFS  INTO THE LUNGS EVERY 6 HOURS AS NEEDED FOR WHEEZING OR SHORTNESS OF BREATH., Normal    calcitonin, salmon, (MIACALCIN/FORTICAL) 200 UNIT/ACT nasal spray Place 1 spray into alternate nostrils daily., Starting 11/18/2015, Until Discontinued, Normal    gabapentin (NEURONTIN) 100 MG capsule TAKE 2 CAPSULES BY MOUTH 3 TIMES A DAY AS NEEDED FOR NERVE PAIN, Normal    hyoscyamine (LEVSIN SL) 0.125 MG SL tablet PLACE 1 TABLET (0.125 MG TOTAL) UNDER THE TONGUE EVERY 8 (EIGHT) HOURS AS NEEDED., Normal    mometasone-formoterol (DULERA) 200-5 MCG/ACT AERO Inhale 2 puffs into the lungs 2 (two) times daily., Starting 12/02/2015, Until Discontinued, Normal    NEOMYCIN-POLYMYXIN-HYDROCORTISONE (CORTISPORIN) 1 % SOLN otic solution PLACE 3 DROPS INTO THE LEFT EAR EVERY 8 (EIGHT) HOURS., Normal    omeprazole (PRILOSEC) 20 MG capsule Take 1 capsule (20 mg total) by mouth daily., Starting 11/18/2015, Until Discontinued, Normal    predniSONE (DELTASONE) 10 MG tablet TAKE 1 TABLET BY MOUTH EVERY DAY, Normal    saccharomyces boulardii (FLORASTOR) 250 MG capsule Take 1 capsule (250 mg total) by mouth 2 (two) times daily., Starting 11/13/2014, Until Discontinued, Print    Simethicone (GAS-X PO) Take 2 capsules by mouth daily. , Until Discontinued, Historical Med    sucralfate (CARAFATE) 1 G tablet TAKE 2 TABLETS IN THE MORNING AND AT BEDTIME, Normal  STOP taking these medications     amoxicillin-clavulanate (AUGMENTIN) 875-125 MG tablet      senna (SENOKOT) 8.6 MG TABS tablet      levofloxacin (LEVAQUIN) 750 MG tablet        Allergies  Allergen Reactions  . Contrast Media [Iodinated Diagnostic Agents] Other (See Comments)    Stomach discomfort  . Fruit & Vegetable Daily [Nutritional Supplements] Other (See Comments)    Citrus fruit causes stomach discomfort  . Ibuprofen Other (See Comments)    Stomach discomfort  . Other     Cannot take any antibiotics, causes rash   Discharge Instructions    Diet -  low sodium heart healthy    Complete by:  As directed      Discharge instructions    Complete by:  As directed   It is important that you read following instructions as well as go over your medication list with RN to help you understand your care after this hospitalization.  Discharge Instructions: Please follow-up with PCP in one week  Please request your primary care physician to go over all Hospital Tests and Procedure/Radiological results at the follow up,  Please get all Hospital records sent to your PCP by signing hospital release before you go home.   You were cared for by a hospitalist during your hospital stay. If you have any questions about your discharge medications or the care you received while you were in the hospital after you are discharged, you can call the unit and ask to speak with the hospitalist on call if the hospitalist that took care of you is not available.  Once you are discharged, your primary care physician will handle any further medical issues. Please note that NO REFILLS for any discharge medications will be authorized once you are discharged, as it is imperative that you return to your primary care physician (or establish a relationship with a primary care physician if you do not have one) for your aftercare needs so that they can reassess your need for medications and monitor your lab values. You Must read complete instructions/literature along with all the possible adverse reactions/side effects for all the Medicines you take and that have been prescribed to you. Take any new Medicines after you have completely understood and accept all the possible adverse reactions/side effects. Wear Seat belts while driving.     Increase activity slowly    Complete by:  As directed           Discharge Exam: Filed Weights   12/12/15 1557  Weight: 52.617 kg (116 lb)   Filed Vitals:   12/14/15 1839 12/14/15 2318  BP: 149/62 112/56  Pulse: 98 97  Temp: 98.5 F (36.9  C) 98.6 F (37 C)  Resp: 18 18   General: Appear in no distress, no Rash; Oral Mucosa moist. Cardiovascular: S1 and S2 Present, no Murmur, no JVD Respiratory: Bilateral Air entry present and Clear to Auscultation, no Crackles, no wheezes Abdomen: Bowel Sound present, Soft and no tenderness Extremities: no Pedal edema, no calf tenderness Neurology: Grossly no focal neuro deficit.  The results of significant diagnostics from this hospitalization (including imaging, microbiology, ancillary and laboratory) are listed below for reference.    Significant Diagnostic Studies: Ct Abdomen Pelvis Wo Contrast  12/12/2015  CLINICAL DATA:  Fatigue, confusion abdominal pain. History of UTI and diverticulitis. EXAM: CT ABDOMEN AND PELVIS WITHOUT CONTRAST TECHNIQUE: Multidetector CT imaging of the abdomen and pelvis was performed following the standard protocol without IV  contrast. COMPARISON:  11/09/2015 FINDINGS: Lower chest: No acute findings. Linear opacity in the left lower lobe likely represent scarring or atelectasis. Hepatobiliary: No mass visualized on this un-enhanced exam. Pancreas: No mass or inflammatory process identified on this un-enhanced exam. Spleen: Within normal limits in size. Adrenals/Urinary Tract: No evidence of urolithiasis or hydronephrosis. No definite mass visualized on this un-enhanced exam. Stomach/Bowel: No evidence of obstruction or abnormal fluid collections. There is a smooth wall thickening of the cecum. There is a scatter left colonic diverticulosis without evidence of diverticulitis. Vascular/Lymphatic: No pathologically enlarged lymph nodes. No evidence of abdominal aortic aneurysm. Reproductive: Leiomyomatous uterus. Other: None. Musculoskeletal: No suspicious bone lesions identified. There is a stable compression deformity of L1 vertebral body, on the background of advanced multilevel osteoarthritic changes of the lumbosacral spine. IMPRESSION: No evidence of obstructive  uropathy. No evidence of acute abnormalities within the solid abdominal organs, given lack of IV contrast. A smooth wall thickening of the cecum, with infectious or inflammatory appearance. Scattered left colonic diverticulosis without evidence of diverticulitis. Leiomyomatous uterus. Electronically Signed   By: Fidela Salisbury M.D.   On: 12/12/2015 18:44   Dg Chest 2 View  12/14/2015  CLINICAL DATA:  Shortness of breath EXAM: CHEST  2 VIEW COMPARISON:  12/12/2015 chest radiograph. FINDINGS: Stable cardiomediastinal silhouette with top-normal heart size. No pneumothorax. Trace bilateral pleural effusions. No overt pulmonary edema. Mildly hyperinflated lungs. Patchy right anterior lung base opacity appears increased. IMPRESSION: 1. Patchy right anterior lung base opacity, increased, which could represent atelectasis, pneumonia and/or aspiration. 2. Mildly hyperinflated lungs, suggesting COPD. 3. Trace bilateral pleural effusions. Electronically Signed   By: Ilona Sorrel M.D.   On: 12/14/2015 11:03   US Venous Img Lower Unilateral Left  11/20/2015  CLINICAL DATA:  Left calf pain for 1 week, initial encounter EXAM: Left LOWER EXTREMITY VENOUS DOPPLER ULTRASOUND TECHNIQUE: Gray-scale sonography with graded compression, as well as color Doppler and duplex ultrasound were performed to evaluate the lower extremity deep venous systems from the level of the common femoral vein and including the common femoral, femoral, profunda femoral, popliteal and calf veins including the posterior tibial, peroneal and gastrocnemius veins when visible. The superficial great saphenous vein was also interrogated. Spectral Doppler was utilized to evaluate flow at rest and with distal augmentation maneuvers in the common femoral, femoral and popliteal veins. COMPARISON:  None. FINDINGS: Contralateral Common Femoral Vein: Respiratory phasicity is normal and symmetric with the symptomatic side. No evidence of thrombus. Normal  compressibility. Common Femoral Vein: No evidence of thrombus. Normal compressibility, respiratory phasicity and response to augmentation. Saphenofemoral Junction: No evidence of thrombus. Normal compressibility and flow on color Doppler imaging. Profunda Femoral Vein: No evidence of thrombus. Normal compressibility and flow on color Doppler imaging. Femoral Vein: No evidence of thrombus. Normal compressibility, respiratory phasicity and response to augmentation. Popliteal Vein: No evidence of thrombus. Normal compressibility, respiratory phasicity and response to augmentation. Calf Veins: No evidence of thrombus. Normal compressibility and flow on color Doppler imaging. Superficial Great Saphenous Vein: No evidence of thrombus. Normal compressibility and flow on color Doppler imaging. Venous Reflux:  None. Other Findings:  None. IMPRESSION: No evidence of deep venous thrombosis. Electronically Signed   By: Inez Catalina M.D.   On: 11/20/2015 10:25   Dg Chest Port 1 View  12/12/2015  CLINICAL DATA:  Sepsis. EXAM: PORTABLE CHEST 1 VIEW COMPARISON:  11/09/2015.  08/10/2015.  02/12/2015. FINDINGS: Mediastinum and hilar structures normal. Stable changes of right middle lobe atelectasis and/or scarring.  No pleural effusion or pneumothorax. No acute bony abnormality. IMPRESSION: 1. Stable changes of right middle lobe atelectasis and/or scarring . 2.  No acute cardiopulmonary disease. Electronically Signed   By: Marcello Moores  Register   On: 12/12/2015 17:13    Microbiology: Recent Results (from the past 240 hour(s))  Urine culture     Status: None   Collection Time: 12/12/15  4:14 PM  Result Value Ref Range Status   Specimen Description URINE, CLEAN CATCH  Final   Special Requests NONE  Final   Culture NO GROWTH Performed at [facility]   Final   Report Status 12/13/2015 FINAL  Final  Culture, blood (Routine x 2)     Status: None (Preliminary result)   Collection Time: 12/12/15  4:20 PM  Result Value  Ref Range Status   Specimen Description BLOOD RIGHT ANTECUBITAL  Final   Special Requests BOTTLES DRAWN AEROBIC AND ANAEROBIC [place]  Final   Culture   Final    NO GROWTH 3 DAYS Performed at [facility]    Report Status PENDING  Incomplete  Culture, blood (Routine x 2)     Status: None (Preliminary result)   Collection Time: 12/12/15  4:30 PM  Result Value Ref Range Status   Specimen Description BLOOD LEFT HAND  Final   Special Requests BOTTLES DRAWN AEROBIC AND ANAEROBIC 5CC  Final   Culture   Final    NO GROWTH 3 DAYS Performed at [facility]    Report Status PENDING  Incomplete  MRSA PCR Screening     Status: None   Collection Time: 12/13/15  1:01 AM  Result Value Ref Range Status   MRSA by PCR NEGATIVE NEGATIVE Final    Comment:        The GeneXpert MRSA Assay (FDA approved for NASAL specimens only), is one component of a comprehensive MRSA colonization surveillance program. It is not intended to diagnose MRSA infection nor to guide or monitor treatment for MRSA infections.      Labs: CBC:  Recent Labs Lab 12/12/15 1620 12/13/15 0054 12/13/15 0726 12/14/15 0315  WBC 95.5* 54.1* 45.0* 29.1*  NEUTROABS 14.3*  --   --   --   HGB 12.9 10.4* 9.5* 9.0*  HCT 38.8 31.9* 29.3* 27.4*  MCV 70.8* 70.9* 71.6* 72.5*  PLT 126* 74* 71* 71*   Basic Metabolic Panel:  Recent Labs Lab 12/12/15 1620 12/13/15 0054 12/14/15 0315  NA 128* 132* 135  K 3.7 3.8 3.3*  CL 95* 105 110  CO2 23 21* 19*  GLUCOSE 143* 190* 119*  BUN 15 11 7   CREATININE 0.95 0.50 0.49  CALCIUM 8.4* 7.4* 7.0*   Liver Function Tests:  Recent Labs Lab 12/12/15 1620  AST 20  ALT 15  ALKPHOS 52  BILITOT 1.8*  PROT 5.9*  ALBUMIN 3.8    Recent Labs Lab 12/12/15 1620  LIPASE 13   No results for input(s): AMMONIA in the last 168 hours. Cardiac Enzymes: No results for input(s): CKTOTAL, CKMB, CKMBINDEX, TROPONINI in the last 168 hours. BNP (last 3 results)  Recent Labs   02/12/15 1140  BNP 117.2*   CBG: No results for input(s): GLUCAP in the last 168 hours. Time spent: 30 minutes  Signed:  Berle Mull  [facility] 12/15/2015 , 8:50 PM

## 2015-12-15 NOTE — Progress Notes (Signed)
SATURATION QUALIFICATIONS: (This note is used to comply with regulatory documentation for home oxygen)  Patient Saturations on Room Air at Rest = 98%  Patient Saturations on Room Air while Ambulating = 96%  Patient Saturations on  Liters of oxygen while Ambulating = %  Please briefly explain why patient needs home oxygen:

## 2015-12-15 NOTE — Progress Notes (Signed)
Reviewed discharge information with patient and caregiver. Answered all questions. Patient/caregiver able to teach back medications and reasons to contact MD/911. Caregiver verbalizes understanding that patient has completed antibiotic course. Patient verbalizes importance of PCP follow up appointment.  Barbee Shropshire. Brigitte Pulse, RN

## 2015-12-16 ENCOUNTER — Telehealth: Payer: Self-pay | Admitting: Behavioral Health

## 2015-12-16 NOTE — Telephone Encounter (Signed)
Attempted to reach patient for TCM/Hospital Follow-up. Left message for patient to return call when available.    

## 2015-12-17 LAB — CULTURE, BLOOD (ROUTINE X 2)
CULTURE: NO GROWTH
Culture: NO GROWTH

## 2015-12-17 NOTE — Telephone Encounter (Signed)
Left a message x 2 for TCM/Hospital Follow-up.

## 2015-12-18 ENCOUNTER — Encounter: Payer: Self-pay | Admitting: Family Medicine

## 2015-12-18 ENCOUNTER — Telehealth: Payer: Self-pay | Admitting: Medical

## 2015-12-18 ENCOUNTER — Ambulatory Visit (INDEPENDENT_AMBULATORY_CARE_PROVIDER_SITE_OTHER): Payer: Medicare Other | Admitting: Family Medicine

## 2015-12-18 ENCOUNTER — Telehealth: Payer: Self-pay | Admitting: Family Medicine

## 2015-12-18 VITALS — BP 122/73 | HR 98 | Temp 99.1°F | Ht <= 58 in | Wt 136.0 lb

## 2015-12-18 DIAGNOSIS — D509 Iron deficiency anemia, unspecified: Secondary | ICD-10-CM

## 2015-12-18 DIAGNOSIS — C9110 Chronic lymphocytic leukemia of B-cell type not having achieved remission: Secondary | ICD-10-CM

## 2015-12-18 DIAGNOSIS — I959 Hypotension, unspecified: Secondary | ICD-10-CM

## 2015-12-18 DIAGNOSIS — R609 Edema, unspecified: Secondary | ICD-10-CM | POA: Diagnosis not present

## 2015-12-18 DIAGNOSIS — J9611 Chronic respiratory failure with hypoxia: Secondary | ICD-10-CM

## 2015-12-18 DIAGNOSIS — K5732 Diverticulitis of large intestine without perforation or abscess without bleeding: Secondary | ICD-10-CM

## 2015-12-18 HISTORY — DX: Edema, unspecified: R60.9

## 2015-12-18 LAB — BASIC METABOLIC PANEL
BUN: 7 mg/dL (ref 6–23)
CALCIUM: 8.9 mg/dL (ref 8.4–10.5)
CO2: 30 meq/L (ref 19–32)
CREATININE: 0.55 mg/dL (ref 0.40–1.20)
Chloride: 102 mEq/L (ref 96–112)
GFR: 111.81 mL/min (ref >60.00)
Glucose, Bld: 151 mg/dL — ABNORMAL HIGH (ref 70–99)
Potassium: 3.6 mEq/L (ref 3.5–5.1)
SODIUM: 138 meq/L (ref 135–145)

## 2015-12-18 LAB — BRAIN NATRIURETIC PEPTIDE: PRO B NATRI PEPTIDE: 307 pg/mL — AB (ref 0.0–100.0)

## 2015-12-18 LAB — CBC WITH DIFFERENTIAL/PLATELET
BASOS ABS: 0.5 10*3/uL — AB (ref 0.0–0.1)
Basophils Relative: 0.8 % (ref 0.0–3.0)
EOS PCT: 0.1 % (ref 0.0–5.0)
Eosinophils Absolute: 0.1 10*3/uL (ref 0.0–0.7)
HCT: 33.7 % — ABNORMAL LOW (ref 36.0–46.0)
Hemoglobin: 10.5 g/dL — ABNORMAL LOW (ref 12.0–15.0)
LYMPHS ABS: 51.2 10*3/uL — AB (ref 0.7–4.0)
Lymphocytes Relative: 81.9 % — ABNORMAL HIGH (ref 12.0–46.0)
MCHC: 31.1 g/dL (ref 30.0–36.0)
MCV: 73.4 fl — ABNORMAL LOW (ref 78.0–100.0)
MONO ABS: 3 10*3/uL — AB (ref 0.1–1.0)
Monocytes Relative: 4.8 % (ref 3.0–12.0)
NEUTROS ABS: 7.8 10*3/uL — AB (ref 1.4–7.7)
NEUTROS PCT: 12.4 % — AB (ref 43.0–77.0)
PLATELETS: 145 10*3/uL — AB (ref 150.0–400.0)
RBC: 4.58 Mil/uL (ref 3.87–5.11)
RDW: 15 % (ref 11.5–15.5)
WBC: 62.5 10*3/uL (ref 4.0–10.5)

## 2015-12-18 MED ORDER — FUROSEMIDE 20 MG PO TABS: 20.0000 mg | ORAL_TABLET | Freq: Every day | ORAL | Status: DC

## 2015-12-18 NOTE — Telephone Encounter (Signed)
Critical lab result white blood cell count was 62.5. Dr. Sarajane Jews was notified of this and aware of result, no action was needed or taken at this time.

## 2015-12-18 NOTE — Telephone Encounter (Signed)
Patient's son (Amil) called to inform that patient is having an allergic reaction to a medication given to her in the hospital. States she has swelling in feet and arms x 2 days. Transferred to [facility].

## 2015-12-18 NOTE — Telephone Encounter (Signed)
Patient Name: Stefanie Henry  DOB: [birth year]    Initial Comment Caller states is having an allergic reaction to medication. Swelling to feet and arms. She was DX on Sunday with a UTI.   Nurse Assessment  Nurse: Mallie Mussel, RN, Alveta Heimlich Date/Time Eilene Ghazi Time): 12/18/2015 10:34:10 AM  Confirm and document reason for call. If symptomatic, describe symptoms. You must click the next button to save text entered. ---Caller states that his mother was diagnosed with UTI last week and put on Augmentin. She began to have swelling of her arms and feet. She was seen in ER on Thursday and admitted. She was discharged on Monday. They gave her different ABX via IV while in the hospital. She has new arm swelling and feet swelling which began Monday night. He thinks its another reaction to the ABX. Denies chest pain and difficulty breathing. Temp is 98.5 orally. The swelling in both legs go up past the knee. They are both swollen about the same. He states that his mother just told him that she sometimes have heaviness in her chest that comes and goes. She only has this heaviness whenever her swelling is like it is now.  Has the patient traveled out of the country within the last 30 days? ---No  Does the patient have any new or worsening symptoms? ---Yes  Will a triage be completed? ---Yes  Related visit to physician within the last 2 weeks? ---Yes  Does the PT have any chronic conditions? (i.e. diabetes, asthma, etc.) ---Yes  List chronic conditions. ---Asthma  Is this a behavioral health or substance abuse call? ---No     Guidelines    Guideline Title Affirmed Question Affirmed Notes  Leg Swelling and Edema SEVERE leg swelling (e.g., swelling extends above knee, entire leg is swollen, weeping fluid)    Final Disposition User   See Physician within 4 Hours (or PCP triage) Mallie Mussel, RN, Alveta Heimlich    Comments  No appointments available at [facility] I checked for the NP Tommi Rumps and Almyra Free who are both off today. I was able to  schedule an appointment today with Dr. Alysia Penna at 2:00pm today.   Referrals  REFERRED TO PCP OFFICE   Disagree/Comply: Comply

## 2015-12-18 NOTE — Telephone Encounter (Signed)
Noted  

## 2015-12-18 NOTE — Progress Notes (Signed)
   Subjective:    Patient ID: Stefanie Henry, female    DOB: 1929-09-19, 82 y.o.   MRN: [id number]  HPI Here with her son and his wife for swelling in both legs and feet. No chest pain or unusual SOB. She was hospitalized from 12-12-15 to 12-15-15 for hypotension due to a combination of diverticulitis and an E coli UTI. Sepsis was ruled out. All cultures remained negative. She had been treated for a week prior to her admission with Flagyl, then Levaquin, and then Augmentin, but she did not improve and she developed swelling in the legs. The family is convinced that the swelling is an allergic reaction to some or all of the antibiotics. She was given her last dose of oral Levaquin the day of her DC home and she has had none since. Since getting home she has felt better with more strength and a better appetite. No fevers. She has several loose stools every day but no signs of blood. She urinates normally. She has COPD and she uses [facility] oxygen at home on an as needed basis. Her last CBC showed a decline of the HGb from 11 to 9. Her BUN/creatinine has remained normal throughout the week. She weighs about 6 lbs more than before she went into the hospital.    Review of Systems  Constitutional: Positive for fatigue. Negative for fever, chills and diaphoresis.  Respiratory: Positive for shortness of breath. Negative for cough, chest tightness and wheezing.   Cardiovascular: Positive for leg swelling. Negative for chest pain and palpitations.  Gastrointestinal: Positive for diarrhea. Negative for nausea, vomiting, abdominal pain, constipation, blood in stool and abdominal distention.  Genitourinary: Negative.   Neurological: Negative.        Objective:   Physical Exam  Constitutional: She is oriented to person, place, and time. No distress.  In a wheelchair, not using oxygen   Neck: No thyromegaly present.  Cardiovascular: Normal rate, regular rhythm, normal heart sounds and intact distal pulses.   No  murmur heard. Pulmonary/Chest: Effort normal. No respiratory distress. She has no wheezes. She has no rales.  Musculoskeletal:  3+ edema in both legs to mid thigh level   Lymphadenopathy:    She has no cervical adenopathy.  Neurological: She is alert and oriented to person, place, and time.          Assessment & Plan:  She seems to be recovering from a recent bout of diverticulitis and a UTI. We will check another CBC today. She has LE edema of uncertain etiology although I suppose it could be a reaction to antibiotics. Of course she received a huge fluid load with the IV medications she was getting in the hospital. No evidence of CHF on exam but we will check a BNP level today. No hx of renal disease but we will check another BMET today. Start on Lasix 20 mg daily. They will limit sodium intake in the diet. Recheck in 2 days. She is scheduled to fly to [facility] next Monday so we will need to re-evaluate her prior to that trip.

## 2015-12-18 NOTE — Progress Notes (Signed)
Pre visit review using our clinic review tool, if applicable. No additional management support is needed unless otherwise documented below in the visit note. 

## 2015-12-19 ENCOUNTER — Telehealth: Payer: Self-pay | Admitting: Medical

## 2015-12-19 NOTE — Telephone Encounter (Signed)
Pt will be traveling on Monday. Son says that pt would need documentation to show that she use inhaler and nebulizer.  Son says that they will be at the [place] office tomorrow seeing Dr. Sarajane Jews, he would like to know if we could fax it to that office OR either make letter available via My Chart so that he could print it out?

## 2015-12-20 ENCOUNTER — Encounter: Payer: Self-pay | Admitting: Family Medicine

## 2015-12-20 ENCOUNTER — Ambulatory Visit (INDEPENDENT_AMBULATORY_CARE_PROVIDER_SITE_OTHER): Payer: Medicare Other | Admitting: Family Medicine

## 2015-12-20 ENCOUNTER — Encounter: Payer: Self-pay | Admitting: Physician Assistant

## 2015-12-20 ENCOUNTER — Telehealth: Payer: Self-pay

## 2015-12-20 VITALS — BP 110/68 | HR 95 | Temp 98.5°F | Ht <= 58 in | Wt 131.0 lb

## 2015-12-20 DIAGNOSIS — C9110 Chronic lymphocytic leukemia of B-cell type not having achieved remission: Secondary | ICD-10-CM

## 2015-12-20 DIAGNOSIS — I509 Heart failure, unspecified: Secondary | ICD-10-CM

## 2015-12-20 DIAGNOSIS — J9611 Chronic respiratory failure with hypoxia: Secondary | ICD-10-CM | POA: Diagnosis not present

## 2015-12-20 HISTORY — DX: Heart failure, unspecified: I50.9

## 2015-12-20 NOTE — Telephone Encounter (Signed)
Called to speak with patients son regarding letter written by C. Hassell Done.

## 2015-12-20 NOTE — Telephone Encounter (Signed)
Spoke with patients son,regarding letter per Zane Herald

## 2015-12-20 NOTE — Progress Notes (Signed)
Pre visit review using our clinic review tool, if applicable. No additional management support is needed unless otherwise documented below in the visit note. 

## 2015-12-20 NOTE — Telephone Encounter (Signed)
I have written a letter stating that the patient is prescribed and uses both an albuterol inhaler and a nebulizer. They should be able to access it from the patient's MyChart and print. Also Dr. Barbie Banner office should be able to see it and print it if needed.

## 2015-12-20 NOTE — Telephone Encounter (Signed)
Spoke with son regarding letter written for patient.

## 2015-12-20 NOTE — Telephone Encounter (Signed)
Pt son called in to follow up on message sent . Advised to fwd to DOD to take care of.      [phone]

## 2015-12-20 NOTE — Progress Notes (Signed)
   Subjective:    Patient ID: Stefanie Henry, female    DOB: 06-Oct-1929, 82 y.o.   MRN: [id number]  HPI Here to follow up on leg swelling. She was here a few days ago and was started on Lasix 20 mg daily. This has caused her to urinate more than usual as expected and she has lost 5 lbs of fluid weight. Her feet and legs are less swollen and she is breathing moire easily. Her lab work showed normal renal function but an elevated BNP confirmed some mild CHF.    Review of Systems  Constitutional: Negative.   Respiratory: Negative.   Cardiovascular: Positive for leg swelling. Negative for chest pain and palpitations.  Neurological: Negative.        Objective:   Physical Exam  Constitutional: She appears well-developed and well-nourished. No distress.  Cardiovascular: Normal rate, regular rhythm, normal heart sounds and intact distal pulses.   Pulmonary/Chest: Effort normal and breath sounds normal. No respiratory distress. She has no wheezes. She has no rales.  Musculoskeletal:  Trace edema in both feet and ankles           Assessment & Plan:  Mild CHF which has been controlled with Lasix. She will stay on the current regimen. She will be flying with her family next Monday out of the country and will return home on 01-09-16. I asked her to follow up with her PCP upon her return.  Laurey Morale, MD

## 2015-12-24 ENCOUNTER — Telehealth: Payer: Self-pay | Admitting: *Deleted

## 2015-12-24 NOTE — Telephone Encounter (Signed)
Initiated PA for [facility] thru [facility]. Key: [id number]  Submitted for review.  [id number] [address], [place] - [place] [phone] (Phone) [phone] (Fax)

## 2015-12-25 NOTE — Telephone Encounter (Signed)
Stefanie Henry has been approved until 06/28/2016.  Ref # [id number].  Pharmacy informed.

## 2016-01-07 ENCOUNTER — Other Ambulatory Visit: Payer: Self-pay | Admitting: Hematology & Oncology

## 2016-01-13 ENCOUNTER — Other Ambulatory Visit: Payer: Self-pay

## 2016-01-13 MED ORDER — OMEPRAZOLE 20 MG PO CPDR: 20.0000 mg | DELAYED_RELEASE_CAPSULE | Freq: Every day | ORAL | Status: DC

## 2016-01-16 ENCOUNTER — Other Ambulatory Visit: Payer: Self-pay | Admitting: General Practice

## 2016-01-16 MED ORDER — FUROSEMIDE 20 MG PO TABS: 20.0000 mg | ORAL_TABLET | Freq: Every day | ORAL | Status: DC

## 2016-02-11 ENCOUNTER — Other Ambulatory Visit: Payer: Self-pay | Admitting: Hematology & Oncology

## 2016-02-11 DIAGNOSIS — R220 Localized swelling, mass and lump, head: Secondary | ICD-10-CM | POA: Diagnosis not present

## 2016-02-11 DIAGNOSIS — T2010XA Burn of first degree of head, face, and neck, unspecified site, initial encounter: Secondary | ICD-10-CM | POA: Diagnosis not present

## 2016-02-25 DIAGNOSIS — Z0000 Encounter for general adult medical examination without abnormal findings: Secondary | ICD-10-CM | POA: Diagnosis not present

## 2016-02-25 DIAGNOSIS — R0602 Shortness of breath: Secondary | ICD-10-CM | POA: Diagnosis not present

## 2016-02-25 DIAGNOSIS — R209 Unspecified disturbances of skin sensation: Secondary | ICD-10-CM | POA: Diagnosis not present

## 2016-02-25 DIAGNOSIS — Z79899 Other long term (current) drug therapy: Secondary | ICD-10-CM | POA: Diagnosis not present

## 2016-02-25 DIAGNOSIS — T2010XA Burn of first degree of head, face, and neck, unspecified site, initial encounter: Secondary | ICD-10-CM | POA: Diagnosis not present

## 2016-03-04 ENCOUNTER — Ambulatory Visit (HOSPITAL_BASED_OUTPATIENT_CLINIC_OR_DEPARTMENT_OTHER): Payer: Medicare Other | Admitting: Family

## 2016-03-04 ENCOUNTER — Other Ambulatory Visit (HOSPITAL_BASED_OUTPATIENT_CLINIC_OR_DEPARTMENT_OTHER): Payer: Medicare Other

## 2016-03-04 ENCOUNTER — Encounter: Payer: Self-pay | Admitting: Family

## 2016-03-04 VITALS — BP 112/55 | HR 81 | Temp 98.6°F | Resp 16 | Ht <= 58 in | Wt 132.0 lb

## 2016-03-04 DIAGNOSIS — C9110 Chronic lymphocytic leukemia of B-cell type not having achieved remission: Secondary | ICD-10-CM

## 2016-03-04 DIAGNOSIS — D509 Iron deficiency anemia, unspecified: Secondary | ICD-10-CM | POA: Diagnosis not present

## 2016-03-04 LAB — MANUAL DIFFERENTIAL (CHCC SATELLITE)
ALC: 54.1 10*3/uL — ABNORMAL HIGH (ref 0.6–2.2)
ANC (CHCC HP manual diff): 2.8 10*3/uL (ref 1.5–6.7)
BAND NEUTROPHILS: 0 % (ref 0–10)
BASO: 0 % (ref 0–2)
Blasts: 0 % (ref 0–0)
Eos: 0 % (ref 0–7)
LYMPH: 95 % — AB (ref 14–48)
MONO: 0 % (ref 0–13)
Metamyelocytes: 0 % (ref 0–0)
Myelocytes: 0 % (ref 0–0)
NRBC: 0 % (ref 0–0)
OTHER CELLS: 0 % (ref 0–0)
OTHER COMMENTS: 0
PLT EST ~~LOC~~: DECREASED
PROMYELO: 0 % (ref 0–0)
SEG: 5 % — AB (ref 40–75)
Variant Lymph: 0 % (ref 0–0)

## 2016-03-04 LAB — CBC WITH DIFFERENTIAL (CANCER CENTER ONLY)
HCT: 32.7 % — ABNORMAL LOW (ref 34.8–46.6)
HEMOGLOBIN: 10.5 g/dL — AB (ref 11.6–15.9)
MCH: 23.6 pg — ABNORMAL LOW (ref 26.0–34.0)
MCHC: 32.1 g/dL (ref 32.0–36.0)
MCV: 74 fL — ABNORMAL LOW (ref 81–101)
Platelets: 108 10*3/uL — ABNORMAL LOW (ref 145–400)
RBC: 4.45 10*6/uL (ref 3.70–5.32)
RDW: 13.9 % (ref 11.1–15.7)
WBC: 56.9 10*3/uL (ref 3.9–10.0)

## 2016-03-04 LAB — IRON AND TIBC
%SAT: 36 % (ref 21–57)
Iron: 97 ug/dL (ref 41–142)
TIBC: 268 ug/dL (ref 236–444)
UIBC: 171 ug/dL (ref 120–384)

## 2016-03-04 LAB — FERRITIN: FERRITIN: 289 ng/mL — AB (ref 9–269)

## 2016-03-04 LAB — CHCC SATELLITE - SMEAR
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
## 2016-03-05 ENCOUNTER — Telehealth: Payer: Self-pay | Admitting: Nurse Practitioner

## 2016-03-05 NOTE — Telephone Encounter (Addendum)
Spoke w/daughter who verbalized understanding. ----- Message from Eliezer Bottom, NP sent at 03/05/2016 11:59 AM EDT ----- Regarding: iron  Iron studies look good! No infusion needed. Thank you!  Sarah  ----- Message ----- From: Interface, Lab In Three Zero One Sent: 03/04/2016   9:04 AM To: Eliezer Bottom, NP

## 2016-03-13 ENCOUNTER — Other Ambulatory Visit: Payer: Self-pay | Admitting: Hematology & Oncology

## 2016-03-19 ENCOUNTER — Encounter: Payer: Self-pay | Admitting: Pulmonary Disease

## 2016-03-19 ENCOUNTER — Ambulatory Visit (INDEPENDENT_AMBULATORY_CARE_PROVIDER_SITE_OTHER): Payer: Medicare Other | Admitting: Pulmonary Disease

## 2016-03-19 DIAGNOSIS — J9611 Chronic respiratory failure with hypoxia: Secondary | ICD-10-CM

## 2016-03-19 DIAGNOSIS — Z23 Encounter for immunization: Secondary | ICD-10-CM | POA: Diagnosis not present

## 2016-03-19 DIAGNOSIS — J4540 Moderate persistent asthma, uncomplicated: Secondary | ICD-10-CM

## 2016-03-19 NOTE — Patient Instructions (Signed)
Flu shot Take Dulera daily Oxygen only as needed

## 2016-03-19 NOTE — Assessment & Plan Note (Signed)
Flu shot Take Dulera daily Albuterol nebs as needed

## 2016-03-19 NOTE — Assessment & Plan Note (Signed)
Oxygen only as needed
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
## 2016-04-14 ENCOUNTER — Other Ambulatory Visit: Payer: Self-pay | Admitting: Hematology & Oncology

## 2016-04-14 ENCOUNTER — Other Ambulatory Visit: Payer: Self-pay | Admitting: Medical

## 2016-04-28 ENCOUNTER — Ambulatory Visit (INDEPENDENT_AMBULATORY_CARE_PROVIDER_SITE_OTHER)
Admission: RE | Admit: 2016-04-28 | Discharge: 2016-04-28 | Disposition: A | Discharge status: Home / Self Care | Payer: Medicare Other | Source: Ambulatory Visit | Attending: Pulmonary Disease | Admitting: Pulmonary Disease

## 2016-04-28 ENCOUNTER — Other Ambulatory Visit (INDEPENDENT_AMBULATORY_CARE_PROVIDER_SITE_OTHER): Payer: Medicare Other

## 2016-04-28 ENCOUNTER — Encounter: Payer: Self-pay | Admitting: Pulmonary Disease

## 2016-04-28 ENCOUNTER — Ambulatory Visit (INDEPENDENT_AMBULATORY_CARE_PROVIDER_SITE_OTHER): Payer: Medicare Other | Admitting: Pulmonary Disease

## 2016-04-28 DIAGNOSIS — J4541 Moderate persistent asthma with (acute) exacerbation: Secondary | ICD-10-CM | POA: Diagnosis not present

## 2016-04-28 DIAGNOSIS — R0600 Dyspnea, unspecified: Secondary | ICD-10-CM | POA: Diagnosis not present

## 2016-04-28 DIAGNOSIS — R0602 Shortness of breath: Secondary | ICD-10-CM | POA: Diagnosis not present

## 2016-04-28 LAB — BRAIN NATRIURETIC PEPTIDE: Pro B Natriuretic peptide (BNP): 107 pg/mL — ABNORMAL HIGH (ref 0.0–100.0)

## 2016-04-28 MED ORDER — PREDNISONE 20 MG PO TABS: 20.0000 mg | ORAL_TABLET | Freq: Every day | ORAL | 0 refills | Status: AC

## 2016-04-28 NOTE — Progress Notes (Signed)
Subjective:    Patient ID: Stefanie Henry, female    DOB: [birth year], 82 y.o.   MRN: [id number]  Synopsis: Patient of Dr. Elsworth Soho who has asthma.  HPI Chief Complaint  Patient presents with  . Acute Visit    RA pt being treated for asthma presents with increased SOB, chest pressure, some sinus congestion X3 weeks.     Shajuana's son provides the history today due to language issues.  She has been noticing increasing dyspnea > over two weeks > it has been worsening > feels phlegm or "an obstruction" mid chest when she breaths > She still takes Dulera > she uses albuterol nebulizer once per day, this doesn't help > no sick contacts > no fever, no sinus symptoms > sometimes she has sinus congestion in the mornings  Cough: none at all  Leg swelling> rare, not too often; was worse a few days ago, not now  Chest pain> none   Past Medical History:  Diagnosis Date  . Anemia, iron deficiency 06/06/2012  . Arthritis   . Asthma   . Blood transfusion without reported diagnosis   . Cataract   . CHF (congestive heart failure) ([place]) 12/20/2015  . Chronic lymphatic leukemia ([place])   . COPD (chronic obstructive pulmonary disease) ([place])   . Diverticulitis   . Gallstones   . GERD (gastroesophageal reflux disease)   . Neuromuscular disorder ([place])   . Shingles       Review of Systems  Constitutional: Negative for chills, fatigue and fever.  HENT: Negative for postnasal drip, rhinorrhea and sinus pressure.   Respiratory: Positive for shortness of breath and wheezing. Negative for cough.   Cardiovascular: Positive for leg swelling. Negative for chest pain and palpitations.       Objective:   Physical Exam Vitals:   04/28/16 1450  BP: (!) 124/58  Pulse: 92  SpO2: 96%  Weight: 136 lb (61.7 kg)  Height: 4\' 8"  (1.422 m)   RA  Gen: well appearing HENT: OP clear,  neck supple PULM: Crackles bases of both lungs, slight end expiratory wheeze, normal percussion CV: RRR, no mgr, ankle  edema noted GI: BS+, soft, nontender Derm: no cyanosis or rash Psyche: normal mood and affect  Dr. Bari Mantis notes reviewed where she was cared for for asthma  CBC    Component Value Date/Time   WBC 56.9 (HH) 03/04/2016 0834   WBC 62.5 Repeated and verified X2. (HH) 12/18/2015 1446   RBC 4.45 03/04/2016 0834   RBC 4.58 12/18/2015 1446   HGB 10.5 (L) 03/04/2016 0834   HCT 32.7 (L) 03/04/2016 0834   PLT 108 (L) 03/04/2016 0834   MCV 74 (L) 03/04/2016 0834   MCH 23.6 (L) 03/04/2016 0834   MCH 23.8 (L) 12/14/2015 0315   MCHC 32.1 03/04/2016 0834   MCHC 31.1 12/18/2015 1446   RDW 13.9 03/04/2016 0834   LYMPHSABS 51.2 (H) 12/18/2015 1446   LYMPHSABS 6.0 (H) 01/25/2013 1144   MONOABS 3.0 (H) 12/18/2015 1446   EOSABS 0.1 12/18/2015 1446   EOSABS 0.1 01/25/2013 1144   BASOSABS 0.5 (H) 12/18/2015 1446   BASOSABS 0.0 01/25/2013 1144         Assessment & Plan:  Asthma, moderate persistent I believe that she has a mild asthma exacerbation based on wheezing chest tightness and increasing shortness of breath.  There is no signs or symptoms of an infection on today's history or exam.  Plan: Continue Dulera Prednisone 20 mg 5 days Use albuterol 3 times  a day for the next 3 days Call us if no improvement  Dyspnea She has significant dyspnea with an exam that is somewhat worrisome for volume overload (leg swelling, crackles in bases of lungs.  Though, I think this is most likely an asthma exacerbation need to evaluate her for CHF.  Plan: Check pro BNP Chest x-ray    Current Outpatient Prescriptions:  .  albuterol (PROVENTIL) (2.5 MG/3ML) 0.083% nebulizer solution, Take 3 mLs (2.5 mg total) by nebulization 2 (two) times daily., Disp: 180 mL, Rfl: 3 .  albuterol (VENTOLIN HFA) 108 (90 Base) MCG/ACT inhaler, INHALE 1-2 PUFFS INTO THE LUNGS EVERY 6 HOURS AS NEEDED FOR WHEEZING OR SHORTNESS OF BREATH., Disp: 18 Inhaler, Rfl: 0 .  calcitonin, salmon, (MIACALCIN/FORTICAL) 200  UNIT/ACT nasal spray, Place 1 spray into alternate nostrils daily., Disp: 3.7 mL, Rfl: 12 .  feeding supplement, ENSURE ENLIVE, (ENSURE ENLIVE) LIQD, Take 237 mLs by mouth daily., Disp: 237 mL, Rfl: 12 .  gabapentin (NEURONTIN) 100 MG capsule, TAKE 2 CAPSULES BY MOUTH 3 TIMES A DAY AS NEEDED FOR NERVE PAIN, Disp: 180 capsule, Rfl: 3 .  hydrocortisone (ANUSOL-HC) 25 MG suppository, Place 1 suppository (25 mg total) rectally 2 (two) times daily., Disp: 12 suppository, Rfl: 0 .  hyoscyamine (LEVSIN SL) 0.125 MG SL tablet, PLACE 1 TABLET (0.125 MG TOTAL) UNDER THE TONGUE EVERY 8 (EIGHT) HOURS AS NEEDED. (Patient taking differently: Take 0.125 mg by mouth every 8 (eight) hours as needed for cramping. Marland Kitchen), Disp: 60 tablet, Rfl: 2 .  mometasone-formoterol (DULERA) 200-5 MCG/ACT AERO, Inhale 2 puffs into the lungs 2 (two) times daily., Disp: 1 Inhaler, Rfl: 5 .  NEOMYCIN-POLYMYXIN-HYDROCORTISONE (CORTISPORIN) 1 % SOLN otic solution, PLACE 3 DROPS INTO THE LEFT EAR EVERY 8 (EIGHT) HOURS., Disp: 10 mL, Rfl: 0 .  omeprazole (PRILOSEC) 20 MG capsule, TAKE ONE CAPSULE BY MOUTH EVERY DAY, Disp: 90 capsule, Rfl: 0 .  predniSONE (DELTASONE) 10 MG tablet, TAKE 1 TABLET BY MOUTH EVERY DAY, Disp: 30 tablet, Rfl: 0 .  psyllium (METAMUCIL) 58.6 % packet, Take 1 packet by mouth daily., Disp: 30 each, Rfl: 0 .  saccharomyces boulardii (FLORASTOR) 250 MG capsule, Take 1 capsule (250 mg total) by mouth 2 (two) times daily., Disp: 60 capsule, Rfl: 1 .  silver sulfADIAZINE (SILVADENE) 1 % cream, , Disp: , Rfl:  .  Simethicone (GAS-X PO), Take 2 capsules by mouth daily. , Disp: , Rfl:  .  sucralfate (CARAFATE) 1 G tablet, TAKE 2 TABLETS IN THE MORNING AND AT BEDTIME, Disp: 120 tablet, Rfl: 11

## 2016-04-28 NOTE — Assessment & Plan Note (Signed)
She has significant dyspnea with an exam that is somewhat worrisome for volume overload (leg swelling, crackles in bases of lungs.  Though, I think this is most likely an asthma exacerbation need to evaluate her for CHF.  Plan: Check pro BNP Chest x-ray

## 2016-04-28 NOTE — Assessment & Plan Note (Signed)
I believe that she has a mild asthma exacerbation based on wheezing chest tightness and increasing shortness of breath.  There is no signs or symptoms of an infection on today's history or exam.  Plan: Continue Dulera Prednisone 20 mg 5 days Use albuterol 3 times a day for the next 3 days Call us if no improvement

## 2016-04-28 NOTE — Patient Instructions (Signed)
Take prednisone 20 mg daily then go back to her normal dose Use albuterol 3 times a day We will call you with the results of the blood test and chest x-ray let us no if your symptoms do not improve Follow-up with Dr. Elsworth Soho as previously arranged

## 2016-04-30 ENCOUNTER — Telehealth: Payer: Self-pay | Admitting: Pulmonary Disease

## 2016-04-30 NOTE — Telephone Encounter (Signed)
Spoke with pt's daughter Lucile Crater (dpr on file), aware of bloodwork and cxr results.  Nothing further needed.

## 2016-05-08 ENCOUNTER — Ambulatory Visit (HOSPITAL_BASED_OUTPATIENT_CLINIC_OR_DEPARTMENT_OTHER): Payer: Medicare Other | Admitting: Family

## 2016-05-08 ENCOUNTER — Other Ambulatory Visit (HOSPITAL_BASED_OUTPATIENT_CLINIC_OR_DEPARTMENT_OTHER): Payer: Medicare Other

## 2016-05-08 ENCOUNTER — Encounter: Payer: Self-pay | Admitting: Family

## 2016-05-08 VITALS — BP 102/55 | HR 98 | Temp 98.6°F | Resp 18 | Ht <= 58 in | Wt 131.0 lb

## 2016-05-08 DIAGNOSIS — D509 Iron deficiency anemia, unspecified: Secondary | ICD-10-CM | POA: Diagnosis present

## 2016-05-08 DIAGNOSIS — C9110 Chronic lymphocytic leukemia of B-cell type not having achieved remission: Secondary | ICD-10-CM

## 2016-05-08 DIAGNOSIS — R0602 Shortness of breath: Secondary | ICD-10-CM

## 2016-05-08 LAB — CBC WITH DIFFERENTIAL (CANCER CENTER ONLY)
HCT: 35.1 % (ref 34.8–46.6)
HGB: 11.2 g/dL — ABNORMAL LOW (ref 11.6–15.9)
MCH: 23.1 pg — AB (ref 26.0–34.0)
MCHC: 31.9 g/dL — AB (ref 32.0–36.0)
MCV: 73 fL — AB (ref 81–101)
PLATELETS: 134 10*3/uL — AB (ref 145–400)
RBC: 4.84 10*6/uL (ref 3.70–5.32)
RDW: 14.1 % (ref 11.1–15.7)
WBC: 83.5 10*3/uL (ref 3.9–10.0)

## 2016-05-08 LAB — MANUAL DIFFERENTIAL (CHCC SATELLITE)
ALC: 73.4 10*3/uL — ABNORMAL HIGH (ref 0.6–2.2)
ANC (CHCC HP manual diff): 8.3 10*3/uL — ABNORMAL HIGH (ref 1.5–6.7)
LYMPH: 88 % — ABNORMAL HIGH (ref 14–48)
PLT EST ~~LOC~~: DECREASED
SEG: 10 % — ABNORMAL LOW (ref 40–75)

## 2016-05-08 LAB — CHCC SATELLITE - SMEAR
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
## 2016-05-10 ENCOUNTER — Other Ambulatory Visit: Payer: Self-pay | Admitting: Gastroenterology

## 2016-05-11 ENCOUNTER — Other Ambulatory Visit: Payer: Self-pay | Admitting: Gastroenterology

## 2016-05-11 ENCOUNTER — Ambulatory Visit: Payer: Medicare Other | Admitting: Family

## 2016-05-11 ENCOUNTER — Encounter: Payer: Self-pay | Admitting: *Deleted

## 2016-05-11 ENCOUNTER — Other Ambulatory Visit: Payer: Medicare Other

## 2016-05-11 LAB — IRON AND TIBC
%SAT: 27 % (ref 21–57)
Iron: 80 ug/dL (ref 41–142)
TIBC: 296 ug/dL (ref 236–444)
UIBC: 217 ug/dL (ref 120–384)

## 2016-05-11 LAB — FERRITIN: Ferritin: 267 ng/ml (ref 9–269)

## 2016-06-01 ENCOUNTER — Ambulatory Visit: Payer: Medicare Other | Admitting: Family

## 2016-06-01 ENCOUNTER — Other Ambulatory Visit: Payer: Medicare Other

## 2016-06-04 ENCOUNTER — Encounter: Payer: Self-pay | Admitting: Adult Health

## 2016-06-04 ENCOUNTER — Other Ambulatory Visit (INDEPENDENT_AMBULATORY_CARE_PROVIDER_SITE_OTHER): Payer: Medicare Other

## 2016-06-04 ENCOUNTER — Other Ambulatory Visit: Payer: Medicare Other

## 2016-06-04 ENCOUNTER — Ambulatory Visit (INDEPENDENT_AMBULATORY_CARE_PROVIDER_SITE_OTHER): Payer: Medicare Other | Admitting: Adult Health

## 2016-06-04 VITALS — BP 103/67 | HR 88 | Ht 59.0 in | Wt 139.0 lb

## 2016-06-04 DIAGNOSIS — R0602 Shortness of breath: Secondary | ICD-10-CM | POA: Diagnosis not present

## 2016-06-04 DIAGNOSIS — J4541 Moderate persistent asthma with (acute) exacerbation: Secondary | ICD-10-CM | POA: Diagnosis not present

## 2016-06-04 LAB — BASIC METABOLIC PANEL
BUN: 9 mg/dL (ref 7–25)
CALCIUM: 8.5 mg/dL — AB (ref 8.6–10.4)
CO2: 27 mmol/L (ref 20–31)
CREATININE: 0.68 mg/dL (ref 0.60–0.88)
Chloride: 100 mmol/L (ref 98–110)
GLUCOSE: 93 mg/dL (ref 65–99)
POTASSIUM: 3.5 mmol/L (ref 3.5–5.3)
Sodium: 135 mmol/L (ref 135–146)

## 2016-06-04 NOTE — Progress Notes (Signed)
Subjective:    Patient ID: Stefanie Henry, female    DOB: June 17, 1930, 82 y.o.   MRN: [id number]  HPI 82 yo female never smoker with Asthma and Chronic Resp Failure on o2 with act and At bedtime   She has CLL followed by Dr. [name] on chronic steroids w/ prednisone 10mg  daily   TEST  Echo 09/2014-normal LV function, grade 1 diastolic dysfunction CT abdomen 04/2015 bases of lungs appear clear Chest x-ray 07/2015 suggests right middle lobe scarring Spirometry 04/2015-ratio 66, FEV1 44% and FVC 49%   06/04/2016 Acute OV  : Asthma (She is accompanied by her son who helps with interpretation) Patient presents for an acute office visit. . Complains of progressive dyspnea/DOE for last 3-4 months . Seen in Oct, treated with prednisone burst 20mg  daily for 1 week for possible asthma flare. CXR last ov showed no acute ov.   She feels fatigued and low energy . Appetite is down. Did not see any sigificant change in dyspnea/DOE after steroids. Remains on Dulera .   She is followed for CLL with Onoclogy. Most recent WBC is up to 85K . According to her son and Oncology notes she prefers a natural approach and declines chemo. She denies fever, discolored mucus or swollen glands.     Past Medical History:  Diagnosis Date  . Anemia, iron deficiency 06/06/2012  . Arthritis   . Asthma   . Blood transfusion without reported diagnosis   . Cataract   . CHF (congestive heart failure) ([place]) 12/20/2015  . Chronic lymphatic leukemia ([place])   . COPD (chronic obstructive pulmonary disease) ([place])   . Diverticulitis   . Gallstones   . GERD (gastroesophageal reflux disease)   . Neuromuscular disorder ([place])   . Shingles    Current Outpatient Prescriptions on File Prior to Visit  Medication Sig Dispense Refill  . albuterol (PROVENTIL) (2.5 MG/3ML) 0.083% nebulizer solution Take 3 mLs (2.5 mg total) by nebulization 2 (two) times daily. 180 mL 3  . albuterol (VENTOLIN HFA) 108 (90 Base) MCG/ACT inhaler INHALE 1-2  PUFFS INTO THE LUNGS EVERY 6 HOURS AS NEEDED FOR WHEEZING OR SHORTNESS OF BREATH. 18 Inhaler 0  . calcitonin, salmon, (MIACALCIN/FORTICAL) 200 UNIT/ACT nasal spray Place 1 spray into alternate nostrils daily. 3.7 mL 12  . feeding supplement, ENSURE ENLIVE, (ENSURE ENLIVE) LIQD Take 237 mLs by mouth daily. 237 mL 12  . gabapentin (NEURONTIN) 100 MG capsule TAKE 2 CAPSULES BY MOUTH 3 TIMES A DAY AS NEEDED FOR NERVE PAIN 180 capsule 3  . hydrocortisone (ANUSOL-HC) 25 MG suppository Place 1 suppository (25 mg total) rectally 2 (two) times daily. 12 suppository 0  . hyoscyamine (LEVSIN SL) 0.125 MG SL tablet PLACE 1 TABLET (0.125 MG TOTAL) UNDER THE TONGUE EVERY 8 (EIGHT) HOURS AS NEEDED. (Patient taking differently: Take 0.125 mg by mouth every 8 (eight) hours as needed for cramping. Marland Kitchen) 60 tablet 2  . mometasone-formoterol (DULERA) 200-5 MCG/ACT AERO Inhale 2 puffs into the lungs 2 (two) times daily. 1 Inhaler 5  . NEOMYCIN-POLYMYXIN-HYDROCORTISONE (CORTISPORIN) 1 % SOLN otic solution PLACE 3 DROPS INTO THE LEFT EAR EVERY 8 (EIGHT) HOURS. 10 mL 0  . omeprazole (PRILOSEC) 20 MG capsule TAKE ONE CAPSULE BY MOUTH EVERY DAY 90 capsule 0  . predniSONE (DELTASONE) 10 MG tablet TAKE 1 TABLET BY MOUTH EVERY DAY 30 tablet 0  . psyllium (METAMUCIL) 58.6 % packet Take 1 packet by mouth daily. 30 each 0  . Simethicone (GAS-X PO) Take 2 capsules by  mouth daily.     . sucralfate (CARAFATE) 1 g tablet TAKE 2 TABLETS IN THE MORNING AND AT BEDTIME 120 tablet 0   No current facility-administered medications on file prior to visit.      Review of Systems Constitutional:   No  weight loss, night sweats,  Fevers, chills, + fatigue, or  lassitude.  HEENT:   No headaches,  Difficulty swallowing,  Tooth/dental problems, or  Sore throat,                No sneezing, itching, ear ache, nasal congestion, post nasal drip,   CV:  No chest pain,  Orthopnea, PND, swelling in lower extremities, anasarca, dizziness, palpitations,  syncope.   GI  No heartburn, indigestion, abdominal pain, nausea, vomiting, diarrhea, change in bowel habits, loss of appetite, bloody stools.   Resp:    No chest wall deformity  Skin: no rash or lesions.  GU: no dysuria, change in color of urine, no urgency or frequency.  No flank pain, no hematuria   MS:  No joint pain or swelling.  No decreased range of motion.  No back pain.  Psych:  No change in mood or affect. No depression or anxiety.  No memory loss.         Objective:   Physical Exam  Vitals:   06/04/16 0955  BP: 103/67  Pulse: 88  SpO2: 97%  Weight: 139 lb (63 kg)  Height: 4\' 11"  (1.499 m)   GEN: A/Ox3; pleasant , NAD , elderly in wc    HEENT:  [place]/AT,  EACs-clear, TMs-wnl, NOSE-clear, THROAT-clear, no lesions, no postnasal drip or exudate noted.   NECK:  Supple w/ fair ROM; no JVD; normal carotid impulses w/o bruits; no thyromegaly or nodules palpated; no lymphadenopathy.    RESP  Clear  P & A; w/o, wheezes/ rales/ or rhonchi. no accessory muscle use, no dullness to percussion  CARD:  RRR, no m/r/g  , tr peripheral edema, pulses intact, no cyanosis or clubbing.  GI:   Soft & nt; nml bowel sounds; no organomegaly or masses detected.   Musco: Warm bil, no deformities or joint swelling noted.   Neuro: alert, no focal deficits noted.    Skin: Warm, no lesions or rashes  Stefanie Dominski NP-C  [facility]  06/04/2016      Assessment & Plan:

## 2016-06-04 NOTE — Patient Instructions (Signed)
Increase Prednisone 20mg  daily for 1 week then 10mg  daily .  Labs today  Follow up with Dr. Elsworth Soho  In 1 month and As needed   Please contact office for sooner follow up if symptoms do not improve or worsen or seek emergency care

## 2016-06-04 NOTE — Addendum Note (Signed)
Addended by: Peggyann Shoals on: 06/04/2016 11:19 AM   Modules accepted: Orders

## 2016-06-05 LAB — CBC WITH DIFFERENTIAL/PLATELET
BASOS PCT: 0 %
Basophils Absolute: 0 cells/uL (ref 0–200)
EOS PCT: 0 %
Eosinophils Absolute: 0 cells/uL — ABNORMAL LOW (ref 15–500)
HCT: 35.1 % (ref 35.0–45.0)
Hemoglobin: 10.8 g/dL — ABNORMAL LOW (ref 11.7–15.5)
LYMPHS PCT: 91 %
Lymphs Abs: 54236 cells/uL — ABNORMAL HIGH (ref 850–3900)
MCH: 22.6 pg — ABNORMAL LOW (ref 27.0–33.0)
MCHC: 30.8 g/dL — AB (ref 32.0–36.0)
MCV: 73.4 fL — ABNORMAL LOW (ref 80.0–100.0)
MONO ABS: 596 {cells}/uL (ref 200–950)
MONOS PCT: 1 %
MPV: 10.7 fL (ref 7.5–12.5)
NEUTROS PCT: 8 %
Neutro Abs: 4768 cells/uL (ref 1500–7800)
PLATELETS: 124 10*3/uL — AB (ref 140–400)
RBC: 4.78 MIL/uL (ref 3.80–5.10)
RDW: 15.4 % — AB (ref 11.0–15.0)
WBC: 59.6 10*3/uL — AB (ref 3.8–10.8)

## 2016-06-05 LAB — BRAIN NATRIURETIC PEPTIDE: Brain Natriuretic Peptide: 70.5 pg/mL (ref <100)

## 2016-06-05 NOTE — Assessment & Plan Note (Signed)
Progressive Dyspnea -suspect is multifactoral with worsening CLL -recent WBC count 85K .  Along with severe chronic obstructive asthma  Will try brief steroid bump to see if any benefit .  Recheck labs for possible change with cbc/BNP   Plan  . Patient Instructions  Increase Prednisone 20mg  daily for 1 week then 10mg  daily .  Labs today  Follow up with Dr. Elsworth Soho  In 1 month and As needed   Please contact office for sooner follow up if symptoms do not improve or worsen or seek emergency care

## 2016-06-05 NOTE — Assessment & Plan Note (Signed)
Possible flare although no increased cough or wheezing  Plan  Patient Instructions  Increase Prednisone 20mg  daily for 1 week then 10mg  daily .  Labs today  Follow up with Dr. Elsworth Soho  In 1 month and As needed   Please contact office for sooner follow up if symptoms do not improve or worsen or seek emergency care

## 2016-06-09 ENCOUNTER — Other Ambulatory Visit: Payer: Self-pay | Admitting: Gastroenterology

## 2016-06-10 ENCOUNTER — Telehealth: Payer: Self-pay | Admitting: Pulmonary Disease

## 2016-06-10 NOTE — Telephone Encounter (Signed)
LMOMTCB x 1 

## 2016-06-11 ENCOUNTER — Ambulatory Visit: Payer: Medicare Other | Admitting: Adult Health

## 2016-06-12 ENCOUNTER — Other Ambulatory Visit: Payer: Self-pay | Admitting: Hematology & Oncology

## 2016-06-12 MED ORDER — PREDNISONE 20 MG PO TABS: 20.0000 mg | ORAL_TABLET | Freq: Every day | ORAL | 0 refills | Status: DC

## 2016-06-12 NOTE — Telephone Encounter (Signed)
Called and spoke with pts family and they stated that the pt was seen by TP on 12/7 and that her prednisone was not sent in to the pharmacy.  This has been sent in for the 7 days that she would have needed to increase from pred 10 to pred 20 mg  Daily.  Nothing further is needed.

## 2016-06-14 ENCOUNTER — Other Ambulatory Visit: Payer: Self-pay | Admitting: Gastroenterology

## 2016-06-20 ENCOUNTER — Other Ambulatory Visit: Payer: Self-pay | Admitting: Gastroenterology

## 2016-06-23 ENCOUNTER — Telehealth: Payer: Self-pay | Admitting: *Deleted

## 2016-06-23 NOTE — Telephone Encounter (Signed)
I called [facility] and cancelled rx of carafate that was sent electronically today from electronic refill request, Patient has been discharged from our practice .Marland Kitchen

## 2016-06-30 ENCOUNTER — Other Ambulatory Visit: Payer: Self-pay | Admitting: Hematology & Oncology

## 2016-07-04 NOTE — Progress Notes (Signed)
Reviewed & agree with plan  

## 2016-07-09 DIAGNOSIS — M25552 Pain in left hip: Secondary | ICD-10-CM | POA: Diagnosis not present

## 2016-07-12 ENCOUNTER — Other Ambulatory Visit: Payer: Self-pay | Admitting: Medical

## 2016-07-12 ENCOUNTER — Other Ambulatory Visit: Payer: Self-pay | Admitting: Adult Health

## 2016-07-20 ENCOUNTER — Other Ambulatory Visit: Payer: Self-pay | Admitting: Medical

## 2016-07-29 ENCOUNTER — Encounter (HOSPITAL_BASED_OUTPATIENT_CLINIC_OR_DEPARTMENT_OTHER): Payer: Self-pay

## 2016-07-29 ENCOUNTER — Emergency Department (HOSPITAL_BASED_OUTPATIENT_CLINIC_OR_DEPARTMENT_OTHER)
Admission: EM | Admit: 2016-07-29 | Discharge: 2016-07-29 | Disposition: A | Discharge status: Home / Self Care | Payer: Medicare Other | Attending: Emergency Medicine | Admitting: Emergency Medicine

## 2016-07-29 ENCOUNTER — Emergency Department (HOSPITAL_BASED_OUTPATIENT_CLINIC_OR_DEPARTMENT_OTHER): Payer: Medicare Other

## 2016-07-29 DIAGNOSIS — K5732 Diverticulitis of large intestine without perforation or abscess without bleeding: Secondary | ICD-10-CM | POA: Diagnosis not present

## 2016-07-29 DIAGNOSIS — I509 Heart failure, unspecified: Secondary | ICD-10-CM | POA: Diagnosis not present

## 2016-07-29 DIAGNOSIS — Z79899 Other long term (current) drug therapy: Secondary | ICD-10-CM | POA: Insufficient documentation

## 2016-07-29 DIAGNOSIS — D72820 Lymphocytosis (symptomatic): Secondary | ICD-10-CM | POA: Diagnosis not present

## 2016-07-29 DIAGNOSIS — J45909 Unspecified asthma, uncomplicated: Secondary | ICD-10-CM | POA: Insufficient documentation

## 2016-07-29 DIAGNOSIS — K5730 Diverticulosis of large intestine without perforation or abscess without bleeding: Secondary | ICD-10-CM | POA: Diagnosis not present

## 2016-07-29 DIAGNOSIS — J449 Chronic obstructive pulmonary disease, unspecified: Secondary | ICD-10-CM | POA: Diagnosis not present

## 2016-07-29 DIAGNOSIS — R1031 Right lower quadrant pain: Secondary | ICD-10-CM | POA: Diagnosis present

## 2016-07-29 DIAGNOSIS — N3000 Acute cystitis without hematuria: Secondary | ICD-10-CM | POA: Diagnosis not present

## 2016-07-29 LAB — CBC WITH DIFFERENTIAL/PLATELET
BASOS ABS: 0 10*3/uL (ref 0.0–0.1)
BASOS PCT: 0 %
EOS ABS: 0 10*3/uL (ref 0.0–0.7)
Eosinophils Relative: 0 %
HCT: 31.6 % — ABNORMAL LOW (ref 36.0–46.0)
Hemoglobin: 10 g/dL — ABNORMAL LOW (ref 12.0–15.0)
LYMPHS ABS: 41.2 10*3/uL — AB (ref 0.7–4.0)
Lymphocytes Relative: 93 %
MCH: 22.3 pg — ABNORMAL LOW (ref 26.0–34.0)
MCHC: 31.6 g/dL (ref 30.0–36.0)
MCV: 70.5 fL — AB (ref 78.0–100.0)
Monocytes Absolute: 0.4 10*3/uL (ref 0.1–1.0)
Monocytes Relative: 1 %
NEUTROS ABS: 2.7 10*3/uL (ref 1.7–7.7)
Neutrophils Relative %: 6 %
PLATELETS: 120 10*3/uL — AB (ref 150–400)
RBC: 4.48 MIL/uL (ref 3.87–5.11)
RDW: 14 % (ref 11.5–15.5)
WBC: 44.3 10*3/uL — ABNORMAL HIGH (ref 4.0–10.5)

## 2016-07-29 LAB — URINALYSIS, ROUTINE W REFLEX MICROSCOPIC
Bilirubin Urine: NEGATIVE
Glucose, UA: NEGATIVE mg/dL
Ketones, ur: NEGATIVE mg/dL
Nitrite: POSITIVE — AB
PROTEIN: NEGATIVE mg/dL
SPECIFIC GRAVITY, URINE: 1.007 (ref 1.005–1.030)
pH: 6.5 (ref 5.0–8.0)

## 2016-07-29 LAB — COMPREHENSIVE METABOLIC PANEL
ALBUMIN: 3.5 g/dL (ref 3.5–5.0)
ALT: 9 U/L — ABNORMAL LOW (ref 14–54)
ANION GAP: 6 (ref 5–15)
AST: 17 U/L (ref 15–41)
Alkaline Phosphatase: 88 U/L (ref 38–126)
BUN: 11 mg/dL (ref 6–20)
CHLORIDE: 98 mmol/L — AB (ref 101–111)
CO2: 26 mmol/L (ref 22–32)
Calcium: 8.2 mg/dL — ABNORMAL LOW (ref 8.9–10.3)
Creatinine, Ser: 0.6 mg/dL (ref 0.44–1.00)
GFR calc Af Amer: >60 mL/min (ref >60)
Glucose, Bld: 131 mg/dL — ABNORMAL HIGH (ref 65–99)
POTASSIUM: 3.9 mmol/L (ref 3.5–5.1)
Sodium: 130 mmol/L — ABNORMAL LOW (ref 135–145)
TOTAL PROTEIN: 5.6 g/dL — AB (ref 6.5–8.1)
Total Bilirubin: 0.4 mg/dL (ref 0.3–1.2)

## 2016-07-29 LAB — URINALYSIS, MICROSCOPIC (REFLEX)

## 2016-07-29 MED ORDER — SODIUM CHLORIDE 0.9 % IV BOLUS (SEPSIS)
500.0000 mL | Freq: Once | INTRAVENOUS | Status: AC
  Administered 2016-07-29: 500 mL via INTRAVENOUS

## 2016-07-29 MED ORDER — AMOXICILLIN-POT CLAVULANATE 875-125 MG PO TABS
1.0000 | ORAL_TABLET | Freq: Once | ORAL | Status: AC
  Administered 2016-07-29: 1 via ORAL
  Filled 2016-07-29: qty 1

## 2016-07-29 MED ORDER — CEPHALEXIN 500 MG PO CAPS: 500.0000 mg | ORAL_CAPSULE | Freq: Three times a day (TID) | ORAL | 0 refills | Status: DC

## 2016-07-29 MED ORDER — AMOXICILLIN-POT CLAVULANATE 875-125 MG PO TABS: 1.0000 | ORAL_TABLET | Freq: Three times a day (TID) | ORAL | 0 refills | Status: DC

## 2016-07-29 NOTE — ED Provider Notes (Signed)
[facility] DEPT [facility] Provider Note   CSN: [id number] Arrival date & time: 07/29/16  1624  By signing my name below, I, Gwenlyn Fudge, attest that this documentation has been prepared under the direction and in the presence of Carlisle Cater, PA-C. Electronically Signed: Gwenlyn Fudge, ED Scribe. 07/29/16. 5:39 PM.   History   Chief Complaint Chief Complaint  Patient presents with  . Abdominal Pain   The history is provided by the patient and the spouse. No language interpreter was used.   HPI Comments: Stefanie Henry is a 83 y.o. female with PMHx of Diverticulitis, CLL not undergoing any current treatment -- who presents to the Emergency Department complaining of gradual onset and worsening, waxing and waning, left lower quadrant abdominal pain for 10 days. Pain is temporarily relieved following bowel movements as well as with warm compresses. Pt has tried Tylenol with no relief to pain. Abdominal pain is mildly similar to diverticulitis. She states pain has radiated up to her LUQ and occasionally to the RLQ. Pt reports associated nausea, loose stools, and occasionally droplets of blood in stool. She has hx of CLL but is currently just monitoring with her PCP. She denies vomiting, dysuria, hematuria, fever.  Past Medical History:  Diagnosis Date  . Anemia, iron deficiency 06/06/2012  . Arthritis   . Asthma   . Blood transfusion without reported diagnosis   . Cataract   . CHF (congestive heart failure) ([place]) 12/20/2015  . Chronic lymphatic leukemia ([place])   . COPD (chronic obstructive pulmonary disease) ([place])   . Diverticulitis   . Gallstones   . GERD (gastroesophageal reflux disease)   . Neuromuscular disorder ([place])   . Shingles     Patient Active Problem List   Diagnosis Date Noted  . CHF (congestive heart failure) ([place]) 12/20/2015  . Dependent edema 12/18/2015  . UTI (urinary tract infection) 12/13/2015  . Chronic respiratory failure ([place]) 12/02/2015  . Asthma, moderate  persistent 08/27/2015  . Diverticulitis of colon 12/04/2014  . Abscess   . Colovesical fistula   . Early satiety   . Gastric polyps   . Diverticulitis of large intestine with abscess without bleeding   . Malnutrition of moderate degree ([place]) 11/02/2014  . Abnormal LFTs (liver function tests)   . Dysphagia   . Abscess, abdomen ([place]) 10/16/2014  . Hyponatremia 10/16/2014  . Diverticulitis of large intestine with perforation and abscess without bleeding 10/16/2014  . Thrombocytopenia ([place]) 10/16/2014  . Dyspnea 10/05/2014  . Midline low back pain without sciatica   . Back pain 06/09/2014  . Fall   . CLL (chronic lymphocytic leukemia) ([place])   . Gallstones without obstruction of gallbladder   . Lumbar compression fracture ([place]) 06/08/2014  . Chronic lymphocytic leukemia ([place]) 06/06/2012  . Anemia, iron deficiency 06/06/2012    Past Surgical History:  Procedure Laterality Date  . ESOPHAGOGASTRODUODENOSCOPY N/A 11/07/2014   Procedure: ESOPHAGOGASTRODUODENOSCOPY (EGD);  Surgeon: Gatha Mayer, MD;  Location: [facility];  Service: Endoscopy;  Laterality: N/A;  . none      OB History    No data available       Home Medications    Prior to Admission medications   Medication Sig Start Date End Date Taking? Authorizing Provider  albuterol (PROVENTIL) (2.5 MG/3ML) 0.083% nebulizer solution Take 3 mLs (2.5 mg total) by nebulization 2 (two) times daily. 12/02/15   Tammy S Parrett, NP  albuterol (VENTOLIN HFA) 108 (90 Base) MCG/ACT inhaler INHALE 1-2 PUFFS INTO THE LUNGS EVERY 6 HOURS AS  NEEDED FOR WHEEZING OR SHORTNESS OF BREATH. 12/02/15   Tammy S Parrett, NP  calcitonin, salmon, (MIACALCIN/FORTICAL) 200 UNIT/ACT nasal spray Place 1 spray into alternate nostrils daily. 11/18/15   Percell Miller Saguier, PA-C  feeding supplement, ENSURE ENLIVE, (ENSURE ENLIVE) LIQD Take 237 mLs by mouth daily. 12/15/15   Lavina Hamman, MD  gabapentin (NEURONTIN) 100 MG capsule TAKE 2 CAPSULES BY MOUTH 3 TIMES A  DAY AS NEEDED FOR NERVE PAIN 07/13/16   Mackie Pai, PA-C  hydrocortisone (ANUSOL-HC) 25 MG suppository Place 1 suppository (25 mg total) rectally 2 (two) times daily. 12/15/15   Lavina Hamman, MD  hyoscyamine (LEVSIN SL) 0.125 MG SL tablet PLACE 1 TABLET (0.125 MG TOTAL) UNDER THE TONGUE EVERY 8 (EIGHT) HOURS AS NEEDED. Patient taking differently: Take 0.125 mg by mouth every 8 (eight) hours as needed for cramping. . 11/18/15   Edward Saguier, PA-C  mometasone-formoterol (DULERA) 200-5 MCG/ACT AERO Inhale 2 puffs into the lungs 2 (two) times daily. 07/13/16   Rigoberto Noel, MD  NEOMYCIN-POLYMYXIN-HYDROCORTISONE (CORTISPORIN) 1 % SOLN otic solution PLACE 3 DROPS INTO THE LEFT EAR EVERY 8 (EIGHT) HOURS. 08/12/15   Mackie Pai, PA-C  omeprazole (PRILOSEC) 20 MG capsule TAKE ONE CAPSULE BY MOUTH EVERY DAY 07/20/16   Mackie Pai, PA-C  predniSONE (DELTASONE) 10 MG tablet TAKE 1 TABLET BY MOUTH EVERY DAY 06/30/16   [name], MD  predniSONE (DELTASONE) 20 MG tablet Take 1 tablet (20 mg total) by mouth daily with breakfast. 06/12/16   Tammy S Parrett, NP  psyllium (METAMUCIL) 58.6 % packet Take 1 packet by mouth daily. 12/15/15   Lavina Hamman, MD  Simethicone (GAS-X PO) Take 2 capsules by mouth daily.     Historical Provider, MD  sucralfate (CARAFATE) 1 g tablet TAKE 2 TABLETS IN THE MORNING AND AT BEDTIME 06/23/16   Ladene Artist, MD    Family History Family History  Problem Relation Age of Onset  . Diabetes Father   . Stomach cancer Sister   . Gallbladder disease Mother   . Colon cancer Neg Hx   . Colon polyps Neg Hx   . Esophageal cancer Neg Hx     Social History Social History  Substance Use Topics  . Smoking status: Never Smoker  . Smokeless tobacco: Never Used  . Alcohol use No     Allergies   Contrast media [iodinated diagnostic agents]; Fruit & vegetable daily [nutritional supplements]; Ibuprofen; and Other   Review of Systems Review of Systems  Constitutional:  Negative for fever.  HENT: Negative for rhinorrhea and sore throat.   Eyes: Negative for redness.  Respiratory: Negative for cough.   Cardiovascular: Negative for chest pain.  Gastrointestinal: Positive for abdominal pain, blood in stool (Droplets of blood), diarrhea and nausea. Negative for vomiting.  Genitourinary: Negative for dysuria and hematuria.  Musculoskeletal: Negative for myalgias.  Skin: Negative for rash.  Neurological: Negative for headaches.     Physical Exam Updated Vital Signs BP 105/56 (BP Location: Left Arm)   Pulse 96   Temp 98.2 F (36.8 C) (Oral)   Resp 20   SpO2 97%   Physical Exam  Constitutional: She appears well-developed and well-nourished.  HENT:  Head: Normocephalic and atraumatic.  Mouth/Throat: Oropharynx is clear and moist.  Eyes: Conjunctivae are normal. Right eye exhibits no discharge. Left eye exhibits no discharge.  Neck: Normal range of motion. Neck supple.  Cardiovascular: Normal rate, regular rhythm and normal heart sounds.   Pulmonary/Chest: Effort normal  and breath sounds normal.  Abdominal: Soft. She exhibits no distension and no mass. There is tenderness (left lower quadrant, mild). There is no guarding.  Neurological: She is alert.  Skin: Skin is warm and dry.  Psychiatric: She has a normal mood and affect.  Nursing note and vitals reviewed.    ED Treatments / Results  DIAGNOSTIC STUDIES: Oxygen Saturation is 97% on RA, normal by my interpretation.    COORDINATION OF CARE: 5:17 PM Discussed treatment plan with pt at bedside which includes CBC, CMP, Urinalysis and CT Abdomen Pelvis and pt agreed to plan.  Labs (all labs ordered are listed, but only abnormal results are displayed) Labs Reviewed  CBC WITH DIFFERENTIAL/PLATELET - Abnormal; Notable for the following:       Result Value   WBC 44.3 (*)    Hemoglobin 10.0 (*)    HCT 31.6 (*)    MCV 70.5 (*)    MCH 22.3 (*)    Platelets 120 (*)    Lymphs Abs 41.2 (*)    All  other components within normal limits  COMPREHENSIVE METABOLIC PANEL - Abnormal; Notable for the following:    Sodium 130 (*)    Chloride 98 (*)    Glucose, Bld 131 (*)    Calcium 8.2 (*)    Total Protein 5.6 (*)    ALT 9 (*)    All other components within normal limits  URINALYSIS, ROUTINE W REFLEX MICROSCOPIC - Abnormal; Notable for the following:    Hgb urine dipstick TRACE (*)    Nitrite POSITIVE (*)    Leukocytes, UA MODERATE (*)    All other components within normal limits  URINALYSIS, MICROSCOPIC (REFLEX) - Abnormal; Notable for the following:    Bacteria, UA MANY (*)    Squamous Epithelial / LPF 0-5 (*)    All other components within normal limits  URINE CULTURE  PATHOLOGIST SMEAR REVIEW    Radiology Ct Abdomen Pelvis Wo Contrast  Result Date: 07/29/2016 CLINICAL DATA:  Initial evaluation for acute left-sided abdominal pain, diarrhea. EXAM: CT ABDOMEN AND PELVIS WITHOUT CONTRAST TECHNIQUE: Multidetector CT imaging of the abdomen and pelvis was performed following the standard protocol without IV contrast. COMPARISON:  Prior CT from 12/12/2015. FINDINGS: Lower chest: Mild scattered atelectatic changes/ scarring present within the visualized lung bases. Visualized lungs are otherwise clear. Hepatobiliary: Liver demonstrates a normal unenhanced appearance. Gallbladder within normal limits. No biliary dilatation. Pancreas: Pancreas within normal limits. Spleen: Spleen within normal limits. Adrenals/Urinary Tract: Adrenal glands are normal. Kidneys equal in size without evidence for nephrolithiasis or hydronephrosis. Small exophytic cyst noted extending from the right kidney. No hydroureter. Bladder partially distended without acute abnormality. Stomach/Bowel: Stomach within normal limits. No evidence for bowel obstruction. Colonic diverticulosis present. Mild hazy stranding about a few diverticula within the left lower quadrant, consistent with mild/early acute diverticulitis. No other  acute inflammatory changes seen about the bowels. Vascular/Lymphatic: Mild aorto bi-iliac atherosclerotic disease. No adenopathy. Reproductive: Fibroid uterus noted.  Ovaries within normal limits. Other: No free air or fluid. Small fat containing paraumbilical hernia. Musculoskeletal: There is an acute to subacute appearing fracture through the superior endplate of L4. Minimal height loss without bony retropulsion. No other acute osseous abnormality. Chronic compression deformity involving the L1 vertebral body is stable. No worrisome lytic or blastic osseous lesions. IMPRESSION: 1. Mild hazy inflammatory stranding about a few diverticula within the left lower quadrant, consistent with mild/early acute diverticulitis. No complication identified. 2. Acute to subacute fracture of the superior  endplate of L4. 3. Fibroid uterus. Electronically Signed   By: Jeannine Boga M.D.   On: 07/29/2016 19:26    Procedures Procedures (including critical care time)  Medications Ordered in ED Medications - No data to display   Initial Impression / Assessment and Plan / ED Course  I have reviewed the triage vital signs and the nursing notes.  Pertinent labs & imaging results that were available during my care of the patient were reviewed by me and considered in my medical decision making (see chart for details).     Patient seen and examined. Work-up initiated.   Vital signs reviewed and are as follows: BP 139/75   Pulse 87   Temp 98.2 F (36.8 C) (Oral)   Resp 20   SpO2 98%   Pt d/w and seen by Dr. Alvino Chapel. Pt and son informed of all results. They would like to avoid hospitalization. Augmentin given for diverticulitis and keflex for UTI. Urine cx sent. Would prefer to avoid flagyl and cipro in an elderly patient.   Discussed need for follow-up with PCP and return with worsening. The patient was urged to return to the Emergency Department immediately with worsening of current symptoms, worsening  abdominal pain, persistent vomiting, blood noted in stools, fever, or any other concerns. The patient verbalized understanding.    I personally performed the services described in this documentation, which was scribed in my presence. The recorded information has been reviewed and is accurate.   Final Clinical Impressions(s) / ED Diagnoses   Final diagnoses:  Diverticulitis of large intestine without perforation or abscess without bleeding  Acute cystitis without hematuria   Pt with s/s c/w diverticulitis. Also cystitis. Tx as above. She appears well, ambulating in department with her walker. Pt and son wish for trial of outpatient abx over admission. We discussed need for close f/u and return with worsening. They seem reliable. Patient looks well otherwise.   New Prescriptions Discharge Medication List as of 07/29/2016  7:57 PM    START taking these medications   Details  amoxicillin-clavulanate (AUGMENTIN) 875-125 MG tablet Take 1 tablet by mouth every 8 (eight) hours., Starting Wed 07/29/2016, Print    cephALEXin (KEFLEX) 500 MG capsule Take 1 capsule (500 mg total) by mouth 3 (three) times daily., Starting Wed 07/29/2016, Print         Carlisle Cater, PA-C 07/29/16 2038    Davonna Belling, MD 07/29/16 2330

## 2016-07-29 NOTE — ED Notes (Signed)
ED Provider at bedside. 

## 2016-07-29 NOTE — ED Notes (Signed)
Patient transported to CT 

## 2016-07-29 NOTE — ED Triage Notes (Signed)
Per son pt with left side and pain, diarrhea x 7-10 days-pt NAD-presents to triage in w/c

## 2016-07-29 NOTE — Discharge Instructions (Signed)
Please read and follow all provided instructions.  Your diagnoses today include:  1. Diverticulitis of large intestine without perforation or abscess without bleeding   2. Acute cystitis without hematuria     Tests performed today include:  Blood counts and electrolytes  Blood tests to check liver and kidney function  Blood tests to check pancreas function  Urine test to look for infection - shows infection  CT abd/pelvis - shows diverticulitis  Vital signs. See below for your results today.   Medications prescribed:   Augmentin - antibiotic  You have been prescribed an antibiotic medicine: take the entire course of medicine even if you are feeling better. Stopping early can cause the antibiotic not to work.   Keflex (cephalexin) - antibiotic  You have been prescribed an antibiotic medicine: take the entire course of medicine even if you are feeling better. Stopping early can cause the antibiotic not to work.  Take any prescribed medications only as directed.  Home care instructions:   Follow any educational materials contained in this packet.  Follow-up instructions: Please follow-up with your primary care provider in the next 3- 5 days for further evaluation of your symptoms.    Return instructions:  SEEK IMMEDIATE MEDICAL ATTENTION IF:  The pain does not go away or becomes severe   A temperature above 101F develops   Repeated vomiting occurs (multiple episodes)   The pain becomes localized to portions of the abdomen. The right side could possibly be appendicitis. In an adult, the left lower portion of the abdomen could be colitis or diverticulitis.   Blood is being passed in stools or vomit (bright red or black tarry stools)   You develop chest pain, difficulty breathing, dizziness or fainting, or become confused, poorly responsive, or inconsolable (young children)  If you have any other emergent concerns regarding your health  Additional  Information: Abdominal (belly) pain can be caused by many things. Your caregiver performed an examination and possibly ordered blood/urine tests and imaging (CT scan, x-rays, ultrasound). Many cases can be observed and treated at home after initial evaluation in the emergency department. Even though you are being discharged home, abdominal pain can be unpredictable. Therefore, you need a repeated exam if your pain does not resolve, returns, or worsens. Most patients with abdominal pain don't have to be admitted to the hospital or have surgery, but serious problems like appendicitis and gallbladder attacks can start out as nonspecific pain. Many abdominal conditions cannot be diagnosed in one visit, so follow-up evaluations are very important.  Your vital signs today were: BP 139/75    Pulse 87    Temp 98.2 F (36.8 C) (Oral)    Resp 20    SpO2 98%  If your blood pressure (bp) was elevated above 135/85 this visit, please have this repeated by your doctor within one month. --------------

## 2016-07-30 ENCOUNTER — Telehealth (HOSPITAL_BASED_OUTPATIENT_CLINIC_OR_DEPARTMENT_OTHER): Payer: Self-pay | Admitting: *Deleted

## 2016-07-30 LAB — PATHOLOGIST SMEAR REVIEW

## 2016-07-30 NOTE — Telephone Encounter (Signed)
Pt's son called concerned that pt has a rash after taking one dose of amoxicillin in the ED yesterday. States he has not yet filled Rx. Also reports she has a fever of 102 (which she did not have yesterday). Advised (strongly) to bring pt back to ED for re-evaluation.

## 2016-07-31 ENCOUNTER — Emergency Department (HOSPITAL_BASED_OUTPATIENT_CLINIC_OR_DEPARTMENT_OTHER)
Admission: EM | Admit: 2016-07-31 | Discharge: 2016-07-31 | Disposition: A | Discharge status: Home / Self Care | Payer: Medicare Other | Attending: Emergency Medicine | Admitting: Emergency Medicine

## 2016-07-31 ENCOUNTER — Encounter (HOSPITAL_BASED_OUTPATIENT_CLINIC_OR_DEPARTMENT_OTHER): Payer: Self-pay | Admitting: Emergency Medicine

## 2016-07-31 ENCOUNTER — Ambulatory Visit (INDEPENDENT_AMBULATORY_CARE_PROVIDER_SITE_OTHER): Payer: Medicare Other | Admitting: Medical

## 2016-07-31 VITALS — BP 94/58 | HR 102 | Temp 98.4°F | Resp 16 | Ht 59.0 in | Wt 142.1 lb

## 2016-07-31 DIAGNOSIS — K5792 Diverticulitis of intestine, part unspecified, without perforation or abscess without bleeding: Secondary | ICD-10-CM

## 2016-07-31 DIAGNOSIS — J45909 Unspecified asthma, uncomplicated: Secondary | ICD-10-CM | POA: Insufficient documentation

## 2016-07-31 DIAGNOSIS — J449 Chronic obstructive pulmonary disease, unspecified: Secondary | ICD-10-CM | POA: Diagnosis not present

## 2016-07-31 DIAGNOSIS — Z79899 Other long term (current) drug therapy: Secondary | ICD-10-CM | POA: Insufficient documentation

## 2016-07-31 DIAGNOSIS — I509 Heart failure, unspecified: Secondary | ICD-10-CM | POA: Insufficient documentation

## 2016-07-31 DIAGNOSIS — K5732 Diverticulitis of large intestine without perforation or abscess without bleeding: Secondary | ICD-10-CM | POA: Insufficient documentation

## 2016-07-31 DIAGNOSIS — T7840XA Allergy, unspecified, initial encounter: Secondary | ICD-10-CM | POA: Diagnosis present

## 2016-07-31 MED ORDER — CIPROFLOXACIN HCL 500 MG PO TABS: 500.0000 mg | ORAL_TABLET | Freq: Two times a day (BID) | ORAL | 0 refills | Status: DC

## 2016-07-31 MED ORDER — METRONIDAZOLE 500 MG PO TABS: 500.0000 mg | ORAL_TABLET | Freq: Three times a day (TID) | ORAL | 0 refills | Status: DC

## 2016-07-31 MED ORDER — CIPROFLOXACIN IN D5W 400 MG/200ML IV SOLN
400.0000 mg | Freq: Once | INTRAVENOUS | Status: AC
  Administered 2016-07-31: 400 mg via INTRAVENOUS
  Filled 2016-07-31: qty 200

## 2016-07-31 MED ORDER — METRONIDAZOLE 500 MG PO TABS
500.0000 mg | ORAL_TABLET | Freq: Once | ORAL | Status: AC
  Administered 2016-07-31: 500 mg via ORAL
  Filled 2016-07-31: qty 1

## 2016-07-31 MED FILL — CIPROFLOXACIN HCL 500 MG TA: 500 | 10 days supply | Qty: 20 | Fill #0

## 2016-07-31 MED FILL — metroNIDAZOLE 500 MG TABS: 500 | 10 days supply | Qty: 30 | Fill #0

## 2016-07-31 NOTE — Progress Notes (Signed)
Subjective:    Patient ID: Stefanie Henry, female    DOB: 28-Nov-1929, 83 y.o.   MRN: [id number]  HPI   Pt in for follow from the ED. Pt had diverticulitis in ED. Pt had ct abdomen/pelvis CT. Diverticulitis history. Pt was given augmentin recently and keflex  rx in the ED. They did not get the prescription refill. Pt has elevated wbc chronic. Hx of CLL.   Pt only took one tablet of augmentin in  ED(per MA that roomed pt.  And got a rash on face but no sob or wheezing.  Pt son states she can take levofloxin(or cipro) and flagyl iv. But per son states  if taking oral will get rash?  Pt has chronic lymphocytic leukemia.  Wbc 44.3 and her hb and hct was 10/31.6.   In ED they thought no abscess or perforatopm by ED note review and sone report.  Pt pulse is mild elevated and her bp is lower?  Since DC from ED she has not complained about abd pain. Pt has been sleeping a lot last 2 days. But more energy today. Feels cold often. No sweats or fever.   No black stools. But occasional drop red blood. But hemorrhoid. No vomiting.     Review of Systems  Constitutional: Positive for chills and fatigue. Negative for diaphoresis and fever.  Respiratory: Negative for cough, shortness of breath and wheezing.   Cardiovascular: Negative for chest pain and palpitations.  Gastrointestinal: Positive for abdominal pain. Negative for blood in stool, constipation, diarrhea, nausea, rectal pain and vomiting.       On exam but mild epigastric.  Genitourinary: Negative for dysuria and urgency.  Skin: Negative for rash.  Neurological: Negative for dizziness and headaches.  Hematological: Negative for adenopathy. Does not bruise/bleed easily.  Psychiatric/Behavioral: Negative for behavioral problems and confusion.    Past Medical History:  Diagnosis Date  . Anemia, iron deficiency 06/06/2012  . Arthritis   . Asthma   . Blood transfusion without reported diagnosis   . Cataract   . CHF (congestive heart  failure) ([place]) 12/20/2015  . Chronic lymphatic leukemia ([place])   . COPD (chronic obstructive pulmonary disease) ([place])   . Diverticulitis   . Gallstones   . GERD (gastroesophageal reflux disease)   . Neuromuscular disorder ([place])   . Shingles      Social History   Social History  . Marital status: Married    Spouse name: N/A  . Number of children: 2  . Years of education: N/A   Occupational History  . Retired    Social History Main Topics  . Smoking status: Never Smoker  . Smokeless tobacco: Never Used  . Alcohol use No  . Drug use: No  . Sexual activity: Not on file   Other Topics Concern  . Not on file   Social History Narrative  . No narrative on file    Past Surgical History:  Procedure Laterality Date  . ESOPHAGOGASTRODUODENOSCOPY N/A 11/07/2014   Procedure: ESOPHAGOGASTRODUODENOSCOPY (EGD);  Surgeon: Gatha Mayer, MD;  Location: [facility];  Service: Endoscopy;  Laterality: N/A;  . none      Family History  Problem Relation Age of Onset  . Diabetes Father   . Stomach cancer Sister   . Gallbladder disease Mother   . Colon cancer Neg Hx   . Colon polyps Neg Hx   . Esophageal cancer Neg Hx     Allergies  Allergen Reactions  . Contrast Media [Iodinated Diagnostic  Agents] Other (See Comments)    Stomach discomfort  . Fruit & Vegetable Daily [Nutritional Supplements] Other (See Comments)    Citrus fruit causes stomach discomfort  . Ibuprofen Other (See Comments)    Stomach discomfort  . Other     Cannot take any antibiotics, causes rash  . Augmentin [Amoxicillin-Pot Clavulanate] Swelling and Rash  . Keflex [Cephalexin] Swelling and Rash    Current Outpatient Prescriptions on File Prior to Visit  Medication Sig Dispense Refill  . albuterol (PROVENTIL) (2.5 MG/3ML) 0.083% nebulizer solution Take 3 mLs (2.5 mg total) by nebulization 2 (two) times daily. 180 mL 3  . albuterol (VENTOLIN HFA) 108 (90 Base) MCG/ACT inhaler INHALE 1-2 PUFFS INTO THE LUNGS  EVERY 6 HOURS AS NEEDED FOR WHEEZING OR SHORTNESS OF BREATH. 18 Inhaler 0  . calcitonin, salmon, (MIACALCIN/FORTICAL) 200 UNIT/ACT nasal spray Place 1 spray into alternate nostrils daily. 3.7 mL 12  . feeding supplement, ENSURE ENLIVE, (ENSURE ENLIVE) LIQD Take 237 mLs by mouth daily. 237 mL 12  . gabapentin (NEURONTIN) 100 MG capsule TAKE 2 CAPSULES BY MOUTH 3 TIMES A DAY AS NEEDED FOR NERVE PAIN 180 capsule 2  . hydrocortisone (ANUSOL-HC) 25 MG suppository Place 1 suppository (25 mg total) rectally 2 (two) times daily. 12 suppository 0  . hyoscyamine (LEVSIN SL) 0.125 MG SL tablet PLACE 1 TABLET (0.125 MG TOTAL) UNDER THE TONGUE EVERY 8 (EIGHT) HOURS AS NEEDED. (Patient taking differently: Take 0.125 mg by mouth every 8 (eight) hours as needed for cramping. Marland Kitchen) 60 tablet 2  . mometasone-formoterol (DULERA) 200-5 MCG/ACT AERO Inhale 2 puffs into the lungs 2 (two) times daily. 13 g 5  . NEOMYCIN-POLYMYXIN-HYDROCORTISONE (CORTISPORIN) 1 % SOLN otic solution PLACE 3 DROPS INTO THE LEFT EAR EVERY 8 (EIGHT) HOURS. (Patient taking differently: PLACE 3 DROPS INTO THE LEFT EAR EVERY 8 (EIGHT) HOURS AS NEEDED) 10 mL 0  . omeprazole (PRILOSEC) 20 MG capsule TAKE ONE CAPSULE BY MOUTH EVERY DAY 90 capsule 1  . predniSONE (DELTASONE) 10 MG tablet TAKE 1 TABLET BY MOUTH EVERY DAY 30 tablet 0  . psyllium (METAMUCIL) 58.6 % packet Take 1 packet by mouth daily. 30 each 0  . Simethicone (GAS-X PO) Take 2 capsules by mouth daily.     . sucralfate (CARAFATE) 1 g tablet TAKE 2 TABLETS IN THE MORNING AND AT BEDTIME 120 tablet 0   No current facility-administered medications on file prior to visit.     BP (!) 94/58 (BP Location: Left Arm, Patient Position: Sitting, Cuff Size: Large)   Pulse (!) 102   Temp 98.4 F (36.9 C) (Oral)   Resp 16   Ht 4\' 11"  (1.499 m)   Wt 142 lb 2 oz (64.5 kg)   SpO2 96%   BMI 28.71 kg/m       Objective:   Physical Exam  General Appearance- Not in acute distress.  HEENT Eyes-  Scleraeral/Conjuntiva-bilat- Not Yellow. Mouth & Throat- Normal.  Chest and Lung Exam Auscultation: Breath sounds:-Normal. Adventitious sounds:- No Adventitious sounds.  Cardiovascular Auscultation:Rythm - Regular. Heart Sounds -Normal heart sounds.  Abdomen Inspection:-Inspection Normal.  Palpation/Perucssion: Palpation and Percussion of the abdomen reveal- fainteepigastric Tender, No Rebound tenderness, No rigidity(Guarding) and No Palpable abdominal masses.  Liver:-Normal.  Spleen:- Normal.   Back- no cva tenderness      Assessment & Plan:  With your history of CLL, anemia, recent diverticulitis, unable to take oral meds, and change in vital signs, I do think it is best to be evaluated  in the ED. Test can be done to see if you have blood loss, dehyration or worsening diverticulitis(absess or rupture?).  After work up done then decision can be made if further outpatient treatment can be done. Your intolerance to oral meds CLL, and anemia may make outpatient treatment difficult.  I notified ED MD that you are doing down now  Follow up as they determine.  It talked with Dr. Tamera Punt and she considered pt stable and not in need of repeat work up. She helped [place] out by giving first dose of iv flagyl and cipro. I discussed with Dr. Etter Sjogren possibility of getting home health to give iv vs oral medication. Dr. Etter Sjogren expresses that if pt able to use iv cipro and flagyl she should be able to take oral form. So ED did write the rx. Will follow up with pt on Monday to see how she did. Home health might be option in near future but at same time with pt CLL hospitalization might be better option if  iv needed. Will discuss further plan on Monday with Dr. Etter Sjogren.

## 2016-07-31 NOTE — Patient Instructions (Signed)
With your history of CLL, anemia, recent diverticulitis, unable to take oral meds, and change in vital signs, I do think it is best to be evaluated in the ED. Test can be done to see if you have blood loss, dehyration or worsening diverticulitis(absess or rupture?).  After work up done then decision can be made if further outpatient treatment can be done. Your intolerance to oral meds CLL, and anemia may make outpatient treatment difficult.  I notified ED MD that you are doing down now  Follow up as they determine.

## 2016-07-31 NOTE — ED Triage Notes (Signed)
Pt seen here 2 days ago per family and given rx for abx. She had a reaction to it of rash on hands and swelling of face. Has been taking Zyrtec since.  Saw PMD upstairs today for this reaction and to get abx changed.  Family sts that pmd send her down here due to low bp - 94/58.

## 2016-07-31 NOTE — ED Provider Notes (Signed)
[facility] Provider Note   CSN: [id number] Arrival date & time: 07/31/16  1459     History   Chief Complaint Chief Complaint  Patient presents with  . Send from pmd office    HPI Stefanie Henry is a 83 y.o. female.  Patient is a 83 year old female who presents with an allergic reaction. She was seen in the emergency department 2 days ago and diagnosed with diverticulitis. She was started on Augmentin and Keflex. She was given a dose of Augmentin in the ED 2 days ago. Her son states that that evening she developed an allergic reaction which included rash and itching to her hands and some swelling of her face. There is no associated angioedema or shortness of breath. He is been treating her with Zyrtec and the symptoms have been improving. He took her to her PCPs office this afternoon to see about getting the antibiotics changed and while she was there she had an episode of hypotension with a systolic blood pressure in the 90s. This was confirmed with a manual blood pressure per report. On arrival here patient is without any complaints. She has a normal blood pressure. She denies any abdominal pain. Her son states she hasn't had a fever in the last 2 days. There's been no nausea or vomiting. Her allergic reaction has essentially resolved although she still has some redness and itching to her hands.      Past Medical History:  Diagnosis Date  . Anemia, iron deficiency 06/06/2012  . Arthritis   . Asthma   . Blood transfusion without reported diagnosis   . Cataract   . CHF (congestive heart failure) ([place]) 12/20/2015  . Chronic lymphatic leukemia ([place])   . COPD (chronic obstructive pulmonary disease) ([place])   . Diverticulitis   . Gallstones   . GERD (gastroesophageal reflux disease)   . Neuromuscular disorder ([place])   . Shingles     Patient Active Problem List   Diagnosis Date Noted  . CHF (congestive heart failure) ([place]) 12/20/2015  . Dependent edema 12/18/2015  . UTI  (urinary tract infection) 12/13/2015  . Chronic respiratory failure ([place]) 12/02/2015  . Asthma, moderate persistent 08/27/2015  . Diverticulitis of colon 12/04/2014  . Abscess   . Colovesical fistula   . Early satiety   . Gastric polyps   . Diverticulitis of large intestine with abscess without bleeding   . Malnutrition of moderate degree ([place]) 11/02/2014  . Abnormal LFTs (liver function tests)   . Dysphagia   . Abscess, abdomen ([place]) 10/16/2014  . Hyponatremia 10/16/2014  . Diverticulitis of large intestine with perforation and abscess without bleeding 10/16/2014  . Thrombocytopenia ([place]) 10/16/2014  . Dyspnea 10/05/2014  . Midline low back pain without sciatica   . Back pain 06/09/2014  . Fall   . CLL (chronic lymphocytic leukemia) ([place])   . Gallstones without obstruction of gallbladder   . Lumbar compression fracture ([place]) 06/08/2014  . Chronic lymphocytic leukemia ([place]) 06/06/2012  . Anemia, iron deficiency 06/06/2012    Past Surgical History:  Procedure Laterality Date  . ESOPHAGOGASTRODUODENOSCOPY N/A 11/07/2014   Procedure: ESOPHAGOGASTRODUODENOSCOPY (EGD);  Surgeon: Gatha Mayer, MD;  Location: [facility];  Service: Endoscopy;  Laterality: N/A;  . none      OB History    No data available       Home Medications    Prior to Admission medications   Medication Sig Start Date End Date Taking? Authorizing Provider  albuterol (PROVENTIL) (2.5 MG/3ML) 0.083% nebulizer  solution Take 3 mLs (2.5 mg total) by nebulization 2 (two) times daily. 12/02/15   Tammy S Parrett, NP  albuterol (VENTOLIN HFA) 108 (90 Base) MCG/ACT inhaler INHALE 1-2 PUFFS INTO THE LUNGS EVERY 6 HOURS AS NEEDED FOR WHEEZING OR SHORTNESS OF BREATH. 12/02/15   Tammy S Parrett, NP  calcitonin, salmon, (MIACALCIN/FORTICAL) 200 UNIT/ACT nasal spray Place 1 spray into alternate nostrils daily. 11/18/15   Percell Miller Saguier, PA-C  ciprofloxacin (CIPRO) 500 MG tablet Take 1 tablet (500 mg total) by mouth 2 (two)  times daily. One po bid x 7 days 07/31/16   Malvin Johns, MD  feeding supplement, ENSURE ENLIVE, (ENSURE ENLIVE) LIQD Take 237 mLs by mouth daily. 12/15/15   Lavina Hamman, MD  gabapentin (NEURONTIN) 100 MG capsule TAKE 2 CAPSULES BY MOUTH 3 TIMES A DAY AS NEEDED FOR NERVE PAIN 07/13/16   Mackie Pai, PA-C  hydrocortisone (ANUSOL-HC) 25 MG suppository Place 1 suppository (25 mg total) rectally 2 (two) times daily. 12/15/15   Lavina Hamman, MD  hyoscyamine (LEVSIN SL) 0.125 MG SL tablet PLACE 1 TABLET (0.125 MG TOTAL) UNDER THE TONGUE EVERY 8 (EIGHT) HOURS AS NEEDED. Patient taking differently: Take 0.125 mg by mouth every 8 (eight) hours as needed for cramping. . 11/18/15   Percell Miller Saguier, PA-C  metroNIDAZOLE (FLAGYL) 500 MG tablet Take 1 tablet (500 mg total) by mouth 3 (three) times daily. One po bid x 7 days 07/31/16   Malvin Johns, MD  mometasone-formoterol (DULERA) 200-5 MCG/ACT AERO Inhale 2 puffs into the lungs 2 (two) times daily. 07/13/16   Rigoberto Noel, MD  NEOMYCIN-POLYMYXIN-HYDROCORTISONE (CORTISPORIN) 1 % SOLN otic solution PLACE 3 DROPS INTO THE LEFT EAR EVERY 8 (EIGHT) HOURS. Patient taking differently: PLACE 3 DROPS INTO THE LEFT EAR EVERY 8 (EIGHT) HOURS AS NEEDED 08/12/15   Mackie Pai, PA-C  omeprazole (PRILOSEC) 20 MG capsule TAKE ONE CAPSULE BY MOUTH EVERY DAY 07/20/16   Mackie Pai, PA-C  predniSONE (DELTASONE) 10 MG tablet TAKE 1 TABLET BY MOUTH EVERY DAY 06/30/16   [name], MD  psyllium (METAMUCIL) 58.6 % packet Take 1 packet by mouth daily. 12/15/15   Lavina Hamman, MD  Simethicone (GAS-X PO) Take 2 capsules by mouth daily.     Historical Provider, MD  sucralfate (CARAFATE) 1 g tablet TAKE 2 TABLETS IN THE MORNING AND AT BEDTIME 06/23/16   Ladene Artist, MD    Family History Family History  Problem Relation Age of Onset  . Diabetes Father   . Stomach cancer Sister   . Gallbladder disease Mother   . Colon cancer Neg Hx   . Colon polyps Neg Hx   . Esophageal  cancer Neg Hx     Social History Social History  Substance Use Topics  . Smoking status: Never Smoker  . Smokeless tobacco: Never Used  . Alcohol use No     Allergies   Contrast media [iodinated diagnostic agents]; Fruit & vegetable daily [nutritional supplements]; Ibuprofen; Other; Augmentin [amoxicillin-pot clavulanate]; and Keflex [cephalexin]   Review of Systems Review of Systems  Constitutional: Negative for chills, diaphoresis, fatigue and fever.  HENT: Negative for congestion, rhinorrhea and sneezing.   Eyes: Negative.   Respiratory: Negative for cough, chest tightness and shortness of breath.   Cardiovascular: Negative for chest pain and leg swelling.  Gastrointestinal: Negative for abdominal pain, blood in stool, diarrhea, nausea and vomiting.  Genitourinary: Negative for difficulty urinating, flank pain, frequency and hematuria.  Musculoskeletal: Negative for arthralgias and  back pain.  Skin: Positive for rash.  Neurological: Negative for dizziness, speech difficulty, weakness, numbness and headaches.     Physical Exam Updated Vital Signs BP 114/68 (BP Location: Right Arm)   Pulse 96   Temp 98.6 F (37 C) (Oral)   Resp 18   Ht 4\' 11"  (1.499 m)   Wt 142 lb 2 oz (64.5 kg)   SpO2 100%   BMI 28.71 kg/m   Physical Exam  Constitutional: She is oriented to person, place, and time. She appears well-developed and well-nourished.  HENT:  Head: Normocephalic and atraumatic.  No angioedema  Eyes: Pupils are equal, round, and reactive to light.  Neck: Normal range of motion. Neck supple.  Cardiovascular: Normal rate, regular rhythm and normal heart sounds.   Pulmonary/Chest: Effort normal and breath sounds normal. No respiratory distress. She has no wheezes. She has no rales. She exhibits no tenderness.  Abdominal: Soft. Bowel sounds are normal. There is no tenderness. There is no rebound and no guarding.  Musculoskeletal: Normal range of motion. She exhibits no  edema.  Lymphadenopathy:    She has no cervical adenopathy.  Neurological: She is alert and oriented to person, place, and time.  Skin: Skin is warm and dry. No rash noted.  Mild erythema to the palmar surface of both hands and no other rash noted.  Psychiatric: She has a normal mood and affect.     ED Treatments / Results  Labs (all labs ordered are listed, but only abnormal results are displayed) Labs Reviewed - No data to display  EKG  EKG Interpretation None       Radiology Ct Abdomen Pelvis Wo Contrast  Result Date: 07/29/2016 CLINICAL DATA:  Initial evaluation for acute left-sided abdominal pain, diarrhea. EXAM: CT ABDOMEN AND PELVIS WITHOUT CONTRAST TECHNIQUE: Multidetector CT imaging of the abdomen and pelvis was performed following the standard protocol without IV contrast. COMPARISON:  Prior CT from 12/12/2015. FINDINGS: Lower chest: Mild scattered atelectatic changes/ scarring present within the visualized lung bases. Visualized lungs are otherwise clear. Hepatobiliary: Liver demonstrates a normal unenhanced appearance. Gallbladder within normal limits. No biliary dilatation. Pancreas: Pancreas within normal limits. Spleen: Spleen within normal limits. Adrenals/Urinary Tract: Adrenal glands are normal. Kidneys equal in size without evidence for nephrolithiasis or hydronephrosis. Small exophytic cyst noted extending from the right kidney. No hydroureter. Bladder partially distended without acute abnormality. Stomach/Bowel: Stomach within normal limits. No evidence for bowel obstruction. Colonic diverticulosis present. Mild hazy stranding about a few diverticula within the left lower quadrant, consistent with mild/early acute diverticulitis. No other acute inflammatory changes seen about the bowels. Vascular/Lymphatic: Mild aorto bi-iliac atherosclerotic disease. No adenopathy. Reproductive: Fibroid uterus noted.  Ovaries within normal limits. Other: No free air or fluid. Small fat  containing paraumbilical hernia. Musculoskeletal: There is an acute to subacute appearing fracture through the superior endplate of L4. Minimal height loss without bony retropulsion. No other acute osseous abnormality. Chronic compression deformity involving the L1 vertebral body is stable. No worrisome lytic or blastic osseous lesions. IMPRESSION: 1. Mild hazy inflammatory stranding about a few diverticula within the left lower quadrant, consistent with mild/early acute diverticulitis. No complication identified. 2. Acute to subacute fracture of the superior endplate of L4. 3. Fibroid uterus. Electronically Signed   By: Jeannine Boga M.D.   On: 07/29/2016 19:26    Procedures Procedures (including critical care time)  Medications Ordered in ED Medications  ciprofloxacin (CIPRO) IVPB 400 mg (400 mg Intravenous New Bag/Given 07/31/16 1637)  metroNIDAZOLE (  FLAGYL) tablet 500 mg (500 mg Oral Given 07/31/16 1637)     Initial Impression / Assessment and Plan / ED Course  I have reviewed the triage vital signs and the nursing notes.  Pertinent labs & imaging results that were available during my care of the patient were reviewed by me and considered in my medical decision making (see chart for details).     Patient is well-appearing. She has no abdominal tenderness on exam. She has no hypotension here. I rechecked her blood pressure and it was 124/70. She's angulated without symptoms. She has no other markers for sepsis. I reviewed her labs from 2 days ago. Her urinalysis did grow out Klebsiella. Sensitivities are not back. I did discuss with the son that we should change her antibiotics to Cipro and Flagyl. She reportedly has taken form: Limits and Flagyl in the past for diverticulitis although the son states that she does better with the IV form and has less of a reaction. He apparently has talked to her PCP about sending him home health to get IV antibiotics. I spoke with Mackie Pai, PA-C he  was attempting to set up the antibiotics when home health although the decision was made to have the patient take oral antibiotics over the weekend. She was given 1 dose of IV antibiotics in the ED. I did give her prescription for Cipro and Flagyl. I informed the son to contact their office on Monday. Strict return precautions were given.  Final Clinical Impressions(s) / ED Diagnoses   Final diagnoses:  Diverticulitis of intestine without perforation or abscess without bleeding, unspecified part of intestinal tract    New Prescriptions New Prescriptions   CIPROFLOXACIN (CIPRO) 500 MG TABLET    Take 1 tablet (500 mg total) by mouth 2 (two) times daily. One po bid x 7 days   METRONIDAZOLE (FLAGYL) 500 MG TABLET    Take 1 tablet (500 mg total) by mouth 3 (three) times daily. One po bid x 7 days     Malvin Johns, MD 07/31/16 1759

## 2016-07-31 NOTE — Progress Notes (Signed)
Pre visit review using our clinic review tool, if applicable. No additional management support is needed unless otherwise documented below in the visit note/SLS  

## 2016-08-01 LAB — URINE CULTURE: Culture: >=100000 — AB

## 2016-08-02 ENCOUNTER — Telehealth: Payer: Self-pay

## 2016-08-02 DIAGNOSIS — M25552 Pain in left hip: Secondary | ICD-10-CM

## 2016-08-02 DIAGNOSIS — D649 Anemia, unspecified: Secondary | ICD-10-CM

## 2016-08-02 DIAGNOSIS — C9110 Chronic lymphocytic leukemia of B-cell type not having achieved remission: Secondary | ICD-10-CM

## 2016-08-02 NOTE — Telephone Encounter (Signed)
Post ED Visit - Positive Culture Follow-up  Culture report reviewed by antimicrobial stewardship pharmacist:  []  Elenor Quinones, Pharm.D. []  Heide Guile, Pharm.D., BCPS []  Parks Neptune, Pharm.D. [x]  Alycia Rossetti, Pharm.D., BCPS []  [place], Pharm.D., BCPS, AAHIVP []  Legrand Como, Pharm.D., BCPS, AAHIVP []  Milus Glazier, Pharm.D. []  Stephens November, [place].D.  Positive urine culture Treated with Cephalexin, organism sensitive to the same and no further patient follow-up is required at this time.  Genia Del 08/02/2016, 2:10 PM

## 2016-08-03 ENCOUNTER — Other Ambulatory Visit: Payer: Self-pay | Admitting: Hematology & Oncology

## 2016-08-04 ENCOUNTER — Other Ambulatory Visit: Payer: Self-pay | Admitting: Pulmonary Disease

## 2016-08-06 ENCOUNTER — Ambulatory Visit: Payer: Medicare Other | Admitting: Pulmonary Disease

## 2016-08-06 ENCOUNTER — Encounter (HOSPITAL_BASED_OUTPATIENT_CLINIC_OR_DEPARTMENT_OTHER): Payer: Self-pay | Admitting: *Deleted

## 2016-08-06 ENCOUNTER — Emergency Department (HOSPITAL_BASED_OUTPATIENT_CLINIC_OR_DEPARTMENT_OTHER)
Admission: EM | Admit: 2016-08-06 | Discharge: 2016-08-07 | Disposition: A | Discharge status: Home / Self Care | Payer: Medicare Other | Attending: Emergency Medicine | Admitting: Emergency Medicine

## 2016-08-06 ENCOUNTER — Telehealth: Payer: Self-pay | Admitting: Medical

## 2016-08-06 ENCOUNTER — Emergency Department (HOSPITAL_BASED_OUTPATIENT_CLINIC_OR_DEPARTMENT_OTHER): Payer: Medicare Other

## 2016-08-06 DIAGNOSIS — Z79899 Other long term (current) drug therapy: Secondary | ICD-10-CM | POA: Insufficient documentation

## 2016-08-06 DIAGNOSIS — M79652 Pain in left thigh: Secondary | ICD-10-CM | POA: Diagnosis not present

## 2016-08-06 DIAGNOSIS — M25552 Pain in left hip: Secondary | ICD-10-CM | POA: Diagnosis not present

## 2016-08-06 DIAGNOSIS — M79606 Pain in leg, unspecified: Secondary | ICD-10-CM

## 2016-08-06 DIAGNOSIS — J45909 Unspecified asthma, uncomplicated: Secondary | ICD-10-CM | POA: Diagnosis not present

## 2016-08-06 DIAGNOSIS — I509 Heart failure, unspecified: Secondary | ICD-10-CM | POA: Diagnosis not present

## 2016-08-06 DIAGNOSIS — J449 Chronic obstructive pulmonary disease, unspecified: Secondary | ICD-10-CM | POA: Diagnosis not present

## 2016-08-06 DIAGNOSIS — M1612 Unilateral primary osteoarthritis, left hip: Secondary | ICD-10-CM | POA: Diagnosis not present

## 2016-08-06 LAB — CBC WITH DIFFERENTIAL/PLATELET
BASOS ABS: 0 10*3/uL (ref 0.0–0.1)
Basophils Relative: 0 %
EOS ABS: 0 10*3/uL (ref 0.0–0.7)
Eosinophils Relative: 0 %
HEMATOCRIT: 32.1 % — AB (ref 36.0–46.0)
Hemoglobin: 10.3 g/dL — ABNORMAL LOW (ref 12.0–15.0)
LYMPHS ABS: 40.1 10*3/uL — AB (ref 0.7–4.0)
Lymphocytes Relative: 93 %
MCH: 22.5 pg — ABNORMAL LOW (ref 26.0–34.0)
MCHC: 32.1 g/dL (ref 30.0–36.0)
MCV: 70.1 fL — ABNORMAL LOW (ref 78.0–100.0)
MONOS PCT: 1 %
Monocytes Absolute: 0.4 10*3/uL (ref 0.1–1.0)
Neutro Abs: 2.6 10*3/uL (ref 1.7–7.7)
Neutrophils Relative %: 6 %
PLATELETS: 159 10*3/uL (ref 150–400)
RBC: 4.58 MIL/uL (ref 3.87–5.11)
RDW: 14 % (ref 11.5–15.5)
WBC: 43.1 10*3/uL — AB (ref 4.0–10.5)

## 2016-08-06 LAB — URINALYSIS, ROUTINE W REFLEX MICROSCOPIC
Bilirubin Urine: NEGATIVE
Glucose, UA: NEGATIVE mg/dL
HGB URINE DIPSTICK: NEGATIVE
KETONES UR: NEGATIVE mg/dL
Nitrite: NEGATIVE
PROTEIN: NEGATIVE mg/dL
Specific Gravity, Urine: 1.006 (ref 1.005–1.030)
pH: 7 (ref 5.0–8.0)

## 2016-08-06 LAB — BASIC METABOLIC PANEL
ANION GAP: 9 (ref 5–15)
BUN: 8 mg/dL (ref 6–20)
CO2: 25 mmol/L (ref 22–32)
CREATININE: 0.6 mg/dL (ref 0.44–1.00)
Calcium: 8.3 mg/dL — ABNORMAL LOW (ref 8.9–10.3)
Chloride: 100 mmol/L — ABNORMAL LOW (ref 101–111)
Glucose, Bld: 108 mg/dL — ABNORMAL HIGH (ref 65–99)
Potassium: 3.6 mmol/L (ref 3.5–5.1)
SODIUM: 134 mmol/L — AB (ref 135–145)

## 2016-08-06 LAB — URINALYSIS, MICROSCOPIC (REFLEX)

## 2016-08-06 MED ORDER — HYDROCODONE-ACETAMINOPHEN 5-325 MG PO TABS: ORAL_TABLET | ORAL | 0 refills | Status: DC

## 2016-08-06 MED ORDER — MORPHINE SULFATE (PF) 2 MG/ML IV SOLN
2.0000 mg | Freq: Once | INTRAVENOUS | Status: AC
  Administered 2016-08-06: 2 mg via INTRAVENOUS
  Filled 2016-08-06: qty 1

## 2016-08-06 MED ORDER — ONDANSETRON HCL 4 MG/2ML IJ SOLN
4.0000 mg | Freq: Once | INTRAMUSCULAR | Status: AC
  Administered 2016-08-06: 4 mg via INTRAVENOUS
  Filled 2016-08-06: qty 2

## 2016-08-06 NOTE — ED Provider Notes (Signed)
[facility] Provider Note   CSN: [id number] Arrival date & time: 08/06/16  1627  By signing my name below, I, Reola Mosher, attest that this documentation has been prepared under the direction and in the presence of Carlisle Cater, PA-C.   Electronically Signed: Reola Mosher, ED Scribe. 08/06/16. 6:46 PM.  History   Chief Complaint Chief Complaint  Patient presents with  . Hip Pain   The history is provided by the patient, medical records and a relative. No language interpreter was used.    HPI Comments: Stefanie Henry is a 83 y.o. female with a h/o anemia, CLL (not undergoing treatment), CHF, COPD, and neuromuscular disorder, who presents to the Emergency Department complaining of persistent left sided hip pain which began six days ago, gradually worsening over the past three days. Pt reports that she woke up with her pain and there has been no known trauma, fall, or injury otherwise to the hip to precipitate her pain. Her pain is exacerbated with weight bearing and ambulation. Pt typically walks with a walker device at home, but her son notes that he has had to help her significantly more even with the use of her walked. She was given Naproxen five hours ago with temporary relief of her pain. Her son additionally notes that her pain is alleviated with sitting still. Pt was seen in the ED for LLQ abdominal pain ~9 days ago and again ~6 days ago. During her first visit she was placed on Augmentin for a UTI; however, during her second visit she was switched onto courses of Ciprofloxacin and Flagyl d/t an allergic reaction and her prior urine cultures growing Klebsiella. She denies numbness, paraesthesias, fever, or any other associated symptoms.   Past Medical History:  Diagnosis Date  . Anemia, iron deficiency 06/06/2012  . Arthritis   . Asthma   . Blood transfusion without reported diagnosis   . Cataract   . CHF (congestive heart failure) ([place]) 12/20/2015  .  Chronic lymphatic leukemia ([place])   . COPD (chronic obstructive pulmonary disease) ([place])   . Diverticulitis   . Gallstones   . GERD (gastroesophageal reflux disease)   . Neuromuscular disorder ([place])   . Shingles    Patient Active Problem List   Diagnosis Date Noted  . CHF (congestive heart failure) ([place]) 12/20/2015  . Dependent edema 12/18/2015  . UTI (urinary tract infection) 12/13/2015  . Chronic respiratory failure ([place]) 12/02/2015  . Asthma, moderate persistent 08/27/2015  . Diverticulitis of colon 12/04/2014  . Abscess   . Colovesical fistula   . Early satiety   . Gastric polyps   . Diverticulitis of large intestine with abscess without bleeding   . Malnutrition of moderate degree ([place]) 11/02/2014  . Abnormal LFTs (liver function tests)   . Dysphagia   . Abscess, abdomen ([place]) 10/16/2014  . Hyponatremia 10/16/2014  . Diverticulitis of large intestine with perforation and abscess without bleeding 10/16/2014  . Thrombocytopenia ([place]) 10/16/2014  . Dyspnea 10/05/2014  . Midline low back pain without sciatica   . Back pain 06/09/2014  . Fall   . CLL (chronic lymphocytic leukemia) ([place])   . Gallstones without obstruction of gallbladder   . Lumbar compression fracture ([place]) 06/08/2014  . Chronic lymphocytic leukemia ([place]) 06/06/2012  . Anemia, iron deficiency 06/06/2012   Past Surgical History:  Procedure Laterality Date  . ESOPHAGOGASTRODUODENOSCOPY N/A 11/07/2014   Procedure: ESOPHAGOGASTRODUODENOSCOPY (EGD);  Surgeon: Gatha Mayer, MD;  Location: [facility];  Service: Endoscopy;  Laterality:  N/A;  . none     OB History    No data available     Home Medications    Prior to Admission medications   Medication Sig Start Date End Date Taking? Authorizing Provider  albuterol (PROVENTIL) (2.5 MG/3ML) 0.083% nebulizer solution Take 3 mLs (2.5 mg total) by nebulization 2 (two) times daily. 12/02/15   Tammy S Parrett, NP  albuterol (PROVENTIL) (2.5 MG/3ML) 0.083%  nebulizer solution USE 1 VIAL VIA NEBULIZER TWICE A DAY (DX J45.40) 08/04/16   Rigoberto Noel, MD  albuterol (VENTOLIN HFA) 108 (90 Base) MCG/ACT inhaler INHALE 1-2 PUFFS INTO THE LUNGS EVERY 6 HOURS AS NEEDED FOR WHEEZING OR SHORTNESS OF BREATH. 12/02/15   Tammy S Parrett, NP  calcitonin, salmon, (MIACALCIN/FORTICAL) 200 UNIT/ACT nasal spray Place 1 spray into alternate nostrils daily. 11/18/15   Percell Miller Saguier, PA-C  ciprofloxacin (CIPRO) 500 MG tablet Take 1 tablet (500 mg total) by mouth 2 (two) times daily. One po bid x 7 days 07/31/16   Malvin Johns, MD  feeding supplement, ENSURE ENLIVE, (ENSURE ENLIVE) LIQD Take 237 mLs by mouth daily. 12/15/15   Lavina Hamman, MD  gabapentin (NEURONTIN) 100 MG capsule TAKE 2 CAPSULES BY MOUTH 3 TIMES A DAY AS NEEDED FOR NERVE PAIN 07/13/16   Mackie Pai, PA-C  hydrocortisone (ANUSOL-HC) 25 MG suppository Place 1 suppository (25 mg total) rectally 2 (two) times daily. 12/15/15   Lavina Hamman, MD  hyoscyamine (LEVSIN SL) 0.125 MG SL tablet PLACE 1 TABLET (0.125 MG TOTAL) UNDER THE TONGUE EVERY 8 (EIGHT) HOURS AS NEEDED. Patient taking differently: Take 0.125 mg by mouth every 8 (eight) hours as needed for cramping. . 11/18/15   Percell Miller Saguier, PA-C  metroNIDAZOLE (FLAGYL) 500 MG tablet Take 1 tablet (500 mg total) by mouth 3 (three) times daily. One po bid x 7 days 07/31/16   Malvin Johns, MD  mometasone-formoterol (DULERA) 200-5 MCG/ACT AERO Inhale 2 puffs into the lungs 2 (two) times daily. 07/13/16   Rigoberto Noel, MD  NEOMYCIN-POLYMYXIN-HYDROCORTISONE (CORTISPORIN) 1 % SOLN otic solution PLACE 3 DROPS INTO THE LEFT EAR EVERY 8 (EIGHT) HOURS. Patient taking differently: PLACE 3 DROPS INTO THE LEFT EAR EVERY 8 (EIGHT) HOURS AS NEEDED 08/12/15   Mackie Pai, PA-C  omeprazole (PRILOSEC) 20 MG capsule TAKE ONE CAPSULE BY MOUTH EVERY DAY 07/20/16   Mackie Pai, PA-C  predniSONE (DELTASONE) 10 MG tablet TAKE 1 TABLET BY MOUTH EVERY DAY 08/03/16   [name], MD    psyllium (METAMUCIL) 58.6 % packet Take 1 packet by mouth daily. 12/15/15   Lavina Hamman, MD  Simethicone (GAS-X PO) Take 2 capsules by mouth daily.     Historical Provider, MD  sucralfate (CARAFATE) 1 g tablet TAKE 2 TABLETS IN THE MORNING AND AT BEDTIME 06/23/16   Ladene Artist, MD   Family History Family History  Problem Relation Age of Onset  . Diabetes Father   . Stomach cancer Sister   . Gallbladder disease Mother   . Colon cancer Neg Hx   . Colon polyps Neg Hx   . Esophageal cancer Neg Hx    Social History Social History  Substance Use Topics  . Smoking status: Never Smoker  . Smokeless tobacco: Never Used  . Alcohol use No   Allergies   Contrast media [iodinated diagnostic agents]; Fruit & vegetable daily [nutritional supplements]; Ibuprofen; Other; Augmentin [amoxicillin-pot clavulanate]; and Keflex [cephalexin]  Review of Systems Review of Systems  Constitutional: Negative for fever.  HENT: Negative for rhinorrhea and sore throat.   Eyes: Negative for redness.  Respiratory: Negative for cough.   Cardiovascular: Negative for chest pain.  Gastrointestinal: Negative for abdominal pain, diarrhea, nausea and vomiting.  Genitourinary: Negative for dysuria.  Musculoskeletal: Positive for arthralgias (left hip) and myalgias.  Skin: Negative for rash.  Neurological: Negative for numbness and headaches.       Negative for paraesthesias.    Physical Exam Updated Vital Signs BP 136/80   Pulse 94   Temp 98.6 F (37 C) (Oral)   Resp 22   Ht 4\' 11"  (1.499 m)   Wt 142 lb (64.4 kg)   SpO2 100%   BMI 28.68 kg/m   Physical Exam  Constitutional: She appears well-developed and well-nourished. No distress.  HENT:  Head: Normocephalic and atraumatic.  Eyes: Conjunctivae are normal.  Neck: Normal range of motion.  Cardiovascular: Normal rate.   Pulmonary/Chest: Effort normal.  Abdominal: She exhibits no distension.  Musculoskeletal: Normal range of motion.        Left hip: She exhibits tenderness. She exhibits normal range of motion, normal strength and no bony tenderness.       Lumbar back: Normal.  Neurological: She is alert.  Skin: No pallor.  Psychiatric: She has a normal mood and affect. Her behavior is normal.  Nursing note and vitals reviewed.  ED Treatments / Results  DIAGNOSTIC STUDIES: Oxygen Saturation is 100% on RA, normal by my interpretation.   COORDINATION OF CARE: 6:46 PM-Discussed next steps with pt including DG left hip. Pt verbalized understanding and is agreeable with the plan.   Procedures Procedures   Medications Ordered in ED Medications  morphine 2 MG/ML injection 2 mg (2 mg Intravenous Given 08/06/16 2041)  ondansetron (ZOFRAN) injection 4 mg (4 mg Intravenous Given 08/06/16 2041)  morphine 2 MG/ML injection 2 mg (2 mg Intravenous Given 08/06/16 2233)    Initial Impression / Assessment and Plan / ED Course  I have reviewed the triage vital signs and the nursing notes.  Pertinent labs & imaging results that were available during my care of the patient were reviewed by me and considered in my medical decision making (see chart for details).     Patient discussed with and seen by Dr. Sherry Ruffing. CT ordered to eval for soft tissue etiology.    This was negative. Patient has been treated with pain medication. We discussed admission versus care at home with close PCP follow-up. Son would like for patient to be discharged. Will need PTAR to assist with transport home so they can get into her 2nd story house. Pt unable to ambulate at baseline currently. Will also give bedpan.   Patient given hydrocodone for home. Instructed on using lowest dose for shortest period of time. Patient always has someone with her.  Encouraged to discuss home health with PCP.    Final Clinical Impressions(s) / ED Diagnoses   Final diagnoses:  Leg pain  Left thigh pain   Patients with left sided lower extremity pain. Plain films and CT are  negative. No signs of infection in skin or of abscess. Labs are reassuring. Urine appears to be clearing. She does not have abdominal pain and her diverticulitis symptoms appear to be improving. Family wants to take her home.   New Prescriptions New Prescriptions   HYDROCODONE-ACETAMINOPHEN (NORCO/VICODIN) 5-325 MG TABLET    Take 1/2 -1 tablets every 6 hours as needed for severe pain   I personally performed the services described in this documentation,  which was scribed in my presence. The recorded information has been reviewed and is accurate.      Carlisle Cater, PA-C 08/06/16 [place], MD 08/07/16 [phone]

## 2016-08-06 NOTE — ED Triage Notes (Addendum)
Pain in her left hip. No injury. She is being treated for a UTI. She was given Naproxen about 5 hours ago.

## 2016-08-06 NOTE — Discharge Instructions (Signed)
Please read and follow all provided instructions.  Your diagnoses today include:  1. Leg pain   2. Left thigh pain     Tests performed today include:  Vital signs - see below for your results today  Urine test - appears to be improving  Blood counts and electrolytes  Medications prescribed:   Vicodin (hydrocodone/acetaminophen) - narcotic pain medication  DO NOT drive or perform any activities that require you to be awake and alert because this medicine can make you drowsy. BE VERY CAREFUL not to take multiple medicines containing Tylenol (also called acetaminophen). Doing so can lead to an overdose which can damage your liver and cause liver failure and possibly death.  Use pain medication only under direct supervision at the lowest possible dose needed to control your pain.   Take any prescribed medications only as directed.  Home care instructions:   Follow any educational materials contained in this packet  Please rest, use ice or heat on your back for the next several days  Follow-up instructions: Please follow-up with your primary care provider as soon as possible.   Return instructions:  SEEK IMMEDIATE MEDICAL ATTENTION IF YOU HAVE:  New numbness, tingling, weakness, or problem with the use of your arms or legs  Severe back pain not relieved with medications  Loss control of your bowels or bladder  Increasing pain in any areas of the body (such as chest or abdominal pain)  Shortness of breath, dizziness, or fainting.   Worsening nausea (feeling sick to your stomach), vomiting, fever, or sweats  Any other emergent concerns regarding your health    Additional Information:  Your vital signs today were: BP 126/57 (BP Location: Left Arm)    Pulse 88    Temp 98.6 F (37 C) (Oral)    Resp 18    Ht 4\' 11"  (1.499 m)    Wt 64.4 kg    SpO2 96%    BMI 28.68 kg/m  If your blood pressure (BP) was elevated above 135/85 this visit, please have this repeated by your  doctor within one month. --------------

## 2016-08-06 NOTE — Telephone Encounter (Signed)
Stefanie Henry [id number] - son   Pt's son called in because he said that pt is now having left hip pain. He would like to know if provider would prescribe her a pain med. He said that pt was just seen the other day. I explained that the visit from the other was for a different concern and that provider would possibly have to still see her to access her first before prescribing. He said that he would like to just speak with him and ask. He said that they live on the second floor so it is difficult to bring her in.   Please advise.

## 2016-08-06 NOTE — ED Notes (Signed)
Attempted to ambulate pt. Pt unable to bear weight on leg. Pt assisted back to bed. EDP notified.

## 2016-08-07 ENCOUNTER — Telehealth: Payer: Self-pay | Admitting: Medical

## 2016-08-07 DIAGNOSIS — M79606 Pain in leg, unspecified: Secondary | ICD-10-CM | POA: Diagnosis not present

## 2016-08-07 DIAGNOSIS — R531 Weakness: Secondary | ICD-10-CM | POA: Diagnosis not present

## 2016-08-07 NOTE — Telephone Encounter (Signed)
Caller name: Maurene Capes  Relation to pt:son  Call back number: [phone]   Reason for call:  Patient son calling checking on the status of message below, son would like Rx please send to [facility].

## 2016-08-07 NOTE — Telephone Encounter (Signed)
Pt seen in ED  yesterday

## 2016-08-07 NOTE — Telephone Encounter (Signed)
Patient's son Stefanie Henry called stating that he would like to get his mother set up with a home health nurse as he was just informed by the ED doctor that she is immobile due to arthritis of the hip. Please advise  Phone: [phone]

## 2016-08-08 LAB — PATHOLOGIST SMEAR REVIEW

## 2016-08-08 NOTE — Telephone Encounter (Signed)
Pt has severe medical problems but with her descriptin of  thigh pain I need to see her for follow from ED. I need 30 minute appointment.   ED recommend  we discuss home health for her thigh pain and inablility to climb stairs. Whenever home health ordered insurance usually requires evaluation within 30 days documenting the problems for which home health is going out. Does she have medicare? They make me certify that is the case.   Also ED made mention of possible concern for hip infection so follow recommended anyway.

## 2016-08-10 NOTE — Telephone Encounter (Signed)
Please schedule patient for 30-minute ED f/u appointment per provider instructions/SLS 02/12

## 2016-08-10 NOTE — Telephone Encounter (Signed)
Patient son states patient mobility is extremely poor and would like to speak with the nurse before scheduling appointment, please advise

## 2016-08-10 NOTE — Telephone Encounter (Signed)
Please Advise

## 2016-08-11 NOTE — Telephone Encounter (Signed)
Son called back and scheduled patient with PCP for 08/12/2016 at 9:30am.

## 2016-08-11 NOTE — Telephone Encounter (Signed)
FYI

## 2016-08-11 NOTE — Telephone Encounter (Signed)
Thanks for heads up. Also see that she may need some narcotic for pain. So not option. Home health would require face to face evaluation and starting narcotic would as well.

## 2016-08-12 ENCOUNTER — Ambulatory Visit: Payer: Medicare Other | Admitting: Medical

## 2016-08-12 ENCOUNTER — Emergency Department (HOSPITAL_COMMUNITY): Payer: Medicare Other

## 2016-08-12 ENCOUNTER — Inpatient Hospital Stay (HOSPITAL_COMMUNITY)
Admission: EM | Admit: 2016-08-12 | Discharge: 2016-08-14 | DRG: 558 | Disposition: A | Discharge status: Home Health | Payer: Medicare Other | Attending: Family Medicine | Admitting: Family Medicine

## 2016-08-12 ENCOUNTER — Encounter (HOSPITAL_COMMUNITY): Payer: Self-pay | Admitting: *Deleted

## 2016-08-12 ENCOUNTER — Telehealth: Payer: Self-pay | Admitting: Medical

## 2016-08-12 DIAGNOSIS — K5900 Constipation, unspecified: Secondary | ICD-10-CM | POA: Diagnosis not present

## 2016-08-12 DIAGNOSIS — D696 Thrombocytopenia, unspecified: Secondary | ICD-10-CM | POA: Diagnosis present

## 2016-08-12 DIAGNOSIS — S73192A Other sprain of left hip, initial encounter: Secondary | ICD-10-CM | POA: Diagnosis not present

## 2016-08-12 DIAGNOSIS — N390 Urinary tract infection, site not specified: Secondary | ICD-10-CM | POA: Diagnosis present

## 2016-08-12 DIAGNOSIS — J449 Chronic obstructive pulmonary disease, unspecified: Secondary | ICD-10-CM | POA: Diagnosis present

## 2016-08-12 DIAGNOSIS — B961 Klebsiella pneumoniae [K. pneumoniae] as the cause of diseases classified elsewhere: Secondary | ICD-10-CM | POA: Diagnosis present

## 2016-08-12 DIAGNOSIS — D509 Iron deficiency anemia, unspecified: Secondary | ICD-10-CM | POA: Diagnosis present

## 2016-08-12 DIAGNOSIS — K5909 Other constipation: Secondary | ICD-10-CM | POA: Diagnosis present

## 2016-08-12 DIAGNOSIS — M24252 Disorder of ligament, left hip: Secondary | ICD-10-CM | POA: Diagnosis not present

## 2016-08-12 DIAGNOSIS — J4540 Moderate persistent asthma, uncomplicated: Secondary | ICD-10-CM | POA: Diagnosis present

## 2016-08-12 DIAGNOSIS — M25552 Pain in left hip: Secondary | ICD-10-CM

## 2016-08-12 DIAGNOSIS — R52 Pain, unspecified: Secondary | ICD-10-CM

## 2016-08-12 DIAGNOSIS — Z880 Allergy status to penicillin: Secondary | ICD-10-CM

## 2016-08-12 DIAGNOSIS — R2689 Other abnormalities of gait and mobility: Secondary | ICD-10-CM | POA: Diagnosis present

## 2016-08-12 DIAGNOSIS — S71012A Laceration without foreign body, left hip, initial encounter: Secondary | ICD-10-CM | POA: Diagnosis not present

## 2016-08-12 DIAGNOSIS — S76012A Strain of muscle, fascia and tendon of left hip, initial encounter: Secondary | ICD-10-CM | POA: Diagnosis not present

## 2016-08-12 DIAGNOSIS — K219 Gastro-esophageal reflux disease without esophagitis: Secondary | ICD-10-CM | POA: Diagnosis present

## 2016-08-12 DIAGNOSIS — E871 Hypo-osmolality and hyponatremia: Secondary | ICD-10-CM | POA: Diagnosis not present

## 2016-08-12 DIAGNOSIS — C9110 Chronic lymphocytic leukemia of B-cell type not having achieved remission: Secondary | ICD-10-CM | POA: Diagnosis present

## 2016-08-12 DIAGNOSIS — M25559 Pain in unspecified hip: Secondary | ICD-10-CM

## 2016-08-12 DIAGNOSIS — Z7952 Long term (current) use of systemic steroids: Secondary | ICD-10-CM

## 2016-08-12 DIAGNOSIS — Z7951 Long term (current) use of inhaled steroids: Secondary | ICD-10-CM

## 2016-08-12 DIAGNOSIS — Z79899 Other long term (current) drug therapy: Secondary | ICD-10-CM

## 2016-08-12 HISTORY — DX: Other sprain of left hip, initial encounter: S73.192A

## 2016-08-12 HISTORY — DX: Other abnormalities of gait and mobility: R26.89

## 2016-08-12 LAB — URINALYSIS, ROUTINE W REFLEX MICROSCOPIC
BILIRUBIN URINE: NEGATIVE
GLUCOSE, UA: NEGATIVE mg/dL
Ketones, ur: NEGATIVE mg/dL
NITRITE: NEGATIVE
PH: 6 (ref 5.0–8.0)
Protein, ur: NEGATIVE mg/dL
SPECIFIC GRAVITY, URINE: 1.005 (ref 1.005–1.030)

## 2016-08-12 LAB — BASIC METABOLIC PANEL
Anion gap: 7 (ref 5–15)
BUN: 10 mg/dL (ref 6–20)
CO2: 26 mmol/L (ref 22–32)
Calcium: 8.4 mg/dL — ABNORMAL LOW (ref 8.9–10.3)
Chloride: 98 mmol/L — ABNORMAL LOW (ref 101–111)
Creatinine, Ser: 0.65 mg/dL (ref 0.44–1.00)
GFR calc Af Amer: >60 mL/min (ref >60)
GFR calc non Af Amer: >60 mL/min (ref >60)
Glucose, Bld: 121 mg/dL — ABNORMAL HIGH (ref 65–99)
Potassium: 4.5 mmol/L (ref 3.5–5.1)
Sodium: 131 mmol/L — ABNORMAL LOW (ref 135–145)

## 2016-08-12 LAB — CBC WITH DIFFERENTIAL/PLATELET
Basophils Absolute: 0 10*3/uL (ref 0.0–0.1)
Basophils Relative: 0 %
EOS PCT: 0 %
Eosinophils Absolute: 0 10*3/uL (ref 0.0–0.7)
HEMATOCRIT: 33.9 % — AB (ref 36.0–46.0)
Hemoglobin: 10.7 g/dL — ABNORMAL LOW (ref 12.0–15.0)
LYMPHS ABS: 62.9 10*3/uL — AB (ref 0.7–4.0)
Lymphocytes Relative: 91 %
MCH: 22.2 pg — ABNORMAL LOW (ref 26.0–34.0)
MCHC: 31.6 g/dL (ref 30.0–36.0)
MCV: 70.2 fL — AB (ref 78.0–100.0)
MONOS PCT: 1 %
Monocytes Absolute: 0.7 10*3/uL (ref 0.1–1.0)
NEUTROS ABS: 5.5 10*3/uL (ref 1.7–7.7)
Neutrophils Relative %: 8 %
Platelets: 146 10*3/uL — ABNORMAL LOW (ref 150–400)
RBC: 4.83 MIL/uL (ref 3.87–5.11)
RDW: 14 % (ref 11.5–15.5)
WBC: 69.1 10*3/uL (ref 4.0–10.5)

## 2016-08-12 LAB — SEDIMENTATION RATE: Sed Rate: 4 mm/hr (ref 0–22)

## 2016-08-12 LAB — CK: Total CK: 31 U/L — ABNORMAL LOW (ref 38–234)

## 2016-08-12 MED ORDER — CALCITONIN (SALMON) 200 UNIT/ACT NA SOLN
1.0000 | Freq: Every day | NASAL | Status: DC
  Administered 2016-08-13 – 2016-08-14 (×2): 1 via NASAL
  Filled 2016-08-12: qty 3.7

## 2016-08-12 MED ORDER — ADULT MULTIVITAMIN W/MINERALS CH
1.0000 | ORAL_TABLET | Freq: Every day | ORAL | Status: DC
  Administered 2016-08-13 – 2016-08-14 (×2): 1 via ORAL
  Filled 2016-08-12 (×2): qty 1

## 2016-08-12 MED ORDER — DOCUSATE SODIUM 100 MG PO CAPS
100.0000 mg | ORAL_CAPSULE | Freq: Two times a day (BID) | ORAL | Status: DC
  Administered 2016-08-13 – 2016-08-14 (×4): 100 mg via ORAL
  Filled 2016-08-12 (×4): qty 1

## 2016-08-12 MED ORDER — ALBUTEROL SULFATE (2.5 MG/3ML) 0.083% IN NEBU: 2.5000 mg | INHALATION_SOLUTION | Freq: Four times a day (QID) | RESPIRATORY_TRACT | Status: DC | PRN

## 2016-08-12 MED ORDER — SODIUM CHLORIDE 0.9% FLUSH
3.0000 mL | Freq: Two times a day (BID) | INTRAVENOUS | Status: DC
  Administered 2016-08-13: 10:00:00 3 mL via INTRAVENOUS

## 2016-08-12 MED ORDER — MAGNESIUM CITRATE PO SOLN
1.0000 | Freq: Once | ORAL | Status: AC
  Administered 2016-08-13: 1 via ORAL
  Filled 2016-08-12: qty 296

## 2016-08-12 MED ORDER — ONDANSETRON HCL 4 MG PO TABS
4.0000 mg | ORAL_TABLET | Freq: Four times a day (QID) | ORAL | Status: DC | PRN
  Administered 2016-08-13: 4 mg via ORAL
  Filled 2016-08-12: qty 1

## 2016-08-12 MED ORDER — ONDANSETRON HCL 4 MG/2ML IJ SOLN
4.0000 mg | Freq: Four times a day (QID) | INTRAMUSCULAR | Status: DC | PRN
  Administered 2016-08-13: 4 mg via INTRAVENOUS

## 2016-08-12 MED ORDER — OXYCODONE-ACETAMINOPHEN 5-325 MG PO TABS: 1.0000 | ORAL_TABLET | Freq: Once | ORAL | Status: DC

## 2016-08-12 MED ORDER — POLYETHYLENE GLYCOL 3350 17 G PO PACK: 17.0000 g | PACK | Freq: Every day | ORAL | Status: DC | PRN

## 2016-08-12 MED ORDER — NEOMYCIN-POLYMYXIN-HC 1 % OT SOLN: 3.0000 [drp] | Freq: Three times a day (TID) | OTIC | Status: DC

## 2016-08-12 MED ORDER — SODIUM CHLORIDE 0.9 % IV SOLN: 250.0000 mL | INTRAVENOUS | Status: DC | PRN

## 2016-08-12 MED ORDER — SUCRALFATE 1 G PO TABS
1.0000 g | ORAL_TABLET | Freq: Two times a day (BID) | ORAL | Status: DC
  Administered 2016-08-13 – 2016-08-14 (×4): 1 g via ORAL
  Filled 2016-08-12 (×4): qty 1

## 2016-08-12 MED ORDER — FLEET ENEMA 7-19 GM/118ML RE ENEM: 1.0000 | ENEMA | Freq: Once | RECTAL | Status: DC | PRN

## 2016-08-12 MED ORDER — POLYETHYLENE GLYCOL 3350 17 G PO PACK
17.0000 g | PACK | Freq: Every day | ORAL | Status: DC
  Administered 2016-08-13 – 2016-08-14 (×2): 17 g via ORAL
  Filled 2016-08-12 (×2): qty 1

## 2016-08-12 MED ORDER — ACETAMINOPHEN 650 MG RE SUPP: 650.0000 mg | Freq: Four times a day (QID) | RECTAL | Status: DC | PRN

## 2016-08-12 MED ORDER — PREDNISONE 20 MG PO TABS
10.0000 mg | ORAL_TABLET | Freq: Every day | ORAL | Status: DC
Start: 2016-08-13 — End: 2016-08-14
  Administered 2016-08-13 – 2016-08-14 (×2): 10 mg via ORAL
  Filled 2016-08-12 (×2): qty 1

## 2016-08-12 MED ORDER — PANTOPRAZOLE SODIUM 40 MG PO TBEC
40.0000 mg | DELAYED_RELEASE_TABLET | Freq: Every day | ORAL | Status: DC
  Administered 2016-08-13 – 2016-08-14 (×2): 40 mg via ORAL
  Filled 2016-08-12 (×2): qty 1

## 2016-08-12 MED ORDER — CYANOCOBALAMIN 500 MCG PO TABS
500.0000 ug | ORAL_TABLET | Freq: Every day | ORAL | Status: DC
  Administered 2016-08-13 – 2016-08-14 (×2): 500 ug via ORAL
  Filled 2016-08-12 (×2): qty 1

## 2016-08-12 MED ORDER — HYDROCODONE-ACETAMINOPHEN 5-325 MG PO TABS
1.0000 | ORAL_TABLET | ORAL | Status: DC | PRN
  Administered 2016-08-13 (×2): 2 via ORAL
  Administered 2016-08-13 – 2016-08-14 (×2): 1 via ORAL
  Filled 2016-08-12: qty 2
  Filled 2016-08-12: qty 1
  Filled 2016-08-12: qty 2
  Filled 2016-08-12: qty 1

## 2016-08-12 MED ORDER — HYDROCORTISONE ACETATE 25 MG RE SUPP
25.0000 mg | Freq: Two times a day (BID) | RECTAL | Status: DC | PRN
  Filled 2016-08-12: qty 1

## 2016-08-12 MED ORDER — GABAPENTIN 100 MG PO CAPS: 200.0000 mg | ORAL_CAPSULE | Freq: Three times a day (TID) | ORAL | Status: DC | PRN

## 2016-08-12 MED ORDER — MOMETASONE FURO-FORMOTEROL FUM 200-5 MCG/ACT IN AERO
2.0000 | INHALATION_SPRAY | Freq: Two times a day (BID) | RESPIRATORY_TRACT | Status: DC
  Administered 2016-08-13 – 2016-08-14 (×3): 2 via RESPIRATORY_TRACT
  Filled 2016-08-12: qty 8.8

## 2016-08-12 MED ORDER — SODIUM CHLORIDE 0.9% FLUSH: 3.0000 mL | INTRAVENOUS | Status: DC | PRN

## 2016-08-12 MED ORDER — ACETAMINOPHEN 325 MG PO TABS
650.0000 mg | ORAL_TABLET | Freq: Four times a day (QID) | ORAL | Status: DC | PRN
  Filled 2016-08-12: qty 2

## 2016-08-12 NOTE — Telephone Encounter (Signed)
Notify son and explain will try to do this asap.  But if condition worsens then ED evaluation.

## 2016-08-12 NOTE — H&P (Signed)
History and Physical    Stefanie Henry [id number] DOB: 29-Dec-1929 DOA: 08/12/2016  PCP: Mackie Pai, PA-C   Patient coming from: Home  Chief Complaint: Severe left hip pain, unable to bear weight  HPI: Stefanie Henry is a 83 y.o. female with medical history significant for CLL managed with daily prednisone and observation, moderate persistent asthma, iron deficiency anemia, GERD, and chronic constipation presenting to the emergency department for evaluation of severe left hip pain and inability to bear weight. Patient reports that she had been in her usual state of health until approximately 2 weeks ago when she noted the insidious development of pain in the left hip. There has been no fall, trauma, or inciting event identified. This has progressively worsened and has now become severe. Pain is described as severe, sharp, localized to the left hip, worse with any movement or weightbearing, and better with rest and offloading. Patient was evaluated in the emergency department on 08/06/2016 for these complaints with negative radiographs and negative CT. Patient was provided oral analgesics and discharged home. She was to follow-up with her primary care physician today with regards to this pain, but the symptoms were so severe that she was diverted to the emergency department.  ED Course: Upon arrival to the ED, patient is found to be afebrile, saturating well on room air, and with vital signs otherwise stable. Chemistry panel is notable for stable chronic hyponatremia and CBC features a WBC of 69,100, hemoglobin of 10.7, MCV 70.2, and platelets 146,000. Smudge cells are noted on the peripheral smear. Sedimentation rate is normal at 4 and CK is only 31. MRI of the left hip was obtained and negative for acute bony abnormality, but with findings suggestive of a labral tear. Also noted on the MRI is osteoarthritic changes to bilateral hips and a large volume of retained stool in the rectal vault with  mild surrounding inflammation. Patient was given 1 dose of Percocet 5 mg and orthopedic surgery was consulted by the ED physician. Orthopedic consultant advised a medical admission in order to consult interventional radiology for intra-articular steroid injection. She has remained hemodynamically stable and in no apparent respiratory distress and will be observed on the medical surgical unit for ongoing evaluation and management of severe left hip pain with inability to bear weight and imaging findings suggestive of possible labral tear.  Review of Systems:  All other systems reviewed and apart from HPI, are negative.  Past Medical History:  Diagnosis Date  . Anemia, iron deficiency 06/06/2012  . Arthritis   . Asthma   . Blood transfusion without reported diagnosis   . Cataract   . CHF (congestive heart failure) ([place]) 12/20/2015  . Chronic lymphatic leukemia ([place])   . COPD (chronic obstructive pulmonary disease) ([place])   . Diverticulitis   . Gallstones   . GERD (gastroesophageal reflux disease)   . Neuromuscular disorder ([place])   . Shingles     Past Surgical History:  Procedure Laterality Date  . ESOPHAGOGASTRODUODENOSCOPY N/A 11/07/2014   Procedure: ESOPHAGOGASTRODUODENOSCOPY (EGD);  Surgeon: Gatha Mayer, MD;  Location: [facility];  Service: Endoscopy;  Laterality: N/A;  . none       reports that she has never smoked. She has never used smokeless tobacco. She reports that she does not drink alcohol or use drugs.  Allergies  Allergen Reactions  . Citrus Other (See Comments)    Stomach discomfort  . Other     Cannot take any antibiotics, causes rash  . Augmentin [Amoxicillin-Pot Clavulanate]  Swelling and Rash  . Ibuprofen Other (See Comments)    Stomach discomfort  . Ivp Dye [Iodinated Diagnostic Agents] Other (See Comments)    Stomach discomfort  . Keflex [Cephalexin] Swelling and Rash    Family History  Problem Relation Age of Onset  . Diabetes Father   . Stomach  cancer Sister   . Gallbladder disease Mother   . Colon cancer Neg Hx   . Colon polyps Neg Hx   . Esophageal cancer Neg Hx      Prior to Admission medications   Medication Sig Start Date End Date Taking? Authorizing Provider  albuterol (PROVENTIL) (2.5 MG/3ML) 0.083% nebulizer solution Take 3 mLs (2.5 mg total) by nebulization 2 (two) times daily. 12/02/15  Yes Tammy S Parrett, NP  albuterol (VENTOLIN HFA) 108 (90 Base) MCG/ACT inhaler INHALE 1-2 PUFFS INTO THE LUNGS EVERY 6 HOURS AS NEEDED FOR WHEEZING OR SHORTNESS OF BREATH. Patient taking differently: Inhale 1-2 puffs into the lungs every 6 (six) hours as needed for wheezing or shortness of breath.  12/02/15  Yes Tammy S Parrett, NP  Ascorbic Acid (VITAMIN C) 1000 MG tablet Take 1,000 mg by mouth daily.   Yes Historical Provider, MD  calcitonin, salmon, (MIACALCIN/FORTICAL) 200 UNIT/ACT nasal spray Place 1 spray into alternate nostrils daily. 11/18/15  Yes Edward Saguier, PA-C  gabapentin (NEURONTIN) 100 MG capsule TAKE 2 CAPSULES BY MOUTH 3 TIMES A DAY AS NEEDED FOR NERVE PAIN 07/13/16  Yes Edward Saguier, PA-C  GARLIC PO Take 1 tablet by mouth daily.   Yes Historical Provider, MD  HYDROcodone-acetaminophen (NORCO/VICODIN) 5-325 MG tablet Take 1/2 -1 tablets every 6 hours as needed for severe pain 08/06/16  Yes Carlisle Cater, PA-C  hydrocortisone (ANUSOL-HC) 25 MG suppository Place 1 suppository (25 mg total) rectally 2 (two) times daily. Patient taking differently: Place 25 mg rectally 2 (two) times daily as needed for hemorrhoids.  12/15/15  Yes Lavina Hamman, MD  hyoscyamine (LEVSIN SL) 0.125 MG SL tablet PLACE 1 TABLET (0.125 MG TOTAL) UNDER THE TONGUE EVERY 8 (EIGHT) HOURS AS NEEDED. Patient taking differently: Take 0.125 mg by mouth every 8 (eight) hours as needed for cramping. . 11/18/15  Yes Edward Saguier, PA-C  mometasone-formoterol (DULERA) 200-5 MCG/ACT AERO Inhale 2 puffs into the lungs 2 (two) times daily. 07/13/16  Yes Rigoberto Noel, MD   Multiple Vitamin (MULTIVITAMIN WITH MINERALS) TABS tablet Take 1 tablet by mouth daily.   Yes Historical Provider, MD  NEOMYCIN-POLYMYXIN-HYDROCORTISONE (CORTISPORIN) 1 % SOLN otic solution PLACE 3 DROPS INTO THE LEFT EAR EVERY 8 (EIGHT) HOURS. 08/12/15  Yes Edward Saguier, PA-C  omeprazole (PRILOSEC) 20 MG capsule TAKE ONE CAPSULE BY MOUTH EVERY DAY 07/20/16  Yes Edward Saguier, PA-C  predniSONE (DELTASONE) 10 MG tablet TAKE 1 TABLET BY MOUTH EVERY DAY 08/03/16  Yes [name], MD  psyllium (METAMUCIL) 58.6 % packet Take 1 packet by mouth daily. 12/15/15  Yes Lavina Hamman, MD  Simethicone (GAS-X PO) Take 2 capsules by mouth daily as needed (for gas).    Yes Historical Provider, MD  sucralfate (CARAFATE) 1 g tablet TAKE 2 TABLETS IN THE MORNING AND AT BEDTIME 06/23/16  Yes Ladene Artist, MD  vitamin B-12 (CYANOCOBALAMIN) 500 MCG tablet Take 500 mcg by mouth daily.   Yes Historical Provider, MD    Physical Exam: Vitals:   08/12/16 1435 08/12/16 1746 08/12/16 1930 08/12/16 2139  BP: 133/72 137/66 122/70 142/79  Pulse: 109 101 95 93  Resp: 18 18  19  Temp: 98.2 F (36.8 C)     TempSrc: Oral     SpO2: 95% 96% 96% 93%      Constitutional: NAD, calm, comfortable, cushingoid appearance Eyes: PERTLA, lids and conjunctivae normal ENMT: Mucous membranes are moist. Posterior pharynx clear of any exudate or lesions.   Neck: normal, supple, no masses, no thyromegaly Respiratory: clear to auscultation bilaterally, no wheezing, no crackles. Normal respiratory effort.   Cardiovascular: S1 & S2 heard, regular rate and rhythm. No extremity edema. JVP not well-visualized.  Abdomen: Soft, mild distension, mild tenderness in LLQ, no masses palpated, no peritoneal signs. Bowel sounds active.  Musculoskeletal: no clubbing / cyanosis. No joint deformity upper and lower extremities. Pain with palpation and AROM of left hip; pain intolerable with wt-bearing. Good distal cap refill and sensation to light  touch.  Skin: no significant rashes, lesions, ulcers. Warm, dry, well-perfused. Neurologic: CN 2-12 grossly intact. Sensation intact, DTR normal. Strength 5/5 in all 4 limbs.  Psychiatric: Normal judgment and insight. Alert and oriented x 3. Normal mood and affect.     Labs on Admission: I have personally reviewed following labs and imaging studies  CBC:  Recent Labs Lab 08/06/16 2010 08/12/16 1901  WBC 43.1* 69.1*  NEUTROABS 2.6 5.5  HGB 10.3* 10.7*  HCT 32.1* 33.9*  MCV 70.1* 70.2*  PLT 159 123456*   Basic Metabolic Panel:  Recent Labs Lab 08/06/16 2010 08/12/16 1901  NA 134* 131*  K 3.6 4.5  CL 100* 98*  CO2 25 26  GLUCOSE 108* 121*  BUN 8 10  CREATININE 0.60 0.65  CALCIUM 8.3* 8.4*   GFR: Estimated Creatinine Clearance: 42 mL/min (by C-G formula based on SCr of 0.65 mg/dL). Liver Function Tests: No results for input(s): AST, ALT, ALKPHOS, BILITOT, PROT, ALBUMIN in the last 168 hours. No results for input(s): LIPASE, AMYLASE in the last 168 hours. No results for input(s): AMMONIA in the last 168 hours. Coagulation Profile: No results for input(s): INR, PROTIME in the last 168 hours. Cardiac Enzymes:  Recent Labs Lab 08/12/16 1901  CKTOTAL 31*   BNP (last 3 results)  Recent Labs  12/18/15 1446 04/28/16 1532  PROBNP 307.0* 107.0*   HbA1C: No results for input(s): HGBA1C in the last 72 hours. CBG: No results for input(s): GLUCAP in the last 168 hours. Lipid Profile: No results for input(s): CHOL, HDL, LDLCALC, TRIG, CHOLHDL, LDLDIRECT in the last 72 hours. Thyroid Function Tests: No results for input(s): TSH, T4TOTAL, FREET4, T3FREE, THYROIDAB in the last 72 hours. Anemia Panel: No results for input(s): VITAMINB12, FOLATE, FERRITIN, TIBC, IRON, RETICCTPCT in the last 72 hours. Urine analysis:    Component Value Date/Time   COLORURINE AMBER (A) 08/12/2016 2137   APPEARANCEUR CLOUDY (A) 08/12/2016 2137   LABSPEC 1.005 08/12/2016 2137   PHURINE  6.0 08/12/2016 2137   GLUCOSEU NEGATIVE 08/12/2016 2137   HGBUR LARGE (A) 08/12/2016 2137   BILIRUBINUR NEGATIVE 08/12/2016 2137   BILIRUBINUR neg 11/18/2015 1539   KETONESUR NEGATIVE 08/12/2016 2137   PROTEINUR NEGATIVE 08/12/2016 2137   UROBILINOGEN 0.2 11/18/2015 1539   UROBILINOGEN 0.2 02/12/2015 1116   NITRITE NEGATIVE 08/12/2016 2137   LEUKOCYTESUR LARGE (A) 08/12/2016 2137   Sepsis Labs: @LABRCNTIP (procalcitonin:4,lacticidven:4) )No results found for this or any previous visit (from the past 240 hour(s)).   Radiological Exams on Admission: Mr Hip Left Wo Contrast  Result Date: 08/12/2016 CLINICAL DATA:  Left hip pain EXAM: MR OF THE LEFT HIP WITHOUT CONTRAST TECHNIQUE: Multiplanar, multisequence MR  imaging was performed. No intravenous contrast was administered. COMPARISON:  08/06/2016 CT FINDINGS: Bones: The bony sacrum, SI joints, pelvis and hips demonstrate no marrow edema or acute fracture. No bone destruction is identified. No dislocation of either femoral head. The femoral heads are spherical in appearance without flattening. Old left inferior pubic ramus fracture. Articular cartilage and labrum Articular cartilage: No focal chondral defects. Slight chondral thinning noted bilaterally suggesting degenerate change. Labrum: Small anterior sublabral recess versus tear, series 7, image 9, suboptimally assessed due to lack of intra-articular fluid. This can be further correlated with MR arthrography. A tiny paralabral cyst is noted posteriorly on series 6, image 21 which may infer a labral tear but given lack of joint distention, this is inadequately assessed and may also benefit from MR arthrography. Joint or bursal effusion Joint effusion:  No bursal fluid collections.  Effusion or fluid. Bursae:  No abnormal bursal fluid collections. Muscles and tendons Muscles and tendons:  Mild gluteal muscle atrophy bilaterally. Other findings Miscellaneous: Calcified uterine fibroid. Large volume of  stool in the rectal vault with mild perirectal edema may represent stercoral colitis. IMPRESSION: No acute osseous abnormality of the bony pelvis and hips. Osteoarthritis both hips. Tiny posterior paralabral cyst name for a labral tear though not as discretely identified due to lack of joint distention with fluid. MR arthrography may help for further characterization and correlation. Additionally there is either a sublabral recess or anterior labral tear which could also be further correlated with MR arthrography. Fibroid uterus. Large volume of retained stool in the rectal vault with associated mild inflammation suggesting stercoral colitis. Electronically Signed   By: Ashley Royalty M.D.   On: 08/12/2016 21:46    EKG: Not performed, will obtain as appropriate.   Assessment/Plan  1. Severe left hip pain, ?labral tear  - Pt presents with severe left hip pain, worsening over past 2 wks with fall, trauma, or identifiable inciting event  - Plain radiographs and CT negative for fracture in ED on 08/06/16 and she was discharged home - Pain has continued to worsen and she is unable to bear wt; requiring maximal assistance from son to get to toilet; at risk for falls; lives on 2nd floor  - MRI was obtained in ED with findings suggestive of labral tear - Orthopedic surgery consulted by ED physician, advises medical admission for intraarticular steroid injection by IR - IR consultation requested, PT eval requested, continue prn analgesia    2. Acute on chronic constipation  - Incidentally noted on MRI of hip is large vol retained stool in rectum with mild associated colitis  - Will treat with magnesium citrate now, then MiraLax daily and prn enema    3. CLL - Followed by oncology and managed with daily prednisone 10 mg  - She has refused chemotherapy per oncology notes  - WBC 69k on admission, up from mid-40's last month, but improved from 83,500 in November 2017  - Seems to be asymptomatic on admission  -  Continue daily prednisone    4. Microcytic anemia, thrombocytopenia  - Hgb 10.7 on admission with MCV 70.2; this is stable relative to recent priors, managed by hematology/oncology with iron infusions, and with no active blood-loss apparent - Platelets are very mildly reduced on admission to 146,000 without bleeding; has been intermittently low in past    5. Asthma, moderate-persistent  - Stable on admission with no wheezing or cough - Continue scheduled Dulera and prn albuterol    6. Hyponatremia, chronic - Serum  sodium is 131 on admission  - This is stable relative to priors and she appears to be euvolemic    DVT prophylaxis: SCD's Code Status: Full  Family Communication: Son updated at bedside Disposition Plan: Observe on med-surg Consults called: Orthopedic surgery Admission status: Observation    Vianne Bulls, MD [facility] Pager [phone]  If 7PM-7AM, please contact night-coverage [email or web address] Password [facility]  08/12/2016, 10:49 PM

## 2016-08-12 NOTE — Telephone Encounter (Signed)
Son called back stating paramedics will assist patient to car so patient can be seen by PCP, patient scheduled for 3:45 today (30 minute slot).

## 2016-08-12 NOTE — Telephone Encounter (Addendum)
Caller name:Botto,Anil Relation to pt:son  Call back number: [phone]    Reason for call:  Son called this morning stating that he can't move he's mother due to the pain she's experiencing and would like to speak with the nurse, please advise

## 2016-08-12 NOTE — Telephone Encounter (Signed)
Patient was just taken to [facility] ER because she was in severe pain. Patient was scheduled for 3:45 this afternoon.

## 2016-08-12 NOTE — ED Notes (Signed)
Bed: [id number] Expected date:  Expected time:  Means of arrival:  Comments: 83 yo chest pain

## 2016-08-12 NOTE — ED Triage Notes (Addendum)
Per EMS, pt complains of left hip pain x 1 week. Pt was seen and discharged with pain medication. Pt was to see doctor this morning, states pain was too bad. Pt's husband wanted pt to be evaluated, states he does not trust PCP and would like pt to be admitted.

## 2016-08-12 NOTE — Telephone Encounter (Signed)
Contacted [facility], they will contact family regarding referral

## 2016-08-12 NOTE — Telephone Encounter (Signed)
Placed referral to palliative care. Please see referral.

## 2016-08-12 NOTE — ED Provider Notes (Signed)
[facility] DEPT Provider Note   CSN: [id number] Arrival date & time: 08/12/16  1430     History   Chief Complaint Chief Complaint  Patient presents with  . Hip Pain    HPI Stefanie Henry is a 83 y.o. female.  HPI   Pt with hx CLL on chronic prednisone, anemia, CHF, COPD, neuromuscular disorder p/w left hip pain.  Pain began approximately 2 weeks ago, was mild initially and since has become so severe she is unable to walk.  She was seen in the ED 08/06/16 with negative CT femur, d/c home with hydrocodone.  Son reports patient has been lying in bed, she has severe pain with attempting to bear weight.  With vicodin 5-325 she can get to the bedside commode and move around a little bit for only 2-3 hours only.  Pain began around the time she was diagnosed with diverticulitis, was treated initially with augmentin and changed to cipro/flagyl following allergic reaction.  Pt and son deny any fevers, injury, other recent illness, leg swelling, recent travel.  No currently urinary or bowel symptoms.  No abdominal pain.   Past Medical History:  Diagnosis Date  . Anemia, iron deficiency 06/06/2012  . Arthritis   . Asthma   . Blood transfusion without reported diagnosis   . Cataract   . CHF (congestive heart failure) ([place]) 12/20/2015  . Chronic lymphatic leukemia ([place])   . COPD (chronic obstructive pulmonary disease) ([place])   . Diverticulitis   . Gallstones   . GERD (gastroesophageal reflux disease)   . Neuromuscular disorder ([place])   . Shingles     Patient Active Problem List   Diagnosis Date Noted  . Labral tear of left hip joint 08/12/2016  . Unable to bear weight 08/12/2016  . Dependent edema 12/18/2015  . UTI (urinary tract infection) 12/13/2015  . Chronic respiratory failure ([place]) 12/02/2015  . Asthma, moderate persistent 08/27/2015  . Diverticulitis of colon 12/04/2014  . Abscess   . Colovesical fistula   . Early satiety   . Gastric polyps   . Diverticulitis of large  intestine with abscess without bleeding   . Malnutrition of moderate degree ([place]) 11/02/2014  . Abnormal LFTs (liver function tests)   . Dysphagia   . Abscess, abdomen ([place]) 10/16/2014  . Hyponatremia 10/16/2014  . Diverticulitis of large intestine with perforation and abscess without bleeding 10/16/2014  . Thrombocytopenia ([place]) 10/16/2014  . Dyspnea 10/05/2014  . Midline low back pain without sciatica   . Back pain 06/09/2014  . Fall   . CLL (chronic lymphocytic leukemia) ([place])   . Gallstones without obstruction of gallbladder   . Lumbar compression fracture ([place]) 06/08/2014  . Chronic lymphocytic leukemia ([place]) 06/06/2012  . Anemia, iron deficiency 06/06/2012    Past Surgical History:  Procedure Laterality Date  . ESOPHAGOGASTRODUODENOSCOPY N/A 11/07/2014   Procedure: ESOPHAGOGASTRODUODENOSCOPY (EGD);  Surgeon: Gatha Mayer, MD;  Location: [facility];  Service: Endoscopy;  Laterality: N/A;  . none      OB History    No data available       Home Medications    Prior to Admission medications   Medication Sig Start Date End Date Taking? Authorizing Provider  albuterol (PROVENTIL) (2.5 MG/3ML) 0.083% nebulizer solution Take 3 mLs (2.5 mg total) by nebulization 2 (two) times daily. 12/02/15  Yes Tammy S Parrett, NP  albuterol (VENTOLIN HFA) 108 (90 Base) MCG/ACT inhaler INHALE 1-2 PUFFS INTO THE LUNGS EVERY 6 HOURS AS NEEDED FOR WHEEZING OR SHORTNESS OF  BREATH. Patient taking differently: Inhale 1-2 puffs into the lungs every 6 (six) hours as needed for wheezing or shortness of breath.  12/02/15  Yes Tammy S Parrett, NP  Ascorbic Acid (VITAMIN C) 1000 MG tablet Take 1,000 mg by mouth daily.   Yes Historical Provider, MD  calcitonin, salmon, (MIACALCIN/FORTICAL) 200 UNIT/ACT nasal spray Place 1 spray into alternate nostrils daily. 11/18/15  Yes Edward Saguier, PA-C  ciprofloxacin (CIPRO) 500 MG tablet Take 1 tablet (500 mg total) by mouth 2 (two) times daily. One po bid x 7  days Patient taking differently: Take 500 mg by mouth 2 (two) times daily. Started 02/03 for 7 days 07/31/16  Yes Malvin Johns, MD  gabapentin (NEURONTIN) 100 MG capsule TAKE 2 CAPSULES BY MOUTH 3 TIMES A DAY AS NEEDED FOR NERVE PAIN 07/13/16  Yes Percell Miller Saguier, PA-C  GARLIC PO Take 1 tablet by mouth daily.   Yes Historical Provider, MD  HYDROcodone-acetaminophen (NORCO/VICODIN) 5-325 MG tablet Take 1/2 -1 tablets every 6 hours as needed for severe pain 08/06/16  Yes Carlisle Cater, PA-C  hydrocortisone (ANUSOL-HC) 25 MG suppository Place 1 suppository (25 mg total) rectally 2 (two) times daily. Patient taking differently: Place 25 mg rectally 2 (two) times daily as needed for hemorrhoids.  12/15/15  Yes Lavina Hamman, MD  hyoscyamine (LEVSIN SL) 0.125 MG SL tablet PLACE 1 TABLET (0.125 MG TOTAL) UNDER THE TONGUE EVERY 8 (EIGHT) HOURS AS NEEDED. Patient taking differently: Take 0.125 mg by mouth every 8 (eight) hours as needed for cramping. . 11/18/15  Yes Edward Saguier, PA-C  mometasone-formoterol (DULERA) 200-5 MCG/ACT AERO Inhale 2 puffs into the lungs 2 (two) times daily. 07/13/16  Yes Rigoberto Noel, MD  Multiple Vitamin (MULTIVITAMIN WITH MINERALS) TABS tablet Take 1 tablet by mouth daily.   Yes Historical Provider, MD  NEOMYCIN-POLYMYXIN-HYDROCORTISONE (CORTISPORIN) 1 % SOLN otic solution PLACE 3 DROPS INTO THE LEFT EAR EVERY 8 (EIGHT) HOURS. 08/12/15  Yes Edward Saguier, PA-C  omeprazole (PRILOSEC) 20 MG capsule TAKE ONE CAPSULE BY MOUTH EVERY DAY 07/20/16  Yes Edward Saguier, PA-C  predniSONE (DELTASONE) 10 MG tablet TAKE 1 TABLET BY MOUTH EVERY DAY 08/03/16  Yes [name], MD  psyllium (METAMUCIL) 58.6 % packet Take 1 packet by mouth daily. 12/15/15  Yes Lavina Hamman, MD  Simethicone (GAS-X PO) Take 2 capsules by mouth daily as needed (for gas).    Yes Historical Provider, MD  sucralfate (CARAFATE) 1 g tablet TAKE 2 TABLETS IN THE MORNING AND AT BEDTIME 06/23/16  Yes Ladene Artist, MD   vitamin B-12 (CYANOCOBALAMIN) 500 MCG tablet Take 500 mcg by mouth daily.   Yes Historical Provider, MD  albuterol (PROVENTIL) (2.5 MG/3ML) 0.083% nebulizer solution USE 1 VIAL VIA NEBULIZER TWICE A DAY (DX J45.40) Patient not taking: Reported on 08/12/2016 08/04/16   Rigoberto Noel, MD  feeding supplement, ENSURE ENLIVE, (ENSURE ENLIVE) LIQD Take 237 mLs by mouth daily. Patient not taking: Reported on 08/12/2016 12/15/15   Lavina Hamman, MD  metroNIDAZOLE (FLAGYL) 500 MG tablet Take 1 tablet (500 mg total) by mouth 3 (three) times daily. One po bid x 7 days Patient not taking: Reported on 08/12/2016 07/31/16   Malvin Johns, MD    Family History Family History  Problem Relation Age of Onset  . Diabetes Father   . Stomach cancer Sister   . Gallbladder disease Mother   . Colon cancer Neg Hx   . Colon polyps Neg Hx   . Esophageal  cancer Neg Hx     Social History Social History  Substance Use Topics  . Smoking status: Never Smoker  . Smokeless tobacco: Never Used  . Alcohol use No     Allergies   Citrus; Other; Augmentin [amoxicillin-pot clavulanate]; Ibuprofen; Ivp dye [iodinated diagnostic agents]; and Keflex [cephalexin]   Review of Systems Review of Systems  All other systems reviewed and are negative.    Physical Exam Updated Vital Signs BP 142/79 (BP Location: Left Arm)   Pulse 93   Temp 98.2 F (36.8 C) (Oral)   Resp 19   SpO2 93%   Physical Exam  Constitutional: She appears well-developed and well-nourished. No distress.  HENT:  Head: Normocephalic and atraumatic.  Neck: Neck supple.  Cardiovascular: Normal rate and regular rhythm.   Pulmonary/Chest: Effort normal and breath sounds normal. No respiratory distress. She has no wheezes. She has no rales.  Abdominal: Soft. She exhibits no distension. There is no tenderness. There is no rebound and no guarding.  Musculoskeletal:       Legs: Full passive ROM of left hip without pain.  No erythema, edema, warmth.  No  edema of the left leg, size is approximately equivalent to right.  Sensation and distal pulses intact.  No tenderness of spine.    Neurological: She is alert.  Skin: She is not diaphoretic.  Nursing note and vitals reviewed.    ED Treatments / Results  Labs (all labs ordered are listed, but only abnormal results are displayed) Labs Reviewed  BASIC METABOLIC PANEL - Abnormal; Notable for the following:       Result Value   Sodium 131 (*)    Chloride 98 (*)    Glucose, Bld 121 (*)    Calcium 8.4 (*)    All other components within normal limits  CBC WITH DIFFERENTIAL/PLATELET - Abnormal; Notable for the following:    WBC 69.1 (*)    Hemoglobin 10.7 (*)    HCT 33.9 (*)    MCV 70.2 (*)    MCH 22.2 (*)    Platelets 146 (*)    Lymphs Abs 62.9 (*)    All other components within normal limits  CK - Abnormal; Notable for the following:    Total CK 31 (*)    All other components within normal limits  URINALYSIS, ROUTINE W REFLEX MICROSCOPIC - Abnormal; Notable for the following:    Color, Urine AMBER (*)    APPearance CLOUDY (*)    Hgb urine dipstick LARGE (*)    Leukocytes, UA LARGE (*)    Bacteria, UA MANY (*)    Squamous Epithelial / LPF 0-5 (*)    All other components within normal limits  SEDIMENTATION RATE    EKG  EKG Interpretation None       Radiology Mr Hip Left Wo Contrast  Result Date: 08/12/2016 CLINICAL DATA:  Left hip pain EXAM: MR OF THE LEFT HIP WITHOUT CONTRAST TECHNIQUE: Multiplanar, multisequence MR imaging was performed. No intravenous contrast was administered. COMPARISON:  08/06/2016 CT FINDINGS: Bones: The bony sacrum, SI joints, pelvis and hips demonstrate no marrow edema or acute fracture. No bone destruction is identified. No dislocation of either femoral head. The femoral heads are spherical in appearance without flattening. Old left inferior pubic ramus fracture. Articular cartilage and labrum Articular cartilage: No focal chondral defects. Slight  chondral thinning noted bilaterally suggesting degenerate change. Labrum: Small anterior sublabral recess versus tear, series 7, image 9, suboptimally assessed due to lack of intra-articular fluid.  This can be further correlated with MR arthrography. A tiny paralabral cyst is noted posteriorly on series 6, image 21 which may infer a labral tear but given lack of joint distention, this is inadequately assessed and may also benefit from MR arthrography. Joint or bursal effusion Joint effusion:  No bursal fluid collections.  Effusion or fluid. Bursae:  No abnormal bursal fluid collections. Muscles and tendons Muscles and tendons:  Mild gluteal muscle atrophy bilaterally. Other findings Miscellaneous: Calcified uterine fibroid. Large volume of stool in the rectal vault with mild perirectal edema may represent stercoral colitis. IMPRESSION: No acute osseous abnormality of the bony pelvis and hips. Osteoarthritis both hips. Tiny posterior paralabral cyst name for a labral tear though not as discretely identified due to lack of joint distention with fluid. MR arthrography may help for further characterization and correlation. Additionally there is either a sublabral recess or anterior labral tear which could also be further correlated with MR arthrography. Fibroid uterus. Large volume of retained stool in the rectal vault with associated mild inflammation suggesting stercoral colitis. Electronically Signed   By: Ashley Royalty M.D.   On: 08/12/2016 21:46    Procedures Procedures (including critical care time)  Medications Ordered in ED Medications  oxyCODONE-acetaminophen (PERCOCET/ROXICET) 5-325 MG per tablet 1 tablet (not administered)     Initial Impression / Assessment and Plan / ED Course  I have reviewed the triage vital signs and the nursing notes.  Pertinent labs & imaging results that were available during my care of the patient were reviewed by me and considered in my medical decision making (see  chart for details).  Clinical Course as of Aug 12 2241  Wed Aug 12, 2016  2041 Discussed pt with Dr [name] who believes patient does not meet criteria for admission currently.  He will speak with Dr Kathrynn Humble regarding patient disposition.  MRI still pending.    [EW]  2214 Discussed pt with Dr Erlinda Hong.  Dr Kathrynn Humble spoke with him as well.  Recommendation for admission with IR consultation in the morning for steroid injection.  If not improved, will need orthopedic consultation.    [EW]    Clinical Course User Index [EW] Clayton Bibles, PA-C   Discussed pt with Dr Kathrynn Humble who also saw patient.  Afebrile nontoxic patient with two weeks of left hip pain without known injury.  She is on chronic prednisone for CLL.  Was seen in ED last week with negative xray and CT, pt discharged home but unable to ambulate.  Son returned requesting admission.   MRI demonstrates possible small labral tear.  Discussed with orthopedics as above.  MRI also notes inflammatory changes associated with constipation, not a symptom noted by the patient or son but likely related to recent hydrocodone use.  Admitted to Dr [name].    Final Clinical Impressions(s) / ED Diagnoses   Final diagnoses:  Pain  Hip pain  Tear of left acetabular labrum, initial encounter  Constipation, unspecified constipation type    New Prescriptions New Prescriptions   No medications on file     [place], PA-C 08/12/16 2243    Varney Biles, MD 08/13/16 1426

## 2016-08-13 ENCOUNTER — Observation Stay (HOSPITAL_COMMUNITY): Payer: Medicare Other

## 2016-08-13 ENCOUNTER — Encounter (HOSPITAL_COMMUNITY): Payer: Self-pay | Admitting: Family Medicine

## 2016-08-13 DIAGNOSIS — R2689 Other abnormalities of gait and mobility: Secondary | ICD-10-CM | POA: Diagnosis not present

## 2016-08-13 DIAGNOSIS — S73192D Other sprain of left hip, subsequent encounter: Secondary | ICD-10-CM | POA: Diagnosis not present

## 2016-08-13 DIAGNOSIS — C9110 Chronic lymphocytic leukemia of B-cell type not having achieved remission: Secondary | ICD-10-CM | POA: Diagnosis not present

## 2016-08-13 DIAGNOSIS — J4540 Moderate persistent asthma, uncomplicated: Secondary | ICD-10-CM

## 2016-08-13 DIAGNOSIS — K5909 Other constipation: Secondary | ICD-10-CM | POA: Diagnosis present

## 2016-08-13 DIAGNOSIS — M24252 Disorder of ligament, left hip: Secondary | ICD-10-CM | POA: Diagnosis present

## 2016-08-13 DIAGNOSIS — Z7952 Long term (current) use of systemic steroids: Secondary | ICD-10-CM | POA: Diagnosis not present

## 2016-08-13 DIAGNOSIS — K5900 Constipation, unspecified: Secondary | ICD-10-CM

## 2016-08-13 DIAGNOSIS — N390 Urinary tract infection, site not specified: Secondary | ICD-10-CM | POA: Diagnosis not present

## 2016-08-13 DIAGNOSIS — Z79899 Other long term (current) drug therapy: Secondary | ICD-10-CM | POA: Diagnosis not present

## 2016-08-13 DIAGNOSIS — S73192A Other sprain of left hip, initial encounter: Secondary | ICD-10-CM | POA: Diagnosis not present

## 2016-08-13 DIAGNOSIS — K219 Gastro-esophageal reflux disease without esophagitis: Secondary | ICD-10-CM | POA: Diagnosis present

## 2016-08-13 DIAGNOSIS — B961 Klebsiella pneumoniae [K. pneumoniae] as the cause of diseases classified elsewhere: Secondary | ICD-10-CM | POA: Diagnosis present

## 2016-08-13 DIAGNOSIS — Z7951 Long term (current) use of inhaled steroids: Secondary | ICD-10-CM | POA: Diagnosis not present

## 2016-08-13 DIAGNOSIS — D696 Thrombocytopenia, unspecified: Secondary | ICD-10-CM | POA: Diagnosis present

## 2016-08-13 DIAGNOSIS — E871 Hypo-osmolality and hyponatremia: Secondary | ICD-10-CM | POA: Diagnosis not present

## 2016-08-13 DIAGNOSIS — J449 Chronic obstructive pulmonary disease, unspecified: Secondary | ICD-10-CM | POA: Diagnosis present

## 2016-08-13 DIAGNOSIS — D509 Iron deficiency anemia, unspecified: Secondary | ICD-10-CM | POA: Diagnosis present

## 2016-08-13 DIAGNOSIS — Z880 Allergy status to penicillin: Secondary | ICD-10-CM | POA: Diagnosis not present

## 2016-08-13 DIAGNOSIS — M25552 Pain in left hip: Secondary | ICD-10-CM | POA: Diagnosis not present

## 2016-08-13 HISTORY — DX: Other sprain of left hip, initial encounter: S73.192A

## 2016-08-13 LAB — CBC
HCT: 32.6 % — ABNORMAL LOW (ref 36.0–46.0)
Hemoglobin: 10.2 g/dL — ABNORMAL LOW (ref 12.0–15.0)
MCH: 21.7 pg — AB (ref 26.0–34.0)
MCHC: 31.3 g/dL (ref 30.0–36.0)
MCV: 69.5 fL — ABNORMAL LOW (ref 78.0–100.0)
PLATELETS: 175 10*3/uL (ref 150–400)
RBC: 4.69 MIL/uL (ref 3.87–5.11)
RDW: 13.9 % (ref 11.5–15.5)
WBC: 58.9 10*3/uL (ref 4.0–10.5)

## 2016-08-13 LAB — BASIC METABOLIC PANEL
Anion gap: 6 (ref 5–15)
BUN: 10 mg/dL (ref 6–20)
CALCIUM: 8.7 mg/dL — AB (ref 8.9–10.3)
CO2: 27 mmol/L (ref 22–32)
CREATININE: 0.58 mg/dL (ref 0.44–1.00)
Chloride: 104 mmol/L (ref 101–111)
GFR calc non Af Amer: >60 mL/min (ref >60)
Glucose, Bld: 131 mg/dL — ABNORMAL HIGH (ref 65–99)
Potassium: 3.9 mmol/L (ref 3.5–5.1)
Sodium: 137 mmol/L (ref 135–145)

## 2016-08-13 LAB — APTT: aPTT: 26 seconds (ref 24–36)

## 2016-08-13 LAB — GLUCOSE, CAPILLARY: GLUCOSE-CAPILLARY: 127 mg/dL — AB (ref 65–99)

## 2016-08-13 LAB — PROTIME-INR
INR: 1.03
PROTHROMBIN TIME: 13.5 s (ref 11.4–15.2)

## 2016-08-13 MED ORDER — LIDOCAINE HCL 1 % IJ SOLN
INTRAMUSCULAR | Status: AC
  Filled 2016-08-13: qty 20

## 2016-08-13 MED ORDER — METHYLPREDNISOLONE ACETATE 40 MG/ML IJ SUSP
INTRAMUSCULAR | Status: AC
  Filled 2016-08-13: qty 1

## 2016-08-13 MED ORDER — IOPAMIDOL (ISOVUE-300) INJECTION 61%
INTRAVENOUS | Status: AC
  Filled 2016-08-13: qty 50

## 2016-08-13 MED ORDER — BUPIVACAINE HCL (PF) 0.25 % IJ SOLN
INTRAMUSCULAR | Status: AC
  Administered 2016-08-13: 16:00:00
  Filled 2016-08-13: qty 30

## 2016-08-13 MED ORDER — SULFAMETHOXAZOLE-TRIMETHOPRIM 800-160 MG PO TABS
1.0000 | ORAL_TABLET | Freq: Two times a day (BID) | ORAL | Status: DC
  Administered 2016-08-13 – 2016-08-14 (×3): 1 via ORAL
  Filled 2016-08-13 (×3): qty 1

## 2016-08-13 MED ORDER — METHYLPREDNISOLONE ACETATE 80 MG/ML IJ SUSP
INTRAMUSCULAR | Status: AC
  Administered 2016-08-13: 16:00:00
  Filled 2016-08-13: qty 1

## 2016-08-13 NOTE — Progress Notes (Signed)
PROGRESS NOTE    Stefanie Henry  [id number]  DOB: June 16, 1930  DOA: 08/12/2016 PCP: Mackie Pai, [facility] course: Amazin Lasswell is a 83 y.o. female with medical history significant for CLL managed with daily prednisone and observation, moderate persistent asthma, iron deficiency anemia, GERD, and chronic constipation presenting to the emergency department for evaluation of severe left hip pain and inability to bear weight. Patient reports that she had been in her usual state of health until approximately 2 weeks ago when she noted the insidious development of pain in the left hip. There has been no fall, trauma, or inciting event identified. This has progressively worsened and has now become severe. Pain is described as severe, sharp, localized to the left hip, worse with any movement or weightbearing, and better with rest and offloading. Patient was evaluated in the emergency department on 08/06/2016 for these complaints with negative radiographs and negative CT. Patient was provided oral analgesics and discharged home. She was to follow-up with her primary care physician today with regards to this pain, but the symptoms were so severe that she was diverted to the emergency department.  Assessment & Plan:   1. Severe left hip pain, labral tear  - Pt presents with severe left hip pain, worsening over past 2 wks with fall, trauma, or identifiable inciting event  - Plain radiographs and CT negative for fracture in ED on 08/06/16 and she was discharged home - Pain has continued to worsen and she is unable to bear wt; requiring maximal assistance from son to get to toilet; at risk for falls; lives on 2nd floor, PT eval pending.   - MRI was obtained in ED with findings suggestive of labral tear - Orthopedic surgery consulted by ED physician, advises medical admission for intraarticular steroid injection by IR - IR consultation requested, PT eval requested, continue prn analgesia    2.  Acute on chronic constipation  - Incidentally noted on MRI of hip is large vol retained stool in rectum with mild associated colitis  - Will treat with magnesium citrate now, then MiraLax daily and prn enema    3. CLL - Followed by oncology and managed with daily prednisone 10 mg  - She has refused chemotherapy per oncology notes  - WBC 69k on admission, up from mid-40's last month, but improved from 83,500 in November 2017  - Seems to be asymptomatic on admission  - Continue daily prednisone    4. Microcytic anemia, thrombocytopenia  - Hgb 10.7 on admission with MCV 70.2; this is stable relative to recent priors, managed by hematology/oncology with iron infusions, and with no active blood-loss apparent - Platelets are very mildly reduced on admission to 146,000 without bleeding; has been intermittently low in past    5. Asthma, moderate-persistent  - Stable on admission with no wheezing or cough - Continue scheduled Dulera and prn albuterol    6. Hyponatremia, chronic - Serum sodium is 131 on admission  - This is stable relative to priors and she appears to be euvolemic   7.  UTI - suspect klebsiella from recent urine culture 1/18.   Pt reports rash with cephalosporin and cillins, start Bactrim DS BID, follow urine culture.    DVT prophylaxis: SCDs Code Status: Full  Family Communication: Son Disposition Plan: med-surg Consults called: Orthopedic surgery, IR, PT  Consultants:  IR  Procedures:  pending  Subjective: Pain better controlled  Objective: Vitals:   08/12/16 2339 08/13/16 0045 08/13/16 0523 08/13/16 0924  BP:  158/82 (!) 148/78 114/67   Pulse: 92 100 98 95  Resp: 18 18 18 18   Temp:  98.8 F (37.1 C) 98.7 F (37.1 C)   TempSrc:  Oral Oral   SpO2: 92% 99% 97% 97%  Weight:      Height:        Intake/Output Summary (Last 24 hours) at 08/13/16 1145 Last data filed at 08/13/16 0524  Gross per 24 hour  Intake                0 ml  Output               400 ml  Net             -400 ml   Filed Weights   08/12/16 2033  Weight: 64.4 kg (142 lb)    Exam:  General exam: NAD, calm, comfortable Respiratory system:  No increased work of breathing. Cardiovascular system: S1 & S2 heard Gastrointestinal system: Abdomen is nondistended, soft and nontender. Normal bowel sounds heard. Central nervous system: Alert and oriented. No focal neurological deficits. Extremities: painful left hip not able to bear weight.  Data Reviewed: Basic Metabolic Panel:  Recent Labs Lab 08/06/16 2010 08/12/16 1901 08/13/16 0428  NA 134* 131* 137  K 3.6 4.5 3.9  CL 100* 98* 104  CO2 25 26 27   GLUCOSE 108* 121* 131*  BUN 8 10 10   CREATININE 0.60 0.65 0.58  CALCIUM 8.3* 8.4* 8.7*   Liver Function Tests: No results for input(s): AST, ALT, ALKPHOS, BILITOT, PROT, ALBUMIN in the last 168 hours. No results for input(s): LIPASE, AMYLASE in the last 168 hours. No results for input(s): AMMONIA in the last 168 hours. CBC:  Recent Labs Lab 08/06/16 2010 08/12/16 1901 08/13/16 0428  WBC 43.1* 69.1* 58.9*  NEUTROABS 2.6 5.5  --   HGB 10.3* 10.7* 10.2*  HCT 32.1* 33.9* 32.6*  MCV 70.1* 70.2* 69.5*  PLT 159 146* 175   Cardiac Enzymes:  Recent Labs Lab 08/12/16 1901  CKTOTAL 31*   CBG (last 3)   Recent Labs  08/13/16 0838  GLUCAP 127*   No results found for this or any previous visit (from the past 240 hour(s)).   Studies: Mr Hip Left Wo Contrast  Result Date: 08/12/2016 CLINICAL DATA:  Left hip pain EXAM: MR OF THE LEFT HIP WITHOUT CONTRAST TECHNIQUE: Multiplanar, multisequence MR imaging was performed. No intravenous contrast was administered. COMPARISON:  08/06/2016 CT FINDINGS: Bones: The bony sacrum, SI joints, pelvis and hips demonstrate no marrow edema or acute fracture. No bone destruction is identified. No dislocation of either femoral head. The femoral heads are spherical in appearance without flattening. Old left inferior pubic  ramus fracture. Articular cartilage and labrum Articular cartilage: No focal chondral defects. Slight chondral thinning noted bilaterally suggesting degenerate change. Labrum: Small anterior sublabral recess versus tear, series 7, image 9, suboptimally assessed due to lack of intra-articular fluid. This can be further correlated with MR arthrography. A tiny paralabral cyst is noted posteriorly on series 6, image 21 which may infer a labral tear but given lack of joint distention, this is inadequately assessed and may also benefit from MR arthrography. Joint or bursal effusion Joint effusion:  No bursal fluid collections.  Effusion or fluid. Bursae:  No abnormal bursal fluid collections. Muscles and tendons Muscles and tendons:  Mild gluteal muscle atrophy bilaterally. Other findings Miscellaneous: Calcified uterine fibroid. Large volume of stool in the rectal vault with mild perirectal edema may  represent stercoral colitis. IMPRESSION: No acute osseous abnormality of the bony pelvis and hips. Osteoarthritis both hips. Tiny posterior paralabral cyst name for a labral tear though not as discretely identified due to lack of joint distention with fluid. MR arthrography may help for further characterization and correlation. Additionally there is either a sublabral recess or anterior labral tear which could also be further correlated with MR arthrography. Fibroid uterus. Large volume of retained stool in the rectal vault with associated mild inflammation suggesting stercoral colitis. Electronically Signed   By: Ashley Royalty M.D.   On: 08/12/2016 21:46   Scheduled Meds: . calcitonin (salmon)  1 spray Alternating Nares Daily  . cyanocobalamin  500 mcg Oral Daily  . docusate sodium  100 mg Oral BID  . mometasone-formoterol  2 puff Inhalation BID  . multivitamin with minerals  1 tablet Oral Daily  . pantoprazole  40 mg Oral Daily  . polyethylene glycol  17 g Oral Daily  . predniSONE  10 mg Oral Daily  . sodium  chloride flush  3 mL Intravenous Q12H  . sucralfate  1 g Oral BID  . sulfamethoxazole-trimethoprim  1 tablet Oral Q12H   Continuous Infusions:  Principal Problem:   Tear of left acetabular labrum Active Problems:   CLL (chronic lymphocytic leukemia) ([facility])   Hyponatremia   Asthma, moderate persistent   UTI (urinary tract infection)   Unable to bear weight   Constipation  Time spent:   Irwin Brakeman, MD, FAAFP [facility] Pager [phone]  If 7PM-7AM, please contact night-coverage [email or web address] Password [email or web address] 08/13/2016, 11:45 AM    LOS: 0 days

## 2016-08-13 NOTE — Progress Notes (Signed)
PT Cancellation Note  Patient Details Name: Stefanie Henry MRN: [id number] DOB: 1930/02/19   Cancelled Treatment:    Reason Eval/Treat Not Completed: Medical issues which prohibited therapy (IR injectionis planned. will wait for it to be completed.)   Claretha Cooper 08/13/2016, 1:02 PM Tresa Endo PT [phone]

## 2016-08-14 ENCOUNTER — Encounter (HOSPITAL_COMMUNITY): Payer: Self-pay | Admitting: Family Medicine

## 2016-08-14 DIAGNOSIS — E871 Hypo-osmolality and hyponatremia: Secondary | ICD-10-CM

## 2016-08-14 DIAGNOSIS — S73192D Other sprain of left hip, subsequent encounter: Secondary | ICD-10-CM

## 2016-08-14 LAB — GLUCOSE, CAPILLARY: Glucose-Capillary: 172 mg/dL — ABNORMAL HIGH (ref 65–99)

## 2016-08-14 MED ORDER — SULFAMETHOXAZOLE-TRIMETHOPRIM 800-160 MG PO TABS: 1.0000 | ORAL_TABLET | Freq: Two times a day (BID) | ORAL | 0 refills | Status: AC

## 2016-08-14 MED ORDER — HYDROCODONE-ACETAMINOPHEN 5-325 MG PO TABS: ORAL_TABLET | ORAL | 0 refills | Status: DC

## 2016-08-14 NOTE — Progress Notes (Signed)
Physical Therapy Treatment Patient Details Name: Stefanie Henry MRN: [id number] DOB: 08/14/1929 Today's Date: 08/14/2016    History of Present Illness Stefanie Henry is a 83 y.o. female with medical history significant for CLL managed with daily prednisone and observation, moderate persistent asthma, iron deficiency anemia, GERD, and chronic constipation presenting to the emergency department 08/12/16  for evaluation of severe left hip pain and inability to bear weight. s/p injection of the left hip for questionalble  labrum tear.    PT Comments    The patient able to negotiate 2 steps with son present.  Follow Up Recommendations  Home health PT;Supervision/Assistance - 24 hour     Equipment Recommendations  None recommended by PT    Recommendations for Other Services       Precautions / Restrictions Precautions Precautions: Fall    Mobility  Bed Mobility Overal bed mobility: Needs Assistance Bed Mobility: Supine to Sit     Supine to sit: Supervision     General bed mobility comments: extra time, assist with trunk, patient self assisting mostly.  Transfers Overall transfer level: Needs assistance Equipment used: Rolling walker (2 wheeled) Transfers: Sit to/from Stand Sit to Stand: Min guard Stand pivot transfers: Min assist       General transfer comment: extra time, tactile cues   Ambulation/Gait Ambulation/Gait assistance: Min assist Ambulation Distance (Feet): 40 Feet Assistive device: Rolling walker (2 wheeled) Gait Pattern/deviations: Step-to pattern;Step-through pattern     General Gait Details: cues for sequence   Stairs Stairs: Yes   Stair Management: One rail Left;Step to pattern;Forwards Number of Stairs: 2 General stair comments: son present  Wheelchair Mobility    Modified Rankin (Stroke Patients Only)       Balance                                    Cognition Arousal/Alertness: Awake/alert Behavior During Therapy:  WFL for tasks assessed/performed Overall Cognitive Status: Within Functional Limits for tasks assessed                      Exercises      General Comments        Pertinent Vitals/Pain Pain Assessment: Faces Pain Score: 0-No pain Faces Pain Scale: Hurts little more Pain Location: left groin Pain Descriptors / Indicators: Sore Pain Intervention(s): Monitored during session    Home Living Family/patient expects to be discharged to:: Private residence Living Arrangements: Children Available Help at Discharge: Family Type of Home: Apartment Home Access: Stairs to enter Entrance Stairs-Rails: Right Home Layout: One level Home Equipment: Bedside commode      Prior Function Level of Independence: Independent with assistive device(s)      Comments: amb with cane at baseline, recently only transfers due to pain per son   PT Goals (current goals can now be found in the care plan section) Acute Rehab PT Goals Patient Stated Goal: to go home per son PT Goal Formulation: With patient/family Time For Goal Achievement: 08/21/16 Potential to Achieve Goals: Good Progress towards PT goals: Progressing toward goals    Frequency    Min 5X/week      PT Plan Current plan remains appropriate    Co-evaluation             End of Session   Activity Tolerance: Patient tolerated treatment well Patient left: in chair     Time: [id number] PT Time Calculation (min) (  ACUTE ONLY): 14 min  Charges:  $Gait Training: 8-22 mins                    G Codes:      Claretha Cooper 08/14/2016, 1:45 PM Tresa Endo PT [phone]

## 2016-08-14 NOTE — Progress Notes (Signed)
MD paged at 1440, about family's request for pain medication prescription.

## 2016-08-14 NOTE — Evaluation (Signed)
Physical Therapy Evaluation Patient Details Name: Stefanie Henry MRN: [id number] DOB: 06/29/1930 Today's Date: 08/14/2016   History of Present Illness  Stefanie Henry is a 83 y.o. female with medical history significant for CLL managed with daily prednisone and observation, moderate persistent asthma, iron deficiency anemia, GERD, and chronic constipation presenting to the emergency department 08/12/16  for evaluation of severe left hip pain and inability to bear weight. s/p injection of the left hip for questionable  labrum tear.  Clinical Impression  The patient's son present for interpreter. The patient was able to mobilize to the bed edge, to the [facility] and then ambulated x 20'. The patient does have 14 steps into the apartment. Will practice  A few next visit.Pt admitted with above diagnosis. Pt currently with functional limitations due to the deficits listed below (see PT Problem List).  Pt will benefit from skilled PT to increase their independence and safety with mobility to allow discharge to the venue listed below.       Follow Up Recommendations Home health PT;Supervision/Assistance - 24 hour    Equipment Recommendations  None recommended by PT    Recommendations for Other Services       Precautions / Restrictions Precautions Precautions: Fall      Mobility  Bed Mobility Overal bed mobility: Needs Assistance Bed Mobility: Supine to Sit     Supine to sit: Min assist     General bed mobility comments: extra time, assist with trunk, patient self assisting mostly.  Transfers Overall transfer level: Needs assistance Equipment used: Rolling walker (2 wheeled) Transfers: Sit to/from [facility] Sit to Stand: Mod assist Stand pivot transfers: Min assist       General transfer comment: extra time, tactile cues   Ambulation/Gait Ambulation/Gait assistance: Min assist Ambulation Distance (Feet): 20 Feet Assistive device: Rolling walker (2 wheeled) Gait  Pattern/deviations: Step-to pattern;Antalgic     General Gait Details: cues for sequence  Stairs            Wheelchair Mobility    Modified Rankin (Stroke Patients Only)       Balance                                             Pertinent Vitals/Pain Pain Assessment: Faces Pain Score: 0-No pain Pain Descriptors / Indicators: Sore Pain Intervention(s): Monitored during session    Home Living Family/patient expects to be discharged to:: Private residence Living Arrangements: Children Available Help at Discharge: Family Type of Home: Apartment Home Access: Stairs to enter Entrance Stairs-Rails: Right Entrance Stairs-Number of Steps: 7 with 2 rails, threshhold and 7 with right rail Home Layout: One level Home Equipment: Bedside commode      Prior Function Level of Independence: Independent with assistive device(s)         Comments: amb with cane at baseline, recently only transfers due to pain per son     Hand Dominance        Extremity/Trunk Assessment   Upper Extremity Assessment Upper Extremity Assessment: Overall WFL for tasks assessed    Lower Extremity Assessment Lower Extremity Assessment: LLE deficits/detail LLE Deficits / Details: able to bear weight,        Communication   Communication: Prefers language other than English (Hindi)  Cognition Arousal/Alertness: Awake/alert Behavior During Therapy: WFL for tasks assessed/performed Overall Cognitive Status: Within Functional Limits for tasks assessed  General Comments      Exercises     Assessment/Plan    PT Assessment Patient needs continued PT services  PT Problem List Decreased strength;Decreased range of motion;Decreased activity tolerance;Decreased mobility;Decreased knowledge of precautions;Decreased safety awareness;Decreased knowledge of use of DME          PT Treatment Interventions DME instruction;Gait training;Stair  training;Functional mobility training;Therapeutic activities;Therapeutic exercise;Patient/family education    PT Goals (Current goals can be found in the Care Plan section)  Acute Rehab PT Goals Patient Stated Goal: to go home per son PT Goal Formulation: With patient/family Time For Goal Achievement: 08/21/16 Potential to Achieve Goals: Good    Frequency Min 5X/week   Barriers to discharge        Co-evaluation               End of Session   Activity Tolerance: Patient tolerated treatment well Patient left: in chair;with call bell/phone within reach;with family/visitor present Nurse Communication: Mobility status         Time: 0817-0902 PT Time Calculation (min) (ACUTE ONLY): 45 min   Charges:   PT Evaluation $PT Eval Low Complexity: 1 Procedure PT Treatments $Gait Training: 23-37 mins   PT G Codes:        Claretha Cooper 08/14/2016, 9:55 AM Tresa Endo PT [phone]

## 2016-08-14 NOTE — Discharge Summary (Signed)
Physician Discharge Summary  Stefanie Henry [id number] DOB: [birth year] DOA: 08/12/2016  PCP: Mackie Pai, PA-C  Admit date: 08/12/2016 Discharge date: 08/14/2016  Admitted From: Home Disposition: Home, family declines SNF  Recommendations for Outpatient Follow-up:  1. Follow up with PCP in 1 weeks 2. Follow up with orthopedics in 1-2 weeks  Discharge Condition: STABLE  CODE STATUS: FULL   Brief/Interim Summary: HPI: Stefanie Henry is a 83 y.o. female with medical history significant for CLL managed with daily prednisone and observation, moderate persistent asthma, iron deficiency anemia, GERD, and chronic constipation presenting to the emergency department for evaluation of severe left hip pain and inability to bear weight. Patient reports that she had been in her usual state of health until approximately 2 weeks ago when she noted the insidious development of pain in the left hip. There has been no fall, trauma, or inciting event identified. This has progressively worsened and has now become severe. Pain is described as severe, sharp, localized to the left hip, worse with any movement or weightbearing, and better with rest and offloading. Patient was evaluated in the emergency department on 08/06/2016 for these complaints with negative radiographs and negative CT. Patient was provided oral analgesics and discharged home. She was to follow-up with her primary care physician today with regards to this pain, but the symptoms were so severe that she was diverted to the emergency department.  ED Course: Upon arrival to the ED, patient is found to be afebrile, saturating well on room air, and with vital signs otherwise stable. Chemistry panel is notable for stable chronic hyponatremia and CBC features a WBC of 69,100, hemoglobin of 10.7, MCV 70.2, and platelets 146,000. Smudge cells are noted on the peripheral smear. Sedimentation rate is normal at 4 and CK is only 31. MRI of the left hip was  obtained and negative for acute bony abnormality, but with findings suggestive of a labral tear. Also noted on the MRI is osteoarthritic changes to bilateral hips and a large volume of retained stool in the rectal vault with mild surrounding inflammation. Patient was given 1 dose of Percocet 5 mg and orthopedic surgery was consulted by the ED physician. Orthopedic consultant advised a medical admission in order to consult interventional radiology for intra-articular steroid injection. She has remained hemodynamically stable and in no apparent respiratory distress and will be observed on the medical surgical unit for ongoing evaluation and management of severe left hip pain with inability to bear weight and imaging findings suggestive of possible labral tear.  1. Severe left hip pain, labral tear  - Pt presents with severe left hip pain, worsening over past 2 wks with fall, trauma, or identifiable inciting event  - Plain radiographs and CT negative for fracture in ED on 08/06/16 and she was discharged home - Pain has continued to worsen and she is unable to bear wt; requiring maximal assistance from son to get to toilet; at risk for falls; lives on 2nd floor, PT eval noted patient could go home with assistance, home health services   - MRI was obtained in ED with findings suggestive of labral tear - Orthopedic surgery consulted by ED physician, advises medical admission for intraarticular steroid injection by IR - IR consultation requested and injection done 2/15.  Pt tolerated it well.    2. Acute on chronic constipation - Incidentally noted on MRI of hip is large vol retained stool in rectum with mild associated colitis   3. CLL - Followed by oncology and managed with  daily prednisone 10 mg  - She has refused chemotherapy per oncology notes  - WBC 69k on admission, up from mid-40's last month, but improved from 83,500 in November 2017  - Seems to be asymptomatic on admission  - Continue daily  prednisone   4. Microcytic anemia, thrombocytopenia  - Hgb 10.7 on admission with MCV 70.2; this is stable relative to recent priors, managed by hematology/oncology with iron infusions, and with no active blood-loss apparent - Platelets are very mildly reduced on admission to 146,000 without bleeding; has been intermittently low in past   5. Asthma, moderate-persistent  - Stable on admission with no wheezing or cough - Continue scheduled Dulera and prn albuterol   6. Hyponatremia, chronic - Serum sodium is 131 on admission  - This is stable relative to priors and she appears to be euvolemic  - Her sodium improved to normal 137 prior to discharge.   7.  UTI - suspect klebsiella from recent urine culture 1/18.   Pt reports rash with cephalosporin and cillins, started Bactrim DS BID, follow urine culture.    DVT prophylaxis:SCDs Code Status:Full  Family Communication:Son Disposition Plan:Home with son Consults called:Orthopedic surgery, IR, PT  Consultants:  IR  Discharge Diagnoses:  Principal Problem:   Tear of left acetabular labrum Active Problems:   CLL (chronic lymphocytic leukemia) (HCC)   Hyponatremia   Asthma, moderate persistent   UTI (urinary tract infection)   Unable to bear weight   Constipation   Labral tear of left hip joint    Discharge Instructions  Discharge Instructions    Increase activity slowly    Complete by:  As directed      Allergies as of 08/14/2016      Reactions   Citrus Other (See Comments)   Stomach discomfort   Other    Cannot take any antibiotics, causes rash   Augmentin [amoxicillin-pot Clavulanate] Swelling, Rash   Ibuprofen Other (See Comments)   Stomach discomfort   Ivp Dye [iodinated Diagnostic Agents] Other (See Comments)   Stomach discomfort   Keflex [cephalexin] Swelling, Rash      Medication List    TAKE these medications   albuterol (2.5 MG/3ML) 0.083% nebulizer solution Commonly known as:   PROVENTIL Take 3 mLs (2.5 mg total) by nebulization 2 (two) times daily. What changed:  Another medication with the same name was changed. Make sure you understand how and when to take each.   albuterol 108 (90 Base) MCG/ACT inhaler Commonly known as:  VENTOLIN HFA INHALE 1-2 PUFFS INTO THE LUNGS EVERY 6 HOURS AS NEEDED FOR WHEEZING OR SHORTNESS OF BREATH. What changed:  how much to take  how to take this  when to take this  reasons to take this  additional instructions   calcitonin (salmon) 200 UNIT/ACT nasal spray Commonly known as:  MIACALCIN/FORTICAL Place 1 spray into alternate nostrils daily.   gabapentin 100 MG capsule Commonly known as:  NEURONTIN TAKE 2 CAPSULES BY MOUTH 3 TIMES A DAY AS NEEDED FOR NERVE PAIN   GARLIC PO Take 1 tablet by mouth daily.   GAS-X PO Take 2 capsules by mouth daily as needed (for gas).   HYDROcodone-acetaminophen 5-325 MG tablet Commonly known as:  NORCO/VICODIN Take 1/2 -1 tablets every 6 hours as needed for severe pain   hydrocortisone 25 MG suppository Commonly known as:  ANUSOL-HC Place 1 suppository (25 mg total) rectally 2 (two) times daily. What changed:  when to take this  reasons to take this  hyoscyamine 0.125 MG SL tablet Commonly known as:  LEVSIN SL PLACE 1 TABLET (0.125 MG TOTAL) UNDER THE TONGUE EVERY 8 (EIGHT) HOURS AS NEEDED. What changed:  how much to take  how to take this  when to take this  reasons to take this  additional instructions   mometasone-formoterol 200-5 MCG/ACT Aero Commonly known as:  DULERA Inhale 2 puffs into the lungs 2 (two) times daily.   multivitamin with minerals Tabs tablet Take 1 tablet by mouth daily.   NEOMYCIN-POLYMYXIN-HYDROCORTISONE 1 % Soln otic solution Commonly known as:  CORTISPORIN PLACE 3 DROPS INTO THE LEFT EAR EVERY 8 (EIGHT) HOURS.   omeprazole 20 MG capsule Commonly known as:  PRILOSEC TAKE ONE CAPSULE BY MOUTH EVERY DAY   predniSONE 10 MG  tablet Commonly known as:  DELTASONE TAKE 1 TABLET BY MOUTH EVERY DAY   psyllium 58.6 % packet Commonly known as:  METAMUCIL Take 1 packet by mouth daily.   sucralfate 1 g tablet Commonly known as:  CARAFATE TAKE 2 TABLETS IN THE MORNING AND AT BEDTIME   sulfamethoxazole-trimethoprim 800-160 MG tablet Commonly known as:  BACTRIM DS,SEPTRA DS Take 1 tablet by mouth every 12 (twelve) hours.   vitamin B-12 500 MCG tablet Commonly known as:  CYANOCOBALAMIN Take 500 mcg by mouth daily.   vitamin C 1000 MG tablet Take 1,000 mg by mouth daily.      Follow-up Information    Saguier, Iris Pert. Schedule an appointment as soon as possible for a visit in 1 week(s).   Specialties:  Internal Medicine, Family Medicine Contact information: [address] [place] [address] [phone]        Eduard Roux, MD. Schedule an appointment as soon as possible for a visit in 2 week(s).   Specialty:  Orthopedic Surgery Why:  Hospital Follow up Hip Pain Contact information: [place] [address] [phone]          Allergies  Allergen Reactions  . Citrus Other (See Comments)    Stomach discomfort  . Other     Cannot take any antibiotics, causes rash  . Augmentin [Amoxicillin-Pot Clavulanate] Swelling and Rash  . Ibuprofen Other (See Comments)    Stomach discomfort  . Ivp Dye [Iodinated Diagnostic Agents] Other (See Comments)    Stomach discomfort  . Keflex [Cephalexin] Swelling and Rash    Procedures/Studies: Ct Abdomen Pelvis Wo Contrast  Result Date: 07/29/2016 CLINICAL DATA:  Initial evaluation for acute left-sided abdominal pain, diarrhea. EXAM: CT ABDOMEN AND PELVIS WITHOUT CONTRAST TECHNIQUE: Multidetector CT imaging of the abdomen and pelvis was performed following the standard protocol without IV contrast. COMPARISON:  Prior CT from 12/12/2015. FINDINGS: Lower chest: Mild scattered atelectatic changes/ scarring present  within the visualized lung bases. Visualized lungs are otherwise clear. Hepatobiliary: Liver demonstrates a normal unenhanced appearance. Gallbladder within normal limits. No biliary dilatation. Pancreas: Pancreas within normal limits. Spleen: Spleen within normal limits. Adrenals/Urinary Tract: Adrenal glands are normal. Kidneys equal in size without evidence for nephrolithiasis or hydronephrosis. Small exophytic cyst noted extending from the right kidney. No hydroureter. Bladder partially distended without acute abnormality. Stomach/Bowel: Stomach within normal limits. No evidence for bowel obstruction. Colonic diverticulosis present. Mild hazy stranding about a few diverticula within the left lower quadrant, consistent with mild/early acute diverticulitis. No other acute inflammatory changes seen about the bowels. Vascular/Lymphatic: Mild aorto bi-iliac atherosclerotic disease. No adenopathy. Reproductive: Fibroid uterus noted.  Ovaries within normal limits. Other: No free air or  fluid. Small fat containing paraumbilical hernia. Musculoskeletal: There is an acute to subacute appearing fracture through the superior endplate of L4. Minimal height loss without bony retropulsion. No other acute osseous abnormality. Chronic compression deformity involving the L1 vertebral body is stable. No worrisome lytic or blastic osseous lesions. IMPRESSION: 1. Mild hazy inflammatory stranding about a few diverticula within the left lower quadrant, consistent with mild/early acute diverticulitis. No complication identified. 2. Acute to subacute fracture of the superior endplate of L4. 3. Fibroid uterus. Electronically Signed   By: Jeannine Boga M.D.   On: 07/29/2016 19:26   Ct Femur Left Wo Contrast  Result Date: 08/06/2016 CLINICAL DATA:  Bilateral flank pain. EXAM: CT OF THE LOWER LEFT EXTREMITY WITHOUT CONTRAST TECHNIQUE: Multidetector CT imaging of the lower left extremity was performed according to the standard  protocol. COMPARISON:  None. FINDINGS: Bones/Joint/Cartilage Osteoarthritis of the left hip joint with joint space narrowing. No intra-articular loose bodies nor significant joint effusion. Tricompartmental osteoarthritis of the left knee with joint space narrowing, subchondral sclerosis, spurring and degenerative cystic change. No acute fracture nor bone destruction. Old healed left inferior pubic ramus fracture. Ligaments Suboptimally assessed by CT. Muscles and Tendons Small left supracondylar soft tissue calcification of doubtful clinical significance possibly phlebolith. No muscle atrophy, hematoma or tear. Intact extensor mechanism tendons of the knee. Soft tissues Fibroid uterus partially imaged. No adnexal mass. No free fluid in the pelvis. Colonic diverticulosis. IMPRESSION: 1. Osteoarthritis of the left hip and knee. No acute osseous abnormality. Old left inferior pubic ramus fracture. 2. Fibroid uterus. 3. Colonic diverticulosis. Electronically Signed   By: Ashley Royalty M.D.   On: 08/06/2016 21:53   Mr Hip Left Wo Contrast  Result Date: 08/12/2016 CLINICAL DATA:  Left hip pain EXAM: MR OF THE LEFT HIP WITHOUT CONTRAST TECHNIQUE: Multiplanar, multisequence MR imaging was performed. No intravenous contrast was administered. COMPARISON:  08/06/2016 CT FINDINGS: Bones: The bony sacrum, SI joints, pelvis and hips demonstrate no marrow edema or acute fracture. No bone destruction is identified. No dislocation of either femoral head. The femoral heads are spherical in appearance without flattening. Old left inferior pubic ramus fracture. Articular cartilage and labrum Articular cartilage: No focal chondral defects. Slight chondral thinning noted bilaterally suggesting degenerate change. Labrum: Small anterior sublabral recess versus tear, series 7, image 9, suboptimally assessed due to lack of intra-articular fluid. This can be further correlated with MR arthrography. A tiny paralabral cyst is noted  posteriorly on series 6, image 21 which may infer a labral tear but given lack of joint distention, this is inadequately assessed and may also benefit from MR arthrography. Joint or bursal effusion Joint effusion:  No bursal fluid collections.  Effusion or fluid. Bursae:  No abnormal bursal fluid collections. Muscles and tendons Muscles and tendons:  Mild gluteal muscle atrophy bilaterally. Other findings Miscellaneous: Calcified uterine fibroid. Large volume of stool in the rectal vault with mild perirectal edema may represent stercoral colitis. IMPRESSION: No acute osseous abnormality of the bony pelvis and hips. Osteoarthritis both hips. Tiny posterior paralabral cyst name for a labral tear though not as discretely identified due to lack of joint distention with fluid. MR arthrography may help for further characterization and correlation. Additionally there is either a sublabral recess or anterior labral tear which could also be further correlated with MR arthrography. Fibroid uterus. Large volume of retained stool in the rectal vault with associated mild inflammation suggesting stercoral colitis. Electronically Signed   By: Meredith Leeds.D.  On: 08/12/2016 21:46   Dg Fluoro Guided Needle Plc Aspiration/injection Loc  Result Date: 08/13/2016 CLINICAL DATA:  Left hip pain.  Possible labral tear EXAM: Left HIP INJECTION UNDER FLUOROSCOPY FLUOROSCOPY TIME:  Fluoroscopy Time:  0 minutes 23 seconds Radiation Exposure Index (if provided by the fluoroscopic device): Number of Acquired Spot Images: 0 PROCEDURE: Patient's both limiting less. Informed written consent was obtained from the patient's son who sign the consent form. The left hip joint was localized under fluoroscopy. The femoral artery was marked by palpation. The skin was prepped with Betadine. 1% lidocaine used for local anesthesia. Patient has a history of allergy to intravenous contrast which therefore was not used. 22 gauge spinal knee was advanced  down to the level of the femoral neck. Bone was encountered and the needle was withdrawn 2 mm. No contrast injected. 120mg  Depo-Medrol and 5 ml Sensorcaine 0.5% were then administered. No immediate complication. IMPRESSION: Technically successful left hip injection under fluoroscopy. Electronically Signed   By: Franchot Gallo M.D.   On: 08/13/2016 16:11   Dg Hip Unilat W Or Wo Pelvis 2-3 Views Left  Result Date: 08/06/2016 CLINICAL DATA:  Persistent LEFT hip pain. Worsening over several days EXAM: DG HIP (WITH OR WITHOUT PELVIS) 2-3V LEFT COMPARISON:  CT 131 18 FINDINGS: Hips are located. No evidence of pelvic fracture or sacral fracture. Dedicated view of the LEFT hip demonstrates no femoral neck fracture. Calcified leiomyoma. Diverticulosis of the LEFT of the colon. IMPRESSION: No pelvic fracture or LEFT hip fracture. Electronically Signed   By: Suzy Bouchard M.D.   On: 08/06/2016 19:47   Dg Femur Min 2 Views Left  Result Date: 08/06/2016 CLINICAL DATA:  LEFT femur pain EXAM: LEFT FEMUR 2 VIEWS COMPARISON:  None. FINDINGS: The LEFT hip is located. No femoral neck fracture. No evidence of fracture of the distal LEFT femur. There is degenerate osteophytosis of the LEFT knee joint. No acute findings of the LEFT femur. IMPRESSION: No acute findings of the LEFT femur. Electronically Signed   By: Suzy Bouchard M.D.   On: 08/06/2016 19:53    (Echo, Carotid, EGD, Colonoscopy, ERCP)    Subjective: Pt ambulating to bathroom, family want to take her home, declines SNF  Discharge Exam: Vitals:   08/14/16 0648 08/14/16 1000  BP: 118/62 (!) 118/58  Pulse: 88 85  Resp: 18 16  Temp: 98.3 F (36.8 C) 98.7 F (37.1 C)   Vitals:   08/13/16 2112 08/14/16 0648 08/14/16 1000 08/14/16 1106  BP: (!) 119/52 118/62 (!) 118/58   Pulse: 96 88 85   Resp: 18 18 16    Temp: 99.2 F (37.3 C) 98.3 F (36.8 C) 98.7 F (37.1 C)   TempSrc: Oral Oral Oral   SpO2: 96% 94% 96% 96%  Weight:      Height:        General exam: NAD, calm, comfortable Respiratory system:  No increased work of breathing. Cardiovascular system: S1 & S2 heard Gastrointestinal system: Abdomen is nondistended, soft and nontender. Normal bowel sounds heard. Central nervous system: Alert and oriented. No focal neurological deficits. Extremities: painful left hip but able to ambulate with walker  The results of significant diagnostics from this hospitalization (including imaging, microbiology, ancillary and laboratory) are listed below for reference.     Microbiology: No results found for this or any previous visit (from the past 240 hour(s)).   Labs: BNP (last 3 results)  Recent Labs  06/04/16 1120  BNP 123XX123   Basic Metabolic  Panel:  Recent Labs Lab 08/12/16 1901 08/13/16 0428  NA 131* 137  K 4.5 3.9  CL 98* 104  CO2 26 27  GLUCOSE 121* 131*  BUN 10 10  CREATININE 0.65 0.58  CALCIUM 8.4* 8.7*   Liver Function Tests: No results for input(s): AST, ALT, ALKPHOS, BILITOT, PROT, ALBUMIN in the last 168 hours. No results for input(s): LIPASE, AMYLASE in the last 168 hours. No results for input(s): AMMONIA in the last 168 hours. CBC:  Recent Labs Lab 08/12/16 1901 08/13/16 0428  WBC 69.1* 58.9*  NEUTROABS 5.5  --   HGB 10.7* 10.2*  HCT 33.9* 32.6*  MCV 70.2* 69.5*  PLT 146* 175   Cardiac Enzymes:  Recent Labs Lab 08/12/16 1901  CKTOTAL 31*   BNP: Invalid input(s): POCBNP CBG:  Recent Labs Lab 08/13/16 0838 08/14/16 0902  GLUCAP 127* 172*   D-Dimer No results for input(s): DDIMER in the last 72 hours. Hgb A1c No results for input(s): HGBA1C in the last 72 hours. Lipid Profile No results for input(s): CHOL, HDL, LDLCALC, TRIG, CHOLHDL, LDLDIRECT in the last 72 hours. Thyroid function studies No results for input(s): TSH, T4TOTAL, T3FREE, THYROIDAB in the last 72 hours.  Invalid input(s): FREET3 Anemia work up No results for input(s): VITAMINB12, FOLATE, FERRITIN, TIBC, IRON,  RETICCTPCT in the last 72 hours. Urinalysis    Component Value Date/Time   COLORURINE AMBER (A) 08/12/2016 2137   APPEARANCEUR CLOUDY (A) 08/12/2016 2137   LABSPEC 1.005 08/12/2016 2137   PHURINE 6.0 08/12/2016 2137   GLUCOSEU NEGATIVE 08/12/2016 2137   HGBUR LARGE (A) 08/12/2016 2137   BILIRUBINUR NEGATIVE 08/12/2016 2137   BILIRUBINUR neg 11/18/2015 1539   KETONESUR NEGATIVE 08/12/2016 2137   PROTEINUR NEGATIVE 08/12/2016 2137   UROBILINOGEN 0.2 11/18/2015 1539   UROBILINOGEN 0.2 02/12/2015 1116   NITRITE NEGATIVE 08/12/2016 2137   LEUKOCYTESUR LARGE (A) 08/12/2016 2137   Sepsis Labs Invalid input(s): PROCALCITONIN,  WBC,  LACTICIDVEN Microbiology No results found for this or any previous visit (from the past 240 hour(s)).   Time coordinating discharge: 32 minutes  SIGNED:  Irwin Brakeman, MD  [facility] 08/14/2016, 11:57 AM Pager   If 7PM-7AM, please contact night-coverage [email or web address] Password [email or web address]

## 2016-08-14 NOTE — Progress Notes (Signed)
[id number] Stefanie Henry,BSN,RN3,CCM  [id number] planning,  Needs RN, for safety home check, P.T. For ambulation and strengthening, Aide for assistance/text sent to [facility] rep.

## 2016-08-15 DIAGNOSIS — J4540 Moderate persistent asthma, uncomplicated: Secondary | ICD-10-CM | POA: Diagnosis not present

## 2016-08-15 DIAGNOSIS — G709 Myoneural disorder, unspecified: Secondary | ICD-10-CM | POA: Diagnosis not present

## 2016-08-15 DIAGNOSIS — D696 Thrombocytopenia, unspecified: Secondary | ICD-10-CM | POA: Diagnosis not present

## 2016-08-15 DIAGNOSIS — D509 Iron deficiency anemia, unspecified: Secondary | ICD-10-CM | POA: Diagnosis not present

## 2016-08-15 DIAGNOSIS — C9110 Chronic lymphocytic leukemia of B-cell type not having achieved remission: Secondary | ICD-10-CM | POA: Diagnosis not present

## 2016-08-15 DIAGNOSIS — Z7952 Long term (current) use of systemic steroids: Secondary | ICD-10-CM | POA: Diagnosis not present

## 2016-08-15 DIAGNOSIS — N390 Urinary tract infection, site not specified: Secondary | ICD-10-CM | POA: Diagnosis not present

## 2016-08-15 DIAGNOSIS — J449 Chronic obstructive pulmonary disease, unspecified: Secondary | ICD-10-CM | POA: Diagnosis not present

## 2016-08-15 DIAGNOSIS — Z7951 Long term (current) use of inhaled steroids: Secondary | ICD-10-CM | POA: Diagnosis not present

## 2016-08-15 DIAGNOSIS — M24152 Other articular cartilage disorders, left hip: Secondary | ICD-10-CM | POA: Diagnosis not present

## 2016-08-15 DIAGNOSIS — E871 Hypo-osmolality and hyponatremia: Secondary | ICD-10-CM | POA: Diagnosis not present

## 2016-08-15 DIAGNOSIS — K5904 Chronic idiopathic constipation: Secondary | ICD-10-CM | POA: Diagnosis not present

## 2016-08-17 ENCOUNTER — Telehealth: Payer: Self-pay

## 2016-08-17 ENCOUNTER — Telehealth: Payer: Self-pay | Admitting: Medical

## 2016-08-17 NOTE — Telephone Encounter (Signed)
Called patient to let them know that Dr. Nani Ravens has accepted them the patient and that a phone visit would not be possible, Left message for a return call to schedule appointment for Hospital follow up.

## 2016-08-17 NOTE — Telephone Encounter (Signed)
Called to to TCM  And schedule follow up appointment for patient. Per Son he would like to change providers but will have to review the providers we have in this office. States he will schedule patients hospital follow up after changing physicians.

## 2016-08-17 NOTE — Telephone Encounter (Signed)
OK to change?

## 2016-08-17 NOTE — Telephone Encounter (Signed)
[place] with me. Go ahead and schedule hospital follow up with Dr. Nani Ravens.

## 2016-08-17 NOTE — Telephone Encounter (Signed)
TCM call completed.  

## 2016-08-17 NOTE — Telephone Encounter (Signed)
Called patient to complete TCM . Per son they would like to change providers before scheduling. Will call back when they have decided. Received call from patients daughter who states they would like to change providers fron E. Saguier, PA-C to [facility]. [place], DO. Advised I would notify each provider and return call. Please advise.

## 2016-08-17 NOTE — Telephone Encounter (Signed)
Pt's daughter called in on pt's behalf. She says that pt  would like to switch providers from [facility] to [facility]. She says that pt would just prefer a MD. She says that pt also need to schedule a F/U appt.      [phone]

## 2016-08-17 NOTE — Telephone Encounter (Addendum)
Transition Care Management Follow-up Telephone Call  ADMISSION DATE:  08/12/2016  DISCHARGE DATE: 08/14/2016  How have you been since you were released from the hospital?  Per son patient states she continues to have pain in hip. Appetite poor  Do you understand why you were in the hospital? Yes per son  Do you understand the discharge instrcutions? Yes per son  Items Reviewed:  Medications reviewed: .Yes  Allergies reviewed: Yes  Dietary changes reviewed: Semi Solid  Patient concerns: Patient has poor appetite,problems transferring from 1st to 2nd floor in home.   Functional Questionnaire:   Activities of Daily Living (ADLs): Patienthas to have assistance with all ADL's at this time  Any transportation issues/concerns? No son will transport.  Confirmed importance and date/time of follow-up visits scheduled:  Yes  Confirmed with patient if condition begins to worsen call PCP or go to the ER. Yes    Patient was given the [place] line [phone]: Yes

## 2016-08-18 DIAGNOSIS — M24152 Other articular cartilage disorders, left hip: Secondary | ICD-10-CM | POA: Diagnosis not present

## 2016-08-18 DIAGNOSIS — N390 Urinary tract infection, site not specified: Secondary | ICD-10-CM | POA: Diagnosis not present

## 2016-08-18 DIAGNOSIS — J4540 Moderate persistent asthma, uncomplicated: Secondary | ICD-10-CM | POA: Diagnosis not present

## 2016-08-18 DIAGNOSIS — D509 Iron deficiency anemia, unspecified: Secondary | ICD-10-CM | POA: Diagnosis not present

## 2016-08-18 DIAGNOSIS — C9110 Chronic lymphocytic leukemia of B-cell type not having achieved remission: Secondary | ICD-10-CM | POA: Diagnosis not present

## 2016-08-18 DIAGNOSIS — K5904 Chronic idiopathic constipation: Secondary | ICD-10-CM | POA: Diagnosis not present

## 2016-08-18 NOTE — Care Management Note (Signed)
Case Management Note  Patient Details  Name: Stefanie Henry MRN: [id number] Date of Birth: [birth year]  Subjective/Objective:                    Action/Plan:   Expected Discharge Date:  08/14/16               Expected Discharge Plan:  [place]  In-House Referral:     Discharge planning Services  CM Consult  Post Acute Care Choice:    Choice offered to:  Patient, Adult Children  DME Arranged:    DME Agency:     HH Arranged:  RN [place] Agency:  [facility]  Status of Service:     If discussed at [place] of Stay Meetings, dates discussed:    Additional Comments:  Leeroy Cha, RN 08/18/2016, 9:28 AM

## 2016-08-18 NOTE — Telephone Encounter (Signed)
Left mesage regarding appointment.

## 2016-08-19 NOTE — Telephone Encounter (Signed)
Appointment scheduled.

## 2016-08-20 DIAGNOSIS — J4540 Moderate persistent asthma, uncomplicated: Secondary | ICD-10-CM | POA: Diagnosis not present

## 2016-08-20 DIAGNOSIS — N390 Urinary tract infection, site not specified: Secondary | ICD-10-CM | POA: Diagnosis not present

## 2016-08-20 DIAGNOSIS — C9110 Chronic lymphocytic leukemia of B-cell type not having achieved remission: Secondary | ICD-10-CM | POA: Diagnosis not present

## 2016-08-20 DIAGNOSIS — M24152 Other articular cartilage disorders, left hip: Secondary | ICD-10-CM | POA: Diagnosis not present

## 2016-08-20 DIAGNOSIS — D509 Iron deficiency anemia, unspecified: Secondary | ICD-10-CM | POA: Diagnosis not present

## 2016-08-20 DIAGNOSIS — K5904 Chronic idiopathic constipation: Secondary | ICD-10-CM | POA: Diagnosis not present

## 2016-08-21 ENCOUNTER — Encounter (HOSPITAL_BASED_OUTPATIENT_CLINIC_OR_DEPARTMENT_OTHER): Payer: Self-pay | Admitting: Emergency Medicine

## 2016-08-21 ENCOUNTER — Emergency Department (HOSPITAL_BASED_OUTPATIENT_CLINIC_OR_DEPARTMENT_OTHER)
Admission: EM | Admit: 2016-08-21 | Discharge: 2016-08-21 | Disposition: A | Discharge status: Home / Self Care | Payer: Medicare Other | Attending: Emergency Medicine | Admitting: Emergency Medicine

## 2016-08-21 ENCOUNTER — Emergency Department (HOSPITAL_BASED_OUTPATIENT_CLINIC_OR_DEPARTMENT_OTHER): Payer: Medicare Other

## 2016-08-21 DIAGNOSIS — J449 Chronic obstructive pulmonary disease, unspecified: Secondary | ICD-10-CM | POA: Diagnosis not present

## 2016-08-21 DIAGNOSIS — R100 Acute abdomen: Secondary | ICD-10-CM | POA: Diagnosis not present

## 2016-08-21 DIAGNOSIS — R1084 Generalized abdominal pain: Secondary | ICD-10-CM | POA: Diagnosis not present

## 2016-08-21 DIAGNOSIS — E871 Hypo-osmolality and hyponatremia: Secondary | ICD-10-CM | POA: Insufficient documentation

## 2016-08-21 DIAGNOSIS — K5903 Drug induced constipation: Secondary | ICD-10-CM

## 2016-08-21 DIAGNOSIS — J45909 Unspecified asthma, uncomplicated: Secondary | ICD-10-CM | POA: Insufficient documentation

## 2016-08-21 DIAGNOSIS — Z79899 Other long term (current) drug therapy: Secondary | ICD-10-CM | POA: Diagnosis not present

## 2016-08-21 DIAGNOSIS — D72820 Lymphocytosis (symptomatic): Secondary | ICD-10-CM | POA: Diagnosis not present

## 2016-08-21 DIAGNOSIS — R109 Unspecified abdominal pain: Secondary | ICD-10-CM | POA: Diagnosis present

## 2016-08-21 DIAGNOSIS — I509 Heart failure, unspecified: Secondary | ICD-10-CM | POA: Diagnosis not present

## 2016-08-21 DIAGNOSIS — K5900 Constipation, unspecified: Secondary | ICD-10-CM | POA: Diagnosis not present

## 2016-08-21 DIAGNOSIS — R1030 Lower abdominal pain, unspecified: Secondary | ICD-10-CM | POA: Diagnosis not present

## 2016-08-21 LAB — CBC WITH DIFFERENTIAL/PLATELET
Band Neutrophils: 0 %
Basophils Absolute: 0 10*3/uL (ref 0.0–0.1)
Basophils Relative: 0 %
Blasts: 0 %
Eosinophils Absolute: 0.9 10*3/uL — ABNORMAL HIGH (ref 0.0–0.7)
Eosinophils Relative: 1 %
HCT: 32.5 % — ABNORMAL LOW (ref 36.0–46.0)
Hemoglobin: 10.3 g/dL — ABNORMAL LOW (ref 12.0–15.0)
Lymphocytes Relative: 87 %
Lymphs Abs: 78.7 10*3/uL — ABNORMAL HIGH (ref 0.7–4.0)
MCH: 22.1 pg — ABNORMAL LOW (ref 26.0–34.0)
MCHC: 31.7 g/dL (ref 30.0–36.0)
MCV: 69.6 fL — ABNORMAL LOW (ref 78.0–100.0)
Metamyelocytes Relative: 0 %
Monocytes Absolute: 0 10*3/uL — ABNORMAL LOW (ref 0.1–1.0)
Monocytes Relative: 0 %
Myelocytes: 0 %
Neutro Abs: 10.9 10*3/uL — ABNORMAL HIGH (ref 1.7–7.7)
Neutrophils Relative %: 12 %
Other: 0 %
Platelets: 164 10*3/uL (ref 150–400)
Promyelocytes Absolute: 0 %
RBC: 4.67 MIL/uL (ref 3.87–5.11)
RDW: 13.9 % (ref 11.5–15.5)
WBC: 90.5 10*3/uL (ref 4.0–10.5)
nRBC: 0 /100{WBCs}

## 2016-08-21 LAB — COMPREHENSIVE METABOLIC PANEL WITH GFR
ALT: 16 U/L (ref 14–54)
AST: 16 U/L (ref 15–41)
Albumin: 3.5 g/dL (ref 3.5–5.0)
Alkaline Phosphatase: 107 U/L (ref 38–126)
Anion gap: 7 (ref 5–15)
BUN: 9 mg/dL (ref 6–20)
CO2: 26 mmol/L (ref 22–32)
Calcium: 8.3 mg/dL — ABNORMAL LOW (ref 8.9–10.3)
Chloride: 94 mmol/L — ABNORMAL LOW (ref 101–111)
Creatinine, Ser: 0.61 mg/dL (ref 0.44–1.00)
GFR calc Af Amer: >60 mL/min
GFR calc non Af Amer: >60 mL/min
Glucose, Bld: 116 mg/dL — ABNORMAL HIGH (ref 65–99)
Potassium: 3.4 mmol/L — ABNORMAL LOW (ref 3.5–5.1)
Sodium: 127 mmol/L — ABNORMAL LOW (ref 135–145)
Total Bilirubin: 0.6 mg/dL (ref 0.3–1.2)
Total Protein: 5.5 g/dL — ABNORMAL LOW (ref 6.5–8.1)

## 2016-08-21 LAB — URINALYSIS, ROUTINE W REFLEX MICROSCOPIC
BILIRUBIN URINE: NEGATIVE
Glucose, UA: NEGATIVE mg/dL
KETONES UR: NEGATIVE mg/dL
Nitrite: NEGATIVE
PROTEIN: NEGATIVE mg/dL
Specific Gravity, Urine: 1.005 (ref 1.005–1.030)
pH: 7 (ref 5.0–8.0)

## 2016-08-21 LAB — URINALYSIS, MICROSCOPIC (REFLEX)

## 2016-08-21 LAB — I-STAT CG4 LACTIC ACID, ED: Lactic Acid, Venous: 0.61 mmol/L (ref 0.5–1.9)

## 2016-08-21 LAB — LIPASE, BLOOD: LIPASE: 25 U/L (ref 11–51)

## 2016-08-21 MED ORDER — NALOXEGOL OXALATE 12.5 MG PO TABS: 12.5000 mg | ORAL_TABLET | Freq: Every day | ORAL | 0 refills | Status: DC

## 2016-08-21 MED ORDER — ONDANSETRON HCL 4 MG/2ML IJ SOLN
4.0000 mg | Freq: Once | INTRAMUSCULAR | Status: AC
  Administered 2016-08-21: 4 mg via INTRAVENOUS
  Filled 2016-08-21: qty 2

## 2016-08-21 MED ORDER — FLEET ENEMA 7-19 GM/118ML RE ENEM
1.0000 | ENEMA | Freq: Once | RECTAL | Status: AC
Start: 2016-08-21 — End: 2016-08-21
  Administered 2016-08-21: 1 via RECTAL
  Filled 2016-08-21: qty 1

## 2016-08-21 MED ORDER — MORPHINE SULFATE (PF) 2 MG/ML IV SOLN: 2.0000 mg | Freq: Once | INTRAVENOUS | Status: DC

## 2016-08-21 MED ORDER — IOPAMIDOL (ISOVUE-300) INJECTION 61%
100.0000 mL | Freq: Once | INTRAVENOUS | Status: AC | PRN
  Administered 2016-08-21: 100 mL via INTRAVENOUS

## 2016-08-21 MED ORDER — MORPHINE SULFATE (PF) 2 MG/ML IV SOLN
2.0000 mg | INTRAVENOUS | Status: DC | PRN
  Administered 2016-08-21: 2 mg via INTRAVENOUS
  Filled 2016-08-21: qty 1

## 2016-08-21 MED ORDER — SODIUM CHLORIDE 0.9 % IV BOLUS (SEPSIS)
1000.0000 mL | Freq: Once | INTRAVENOUS | Status: AC
  Administered 2016-08-21: 1000 mL via INTRAVENOUS

## 2016-08-21 NOTE — ED Notes (Signed)
ED Provider at bedside. 

## 2016-08-21 NOTE — ED Triage Notes (Addendum)
Pt presents via [facility] c/o lower abd pain and constipation x 5 days. Pt takes pain medications for chronic pain and also takes stool softeners daily.

## 2016-08-21 NOTE — ED Provider Notes (Signed)
[facility] DEPT [facility] Provider Note   CSN: [id number] Arrival date & time: 08/21/16  1058     History   Chief Complaint Chief Complaint  Patient presents with  . Abdominal Pain    HPI Stefanie Henry is a 83 y.o. female.  HPI 82 year old female who presents with abdominal pain. History is provided by patient's son who is her caregiver. She has a history of CLL on prednisone and observation, COPD, diverticulosis, cholelithiasis. She has chronic constipation, and recently hospitalized for left hip pain due to labral tear. Was started on narcotic medications and even in the hospital with having issues having a bowel movement. Has been taking hydrocodone at home for pain control. Also taking Senokot for bowel regimen. She has not had solid bowel movement in 7 days per patient's son. She has been complaining of low abdominal pain. She has had decreased by mouth intake due to nausea but has not had vomiting. Has not had fevers or chills. Currently taking Bactrim for UTI that was discovered on last admission, and still has 5 more days to go.  Past Medical History:  Diagnosis Date  . Anemia, iron deficiency 06/06/2012  . Arthritis   . Asthma   . Blood transfusion without reported diagnosis   . Cataract   . CHF (congestive heart failure) ([place]) 12/20/2015  . Chronic lymphatic leukemia ([place])   . COPD (chronic obstructive pulmonary disease) ([place])   . Diverticulitis   . Gallstones   . GERD (gastroesophageal reflux disease)   . Neuromuscular disorder ([place])   . Shingles     Patient Active Problem List   Diagnosis Date Noted  . Labral tear of left hip joint 08/13/2016  . Tear of left acetabular labrum 08/12/2016  . Unable to bear weight 08/12/2016  . Constipation   . Left hip pain   . Dependent edema 12/18/2015  . UTI (urinary tract infection) 12/13/2015  . Chronic respiratory failure ([place]) 12/02/2015  . Asthma, moderate persistent 08/27/2015  . Diverticulitis of colon 12/04/2014  .  Abscess   . Colovesical fistula   . Early satiety   . Gastric polyps   . Diverticulitis of large intestine with abscess without bleeding   . Malnutrition of moderate degree ([place]) 11/02/2014  . Abnormal LFTs (liver function tests)   . Dysphagia   . Abscess, abdomen ([place]) 10/16/2014  . Hyponatremia 10/16/2014  . Diverticulitis of large intestine with perforation and abscess without bleeding 10/16/2014  . Thrombocytopenia ([place]) 10/16/2014  . Dyspnea 10/05/2014  . Midline low back pain without sciatica   . Back pain 06/09/2014  . Fall   . CLL (chronic lymphocytic leukemia) ([place])   . Gallstones without obstruction of gallbladder   . Lumbar compression fracture ([place]) 06/08/2014  . Chronic lymphocytic leukemia ([place]) 06/06/2012  . Anemia, iron deficiency 06/06/2012    Past Surgical History:  Procedure Laterality Date  . ESOPHAGOGASTRODUODENOSCOPY N/A 11/07/2014   Procedure: ESOPHAGOGASTRODUODENOSCOPY (EGD);  Surgeon: Gatha Mayer, MD;  Location: [facility];  Service: Endoscopy;  Laterality: N/A;  . none      OB History    No data available       Home Medications    Prior to Admission medications   Medication Sig Start Date End Date Taking? Authorizing Provider  albuterol (PROVENTIL) (2.5 MG/3ML) 0.083% nebulizer solution Take 3 mLs (2.5 mg total) by nebulization 2 (two) times daily. 12/02/15   Tammy S Parrett, NP  albuterol (VENTOLIN HFA) 108 (90 Base) MCG/ACT inhaler INHALE 1-2 PUFFS INTO  THE LUNGS EVERY 6 HOURS AS NEEDED FOR WHEEZING OR SHORTNESS OF BREATH. Patient taking differently: Inhale 1-2 puffs into the lungs every 6 (six) hours as needed for wheezing or shortness of breath.  12/02/15   Tammy S Parrett, NP  Ascorbic Acid (VITAMIN C) 1000 MG tablet Take 1,000 mg by mouth daily.    Historical Provider, MD  calcitonin, salmon, (MIACALCIN/FORTICAL) 200 UNIT/ACT nasal spray Place 1 spray into alternate nostrils daily. 11/18/15   Percell Miller Saguier, PA-C  gabapentin (NEURONTIN) 100  MG capsule TAKE 2 CAPSULES BY MOUTH 3 TIMES A DAY AS NEEDED FOR NERVE PAIN 07/13/16   Mackie Pai, PA-C  GARLIC PO Take 1 tablet by mouth daily.    Historical Provider, MD  HYDROcodone-acetaminophen (NORCO/VICODIN) 5-325 MG tablet Take 1/2 -1 tablets every 6 hours as needed for severe pain 08/14/16   Clanford Marisa Hua, MD  hydrocortisone (ANUSOL-HC) 25 MG suppository Place 1 suppository (25 mg total) rectally 2 (two) times daily. Patient taking differently: Place 25 mg rectally 2 (two) times daily as needed for hemorrhoids.  12/15/15   Lavina Hamman, MD  hyoscyamine (LEVSIN SL) 0.125 MG SL tablet PLACE 1 TABLET (0.125 MG TOTAL) UNDER THE TONGUE EVERY 8 (EIGHT) HOURS AS NEEDED. Patient taking differently: Take 0.125 mg by mouth every 8 (eight) hours as needed for cramping. . 11/18/15   Edward Saguier, PA-C  mometasone-formoterol (DULERA) 200-5 MCG/ACT AERO Inhale 2 puffs into the lungs 2 (two) times daily. 07/13/16   Rigoberto Noel, MD  Multiple Vitamin (MULTIVITAMIN WITH MINERALS) TABS tablet Take 1 tablet by mouth daily.    Historical Provider, MD  naloxegol oxalate (MOVANTIK) 12.5 MG TABS tablet Take 1 tablet (12.5 mg total) by mouth daily. 08/21/16   Forde Dandy, MD  NEOMYCIN-POLYMYXIN-HYDROCORTISONE (CORTISPORIN) 1 % SOLN otic solution PLACE 3 DROPS INTO THE LEFT EAR EVERY 8 (EIGHT) HOURS. 08/12/15   Mackie Pai, PA-C  omeprazole (PRILOSEC) 20 MG capsule TAKE ONE CAPSULE BY MOUTH EVERY DAY 07/20/16   Mackie Pai, PA-C  predniSONE (DELTASONE) 10 MG tablet TAKE 1 TABLET BY MOUTH EVERY DAY 08/03/16   [name], MD  psyllium (METAMUCIL) 58.6 % packet Take 1 packet by mouth daily. 12/15/15   Lavina Hamman, MD  Simethicone (GAS-X PO) Take 2 capsules by mouth daily as needed (for gas).     Historical Provider, MD  sucralfate (CARAFATE) 1 g tablet TAKE 2 TABLETS IN THE MORNING AND AT BEDTIME 06/23/16   Ladene Artist, MD  vitamin B-12 (CYANOCOBALAMIN) 500 MCG tablet Take 500 mcg by mouth daily.     Historical Provider, MD    Family History Family History  Problem Relation Age of Onset  . Diabetes Father   . Stomach cancer Sister   . Gallbladder disease Mother   . Colon cancer Neg Hx   . Colon polyps Neg Hx   . Esophageal cancer Neg Hx     Social History Social History  Substance Use Topics  . Smoking status: Never Smoker  . Smokeless tobacco: Never Used  . Alcohol use No     Allergies   Citrus; Other; Augmentin [amoxicillin-pot clavulanate]; Ibuprofen; and Keflex [cephalexin]   Review of Systems Review of Systems 10/14 systems reviewed and are negative other than those stated in the HPI   Physical Exam Updated Vital Signs BP 143/80 (BP Location: Right Arm)   Pulse 101   Temp 97.4 F (36.3 C) (Oral)   Resp 20   Ht 4\' 11"  (1.499  m)   Wt 142 lb (64.4 kg)   SpO2 97%   BMI 28.68 kg/m   Physical Exam Physical Exam  Nursing note and vitals reviewed. Constitutional: non-toxic, and in no acute distress Head: Normocephalic and atraumatic.  Mouth/Throat: Oropharynx is clear and moist.  Neck: Normal range of motion. Neck supple.  Cardiovascular: Normal rate and regular rhythm.   Pulmonary/Chest: Effort normal and breath sounds normal.  Abdominal: Soft. There is mild distention with low abdominal tenderness. There is no rebound and no guarding.  rectal exam with external hemorrhoids. Soft stool palpated in proximal rectum, but no hard stool or impacted stool.   Musculoskeletal: Normal range of motion.  Neurological: Alert, no facial droop, fluent speech, moves all extremities symmetrically Skin: Skin is warm and dry.  Psychiatric: Cooperative   ED Treatments / Results  Labs (all labs ordered are listed, but only abnormal results are displayed) Labs Reviewed  CBC WITH DIFFERENTIAL/PLATELET - Abnormal; Notable for the following:       Result Value   WBC 90.5 (*)    Hemoglobin 10.3 (*)    HCT 32.5 (*)    MCV 69.6 (*)    MCH 22.1 (*)    Neutro Abs 10.9  (*)    Lymphs Abs 78.7 (*)    Monocytes Absolute 0.0 (*)    Eosinophils Absolute 0.9 (*)    All other components within normal limits  COMPREHENSIVE METABOLIC PANEL - Abnormal; Notable for the following:    Sodium 127 (*)    Potassium 3.4 (*)    Chloride 94 (*)    Glucose, Bld 116 (*)    Calcium 8.3 (*)    Total Protein 5.5 (*)    All other components within normal limits  URINALYSIS, ROUTINE W REFLEX MICROSCOPIC - Abnormal; Notable for the following:    APPearance HAZY (*)    Hgb urine dipstick MODERATE (*)    Leukocytes, UA MODERATE (*)    All other components within normal limits  URINALYSIS, MICROSCOPIC (REFLEX) - Abnormal; Notable for the following:    Bacteria, UA FEW (*)    Squamous Epithelial / LPF 0-5 (*)    All other components within normal limits  LIPASE, BLOOD  PATHOLOGIST SMEAR REVIEW  I-STAT CG4 LACTIC ACID, ED  I-STAT CG4 LACTIC ACID, ED    EKG  EKG Interpretation None       Radiology Ct Abdomen Pelvis W Contrast  Result Date: 08/21/2016 CLINICAL DATA:  Lower abdominal pain.  Nausea.  Constipation. EXAM: CT ABDOMEN AND PELVIS WITH CONTRAST TECHNIQUE: Multidetector CT imaging of the abdomen and pelvis was performed using the standard protocol following bolus administration of intravenous contrast. CONTRAST:  168mL ISOVUE-300 IOPAMIDOL (ISOVUE-300) INJECTION 61% COMPARISON:  07/29/2016 CT abdomen/pelvis. FINDINGS: Lower chest: Stable near complete right middle lobe atelectasis. Oral contrast level is seen in the lower thoracic esophagus. Hepatobiliary: Normal liver with no liver mass. Nondistended gallbladder. Known non radiopaque cholelithiasis. No gallbladder wall thickening or pericholecystic fluid. Mild diffuse intrahepatic biliary ductal dilatation and mildly dilated common bile duct (9 mm diameter), not appreciably changed back to the 10/19/2014 MRI study. No radiopaque choledocholithiasis. Pancreas: Normal, with no mass or duct dilation. Spleen: Normal size.  No mass. Adrenals/Urinary Tract: Normal adrenals. No hydronephrosis. Simple 1.7 cm lateral interpolar right renal cyst. Additional subcentimeter hypodense renal cortical lesions in both kidneys are too small to characterize and are stable back to 10/19/2014 MRI, suggesting benign renal cysts. There is nondependent gas in the bladder. No bladder wall  thickening or bladder stones. Stomach/Bowel: Tiny hiatal hernia. Otherwise grossly normal stomach. Normal caliber small bowel with no small bowel wall thickening. Normal appendix. Oral contrast progresses to the pelvic small bowel. Moderate colonic stool in the right colon. Moderate sigmoid diverticulosis, with no colonic wall thickening or pericolonic fat stranding. Prominent rectal distention by stool up to the 7.5 cm diameter with associated mild mid to lower rectal wall thickening and perirectal fat stranding, new since 07/29/2016, suggesting rectal fecal impaction with stercoral colitis. Vascular/Lymphatic: Atherosclerotic nonaneurysmal abdominal aorta. Patent portal, splenic, hepatic and renal veins. No pathologically enlarged lymph nodes in the abdomen or pelvis. Reproductive: Stable mildly enlarged myomatous uterus with coarsely calcified degenerated fibroids. No adnexal mass. Other: No pneumoperitoneum, ascites or focal fluid collection. Stable tiny fat containing umbilical hernia. Musculoskeletal: No aggressive appearing focal osseous lesions. Marked thoracolumbar spondylosis. Stable moderate L1 chronic compression fracture. Evolving mild subacute L4 vertebral compression fracture. New mild L3 vertebral compression fracture. IMPRESSION: 1. New rectal fecal impaction with stercoral colitis. No free air. No small bowel obstruction. 2. New mild L3 vertebral compression fracture. Evolving mild subacute L4 vertebral compression fracture. Stable chronic moderate L1 vertebral compression fracture. 3. Additional findings include aortic atherosclerosis, tiny hiatal  hernia, oral contrast in the lower thoracic esophagus suggesting esophageal dysmotility and/or gastroesophageal reflux, cholelithiasis, chronic mild biliary ductal dilatation, chronic near complete right middle lobe atelectasis, moderate sigmoid diverticulosis and myomatous uterus. Electronically Signed   By: Ilona Sorrel M.D.   On: 08/21/2016 14:16    Procedures Procedures (including critical care time)  Medications Ordered in ED Medications  morphine 2 MG/ML injection 2 mg (2 mg Intravenous Given 08/21/16 1201)  sodium chloride 0.9 % bolus 1,000 mL (0 mLs Intravenous Stopped 08/21/16 1416)  ondansetron (ZOFRAN) injection 4 mg (4 mg Intravenous Given 08/21/16 1201)  iopamidol (ISOVUE-300) 61 % injection 100 mL (100 mLs Intravenous Contrast Given 08/21/16 1337)  sodium phosphate (FLEET) 7-19 GM/118ML enema 1 enema (1 enema Rectal Given 08/21/16 1500)     Initial Impression / Assessment and Plan / ED Course  I have reviewed the triage vital signs and the nursing notes.  Pertinent labs & imaging results that were available during my care of the patient were reviewed by me and considered in my medical decision making (see chart for details).     83 year old female who presents with constipation x 7 days with low abdominal pain. She is non-toxic and in no acute distress. Vital signs stable. She has soft abdomen, with lower abdominal tenderness. Rectal exam with soft stool palpated in the proximal rectum. No hard fecal impaction to disimpact at this time. Blood work with some hyponatremia of 127 likely due to decreased PO intake. No confusion or AMS.   CT abdomen pelvis obtained for evaluation of diverticulitis, volvulus, colitis, bowel perforation, or other serious intra-abdominal process. CT visualized and shows evidence of fecal impaction with associated inflammation 2/2 to impaction. Fleet enema given as unable to reach area of impaction by hand. Enema able to bring stool to rectum but too soft to  manually disimpact. Patient unable to pass much stools as she states she is too weak right now.  Discussed potential admission with Dr. Ree Kida, who did not feel patient required hospitalization if we could avoid it. I spoke with patient's son afterward. He felt comfortable managing her constipation at home and will encourage pO intake. Will start Movantik for opiate induced constipation, per Dr. Ree Kida. Discussed strict return instructions. Son expressed understanding of  all d/c instructions and felt comfortable with plan of care.  Final Clinical Impressions(s) / ED Diagnoses   Final diagnoses:  Drug-induced constipation  Hyponatremia    New Prescriptions New Prescriptions   NALOXEGOL OXALATE (MOVANTIK) 12.5 MG TABS TABLET    Take 1 tablet (12.5 mg total) by mouth daily.     Forde Dandy, MD 08/21/16 [phone]

## 2016-08-21 NOTE — ED Notes (Signed)
[facility] contacted for transport home per family request

## 2016-08-21 NOTE — ED Notes (Signed)
Lactic acid not performed as pt is discharged.

## 2016-08-21 NOTE — ED Notes (Signed)
Pt assisted to bedside commode. Minimal stool noted after 15 min. Pt assisted back to bed. EDP in for reevaluation.

## 2016-08-21 NOTE — Discharge Instructions (Signed)
Your CT shows severe constipation. You are prescribed Movantik for your constipation, while you are taking hydrocodone. Continue with sennakot.   Make sure you are eating and drinking well  Return without fail for worsening symptoms, including fever, confusion, vomiting, worsening pain, or any other symptoms concerning to you.  Please see primary care doctor on Monday for recheck.

## 2016-08-22 NOTE — Telephone Encounter (Signed)
Recent ED visit again. Chart states appointment was set for patient but message was left. Please call patient family and try to talk with someone/confirm that they got message to establish care with Dr. Nani Ravens and follow up from recent ED evaluation.

## 2016-08-24 ENCOUNTER — Telehealth (INDEPENDENT_AMBULATORY_CARE_PROVIDER_SITE_OTHER): Payer: Self-pay | Admitting: Orthopaedic Surgery

## 2016-08-24 DIAGNOSIS — M24152 Other articular cartilage disorders, left hip: Secondary | ICD-10-CM | POA: Diagnosis not present

## 2016-08-24 DIAGNOSIS — D509 Iron deficiency anemia, unspecified: Secondary | ICD-10-CM | POA: Diagnosis not present

## 2016-08-24 DIAGNOSIS — N390 Urinary tract infection, site not specified: Secondary | ICD-10-CM | POA: Diagnosis not present

## 2016-08-24 DIAGNOSIS — C9110 Chronic lymphocytic leukemia of B-cell type not having achieved remission: Secondary | ICD-10-CM | POA: Diagnosis not present

## 2016-08-24 DIAGNOSIS — K5904 Chronic idiopathic constipation: Secondary | ICD-10-CM | POA: Diagnosis not present

## 2016-08-24 DIAGNOSIS — J4540 Moderate persistent asthma, uncomplicated: Secondary | ICD-10-CM | POA: Diagnosis not present

## 2016-08-24 NOTE — Telephone Encounter (Signed)
Patient has Hospital F/U scheduled with Dr. Nani Ravens on Wed, 08/26/16, Percell Miller aware/SLS 02/26

## 2016-08-24 NOTE — Telephone Encounter (Signed)
[name] from [facility] requesting an OK for a referral for palliative care for pt. Family is receptive to this.  [phone]

## 2016-08-25 ENCOUNTER — Telehealth: Payer: Self-pay

## 2016-08-25 DIAGNOSIS — C9110 Chronic lymphocytic leukemia of B-cell type not having achieved remission: Secondary | ICD-10-CM | POA: Diagnosis not present

## 2016-08-25 DIAGNOSIS — N390 Urinary tract infection, site not specified: Secondary | ICD-10-CM | POA: Diagnosis not present

## 2016-08-25 DIAGNOSIS — M24152 Other articular cartilage disorders, left hip: Secondary | ICD-10-CM | POA: Diagnosis not present

## 2016-08-25 DIAGNOSIS — J4540 Moderate persistent asthma, uncomplicated: Secondary | ICD-10-CM | POA: Diagnosis not present

## 2016-08-25 DIAGNOSIS — D509 Iron deficiency anemia, unspecified: Secondary | ICD-10-CM | POA: Diagnosis not present

## 2016-08-25 DIAGNOSIS — K5904 Chronic idiopathic constipation: Secondary | ICD-10-CM | POA: Diagnosis not present

## 2016-08-25 LAB — PATHOLOGIST SMEAR REVIEW

## 2016-08-25 NOTE — Telephone Encounter (Signed)
Please advise 

## 2016-08-25 NOTE — Telephone Encounter (Signed)
yes

## 2016-08-25 NOTE — Telephone Encounter (Signed)
Patients son called this am. States patient has hospital follow up appointment tomorrow and he feels patient will not be able to come. States patient still has pain and takes pain medication which causes her to have constipation. Son says patient taken to ED and had CT scan done and Enema  States patien id fomgiven which helped some. States patient did not have blockage or twisting of the intestines. States she currently has seepage of feces from around the impaction and this is why she cannot be brought in. Patient is being seen by [facility] nurse who comes to the home. Patients son would like to for Dr. Nani Ravens to speak with [facility] regarding patient condition. Advised son that if condition worsens to take patinet nearest ED. Patiens son advised.

## 2016-08-25 NOTE — Telephone Encounter (Signed)
Patients son would like for Dr. Nani Ravens to speak with [place] Nurse Garth Schlatter. [phone]

## 2016-08-25 NOTE — Telephone Encounter (Signed)
I will not as I have never met the patient. If Stefanie Henry is willing, I will defer to him. If there are certain questions that can be answered by having access to her chart, someone else from our office team can contact the home health nurse. TY.

## 2016-08-26 ENCOUNTER — Telehealth: Payer: Self-pay | Admitting: Medical

## 2016-08-26 ENCOUNTER — Inpatient Hospital Stay: Payer: Medicare Other | Admitting: Family Medicine

## 2016-08-26 NOTE — Telephone Encounter (Addendum)
[facility]) called this am requesting Pallative referral for patient. Per E. Saguier this has already been placed. After review this referral was placed on 08/12/16. [name] with [facility] notified. Appointment for [facility] Follow up cancelled for today per family.

## 2016-08-26 NOTE — Telephone Encounter (Signed)
Caller name: Burman Nieves Relationship to patient: [place] Can be reached: [phone] Pharmacy:  Reason for call: [facility] needs a call back to find out if it is okay for them to call in Palliative Care for patient.

## 2016-08-26 NOTE — Telephone Encounter (Signed)
LMOM okay per Erlinda Hong

## 2016-08-26 NOTE — Telephone Encounter (Signed)
Spoke with E. Saguier,PA-c. Referral for Pallative care done on 08/12/16. Notified [name] with [facility]. Appointment cancelled for Hospital follow up today with Dr. Nani Ravens. Per son patient is not able to come in to office.

## 2016-08-26 NOTE — Telephone Encounter (Signed)
Palliative care referral was alreay place. See [facility] [place] note.

## 2016-08-27 ENCOUNTER — Encounter (HOSPITAL_COMMUNITY): Payer: Self-pay

## 2016-08-27 ENCOUNTER — Emergency Department (HOSPITAL_COMMUNITY): Payer: Medicare Other

## 2016-08-27 ENCOUNTER — Other Ambulatory Visit: Payer: Self-pay | Admitting: Gastroenterology

## 2016-08-27 ENCOUNTER — Emergency Department (HOSPITAL_COMMUNITY)
Admission: EM | Admit: 2016-08-27 | Discharge: 2016-08-27 | Disposition: A | Discharge status: Home / Self Care | Payer: Medicare Other | Attending: Emergency Medicine | Admitting: Emergency Medicine

## 2016-08-27 DIAGNOSIS — R1900 Intra-abdominal and pelvic swelling, mass and lump, unspecified site: Secondary | ICD-10-CM | POA: Diagnosis not present

## 2016-08-27 DIAGNOSIS — I509 Heart failure, unspecified: Secondary | ICD-10-CM | POA: Insufficient documentation

## 2016-08-27 DIAGNOSIS — Z79899 Other long term (current) drug therapy: Secondary | ICD-10-CM | POA: Insufficient documentation

## 2016-08-27 DIAGNOSIS — K5900 Constipation, unspecified: Secondary | ICD-10-CM | POA: Diagnosis not present

## 2016-08-27 DIAGNOSIS — J449 Chronic obstructive pulmonary disease, unspecified: Secondary | ICD-10-CM | POA: Diagnosis not present

## 2016-08-27 DIAGNOSIS — R1084 Generalized abdominal pain: Secondary | ICD-10-CM | POA: Diagnosis not present

## 2016-08-27 DIAGNOSIS — R109 Unspecified abdominal pain: Secondary | ICD-10-CM | POA: Diagnosis not present

## 2016-08-27 LAB — BASIC METABOLIC PANEL
ANION GAP: 6 (ref 5–15)
BUN: 6 mg/dL (ref 6–20)
CALCIUM: 8 mg/dL — AB (ref 8.9–10.3)
CO2: 27 mmol/L (ref 22–32)
Chloride: 98 mmol/L — ABNORMAL LOW (ref 101–111)
Creatinine, Ser: 0.54 mg/dL (ref 0.44–1.00)
GFR calc Af Amer: >60 mL/min (ref >60)
GLUCOSE: 107 mg/dL — AB (ref 65–99)
POTASSIUM: 4.1 mmol/L (ref 3.5–5.1)
SODIUM: 131 mmol/L — AB (ref 135–145)

## 2016-08-27 LAB — CBC
HCT: 30.8 % — ABNORMAL LOW (ref 36.0–46.0)
Hemoglobin: 9.7 g/dL — ABNORMAL LOW (ref 12.0–15.0)
MCH: 22 pg — ABNORMAL LOW (ref 26.0–34.0)
MCHC: 31.5 g/dL (ref 30.0–36.0)
MCV: 69.8 fL — AB (ref 78.0–100.0)
Platelets: 118 10*3/uL — ABNORMAL LOW (ref 150–400)
RBC: 4.41 MIL/uL (ref 3.87–5.11)
RDW: 14.1 % (ref 11.5–15.5)
WBC: 60.4 10*3/uL — AB (ref 4.0–10.5)

## 2016-08-27 MED ORDER — POLYETHYLENE GLYCOL 3350 17 GM/SCOOP PO POWD: 17.0000 g | Freq: Every day | ORAL | 0 refills | Status: AC

## 2016-08-27 MED ORDER — NALOXEGOL OXALATE 12.5 MG PO TABS: 12.5000 mg | ORAL_TABLET | Freq: Every day | ORAL | 0 refills | Status: DC

## 2016-08-27 MED ORDER — NALOXEGOL OXALATE 12.5 MG PO TABS
12.5000 mg | ORAL_TABLET | Freq: Once | ORAL | Status: AC
  Administered 2016-08-27: 12.5 mg via ORAL
  Filled 2016-08-27: qty 1

## 2016-08-27 MED ORDER — ONDANSETRON HCL 4 MG/2ML IJ SOLN
4.0000 mg | Freq: Once | INTRAMUSCULAR | Status: AC
  Administered 2016-08-27: 4 mg via INTRAVENOUS
  Filled 2016-08-27: qty 2

## 2016-08-27 MED ORDER — SODIUM CHLORIDE 0.9 % IV BOLUS (SEPSIS)
1000.0000 mL | Freq: Once | INTRAVENOUS | Status: AC
  Administered 2016-08-27: 1000 mL via INTRAVENOUS

## 2016-08-27 MED ORDER — POLYETHYLENE GLYCOL 3350 17 G PO PACK
17.0000 g | PACK | Freq: Every day | ORAL | Status: DC
  Administered 2016-08-27: 17 g via ORAL
  Filled 2016-08-27: qty 1

## 2016-08-27 NOTE — ED Notes (Signed)
Bed: WA08 Expected date:  Expected time:  Means of arrival:  Comments: ems 

## 2016-08-27 NOTE — ED Provider Notes (Signed)
[facility] DEPT Provider Note   CSN: [id number] Arrival date & time: 08/27/16  0859     History   Chief Complaint Chief Complaint  Patient presents with  . Constipation  . Abdominal Pain    HPI Stefanie Henry is a 83 y.o. female.  HPI Patient presents emergency department complaining of worsening constipation.  She was recently seen in the ER and prescribed medications.  She has not filled these medications yet.  She still feels like she is have a bowel movement.  Family reports she's been straining without success.  The patient feels like her belly is more swollen.  No history of bowel obstruction.  Report that she's had some liquid stool.  She is continuing Senokot at this time.  She was recently started on hydrocodone after an orthopedic procedure.  No fevers or chills.  Nausea without vomiting.  No other complaints   Past Medical History:  Diagnosis Date  . Anemia, iron deficiency 06/06/2012  . Arthritis   . Asthma   . Blood transfusion without reported diagnosis   . Cataract   . CHF (congestive heart failure) ([place]) 12/20/2015  . Chronic lymphatic leukemia ([place])   . COPD (chronic obstructive pulmonary disease) ([place])   . Diverticulitis   . Gallstones   . GERD (gastroesophageal reflux disease)   . Neuromuscular disorder ([place])   . Shingles     Patient Active Problem List   Diagnosis Date Noted  . Labral tear of left hip joint 08/13/2016  . Tear of left acetabular labrum 08/12/2016  . Unable to bear weight 08/12/2016  . Constipation   . Left hip pain   . Dependent edema 12/18/2015  . UTI (urinary tract infection) 12/13/2015  . Chronic respiratory failure ([place]) 12/02/2015  . Asthma, moderate persistent 08/27/2015  . Diverticulitis of colon 12/04/2014  . Abscess   . Colovesical fistula   . Early satiety   . Gastric polyps   . Diverticulitis of large intestine with abscess without bleeding   . Malnutrition of moderate degree ([place]) 11/02/2014  . Abnormal LFTs  (liver function tests)   . Dysphagia   . Abscess, abdomen ([place]) 10/16/2014  . Hyponatremia 10/16/2014  . Diverticulitis of large intestine with perforation and abscess without bleeding 10/16/2014  . Thrombocytopenia ([place]) 10/16/2014  . Dyspnea 10/05/2014  . Midline low back pain without sciatica   . Back pain 06/09/2014  . Fall   . CLL (chronic lymphocytic leukemia) ([place])   . Gallstones without obstruction of gallbladder   . Lumbar compression fracture ([place]) 06/08/2014  . Chronic lymphocytic leukemia ([place]) 06/06/2012  . Anemia, iron deficiency 06/06/2012    Past Surgical History:  Procedure Laterality Date  . ESOPHAGOGASTRODUODENOSCOPY N/A 11/07/2014   Procedure: ESOPHAGOGASTRODUODENOSCOPY (EGD);  Surgeon: Gatha Mayer, MD;  Location: [facility];  Service: Endoscopy;  Laterality: N/A;  . none      OB History    No data available       Home Medications    Prior to Admission medications   Medication Sig Start Date End Date Taking? Authorizing Provider  albuterol (PROVENTIL) (2.5 MG/3ML) 0.083% nebulizer solution Take 3 mLs (2.5 mg total) by nebulization 2 (two) times daily. 12/02/15  Yes Tammy S Parrett, NP  albuterol (VENTOLIN HFA) 108 (90 Base) MCG/ACT inhaler INHALE 1-2 PUFFS INTO THE LUNGS EVERY 6 HOURS AS NEEDED FOR WHEEZING OR SHORTNESS OF BREATH. Patient taking differently: Inhale 1-2 puffs into the lungs every 6 (six) hours as needed for wheezing or shortness of  breath.  12/02/15  Yes Tammy S Parrett, NP  Ascorbic Acid (VITAMIN C) 1000 MG tablet Take 1,000 mg by mouth daily.   Yes Historical Provider, MD  calcitonin, salmon, (MIACALCIN/FORTICAL) 200 UNIT/ACT nasal spray Place 1 spray into alternate nostrils daily. 11/18/15  Yes Edward Saguier, PA-C  gabapentin (NEURONTIN) 100 MG capsule TAKE 2 CAPSULES BY MOUTH 3 TIMES A DAY AS NEEDED FOR NERVE PAIN 07/13/16  Yes Edward Saguier, PA-C  GARLIC PO Take 1 tablet by mouth daily.   Yes Historical Provider, MD    HYDROcodone-acetaminophen (NORCO/VICODIN) 5-325 MG tablet Take 1/2 -1 tablets every 6 hours as needed for severe pain Patient taking differently: Take 0.5-1 tablets by mouth every 6 (six) hours as needed for moderate pain or severe pain.  08/14/16  Yes Clanford Marisa Hua, MD  hydrocortisone (ANUSOL-HC) 25 MG suppository Place 1 suppository (25 mg total) rectally 2 (two) times daily. Patient taking differently: Place 25 mg rectally 2 (two) times daily as needed for hemorrhoids.  12/15/15  Yes Lavina Hamman, MD  hyoscyamine (LEVSIN SL) 0.125 MG SL tablet PLACE 1 TABLET (0.125 MG TOTAL) UNDER THE TONGUE EVERY 8 (EIGHT) HOURS AS NEEDED. Patient taking differently: Take 0.125 mg by mouth every 8 (eight) hours as needed for cramping. . 11/18/15  Yes Edward Saguier, PA-C  mometasone-formoterol (DULERA) 200-5 MCG/ACT AERO Inhale 2 puffs into the lungs 2 (two) times daily. 07/13/16  Yes Rigoberto Noel, MD  Multiple Vitamin (MULTIVITAMIN WITH MINERALS) TABS tablet Take 1 tablet by mouth daily.   Yes Historical Provider, MD  omeprazole (PRILOSEC) 20 MG capsule TAKE ONE CAPSULE BY MOUTH EVERY DAY 07/20/16  Yes Edward Saguier, PA-C  predniSONE (DELTASONE) 10 MG tablet TAKE 1 TABLET BY MOUTH EVERY DAY 08/03/16  Yes [name], MD  senna (SENOKOT) 8.6 MG TABS tablet Take 1-2 tablets by mouth daily as needed for mild constipation.   Yes Historical Provider, MD  Simethicone (GAS-X PO) Take 2 capsules by mouth daily as needed (for gas).    Yes Historical Provider, MD  sucralfate (CARAFATE) 1 g tablet TAKE 2 TABLETS IN THE MORNING AND AT BEDTIME 06/23/16  Yes Ladene Artist, MD  vitamin B-12 (CYANOCOBALAMIN) 500 MCG tablet Take 500 mcg by mouth daily.   Yes Historical Provider, MD  naloxegol oxalate (MOVANTIK) 12.5 MG TABS tablet Take 1 tablet (12.5 mg total) by mouth daily. 08/27/16   Jola Schmidt, MD  polyethylene glycol powder [facility]) powder Take 17 g by mouth daily. 08/27/16   Jola Schmidt, MD  psyllium (METAMUCIL)  58.6 % packet Take 1 packet by mouth daily. Patient not taking: Reported on 08/27/2016 12/15/15   Lavina Hamman, MD    Family History Family History  Problem Relation Age of Onset  . Diabetes Father   . Stomach cancer Sister   . Gallbladder disease Mother   . Colon cancer Neg Hx   . Colon polyps Neg Hx   . Esophageal cancer Neg Hx     Social History Social History  Substance Use Topics  . Smoking status: Never Smoker  . Smokeless tobacco: Never Used  . Alcohol use No     Allergies   Citrus; Other; Augmentin [amoxicillin-pot clavulanate]; Ibuprofen; and Keflex [cephalexin]   Review of Systems Review of Systems  All other systems reviewed and are negative.    Physical Exam Updated Vital Signs BP 136/72 (BP Location: Left Arm)   Pulse 86   Temp 98.2 F (36.8 C)   Resp 16  SpO2 98%   Physical Exam  Constitutional: She is oriented to person, place, and time. She appears well-developed and well-nourished. No distress.  HENT:  Head: Normocephalic and atraumatic.  Eyes: EOM are normal.  Neck: Normal range of motion.  Cardiovascular: Normal rate, regular rhythm and normal heart sounds.   Pulmonary/Chest: Effort normal and breath sounds normal.  Abdominal: Soft. She exhibits no distension. There is no tenderness.  Musculoskeletal: Normal range of motion.  Neurological: She is alert and oriented to person, place, and time.  Skin: Skin is warm and dry.  Psychiatric: She has a normal mood and affect. Judgment normal.  Nursing note and vitals reviewed.    ED Treatments / Results  Labs (all labs ordered are listed, but only abnormal results are displayed) Labs Reviewed  CBC - Abnormal; Notable for the following:       Result Value   WBC 60.4 (*)    Hemoglobin 9.7 (*)    HCT 30.8 (*)    MCV 69.8 (*)    MCH 22.0 (*)    Platelets 118 (*)    All other components within normal limits  BASIC METABOLIC PANEL - Abnormal; Notable for the following:    Sodium 131 (*)      Chloride 98 (*)    Glucose, Bld 107 (*)    Calcium 8.0 (*)    All other components within normal limits    EKG  EKG Interpretation None       Radiology Dg Abd 2 Views  Result Date: 08/27/2016 CLINICAL DATA:  Abdominal pain. EXAM: ABDOMEN - 2 VIEW COMPARISON:  CT 08/21/2016 . FINDINGS: Soft tissue structures are unremarkable. Contrast noted throughout the colon. No bowel distention. No free air. Calcific density again noted consistent with a fibroid. Pelvic calcifications most consistent phleboliths. Degenerative changes lumbar spine with scoliosis concave left. IMPRESSION: No acute abnormality . Contrast from prior CT noted throughout the colon. No evidence of bowel obstruction. Electronically Signed   By: Marcello Moores  Register   On: 08/27/2016 11:14    Procedures Procedures (including critical care time)  Medications Ordered in ED Medications  sodium chloride 0.9 % bolus 1,000 mL (0 mLs Intravenous Stopped 08/27/16 1351)  naloxegol oxalate (MOVANTIK) tablet 12.5 mg (12.5 mg Oral Given 08/27/16 1038)  ondansetron (ZOFRAN) injection 4 mg (4 mg Intravenous Given 08/27/16 1026)     Initial Impression / Assessment and Plan / ED Course  I have reviewed the triage vital signs and the nursing notes.  Pertinent labs & imaging results that were available during my care of the patient were reviewed by me and considered in my medical decision making (see chart for details).    Patient was able to move some stool here after enema.  She'll be discharged home on a stool regimen.  Primary care follow-up.  She understands return to ER for new or worsening symptoms.  No signs of bowel obstruction.   Final Clinical Impressions(s) / ED Diagnoses   Final diagnoses:  Abdominal swelling  Constipation, unspecified constipation type    New Prescriptions Discharge Medication List as of 08/27/2016  1:36 PM    START taking these medications   Details  polyethylene glycol powder (MIRALAX) powder Take 17  g by mouth daily., Starting Thu 08/27/2016, Print         Jola Schmidt, MD 08/27/16 [phone]

## 2016-08-27 NOTE — ED Triage Notes (Signed)
Per EMS, pt from home.  Pt  C/o abdominal pain, n/v and constipation.  Pt seen a week ago and told she had constipation.  Told to return if no improvement.  Pt is bloated with brown emesis.  Still no bm.  Vitals:  HR 80, 114/80, cbg 132, 96% ra,

## 2016-08-27 NOTE — Telephone Encounter (Signed)
Pt again went to ED. She missed follow up with Dr. Nani Ravens the other day. Would you call pt and schedule appointment with him for pt. Try to talk with pt son and explain important to follow up. Make sure he understands. I would like to talk with you briefly before you call.

## 2016-08-31 ENCOUNTER — Telehealth (INDEPENDENT_AMBULATORY_CARE_PROVIDER_SITE_OTHER): Payer: Self-pay | Admitting: Orthopaedic Surgery

## 2016-08-31 DIAGNOSIS — C9110 Chronic lymphocytic leukemia of B-cell type not having achieved remission: Secondary | ICD-10-CM | POA: Diagnosis not present

## 2016-08-31 DIAGNOSIS — M24152 Other articular cartilage disorders, left hip: Secondary | ICD-10-CM | POA: Diagnosis not present

## 2016-08-31 DIAGNOSIS — K5904 Chronic idiopathic constipation: Secondary | ICD-10-CM | POA: Diagnosis not present

## 2016-08-31 DIAGNOSIS — J4540 Moderate persistent asthma, uncomplicated: Secondary | ICD-10-CM | POA: Diagnosis not present

## 2016-08-31 DIAGNOSIS — N390 Urinary tract infection, site not specified: Secondary | ICD-10-CM | POA: Diagnosis not present

## 2016-08-31 DIAGNOSIS — D509 Iron deficiency anemia, unspecified: Secondary | ICD-10-CM | POA: Diagnosis not present

## 2016-08-31 NOTE — Telephone Encounter (Signed)
[name] from [facility] called needing an okay for a urinalysis. CB # [phone]

## 2016-08-31 NOTE — Telephone Encounter (Signed)
yes

## 2016-08-31 NOTE — Telephone Encounter (Signed)
Please advise 

## 2016-09-01 ENCOUNTER — Telehealth (INDEPENDENT_AMBULATORY_CARE_PROVIDER_SITE_OTHER): Payer: Self-pay | Admitting: Radiology

## 2016-09-01 DIAGNOSIS — C9110 Chronic lymphocytic leukemia of B-cell type not having achieved remission: Secondary | ICD-10-CM | POA: Diagnosis not present

## 2016-09-01 DIAGNOSIS — M24152 Other articular cartilage disorders, left hip: Secondary | ICD-10-CM | POA: Diagnosis not present

## 2016-09-01 NOTE — Telephone Encounter (Signed)
I called advised [name] that Erlinda Hong said yes--message below

## 2016-09-01 NOTE — Telephone Encounter (Signed)
She's not my patient.  She needs to contact her PCP or whomever care she is under

## 2016-09-01 NOTE — Telephone Encounter (Signed)
Looks like note from yesterday advised yes --I called and advised maggie.

## 2016-09-01 NOTE — Telephone Encounter (Signed)
Stefanie Henry is call to see if we can give order for Urine analysis. States patient is having flank pain and burning with urination..  Please call [name] back at [phone]

## 2016-09-01 NOTE — Telephone Encounter (Signed)
Per provider instructions, please call patient and try to speak with Stefanie Henry to inform that patient needs to schedule [And Keep Appointment] for her Transfer of Care/ED Follow-up with Dr. James Ivanoff was patient requested], as if she cancel/no shows again, Dr. Nani Ravens probably will decline her as a new patient/SLS 03/06

## 2016-09-02 DIAGNOSIS — C9190 Lymphoid leukemia, unspecified not having achieved remission: Secondary | ICD-10-CM | POA: Diagnosis not present

## 2016-09-02 DIAGNOSIS — M6281 Muscle weakness (generalized): Secondary | ICD-10-CM | POA: Diagnosis not present

## 2016-09-02 DIAGNOSIS — J9611 Chronic respiratory failure with hypoxia: Secondary | ICD-10-CM | POA: Diagnosis not present

## 2016-09-02 DIAGNOSIS — M24159 Other articular cartilage disorders, unspecified hip: Secondary | ICD-10-CM | POA: Diagnosis not present

## 2016-09-03 ENCOUNTER — Other Ambulatory Visit: Payer: Self-pay | Admitting: Hematology & Oncology

## 2016-09-03 DIAGNOSIS — J9611 Chronic respiratory failure with hypoxia: Secondary | ICD-10-CM | POA: Diagnosis not present

## 2016-09-03 DIAGNOSIS — M24159 Other articular cartilage disorders, unspecified hip: Secondary | ICD-10-CM | POA: Diagnosis not present

## 2016-09-03 DIAGNOSIS — C9190 Lymphoid leukemia, unspecified not having achieved remission: Secondary | ICD-10-CM | POA: Diagnosis not present

## 2016-09-03 DIAGNOSIS — J4540 Moderate persistent asthma, uncomplicated: Secondary | ICD-10-CM | POA: Diagnosis not present

## 2016-09-03 DIAGNOSIS — K5904 Chronic idiopathic constipation: Secondary | ICD-10-CM | POA: Diagnosis not present

## 2016-09-03 DIAGNOSIS — M24152 Other articular cartilage disorders, left hip: Secondary | ICD-10-CM | POA: Diagnosis not present

## 2016-09-03 DIAGNOSIS — N390 Urinary tract infection, site not specified: Secondary | ICD-10-CM | POA: Diagnosis not present

## 2016-09-03 DIAGNOSIS — R531 Weakness: Secondary | ICD-10-CM | POA: Diagnosis not present

## 2016-09-03 DIAGNOSIS — D509 Iron deficiency anemia, unspecified: Secondary | ICD-10-CM | POA: Diagnosis not present

## 2016-09-03 DIAGNOSIS — C9110 Chronic lymphocytic leukemia of B-cell type not having achieved remission: Secondary | ICD-10-CM | POA: Diagnosis not present

## 2016-09-06 ENCOUNTER — Other Ambulatory Visit: Payer: Self-pay | Admitting: Gastroenterology

## 2016-09-09 DIAGNOSIS — D509 Iron deficiency anemia, unspecified: Secondary | ICD-10-CM | POA: Diagnosis not present

## 2016-09-09 DIAGNOSIS — K5904 Chronic idiopathic constipation: Secondary | ICD-10-CM | POA: Diagnosis not present

## 2016-09-09 DIAGNOSIS — C9110 Chronic lymphocytic leukemia of B-cell type not having achieved remission: Secondary | ICD-10-CM | POA: Diagnosis not present

## 2016-09-09 DIAGNOSIS — M24152 Other articular cartilage disorders, left hip: Secondary | ICD-10-CM | POA: Diagnosis not present

## 2016-09-09 DIAGNOSIS — N390 Urinary tract infection, site not specified: Secondary | ICD-10-CM | POA: Diagnosis not present

## 2016-09-09 DIAGNOSIS — J4540 Moderate persistent asthma, uncomplicated: Secondary | ICD-10-CM | POA: Diagnosis not present

## 2016-09-15 DIAGNOSIS — J4540 Moderate persistent asthma, uncomplicated: Secondary | ICD-10-CM | POA: Diagnosis not present

## 2016-09-15 DIAGNOSIS — M24152 Other articular cartilage disorders, left hip: Secondary | ICD-10-CM | POA: Diagnosis not present

## 2016-09-15 DIAGNOSIS — D509 Iron deficiency anemia, unspecified: Secondary | ICD-10-CM | POA: Diagnosis not present

## 2016-09-15 DIAGNOSIS — N390 Urinary tract infection, site not specified: Secondary | ICD-10-CM | POA: Diagnosis not present

## 2016-09-15 DIAGNOSIS — C9110 Chronic lymphocytic leukemia of B-cell type not having achieved remission: Secondary | ICD-10-CM | POA: Diagnosis not present

## 2016-09-15 DIAGNOSIS — K5904 Chronic idiopathic constipation: Secondary | ICD-10-CM | POA: Diagnosis not present

## 2016-09-16 DIAGNOSIS — N390 Urinary tract infection, site not specified: Secondary | ICD-10-CM | POA: Diagnosis not present

## 2016-09-16 DIAGNOSIS — K5904 Chronic idiopathic constipation: Secondary | ICD-10-CM | POA: Diagnosis not present

## 2016-09-16 DIAGNOSIS — C9110 Chronic lymphocytic leukemia of B-cell type not having achieved remission: Secondary | ICD-10-CM | POA: Diagnosis not present

## 2016-09-16 DIAGNOSIS — M24152 Other articular cartilage disorders, left hip: Secondary | ICD-10-CM | POA: Diagnosis not present

## 2016-09-16 DIAGNOSIS — J4540 Moderate persistent asthma, uncomplicated: Secondary | ICD-10-CM | POA: Diagnosis not present

## 2016-09-16 DIAGNOSIS — D509 Iron deficiency anemia, unspecified: Secondary | ICD-10-CM | POA: Diagnosis not present

## 2016-09-18 DIAGNOSIS — K5904 Chronic idiopathic constipation: Secondary | ICD-10-CM | POA: Diagnosis not present

## 2016-09-18 DIAGNOSIS — J4540 Moderate persistent asthma, uncomplicated: Secondary | ICD-10-CM | POA: Diagnosis not present

## 2016-09-18 DIAGNOSIS — M24152 Other articular cartilage disorders, left hip: Secondary | ICD-10-CM | POA: Diagnosis not present

## 2016-09-18 DIAGNOSIS — D509 Iron deficiency anemia, unspecified: Secondary | ICD-10-CM | POA: Diagnosis not present

## 2016-09-18 DIAGNOSIS — C9110 Chronic lymphocytic leukemia of B-cell type not having achieved remission: Secondary | ICD-10-CM | POA: Diagnosis not present

## 2016-09-18 DIAGNOSIS — N390 Urinary tract infection, site not specified: Secondary | ICD-10-CM | POA: Diagnosis not present

## 2016-09-22 DIAGNOSIS — M24152 Other articular cartilage disorders, left hip: Secondary | ICD-10-CM | POA: Diagnosis not present

## 2016-09-22 DIAGNOSIS — J4540 Moderate persistent asthma, uncomplicated: Secondary | ICD-10-CM | POA: Diagnosis not present

## 2016-09-22 DIAGNOSIS — K5904 Chronic idiopathic constipation: Secondary | ICD-10-CM | POA: Diagnosis not present

## 2016-09-22 DIAGNOSIS — N390 Urinary tract infection, site not specified: Secondary | ICD-10-CM | POA: Diagnosis not present

## 2016-09-22 DIAGNOSIS — C9110 Chronic lymphocytic leukemia of B-cell type not having achieved remission: Secondary | ICD-10-CM | POA: Diagnosis not present

## 2016-09-22 DIAGNOSIS — D509 Iron deficiency anemia, unspecified: Secondary | ICD-10-CM | POA: Diagnosis not present

## 2016-09-24 DIAGNOSIS — N390 Urinary tract infection, site not specified: Secondary | ICD-10-CM | POA: Diagnosis not present

## 2016-09-24 DIAGNOSIS — J4540 Moderate persistent asthma, uncomplicated: Secondary | ICD-10-CM | POA: Diagnosis not present

## 2016-09-24 DIAGNOSIS — D509 Iron deficiency anemia, unspecified: Secondary | ICD-10-CM | POA: Diagnosis not present

## 2016-09-24 DIAGNOSIS — M24152 Other articular cartilage disorders, left hip: Secondary | ICD-10-CM | POA: Diagnosis not present

## 2016-09-24 DIAGNOSIS — C9110 Chronic lymphocytic leukemia of B-cell type not having achieved remission: Secondary | ICD-10-CM | POA: Diagnosis not present

## 2016-09-24 DIAGNOSIS — K5904 Chronic idiopathic constipation: Secondary | ICD-10-CM | POA: Diagnosis not present

## 2016-09-29 DIAGNOSIS — M24152 Other articular cartilage disorders, left hip: Secondary | ICD-10-CM | POA: Diagnosis not present

## 2016-09-29 DIAGNOSIS — C9110 Chronic lymphocytic leukemia of B-cell type not having achieved remission: Secondary | ICD-10-CM | POA: Diagnosis not present

## 2016-09-29 DIAGNOSIS — J4540 Moderate persistent asthma, uncomplicated: Secondary | ICD-10-CM | POA: Diagnosis not present

## 2016-09-29 DIAGNOSIS — N390 Urinary tract infection, site not specified: Secondary | ICD-10-CM | POA: Diagnosis not present

## 2016-09-29 DIAGNOSIS — D509 Iron deficiency anemia, unspecified: Secondary | ICD-10-CM | POA: Diagnosis not present

## 2016-09-29 DIAGNOSIS — K5904 Chronic idiopathic constipation: Secondary | ICD-10-CM | POA: Diagnosis not present

## 2016-10-07 DIAGNOSIS — C9110 Chronic lymphocytic leukemia of B-cell type not having achieved remission: Secondary | ICD-10-CM | POA: Diagnosis not present

## 2016-10-07 DIAGNOSIS — J4540 Moderate persistent asthma, uncomplicated: Secondary | ICD-10-CM | POA: Diagnosis not present

## 2016-10-07 DIAGNOSIS — N390 Urinary tract infection, site not specified: Secondary | ICD-10-CM | POA: Diagnosis not present

## 2016-10-07 DIAGNOSIS — D509 Iron deficiency anemia, unspecified: Secondary | ICD-10-CM | POA: Diagnosis not present

## 2016-10-07 DIAGNOSIS — M24152 Other articular cartilage disorders, left hip: Secondary | ICD-10-CM | POA: Diagnosis not present

## 2016-10-07 DIAGNOSIS — K5904 Chronic idiopathic constipation: Secondary | ICD-10-CM | POA: Diagnosis not present

## 2016-10-08 ENCOUNTER — Ambulatory Visit (INDEPENDENT_AMBULATORY_CARE_PROVIDER_SITE_OTHER): Payer: Self-pay | Admitting: Orthopaedic Surgery

## 2016-10-09 DIAGNOSIS — D509 Iron deficiency anemia, unspecified: Secondary | ICD-10-CM | POA: Diagnosis not present

## 2016-10-09 DIAGNOSIS — N390 Urinary tract infection, site not specified: Secondary | ICD-10-CM | POA: Diagnosis not present

## 2016-10-09 DIAGNOSIS — K5904 Chronic idiopathic constipation: Secondary | ICD-10-CM | POA: Diagnosis not present

## 2016-10-09 DIAGNOSIS — M24152 Other articular cartilage disorders, left hip: Secondary | ICD-10-CM | POA: Diagnosis not present

## 2016-10-09 DIAGNOSIS — C9110 Chronic lymphocytic leukemia of B-cell type not having achieved remission: Secondary | ICD-10-CM | POA: Diagnosis not present

## 2016-10-09 DIAGNOSIS — J4540 Moderate persistent asthma, uncomplicated: Secondary | ICD-10-CM | POA: Diagnosis not present

## 2016-10-14 ENCOUNTER — Other Ambulatory Visit: Payer: Self-pay | Admitting: Hematology & Oncology

## 2016-10-15 ENCOUNTER — Ambulatory Visit (INDEPENDENT_AMBULATORY_CARE_PROVIDER_SITE_OTHER): Payer: Medicare Other | Admitting: Orthopaedic Surgery

## 2016-10-15 ENCOUNTER — Encounter (INDEPENDENT_AMBULATORY_CARE_PROVIDER_SITE_OTHER): Payer: Self-pay | Admitting: Orthopaedic Surgery

## 2016-10-15 DIAGNOSIS — M25552 Pain in left hip: Secondary | ICD-10-CM | POA: Diagnosis not present

## 2016-10-15 DIAGNOSIS — S73192A Other sprain of left hip, initial encounter: Secondary | ICD-10-CM | POA: Diagnosis not present

## 2016-10-15 NOTE — Progress Notes (Signed)
Office Visit Note   Patient: Stefanie Henry           Date of Birth: [birth year]           MRN: [id number] Visit Date: 10/15/2016              Requested by: Mackie Pai, PA-C [place], [place] [address] PCP: Mackie Pai, PA-C   Assessment & Plan: Visit Diagnoses:  1. Left hip pain   2. Tear of left acetabular labrum, initial encounter     Plan: I reviewed the MRI of the pelvis which showed a small anterior labral tear. No significant degenerative changes. I'll like to get a MRI to rule out structural abnormality.  This sounds like she is having radiculopathy. I will see him back MRI.  Follow-Up Instructions: Return in about 2 weeks (around 10/29/2016).   Orders:  Orders Placed This Encounter  Procedures  . MR Lumbar Spine w/o contrast   No orders of the defined types were placed in this encounter.     Procedures: No procedures performed   Clinical Data: No additional findings.   Subjective: Chief Complaint  Patient presents with  . Left Hip - Pain    Patient is a 83 year old female who has had left hip pain for a couple months that has kept her from walking. She went to the ER and a MRI of the pelvis showed a labral tear of the left hip. She did have an injection did help significantly. She continues to have pain in her lower back that radiates into the groin region. She has been taking Norco and naproxen with partial relief. The pain comes and goes. Sometimes she has has severe difficulty with ambulation. She is in a wheelchair today. She denies any mechanical symptoms. Denies any constitutional symptoms.    Review of Systems  Constitutional: Negative.   HENT: Negative.   Eyes: Negative.   Respiratory: Negative.   Cardiovascular: Negative.   Endocrine: Negative.   Musculoskeletal: Negative.   Neurological: Negative.   Hematological: Negative.   Psychiatric/Behavioral: Negative.   All other systems reviewed and are  negative.    Objective: Vital Signs: There were no vitals taken for this visit.  Physical Exam  Constitutional: She is oriented to person, place, and time. She appears well-developed and well-nourished.  HENT:  Head: Normocephalic and atraumatic.  Eyes: EOM are normal.  Neck: Neck supple.  Pulmonary/Chest: Effort normal.  Abdominal: Soft.  Neurological: She is alert and oriented to person, place, and time.  Skin: Skin is warm. Capillary refill takes less than 2 seconds.  Psychiatric: She has a normal mood and affect. Her behavior is normal. Judgment and thought content normal.  Nursing note and vitals reviewed.   Ortho Exam Left hip exam shows pretty good range of motion without significant pain. She has pain with deep flexion and internal rotation. No sciatic tension signs. Hip flexion is normal. No focal motor or sensory deficits. Specialty Comments:  No specialty comments available.  Imaging: No results found.   PMFS History: Patient Active Problem List   Diagnosis Date Noted  . Labral tear of left hip joint 08/13/2016  . Tear of left acetabular labrum 08/12/2016  . Unable to bear weight 08/12/2016  . Constipation   . Left hip pain   . Dependent edema 12/18/2015  . UTI (urinary tract infection) 12/13/2015  . Chronic respiratory failure ([place]) 12/02/2015  . Asthma, moderate persistent 08/27/2015  . Diverticulitis of colon  12/04/2014  . Abscess   . Colovesical fistula   . Early satiety   . Gastric polyps   . Diverticulitis of large intestine with abscess without bleeding   . Malnutrition of moderate degree ([place]) 11/02/2014  . Abnormal LFTs (liver function tests)   . Dysphagia   . Abscess, abdomen 10/16/2014  . Hyponatremia 10/16/2014  . Diverticulitis of large intestine with perforation and abscess without bleeding 10/16/2014  . Thrombocytopenia ([place]) 10/16/2014  . Dyspnea 10/05/2014  . Midline low back pain without sciatica   . Back pain 06/09/2014  . Fall    . CLL (chronic lymphocytic leukemia) ([place])   . Gallstones without obstruction of gallbladder   . Lumbar compression fracture ([place]) 06/08/2014  . Chronic lymphocytic leukemia ([place]) 06/06/2012  . Anemia, iron deficiency 06/06/2012   Past Medical History:  Diagnosis Date  . Anemia, iron deficiency 06/06/2012  . Arthritis   . Asthma   . Blood transfusion without reported diagnosis   . Cataract   . CHF (congestive heart failure) ([place]) 12/20/2015  . Chronic lymphatic leukemia ([place])   . COPD (chronic obstructive pulmonary disease) ([place])   . Diverticulitis   . Gallstones   . GERD (gastroesophageal reflux disease)   . Neuromuscular disorder ([place])   . Shingles     Family History  Problem Relation Age of Onset  . Diabetes Father   . Stomach cancer Sister   . Gallbladder disease Mother   . Colon cancer Neg Hx   . Colon polyps Neg Hx   . Esophageal cancer Neg Hx     Past Surgical History:  Procedure Laterality Date  . ESOPHAGOGASTRODUODENOSCOPY N/A 11/07/2014   Procedure: ESOPHAGOGASTRODUODENOSCOPY (EGD);  Surgeon: Gatha Mayer, MD;  Location: [facility];  Service: Endoscopy;  Laterality: N/A;  . none     Social History   Occupational History  . Retired    Social History Main Topics  . Smoking status: Never Smoker  . Smokeless tobacco: Never Used  . Alcohol use No  . Drug use: No  . Sexual activity: Not on file

## 2016-10-16 ENCOUNTER — Other Ambulatory Visit: Payer: Self-pay

## 2016-10-16 MED ORDER — GABAPENTIN 100 MG PO CAPS: ORAL_CAPSULE | ORAL | 0 refills | Status: DC

## 2016-10-16 NOTE — Telephone Encounter (Signed)
Left message on VM @ [phone] for patient to call and schedule appt.

## 2016-10-16 NOTE — Telephone Encounter (Signed)
Pt due for follow up please call and schedule appointment.  

## 2016-10-17 ENCOUNTER — Ambulatory Visit (HOSPITAL_BASED_OUTPATIENT_CLINIC_OR_DEPARTMENT_OTHER)
Admission: RE | Admit: 2016-10-17 | Discharge: 2016-10-17 | Disposition: A | Discharge status: Home / Self Care | Payer: Medicare Other | Source: Ambulatory Visit | Attending: Orthopaedic Surgery | Admitting: Orthopaedic Surgery

## 2016-10-17 DIAGNOSIS — M25552 Pain in left hip: Secondary | ICD-10-CM | POA: Diagnosis not present

## 2016-10-17 DIAGNOSIS — M5136 Other intervertebral disc degeneration, lumbar region: Secondary | ICD-10-CM | POA: Insufficient documentation

## 2016-10-17 DIAGNOSIS — M4854XA Collapsed vertebra, not elsewhere classified, thoracic region, initial encounter for fracture: Secondary | ICD-10-CM | POA: Insufficient documentation

## 2016-10-17 DIAGNOSIS — M4807 Spinal stenosis, lumbosacral region: Secondary | ICD-10-CM | POA: Insufficient documentation

## 2016-10-17 DIAGNOSIS — M545 Low back pain: Secondary | ICD-10-CM | POA: Diagnosis not present

## 2016-10-17 DIAGNOSIS — M48061 Spinal stenosis, lumbar region without neurogenic claudication: Secondary | ICD-10-CM | POA: Diagnosis not present

## 2016-10-19 DIAGNOSIS — J449 Chronic obstructive pulmonary disease, unspecified: Secondary | ICD-10-CM | POA: Diagnosis not present

## 2016-10-19 DIAGNOSIS — G8911 Acute pain due to trauma: Secondary | ICD-10-CM | POA: Diagnosis not present

## 2016-10-19 DIAGNOSIS — J9611 Chronic respiratory failure with hypoxia: Secondary | ICD-10-CM | POA: Diagnosis not present

## 2016-10-19 DIAGNOSIS — K5900 Constipation, unspecified: Secondary | ICD-10-CM | POA: Diagnosis not present

## 2016-10-29 ENCOUNTER — Telehealth: Payer: Self-pay | Admitting: Medical

## 2016-10-29 ENCOUNTER — Ambulatory Visit (HOSPITAL_BASED_OUTPATIENT_CLINIC_OR_DEPARTMENT_OTHER): Payer: Medicare Other | Admitting: Hematology & Oncology

## 2016-10-29 ENCOUNTER — Telehealth: Payer: Self-pay | Admitting: *Deleted

## 2016-10-29 ENCOUNTER — Other Ambulatory Visit (HOSPITAL_BASED_OUTPATIENT_CLINIC_OR_DEPARTMENT_OTHER): Payer: Medicare Other

## 2016-10-29 ENCOUNTER — Ambulatory Visit: Payer: Medicare Other

## 2016-10-29 VITALS — BP 122/65 | HR 89 | Temp 98.8°F | Resp 16 | Wt 127.8 lb

## 2016-10-29 DIAGNOSIS — C9110 Chronic lymphocytic leukemia of B-cell type not having achieved remission: Secondary | ICD-10-CM | POA: Diagnosis not present

## 2016-10-29 DIAGNOSIS — D509 Iron deficiency anemia, unspecified: Secondary | ICD-10-CM | POA: Diagnosis not present

## 2016-10-29 DIAGNOSIS — D508 Other iron deficiency anemias: Secondary | ICD-10-CM

## 2016-10-29 LAB — CMP (CANCER CENTER ONLY)
ALK PHOS: 94 U/L — AB (ref 26–84)
ALT: 16 U/L (ref 10–47)
AST: 18 U/L (ref 11–38)
Albumin: 3.6 g/dL (ref 3.3–5.5)
BUN: 4 mg/dL — AB (ref 7–22)
CO2: 27 mEq/L (ref 18–33)
CREATININE: 0.8 mg/dL (ref 0.6–1.2)
Calcium: 9 mg/dL (ref 8.0–10.3)
Chloride: 94 mEq/L — ABNORMAL LOW (ref 98–108)
GLUCOSE: 130 mg/dL — AB (ref 73–118)
POTASSIUM: 3.5 meq/L (ref 3.3–4.7)
SODIUM: 132 meq/L (ref 128–145)
Total Bilirubin: 0.6 mg/dl (ref 0.20–1.60)
Total Protein: 5.6 g/dL — ABNORMAL LOW (ref 6.4–8.1)

## 2016-10-29 LAB — CHCC SATELLITE - SMEAR

## 2016-10-29 LAB — CBC WITH DIFFERENTIAL (CANCER CENTER ONLY)
HEMATOCRIT: 32.6 % — AB (ref 34.8–46.6)
HEMOGLOBIN: 10.3 g/dL — AB (ref 11.6–15.9)
MCH: 22.7 pg — ABNORMAL LOW (ref 26.0–34.0)
MCHC: 31.6 g/dL — AB (ref 32.0–36.0)
MCV: 72 fL — AB (ref 81–101)
Platelets: 144 10*3/uL — ABNORMAL LOW (ref 145–400)
RBC: 4.54 10*6/uL (ref 3.70–5.32)
RDW: 15.7 % (ref 11.1–15.7)
WBC: 74.6 10*3/uL — AB (ref 3.9–10.0)

## 2016-10-29 LAB — MANUAL DIFFERENTIAL (CHCC SATELLITE)
ALC: 73.1 10*3/uL — AB (ref 0.6–2.2)
ANC (CHCC MAN DIFF): 1.5 10*3/uL (ref 1.5–6.7)
LYMPH: 98 % — AB (ref 14–48)
PLT EST ~~LOC~~: DECREASED
SEG: 2 % — ABNORMAL LOW (ref 40–75)

## 2016-10-29 NOTE — Progress Notes (Signed)
Hematology and Oncology Follow Up Visit  Stefanie Henry [id number] Nov 17, 1929 83 y.o. 10/29/2016   Principle Diagnosis:  Chronic lymphocytic leukemia - observation only, did not tolerate treatment and discontinued Iron deficiency anemia  Current Therapy:   IV iron as indicated - last received 2 doses in January 2016     Interim History:  Stefanie Henry is here today with her son for follow-up. We last saw her back in November. She was hospitalized couple months ago. She usually is hospitalized for constipation. No she has had some issues with diverticulitis.  It is amazing that she is still with this. She is an 83 years old. I must say that she does have a strong constitution.  She's not eating much according to her son. Her weight today is down about 3 pounds.  She's had no fever. She's had no bleeding. She's had no rashes.  She comes in a wheelchair. I would say that overall, her performance status is ECOG 3.    Medications:  Allergies as of 10/29/2016      Reactions   Citrus Other (See Comments)   Stomach discomfort   Other    Cannot take any antibiotics, causes rash   Augmentin [amoxicillin-pot Clavulanate] Swelling, Rash   Has patient had a PCN reaction causing immediate rash, facial/tongue/throat swelling, SOB or lightheadedness with hypotension: Unknown Has patient had a PCN reaction causing severe rash involving mucus membranes or skin necrosis: Unknown Has patient had a PCN reaction that required hospitalization: Unknown Has patient had a PCN reaction occurring within the last 10 years: Unknown If all of the above answers are "NO", then may proceed with Cephalosporin use.   Ibuprofen Other (See Comments)   Stomach discomfort   Keflex [cephalexin] Swelling, Rash      Medication List       Accurate as of 10/29/16 12:04 PM. Always use your most recent med list.          albuterol (2.5 MG/3ML) 0.083% nebulizer solution Commonly known as:  PROVENTIL Take 3 mLs (2.5 mg  total) by nebulization 2 (two) times daily.   albuterol 108 (90 Base) MCG/ACT inhaler Commonly known as:  VENTOLIN HFA INHALE 1-2 PUFFS INTO THE LUNGS EVERY 6 HOURS AS NEEDED FOR WHEEZING OR SHORTNESS OF BREATH.   calcitonin (salmon) 200 UNIT/ACT nasal spray Commonly known as:  MIACALCIN/FORTICAL Place 1 spray into alternate nostrils daily.   gabapentin 100 MG capsule Commonly known as:  NEURONTIN TAKE 2 CAPSULES BY MOUTH 3 TIMES A DAY AS NEEDED FOR NERVE PAIN   GARLIC PO Take 1 tablet by mouth daily.   GAS-X PO Take 2 capsules by mouth daily as needed (for gas).   HYDROcodone-acetaminophen 5-325 MG tablet Commonly known as:  NORCO/VICODIN Take 1/2 -1 tablets every 6 hours as needed for severe pain   hydrocortisone 25 MG suppository Commonly known as:  ANUSOL-HC Place 1 suppository (25 mg total) rectally 2 (two) times daily.   hyoscyamine 0.125 MG SL tablet Commonly known as:  LEVSIN SL PLACE 1 TABLET (0.125 MG TOTAL) UNDER THE TONGUE EVERY 8 (EIGHT) HOURS AS NEEDED.   mometasone-formoterol 200-5 MCG/ACT Aero Commonly known as:  DULERA Inhale 2 puffs into the lungs 2 (two) times daily.   multivitamin with minerals Tabs tablet Take 1 tablet by mouth daily.   naloxegol oxalate 12.5 MG Tabs tablet Commonly known as:  MOVANTIK Take 1 tablet (12.5 mg total) by mouth daily.   omeprazole 20 MG capsule Commonly known as:  PRILOSEC TAKE  ONE CAPSULE BY MOUTH EVERY DAY   polyethylene glycol powder powder Commonly known as:  MIRALAX Take 17 g by mouth daily.   predniSONE 10 MG tablet Commonly known as:  DELTASONE TAKE 1 TABLET BY MOUTH EVERY DAY   psyllium 58.6 % packet Commonly known as:  METAMUCIL Take 1 packet by mouth daily.   senna 8.6 MG Tabs tablet Commonly known as:  SENOKOT Take 1-2 tablets by mouth daily as needed for mild constipation.   sucralfate 1 g tablet Commonly known as:  CARAFATE TAKE 2 TABLETS IN THE MORNING AND AT BEDTIME   vitamin B-12 500  MCG tablet Commonly known as:  CYANOCOBALAMIN Take 500 mcg by mouth daily.   vitamin C 1000 MG tablet Take 1,000 mg by mouth daily.       Allergies:  Allergies  Allergen Reactions  . Citrus Other (See Comments)    Stomach discomfort  . Other     Cannot take any antibiotics, causes rash  . Augmentin [Amoxicillin-Pot Clavulanate] Swelling and Rash    Has patient had a PCN reaction causing immediate rash, facial/tongue/throat swelling, SOB or lightheadedness with hypotension: Unknown Has patient had a PCN reaction causing severe rash involving mucus membranes or skin necrosis: Unknown Has patient had a PCN reaction that required hospitalization: Unknown Has patient had a PCN reaction occurring within the last 10 years: Unknown If all of the above answers are "NO", then may proceed with Cephalosporin use.   . Ibuprofen Other (See Comments)    Stomach discomfort  . Keflex [Cephalexin] Swelling and Rash    Past Medical History, Surgical history, Social history, and Family History were reviewed and updated.  Review of Systems: All other 10 point review of systems is negative.   Physical Exam:  weight is 127 lb 12.8 oz (58 kg). Her oral temperature is 98.8 F (37.1 C). Her blood pressure is 122/65 and her pulse is 89. Her respiration is 16 and oxygen saturation is 98%.   Wt Readings from Last 3 Encounters:  10/29/16 127 lb 12.8 oz (58 kg)  08/21/16 142 lb (64.4 kg)  08/12/16 142 lb (64.4 kg)    Ocular: Sclerae unicteric, pupils equal, round and reactive to light Ear-nose-throat: Oropharynx clear, dentition fair Lymphatic: No cervical supraclavicular or axillary adenopathy Lungs no rales or rhonchi, good excursion bilaterally Heart regular rate and rhythm, no murmur appreciated Abd soft, nontender, positive bowel sounds, no liver or spleen tip palpated on exam, no fluid wave MSK no focal spinal tenderness, no joint edema Neuro: non-focal, well-oriented, appropriate  affect Breasts: Deferred  Lab Results  Component Value Date   WBC 74.6 (HH) 10/29/2016   HGB 10.3 (L) 10/29/2016   HCT 32.6 (L) 10/29/2016   MCV 72 (L) 10/29/2016   PLT 144 (L) 10/29/2016   Lab Results  Component Value Date   FERRITIN 267 05/08/2016   IRON 80 05/08/2016   TIBC 296 05/08/2016   UIBC 217 05/08/2016   IRONPCTSAT 27 05/08/2016   Lab Results  Component Value Date   RBC 4.54 10/29/2016   No results found for: KPAFRELGTCHN, LAMBDASER, KAPLAMBRATIO Lab Results  Component Value Date   IGGSERUM 136 (L) 12/13/2015   IGA 18 (L) 12/13/2015   IGMSERUM <5 (L) 12/13/2015   Lab Results  Component Value Date   TOTALPROTELP 5.7 (L) 06/06/2012   ALBUMINELP 66.8 (H) 06/06/2012   A1GS 5.0 (H) 06/06/2012   A2GS 14.4 (H) 06/06/2012   BETS 7.0 06/06/2012   BETA2SER 3.3 06/06/2012  GAMS 3.5 (L) 06/06/2012   MSPIKE NOT DET 06/06/2012   SPEI * 06/06/2012     Chemistry      Component Value Date/Time   NA 131 (L) 08/27/2016 1023   NA 135 (L) 03/29/2015 0908   K 4.1 08/27/2016 1023   K 3.5 03/29/2015 0908   CL 98 (L) 08/27/2016 1023   CL 93 (L) 01/16/2013 0851   CO2 27 08/27/2016 1023   CO2 25 03/29/2015 0908   BUN 6 08/27/2016 1023   BUN 7.9 03/29/2015 0908   CREATININE 0.54 08/27/2016 1023   CREATININE 0.68 06/04/2016 1120   CREATININE 0.7 03/29/2015 0908      Component Value Date/Time   CALCIUM 8.0 (L) 08/27/2016 1023   CALCIUM 8.6 03/29/2015 0908   ALKPHOS 107 08/21/2016 1139   ALKPHOS 63 03/29/2015 0908   AST 16 08/21/2016 1139   AST 10 03/29/2015 0908   ALT 16 08/21/2016 1139   ALT 11 03/29/2015 0908   BILITOT 0.6 08/21/2016 1139   BILITOT 0.44 03/29/2015 0908     Impression and Plan: Ms. Horrell is a very pleasant 83 yo [place] female with history of CLL and iron deficiency anemia. She is symptomatic with increased fatigue, weakness and SOB with any exertion.   We will have to check her iron studies. I suspect that her iron is on the low side given  that her MCV is dropping.  We will also set up a ultrasound of her abdomen to check for her spleen.  Her last ultrasound of the spleen was 2 years ago.  We really need to see her a little more often. I know her son is in charge of her appointments. He is never wanted her to be treated for the CLL area did maybe, with our new or treatments, he will agree.   We will see her back in 2 months.  [name], MD 5/3/201812:04 PM

## 2016-10-29 NOTE — Telephone Encounter (Signed)
Would you help me out. Pt  or family in past wanted to switch to Dr. Nani Ravens and I ok'd. But in epic she is still my patient. So not sure if Dr. Nani Ravens was ever asked if he would accept pt or if they reversed their decision. If they want to see MD I would ask they be given option to see other MD in office. If they want to stay with me then I am ok with that. I just not clear on their  motivation to ask to switch to Dr. Nani Ravens in the past. So if they want to see me would you call and document that are sticking with me.

## 2016-10-29 NOTE — Telephone Encounter (Signed)
Critical Value WBC 74.6 Dr [name] notified. No orders at this time

## 2016-10-30 ENCOUNTER — Telehealth: Payer: Self-pay | Admitting: *Deleted

## 2016-10-30 LAB — IRON AND TIBC
%SAT: 37 % (ref 21–57)
Iron: 109 ug/dL (ref 41–142)
TIBC: 291 ug/dL (ref 236–444)
UIBC: 182 ug/dL (ref 120–384)

## 2016-10-30 LAB — FERRITIN: FERRITIN: 325 ng/mL — AB (ref 9–269)

## 2016-10-30 NOTE — Telephone Encounter (Signed)
OK to change. This was approved earlier in the year I think, but she no-showed. If she no-shows again, I will not accept her and will let her current PCP decide if he wants to discharge her. TY.

## 2016-10-30 NOTE — Telephone Encounter (Signed)
Will you accept this patient as a transfer?

## 2016-10-30 NOTE — Telephone Encounter (Addendum)
Patient's son aware of results  ----- Message from [name], MD sent at 10/30/2016 10:54 AM EDT ----- Call her son -- the iron level is actually ok!!!  pete

## 2016-11-03 ENCOUNTER — Ambulatory Visit (HOSPITAL_BASED_OUTPATIENT_CLINIC_OR_DEPARTMENT_OTHER)
Admission: RE | Admit: 2016-11-03 | Discharge: 2016-11-03 | Disposition: A | Discharge status: Home / Self Care | Payer: Medicare Other | Source: Ambulatory Visit | Attending: Hematology & Oncology | Admitting: Hematology & Oncology

## 2016-11-03 DIAGNOSIS — C9110 Chronic lymphocytic leukemia of B-cell type not having achieved remission: Secondary | ICD-10-CM

## 2016-11-03 DIAGNOSIS — C9190 Lymphoid leukemia, unspecified not having achieved remission: Secondary | ICD-10-CM | POA: Diagnosis not present

## 2016-11-03 DIAGNOSIS — D508 Other iron deficiency anemias: Secondary | ICD-10-CM | POA: Diagnosis not present

## 2016-11-03 DIAGNOSIS — R935 Abnormal findings on diagnostic imaging of other abdominal regions, including retroperitoneum: Secondary | ICD-10-CM | POA: Diagnosis not present

## 2016-11-04 ENCOUNTER — Encounter: Payer: Self-pay | Admitting: *Deleted

## 2016-11-11 ENCOUNTER — Other Ambulatory Visit: Payer: Self-pay | Admitting: Hematology & Oncology

## 2016-11-30 ENCOUNTER — Encounter: Payer: Self-pay | Admitting: Pulmonary Disease

## 2016-11-30 ENCOUNTER — Ambulatory Visit (INDEPENDENT_AMBULATORY_CARE_PROVIDER_SITE_OTHER): Payer: Medicare Other | Admitting: Pulmonary Disease

## 2016-11-30 VITALS — BP 120/68 | HR 93 | Ht 59.0 in

## 2016-11-30 DIAGNOSIS — J4540 Moderate persistent asthma, uncomplicated: Secondary | ICD-10-CM

## 2016-11-30 DIAGNOSIS — R0602 Shortness of breath: Secondary | ICD-10-CM | POA: Diagnosis not present

## 2016-11-30 DIAGNOSIS — J9611 Chronic respiratory failure with hypoxia: Secondary | ICD-10-CM | POA: Diagnosis not present

## 2016-11-30 MED ORDER — FUROSEMIDE 20 MG PO TABS: ORAL_TABLET | ORAL | 0 refills | Status: DC

## 2016-11-30 NOTE — Patient Instructions (Signed)
CT angiogram of the chest to look for blood clots. Lasix 20 mg every other day#10 tabs

## 2016-11-30 NOTE — Assessment & Plan Note (Signed)
Not clear what is causing her increasing dyspnea, chest appears clear and there is no bronchospasm so she doesn't appear to be having an asthma exacerbation. Pedal edema does not appear to be worse Her mobility is obviously limited and she is at risk for VTE We'll check for pulmonary embolism and treat for fluid overload  CT angiogram of the chest to look for blood clots. Lasix 20 mg every other day#10 tabs

## 2016-11-30 NOTE — Progress Notes (Signed)
   Subjective:    Patient ID: Stefanie Henry, female    DOB: 01/07/1930, 83 y.o.   MRN: [id number]  HPI  45 yonever smoker Of [place] origin for FU of asthma/ dyspnea She reports asthma since childhood, but never required maintenance medications, worse as an adult and especially after diagnosis of CLL in 2000. Her last exacerbation was in 2004. She was evaluated by pulmonologist in [place] and placed on Advair and Ventolin  She was hospitalized in 2016 to [facility] for diverticulitis and placed on oxygen on discharge She has been maintained on pred 10 mg daily per [place] for CLL   Chief Complaint  Patient presents with  . Follow-up    Increased SOB for the past weeks. Denies chest pain or coughing.    Accompanied by her son Dyspnea has been worsening past few weeks. She had her routine oncology follow-up, hemoglobin is low but stable at 10.3. She continues on 10 mg of prednisone She has chronic pedal edema that is unchanged.  She's compliant with Dulera and takes albuterol MDI about twice daily but denies wheezing, sputum production She is visibly dyspneic today and is using her oxygen more  Significant tests/ events Echo 09/2014-normal LV function, grade 1 diastolic dysfunction CT abdomen 04/2015 bases of lungs appear clear   Spirometry 04/2015-ratio 66, FEV1 44% and FVC 49%    Past Medical History:  Diagnosis Date  . Anemia, iron deficiency 06/06/2012  . Arthritis   . Asthma   . Blood transfusion without reported diagnosis   . Cataract   . CHF (congestive heart failure) ([place]) 12/20/2015  . Chronic lymphatic leukemia ([place])   . COPD (chronic obstructive pulmonary disease) ([place])   . Diverticulitis   . Gallstones   . GERD (gastroesophageal reflux disease)   . Neuromuscular disorder ([place])   . Shingles      Review of Systems neg for any significant sore throat, dysphagia, itching, sneezing, nasal congestion or excess/ purulent secretions, fever, chills, sweats,  unintended wt loss, pleuritic or exertional cp, hempoptysis, orthopnea pnd or change in chronic leg swelling. Also denies presyncope, palpitations, heartburn, abdominal pain, nausea, vomiting, diarrhea or change in bowel or urinary habits, dysuria,hematuria, rash, arthralgias, visual complaints, headache, numbness weakness or ataxia.     Objective:   Physical Exam  Gen. Pleasant, elderly, well-nourished, in no distress ENT - no thrush, no post nasal drip Neck: No JVD, no thyromegaly, no carotid bruits Lungs: no use of accessory muscles, no dullness to percussion, decreased without rales or rhonchi  Cardiovascular: Rhythm regular, heart sounds  normal, no murmurs or gallops, 2+  peripheral edema Musculoskeletal: No deformities, no cyanosis or clubbing         Assessment & Plan:

## 2016-11-30 NOTE — Assessment & Plan Note (Signed)
Continue on Dulera Use albuterol twice daily and every 6 hours as needed for wheezing She remains on prednisone 10 mg for CLL

## 2016-12-01 ENCOUNTER — Ambulatory Visit (HOSPITAL_BASED_OUTPATIENT_CLINIC_OR_DEPARTMENT_OTHER): Admission: RE | Admit: 2016-12-01 | Payer: Medicare Other | Source: Ambulatory Visit

## 2016-12-02 ENCOUNTER — Ambulatory Visit (HOSPITAL_BASED_OUTPATIENT_CLINIC_OR_DEPARTMENT_OTHER): Payer: Medicare Other

## 2016-12-03 ENCOUNTER — Ambulatory Visit (HOSPITAL_BASED_OUTPATIENT_CLINIC_OR_DEPARTMENT_OTHER): Payer: Medicare Other

## 2016-12-07 ENCOUNTER — Ambulatory Visit (HOSPITAL_BASED_OUTPATIENT_CLINIC_OR_DEPARTMENT_OTHER): Payer: Medicare Other

## 2016-12-09 ENCOUNTER — Ambulatory Visit (HOSPITAL_BASED_OUTPATIENT_CLINIC_OR_DEPARTMENT_OTHER)
Admission: RE | Admit: 2016-12-09 | Discharge: 2016-12-09 | Disposition: A | Discharge status: Home / Self Care | Payer: Medicare Other | Source: Ambulatory Visit | Attending: Pulmonary Disease | Admitting: Pulmonary Disease

## 2016-12-09 ENCOUNTER — Encounter (HOSPITAL_BASED_OUTPATIENT_CLINIC_OR_DEPARTMENT_OTHER): Payer: Self-pay

## 2016-12-09 DIAGNOSIS — R0602 Shortness of breath: Secondary | ICD-10-CM

## 2016-12-09 DIAGNOSIS — M4854XA Collapsed vertebra, not elsewhere classified, thoracic region, initial encounter for fracture: Secondary | ICD-10-CM | POA: Diagnosis not present

## 2016-12-09 DIAGNOSIS — J9819 Other pulmonary collapse: Secondary | ICD-10-CM | POA: Insufficient documentation

## 2016-12-09 DIAGNOSIS — I517 Cardiomegaly: Secondary | ICD-10-CM | POA: Diagnosis not present

## 2016-12-09 DIAGNOSIS — I2510 Atherosclerotic heart disease of native coronary artery without angina pectoris: Secondary | ICD-10-CM | POA: Insufficient documentation

## 2016-12-09 DIAGNOSIS — I700 Atherosclerosis of aorta: Secondary | ICD-10-CM | POA: Insufficient documentation

## 2016-12-09 DIAGNOSIS — M4856XA Collapsed vertebra, not elsewhere classified, lumbar region, initial encounter for fracture: Secondary | ICD-10-CM | POA: Diagnosis not present

## 2016-12-09 MED ORDER — IOPAMIDOL (ISOVUE-370) INJECTION 76%
100.0000 mL | Freq: Once | INTRAVENOUS | Status: AC | PRN
  Administered 2016-12-09: 100 mL via INTRAVENOUS

## 2016-12-10 ENCOUNTER — Ambulatory Visit: Payer: Medicare Other | Admitting: Adult Health

## 2016-12-10 DIAGNOSIS — J449 Chronic obstructive pulmonary disease, unspecified: Secondary | ICD-10-CM | POA: Diagnosis not present

## 2016-12-10 DIAGNOSIS — M6281 Muscle weakness (generalized): Secondary | ICD-10-CM | POA: Diagnosis not present

## 2016-12-10 DIAGNOSIS — K5900 Constipation, unspecified: Secondary | ICD-10-CM | POA: Diagnosis not present

## 2016-12-10 DIAGNOSIS — G8911 Acute pain due to trauma: Secondary | ICD-10-CM | POA: Diagnosis not present

## 2016-12-11 ENCOUNTER — Other Ambulatory Visit: Payer: Self-pay | Admitting: Family

## 2016-12-17 ENCOUNTER — Other Ambulatory Visit: Payer: Self-pay | Admitting: Medical

## 2016-12-17 ENCOUNTER — Ambulatory Visit (INDEPENDENT_AMBULATORY_CARE_PROVIDER_SITE_OTHER): Payer: Medicare Other | Admitting: Adult Health

## 2016-12-17 ENCOUNTER — Encounter: Payer: Self-pay | Admitting: Adult Health

## 2016-12-17 DIAGNOSIS — C9110 Chronic lymphocytic leukemia of B-cell type not having achieved remission: Secondary | ICD-10-CM

## 2016-12-17 DIAGNOSIS — J4540 Moderate persistent asthma, uncomplicated: Secondary | ICD-10-CM

## 2016-12-17 DIAGNOSIS — C9190 Lymphoid leukemia, unspecified not having achieved remission: Secondary | ICD-10-CM

## 2016-12-17 DIAGNOSIS — J9611 Chronic respiratory failure with hypoxia: Secondary | ICD-10-CM | POA: Diagnosis not present

## 2016-12-17 NOTE — Patient Instructions (Addendum)
Follow with Primary MD regarding the compression fractures in back .  Continue on Dulera 2 puffs Twice daily  , rinse well after it.   Continue on Oxygen 2l/m with activity and At bedtime   follow up Dr. Elsworth Soho  In  3 months at [place] at [facility] .  Please contact office for sooner follow up if symptoms do not improve or worsen or seek emergency care

## 2016-12-17 NOTE — Assessment & Plan Note (Signed)
Cont on O2 with act and At bedtime   

## 2016-12-17 NOTE — Assessment & Plan Note (Signed)
Cont to follow with Oncology  WBC is very high, unclear if some of her sx are related to this as well.

## 2016-12-17 NOTE — Progress Notes (Signed)
@Patient  ID: Stefanie Henry, female    DOB: Oct 07, 1929, 83 y.o.   MRN: [id number]  Chief Complaint  Patient presents with  . Follow-up    Asthma     Referring provider: Elise Benne  HPI: 83 yo never smoker Of [place] origin for FU of asthma/ dyspnea She reports asthma since childhood, but never required maintenance medications, worse as an adult and especially after diagnosis of CLL in 2000. Her last exacerbation was in 2004. She was evaluated by pulmonologist in [place] and placed on Advair and Ventolin  She was hospitalized in 2016 to [facility] for diverticulitis and placed on oxygen on discharge She has been maintained on pred 10 mg daily per [facility] for CLL  Significant tests/ events Echo 09/2014-normal LV function, grade 1 diastolic dysfunction CT abdomen 04/2015 bases of lungs appear clear Chest x-ray 07/2015 suggests right middle lobe scarring Spirometry 04/2015-ratio 66, FEV1 44% and FVC 49%  12/17/2016 Follow up : Asthma , O2 RF  Pt returns for 2 week follow up . She is accompanied by her son that helps with translation.  She has severe chronic obstructive asthma. Last ov with persistent dyspnea . CT chest was done that was neg for PE . Chronic RML scarring /collapse , unchanged from 2015.  She remains on dulera Twice daily  . She denies cough or wheezing  Main complaint  Is dyspnea with activities.   She is on Oxygen 2l/m with act and At bedtime . Helps some.   She has CLL followed by Oncology . On Prednisone 10mg  daily .    Allergies  Allergen Reactions  . Citrus Other (See Comments)    Stomach discomfort  . Other     Cannot take any antibiotics, causes rash  . Augmentin [Amoxicillin-Pot Clavulanate] Swelling and Rash    Has patient had a PCN reaction causing immediate rash, facial/tongue/throat swelling, SOB or lightheadedness with hypotension: Unknown Has patient had a PCN reaction causing severe rash involving mucus membranes or skin necrosis:  Unknown Has patient had a PCN reaction that required hospitalization: Unknown Has patient had a PCN reaction occurring within the last 10 years: Unknown If all of the above answers are "NO", then may proceed with Cephalosporin use.   . Ibuprofen Other (See Comments)    Stomach discomfort  . Keflex [Cephalexin] Swelling and Rash    Immunization History  Administered Date(s) Administered  . Influenza,inj,Quad PF,36+ Mos 03/07/2015, 03/19/2016    Past Medical History:  Diagnosis Date  . Anemia, iron deficiency 06/06/2012  . Arthritis   . Asthma   . Blood transfusion without reported diagnosis   . Cataract   . CHF (congestive heart failure) ([place]) 12/20/2015  . Chronic lymphatic leukemia ([place])   . COPD (chronic obstructive pulmonary disease) ([place])   . Diverticulitis   . Gallstones   . GERD (gastroesophageal reflux disease)   . Neuromuscular disorder ([place])   . Shingles     Tobacco History: History  Smoking Status  . Never Smoker  Smokeless Tobacco  . Never Used   Counseling given: Not Answered   Outpatient Encounter Prescriptions as of 12/17/2016  Medication Sig  . albuterol (PROVENTIL) (2.5 MG/3ML) 0.083% nebulizer solution Take 3 mLs (2.5 mg total) by nebulization 2 (two) times daily.  Marland Kitchen albuterol (VENTOLIN HFA) 108 (90 Base) MCG/ACT inhaler INHALE 1-2 PUFFS INTO THE LUNGS EVERY 6 HOURS AS NEEDED FOR WHEEZING OR SHORTNESS OF BREATH. (Patient taking differently: Inhale 1-2 puffs into the lungs every 6 (six)  hours as needed for wheezing or shortness of breath. )  . Ascorbic Acid (VITAMIN C) 1000 MG tablet Take 1,000 mg by mouth daily.  . calcitonin, salmon, (MIACALCIN/FORTICAL) 200 UNIT/ACT nasal spray Place 1 spray into alternate nostrils daily.  . furosemide (LASIX) 20 MG tablet Take 1 tab every other day.  . gabapentin (NEURONTIN) 100 MG capsule TAKE 2 CAPSULES BY MOUTH 3 TIMES A DAY AS NEEDED FOR NERVE PAIN  . GARLIC PO Take 1 tablet by mouth daily.  Marland Kitchen  HYDROcodone-acetaminophen (NORCO/VICODIN) 5-325 MG tablet Take 1/2 -1 tablets every 6 hours as needed for severe pain (Patient taking differently: Take 0.5-1 tablets by mouth every 6 (six) hours as needed for moderate pain or severe pain. )  . hydrocortisone (ANUSOL-HC) 25 MG suppository Place 1 suppository (25 mg total) rectally 2 (two) times daily. (Patient taking differently: Place 25 mg rectally 2 (two) times daily as needed for hemorrhoids. )  . hyoscyamine (LEVSIN SL) 0.125 MG SL tablet PLACE 1 TABLET (0.125 MG TOTAL) UNDER THE TONGUE EVERY 8 (EIGHT) HOURS AS NEEDED. (Patient taking differently: Take 0.125 mg by mouth every 8 (eight) hours as needed for cramping. Marland Kitchen)  . mometasone-formoterol (DULERA) 200-5 MCG/ACT AERO Inhale 2 puffs into the lungs 2 (two) times daily.  . Multiple Vitamin (MULTIVITAMIN WITH MINERALS) TABS tablet Take 1 tablet by mouth daily.  . naloxegol oxalate (MOVANTIK) 12.5 MG TABS tablet Take 1 tablet (12.5 mg total) by mouth daily.  Marland Kitchen omeprazole (PRILOSEC) 20 MG capsule TAKE ONE CAPSULE BY MOUTH EVERY DAY  . polyethylene glycol powder (MIRALAX) powder Take 17 g by mouth daily.  . predniSONE (DELTASONE) 10 MG tablet TAKE 1 TABLET BY MOUTH EVERY DAY  . psyllium (METAMUCIL) 58.6 % packet Take 1 packet by mouth daily.  Marland Kitchen senna (SENOKOT) 8.6 MG TABS tablet Take 1-2 tablets by mouth daily as needed for mild constipation.  . Simethicone (GAS-X PO) Take 2 capsules by mouth daily as needed (for gas).   . sucralfate (CARAFATE) 1 g tablet TAKE 2 TABLETS IN THE MORNING AND AT BEDTIME  . vitamin B-12 (CYANOCOBALAMIN) 500 MCG tablet Take 500 mcg by mouth daily.   No facility-administered encounter medications on file as of 12/17/2016.      Review of Systems  Constitutional:   No  weight loss, night sweats,  Fevers, chills,  +fatigue, or  lassitude.  HEENT:   No headaches,  Difficulty swallowing,  Tooth/dental problems, or  Sore throat,                No sneezing, itching, ear  ache, nasal congestion, post nasal drip,   CV:  No chest pain,  Orthopnea, PND, swelling in lower extremities, anasarca, dizziness, palpitations, syncope.   GI  No heartburn, indigestion, abdominal pain, nausea, vomiting, diarrhea, change in bowel habits, loss of appetite, bloody stools.   Resp:  No chest wall deformity  Skin: no rash or lesions.  GU: no dysuria, change in color of urine, no urgency or frequency.  No flank pain, no hematuria   MS:  No joint pain or swelling.  No decreased range of motion.  No back pain.    Physical Exam  BP 96/63 (BP Location: Left Arm, Patient Position: Sitting, Cuff Size: Normal)   Pulse 86   Ht 4\' 11"  (1.499 m)   SpO2 96%   GEN: A/Ox3; pleasant , NAD, elderly in wc    HEENT:  [place]/AT,  EACs-clear, TMs-wnl, NOSE-clear, THROAT-clear, no lesions, no postnasal drip or  exudate noted.   NECK:  Supple w/ fair ROM; no JVD; normal carotid impulses w/o bruits; no thyromegaly or nodules palpated; no lymphadenopathy.    RESP  Clear  P & A; w/o, wheezes/ rales/ or rhonchi. no accessory muscle use, no dullness to percussion  CARD:  RRR, no m/r/g, no peripheral edema, pulses intact, no cyanosis or clubbing.  GI:   Soft & nt; nml bowel sounds; no organomegaly or masses detected.   Musco: Warm bil, no deformities or joint swelling noted.   Neuro: alert, no focal deficits noted.    Skin: Warm, no lesions or rashes  . Lab Results:  CBC    Component Value Date/Time   WBC 74.6 (HH) 10/29/2016 1125   WBC 60.4 (HH) 08/27/2016 1023   RBC 4.54 10/29/2016 1125   RBC 4.41 08/27/2016 1023   HGB 10.3 (L) 10/29/2016 1125   HCT 32.6 (L) 10/29/2016 1125   PLT 144 (L) 10/29/2016 1125   MCV 72 (L) 10/29/2016 1125   MCH 22.7 (L) 10/29/2016 1125   MCH 22.0 (L) 08/27/2016 1023   MCHC 31.6 (L) 10/29/2016 1125   MCHC 31.5 08/27/2016 1023   RDW 15.7 10/29/2016 1125   LYMPHSABS 78.7 (H) 08/21/2016 1139   LYMPHSABS 6.0 (H) 01/25/2013 1144   MONOABS 0.0 (L)  08/21/2016 1139   EOSABS 0.9 (H) 08/21/2016 1139   EOSABS 0.1 01/25/2013 1144   BASOSABS 0.0 08/21/2016 1139   BASOSABS 0.0 01/25/2013 1144    BMET    Component Value Date/Time   NA 132 10/29/2016 1218   NA 135 (L) 03/29/2015 0908   K 3.5 10/29/2016 1218   K 3.5 03/29/2015 0908   CL 94 (L) 10/29/2016 1218   CO2 27 10/29/2016 1218   CO2 25 03/29/2015 0908   GLUCOSE 130 (H) 10/29/2016 1218   BUN 4 (L) 10/29/2016 1218   BUN 7.9 03/29/2015 0908   CREATININE 0.8 10/29/2016 1218   CREATININE 0.7 03/29/2015 0908   CALCIUM 9.0 10/29/2016 1218   CALCIUM 8.6 03/29/2015 0908   GFRNONAA >60 08/27/2016 1023   GFRAA >60 08/27/2016 1023    BNP    Component Value Date/Time   BNP 70.5 06/04/2016 1120    ProBNP    Component Value Date/Time   PROBNP 107.0 (H) 04/28/2016 1532    Imaging: Ct Angio Chest Pe W Or Wo Contrast  Result Date: 12/09/2016 CLINICAL DATA:  Shortness of breath over the last 2 months. EXAM: CT ANGIOGRAPHY CHEST WITH CONTRAST TECHNIQUE: Multidetector CT imaging of the chest was performed using the standard protocol during bolus administration of intravenous contrast. Multiplanar CT image reconstructions and MIPs were obtained to evaluate the vascular anatomy. CONTRAST:  100 cc Isovue 370 COMPARISON:  Chest radiography 04/28/2016 FINDINGS: Cardiovascular: Pulmonary arterial opacification is excellent. No pulmonary emboli. The heart is mildly enlarged. There is coronary artery calcification. Mild atherosclerosis of the thoracic aorta. No pericardial fluid. Mediastinum/Nodes: No mass or lymphadenopathy. Lungs/Pleura: There is collapse of the right middle lobe. No evidence of proximal airway obstruction. Right upper lobe and right lower lobe are clear and normal in appearance. Left lung is clear and normal in appearance. Upper Abdomen: Normal Musculoskeletal: There are chronic degenerative changes of the thoracic spine. There is a T12 compression deformity with loss of height  of about 10-20%. The bone has a somewhat sclerotic appearance. This could relate to changes of healing, but an underlying malignancy is not excluded. There is an old superior endplate fracture at L1 visible on chest  radiography from October 2017. Review of the MIP images confirms the above findings. IMPRESSION: Chronic right middle lobe collapse. Etiology indeterminate. I do not see a central endobronchial lesion. Bronchoscopy may be indicated. Lungs are otherwise clear. Cardiomegaly. Coronary artery calcification. Aortic atherosclerosis. Old compression fracture at L1. Newly seen compression fracture at T12 with loss of height of 10-20%. This was not present on chest radiography of 04/28/2016. Somewhat irregular appearance of the bone at T12, which could be a response to healing. However, underlying tumor is not excluded. Consider MRI of the thoracic and upper lumbar spine to evaluate this further. Electronically Signed   By: Nelson Chimes M.D.   On: 12/09/2016 10:44     Assessment & Plan:   Asthma, moderate persistent Chronic obstructive asthma - no active flare sx  Unclear if her degree of dyspnea is due to her severe airflow obstruction  Workup has been unrevealing  Cont w/ current regimen .    Chronic lymphocytic leukemia ([place]) Cont to follow with Oncology  WBC is very high, unclear if some of her sx are related to this as well.  Chronic respiratory failure ([place]) Cont on O2 with act and At bedtime       Rexene Edison, NP 12/17/2016

## 2016-12-17 NOTE — Assessment & Plan Note (Signed)
Chronic obstructive asthma - no active flare sx  Unclear if her degree of dyspnea is due to her severe airflow obstruction  Workup has been unrevealing  Cont w/ current regimen .

## 2016-12-21 ENCOUNTER — Ambulatory Visit (INDEPENDENT_AMBULATORY_CARE_PROVIDER_SITE_OTHER): Payer: Medicare Other | Admitting: Orthopaedic Surgery

## 2016-12-21 ENCOUNTER — Encounter (INDEPENDENT_AMBULATORY_CARE_PROVIDER_SITE_OTHER): Payer: Self-pay | Admitting: Orthopaedic Surgery

## 2016-12-21 DIAGNOSIS — G8929 Other chronic pain: Secondary | ICD-10-CM | POA: Diagnosis not present

## 2016-12-21 DIAGNOSIS — M5442 Lumbago with sciatica, left side: Secondary | ICD-10-CM | POA: Diagnosis not present

## 2016-12-21 DIAGNOSIS — M5441 Lumbago with sciatica, right side: Secondary | ICD-10-CM

## 2016-12-21 NOTE — Progress Notes (Signed)
Reviewed & agree with plan  

## 2016-12-21 NOTE — Progress Notes (Signed)
Office Visit Note   Patient: Stefanie Henry           Date of Birth: [birth year]           MRN: [id number] Visit Date: 12/21/2016              Requested by: Mackie Pai, PA-C [place], [place] [address] PCP: Mackie Pai, PA-C   Assessment & Plan: Visit Diagnoses:  1. Chronic low back pain with bilateral sciatica, unspecified back pain laterality     Plan: MRI of her lumbar spine shows severe spinal stenosis. Patient is essentially become nonambulatory because of this pain. Her hip no longer hurts after her steroid injection. Her MRI of her hip only showed a labral tear which would not render the patient nonambulatory. Referral to Dr. Kathyrn Sheriff with [facility] neurosurgery.  Follow-Up Instructions: Return if symptoms worsen or fail to improve.   Orders:  Orders Placed This Encounter  Procedures  . Ambulatory referral to Neurosurgery   No orders of the defined types were placed in this encounter.     Procedures: No procedures performed   Clinical Data: No additional findings.   Subjective: Chief Complaint  Patient presents with  . Lower Back - Pain    Patient follows up today for review of her MRI of her lumbar spine. She denies any acute changes.    Review of Systems  Constitutional: Negative.   HENT: Negative.   Eyes: Negative.   Respiratory: Negative.   Cardiovascular: Negative.   Endocrine: Negative.   Musculoskeletal: Negative.   Neurological: Negative.   Hematological: Negative.   Psychiatric/Behavioral: Negative.   All other systems reviewed and are negative.    Objective: Vital Signs: There were no vitals taken for this visit.  Physical Exam  Constitutional: She is oriented to person, place, and time. She appears well-developed and well-nourished.  Pulmonary/Chest: [place] normal.  Neurological: She is alert and oriented to person, place, and time.  Skin: Skin is warm. Capillary refill takes less than 2 seconds.   Psychiatric: She has a normal mood and affect. Her behavior is normal. Judgment and thought content normal.  Nursing note and vitals reviewed.   Ortho Exam Exam is stable. Specialty Comments:  No specialty comments available.  Imaging: No results found.   PMFS History: Patient Active Problem List   Diagnosis Date Noted  . Labral tear of left hip joint 08/13/2016  . Tear of left acetabular labrum 08/12/2016  . Unable to bear weight 08/12/2016  . Constipation   . Left hip pain   . Dependent edema 12/18/2015  . UTI (urinary tract infection) 12/13/2015  . Chronic respiratory failure ([place]) 12/02/2015  . Asthma, moderate persistent 08/27/2015  . Diverticulitis of colon 12/04/2014  . Abscess   . Colovesical fistula   . Early satiety   . Gastric polyps   . Diverticulitis of large intestine with abscess without bleeding   . Malnutrition of moderate degree ([place]) 11/02/2014  . Abnormal LFTs (liver function tests)   . Dysphagia   . Abscess, abdomen 10/16/2014  . Hyponatremia 10/16/2014  . Diverticulitis of large intestine with perforation and abscess without bleeding 10/16/2014  . Thrombocytopenia ([place]) 10/16/2014  . Dyspnea 10/05/2014  . Midline low back pain without sciatica   . Back pain 06/09/2014  . Fall   . CLL (chronic lymphocytic leukemia) ([place])   . Gallstones without obstruction of gallbladder   . Lumbar compression fracture ([place]) 06/08/2014  . Chronic lymphocytic leukemia (  [place]) 06/06/2012  . Anemia, iron deficiency 06/06/2012   Past Medical History:  Diagnosis Date  . Anemia, iron deficiency 06/06/2012  . Arthritis   . Asthma   . Blood transfusion without reported diagnosis   . Cataract   . CHF (congestive heart failure) ([place]) 12/20/2015  . Chronic lymphatic leukemia ([place])   . COPD (chronic obstructive pulmonary disease) ([place])   . Diverticulitis   . Gallstones   . GERD (gastroesophageal reflux disease)   . Neuromuscular disorder ([place])   . Shingles       Family History  Problem Relation Age of Onset  . Diabetes Father   . Stomach cancer Sister   . Gallbladder disease Mother   . Colon cancer Neg Hx   . Colon polyps Neg Hx   . Esophageal cancer Neg Hx     Past Surgical History:  Procedure Laterality Date  . ESOPHAGOGASTRODUODENOSCOPY N/A 11/07/2014   Procedure: ESOPHAGOGASTRODUODENOSCOPY (EGD);  Surgeon: Gatha Mayer, MD;  Location: [facility];  Service: Endoscopy;  Laterality: N/A;  . none     Social History   Occupational History  . Retired    Social History Main Topics  . Smoking status: Never Smoker  . Smokeless tobacco: Never Used  . Alcohol use No  . Drug use: No  . Sexual activity: Not on file

## 2016-12-29 ENCOUNTER — Other Ambulatory Visit: Payer: Medicare Other

## 2016-12-29 ENCOUNTER — Ambulatory Visit: Payer: Medicare Other | Admitting: Hematology & Oncology

## 2016-12-31 ENCOUNTER — Other Ambulatory Visit: Payer: Self-pay

## 2016-12-31 MED ORDER — CALCITONIN (SALMON) 200 UNIT/ACT NA SOLN: 1.0000 | Freq: Every day | NASAL | 12 refills | Status: AC

## 2017-01-15 ENCOUNTER — Other Ambulatory Visit: Payer: Self-pay | Admitting: Pulmonary Disease

## 2017-01-15 DIAGNOSIS — J9611 Chronic respiratory failure with hypoxia: Secondary | ICD-10-CM | POA: Diagnosis not present

## 2017-01-15 DIAGNOSIS — J449 Chronic obstructive pulmonary disease, unspecified: Secondary | ICD-10-CM | POA: Diagnosis not present

## 2017-01-15 DIAGNOSIS — K5900 Constipation, unspecified: Secondary | ICD-10-CM | POA: Diagnosis not present

## 2017-01-15 DIAGNOSIS — C9190 Lymphoid leukemia, unspecified not having achieved remission: Secondary | ICD-10-CM | POA: Diagnosis not present

## 2017-01-17 ENCOUNTER — Other Ambulatory Visit: Payer: Self-pay | Admitting: Family

## 2017-01-18 DIAGNOSIS — S32020A Wedge compression fracture of second lumbar vertebra, initial encounter for closed fracture: Secondary | ICD-10-CM | POA: Diagnosis not present

## 2017-01-18 DIAGNOSIS — M545 Low back pain: Secondary | ICD-10-CM | POA: Diagnosis not present

## 2017-01-18 DIAGNOSIS — G8929 Other chronic pain: Secondary | ICD-10-CM | POA: Diagnosis not present

## 2017-01-19 ENCOUNTER — Other Ambulatory Visit: Payer: Self-pay | Admitting: Neurosurgery

## 2017-01-19 DIAGNOSIS — S32020G Wedge compression fracture of second lumbar vertebra, subsequent encounter for fracture with delayed healing: Secondary | ICD-10-CM

## 2017-01-21 ENCOUNTER — Other Ambulatory Visit (HOSPITAL_BASED_OUTPATIENT_CLINIC_OR_DEPARTMENT_OTHER): Payer: Self-pay | Admitting: Neurosurgery

## 2017-01-21 ENCOUNTER — Other Ambulatory Visit (HOSPITAL_BASED_OUTPATIENT_CLINIC_OR_DEPARTMENT_OTHER): Payer: Self-pay | Admitting: Internal Medicine

## 2017-01-21 DIAGNOSIS — S32020S Wedge compression fracture of second lumbar vertebra, sequela: Secondary | ICD-10-CM

## 2017-01-21 DIAGNOSIS — R109 Unspecified abdominal pain: Secondary | ICD-10-CM

## 2017-01-25 ENCOUNTER — Ambulatory Visit (HOSPITAL_BASED_OUTPATIENT_CLINIC_OR_DEPARTMENT_OTHER)
Admission: RE | Admit: 2017-01-25 | Discharge: 2017-01-25 | Disposition: A | Discharge status: Home / Self Care | Payer: Medicare Other | Source: Ambulatory Visit | Attending: Neurosurgery | Admitting: Neurosurgery

## 2017-01-25 DIAGNOSIS — S32020A Wedge compression fracture of second lumbar vertebra, initial encounter for closed fracture: Secondary | ICD-10-CM | POA: Diagnosis not present

## 2017-01-25 DIAGNOSIS — X58XXXA Exposure to other specified factors, initial encounter: Secondary | ICD-10-CM | POA: Diagnosis not present

## 2017-01-25 DIAGNOSIS — M810 Age-related osteoporosis without current pathological fracture: Secondary | ICD-10-CM | POA: Insufficient documentation

## 2017-01-25 DIAGNOSIS — Z780 Asymptomatic menopausal state: Secondary | ICD-10-CM | POA: Diagnosis not present

## 2017-01-25 DIAGNOSIS — S32020S Wedge compression fracture of second lumbar vertebra, sequela: Secondary | ICD-10-CM

## 2017-02-01 ENCOUNTER — Other Ambulatory Visit: Payer: Medicare Other

## 2017-02-01 ENCOUNTER — Ambulatory Visit (HOSPITAL_BASED_OUTPATIENT_CLINIC_OR_DEPARTMENT_OTHER)
Admission: RE | Admit: 2017-02-01 | Discharge: 2017-02-01 | Disposition: A | Discharge status: Home / Self Care | Payer: Medicare Other | Source: Ambulatory Visit | Attending: Internal Medicine | Admitting: Internal Medicine

## 2017-02-01 DIAGNOSIS — M4854XA Collapsed vertebra, not elsewhere classified, thoracic region, initial encounter for fracture: Secondary | ICD-10-CM | POA: Diagnosis not present

## 2017-02-01 DIAGNOSIS — M438X8 Other specified deforming dorsopathies, sacral and sacrococcygeal region: Secondary | ICD-10-CM | POA: Diagnosis not present

## 2017-02-01 DIAGNOSIS — N8189 Other female genital prolapse: Secondary | ICD-10-CM | POA: Insufficient documentation

## 2017-02-01 DIAGNOSIS — J9811 Atelectasis: Secondary | ICD-10-CM | POA: Diagnosis not present

## 2017-02-01 DIAGNOSIS — I700 Atherosclerosis of aorta: Secondary | ICD-10-CM | POA: Diagnosis not present

## 2017-02-01 DIAGNOSIS — D259 Leiomyoma of uterus, unspecified: Secondary | ICD-10-CM | POA: Diagnosis not present

## 2017-02-01 DIAGNOSIS — R109 Unspecified abdominal pain: Secondary | ICD-10-CM

## 2017-02-01 DIAGNOSIS — M4856XA Collapsed vertebra, not elsewhere classified, lumbar region, initial encounter for fracture: Secondary | ICD-10-CM | POA: Diagnosis not present

## 2017-02-01 DIAGNOSIS — I517 Cardiomegaly: Secondary | ICD-10-CM | POA: Diagnosis not present

## 2017-02-01 DIAGNOSIS — R935 Abnormal findings on diagnostic imaging of other abdominal regions, including retroperitoneum: Secondary | ICD-10-CM | POA: Insufficient documentation

## 2017-02-13 ENCOUNTER — Other Ambulatory Visit: Payer: Self-pay | Admitting: Family

## 2017-02-17 DIAGNOSIS — G894 Chronic pain syndrome: Secondary | ICD-10-CM | POA: Diagnosis not present

## 2017-02-17 DIAGNOSIS — K5900 Constipation, unspecified: Secondary | ICD-10-CM | POA: Diagnosis not present

## 2017-02-17 DIAGNOSIS — J449 Chronic obstructive pulmonary disease, unspecified: Secondary | ICD-10-CM | POA: Diagnosis not present

## 2017-02-17 DIAGNOSIS — Z0000 Encounter for general adult medical examination without abnormal findings: Secondary | ICD-10-CM | POA: Diagnosis not present

## 2017-02-17 DIAGNOSIS — R1030 Lower abdominal pain, unspecified: Secondary | ICD-10-CM | POA: Diagnosis not present

## 2017-02-17 DIAGNOSIS — J9611 Chronic respiratory failure with hypoxia: Secondary | ICD-10-CM | POA: Diagnosis not present

## 2017-03-05 ENCOUNTER — Other Ambulatory Visit (INDEPENDENT_AMBULATORY_CARE_PROVIDER_SITE_OTHER): Payer: Medicare Other

## 2017-03-05 ENCOUNTER — Telehealth: Payer: Self-pay | Admitting: Medical

## 2017-03-05 ENCOUNTER — Ambulatory Visit (INDEPENDENT_AMBULATORY_CARE_PROVIDER_SITE_OTHER): Payer: Medicare Other | Admitting: Adult Health

## 2017-03-05 ENCOUNTER — Encounter: Payer: Self-pay | Admitting: Adult Health

## 2017-03-05 VITALS — BP 128/78 | HR 92 | Ht 60.0 in | Wt 135.2 lb

## 2017-03-05 DIAGNOSIS — J9611 Chronic respiratory failure with hypoxia: Secondary | ICD-10-CM

## 2017-03-05 DIAGNOSIS — C9110 Chronic lymphocytic leukemia of B-cell type not having achieved remission: Secondary | ICD-10-CM

## 2017-03-05 DIAGNOSIS — J4540 Moderate persistent asthma, uncomplicated: Secondary | ICD-10-CM

## 2017-03-05 DIAGNOSIS — R0602 Shortness of breath: Secondary | ICD-10-CM

## 2017-03-05 DIAGNOSIS — C9190 Lymphoid leukemia, unspecified not having achieved remission: Secondary | ICD-10-CM

## 2017-03-05 LAB — CBC WITH DIFFERENTIAL/PLATELET
BASOS ABS: 0.2 10*3/uL — AB (ref 0.0–0.1)
BASOS PCT: 0.4 % (ref 0.0–3.0)
EOS ABS: 0.1 10*3/uL (ref 0.0–0.7)
Eosinophils Relative: 0.2 % (ref 0.0–5.0)
HCT: 33.9 % — ABNORMAL LOW (ref 36.0–46.0)
Hemoglobin: 10.4 g/dL — ABNORMAL LOW (ref 12.0–15.0)
Lymphs Abs: 55.2 10*3/uL — ABNORMAL HIGH (ref 0.7–4.0)
MCHC: 30.6 g/dL (ref 30.0–36.0)
MCV: 74.8 fl — ABNORMAL LOW (ref 78.0–100.0)
Monocytes Absolute: 1 10*3/uL (ref 0.1–1.0)
Monocytes Relative: 1.6 % — ABNORMAL LOW (ref 3.0–12.0)
NEUTROS ABS: 5.5 10*3/uL (ref 1.4–7.7)
NEUTROS PCT: 8.9 % — AB (ref 43.0–77.0)
PLATELETS: 107 10*3/uL — AB (ref 150.0–400.0)
RBC: 4.54 Mil/uL (ref 3.87–5.11)
RDW: 14.2 % (ref 11.5–15.5)
WBC: 62 10*3/uL (ref 4.0–10.5)

## 2017-03-05 LAB — BASIC METABOLIC PANEL
BUN: 10 mg/dL (ref 6–23)
CHLORIDE: 97 meq/L (ref 96–112)
CO2: 25 mEq/L (ref 19–32)
Calcium: 8.5 mg/dL (ref 8.4–10.5)
Creatinine, Ser: 0.71 mg/dL (ref 0.40–1.20)
GFR: 83.03 mL/min (ref >60.00)
Glucose, Bld: 174 mg/dL — ABNORMAL HIGH (ref 70–99)
POTASSIUM: 4 meq/L (ref 3.5–5.1)
Sodium: 130 mEq/L — ABNORMAL LOW (ref 135–145)

## 2017-03-05 LAB — BRAIN NATRIURETIC PEPTIDE: PRO B NATRI PEPTIDE: 92 pg/mL (ref 0.0–100.0)

## 2017-03-05 MED ORDER — FUROSEMIDE 20 MG PO TABS: 20.0000 mg | ORAL_TABLET | Freq: Every day | ORAL | 1 refills | Status: DC | PRN

## 2017-03-05 NOTE — Assessment & Plan Note (Signed)
Cont on O2 with act and At bedtime   

## 2017-03-05 NOTE — Assessment & Plan Note (Signed)
Needs follow up with Oncology as discussed

## 2017-03-05 NOTE — Assessment & Plan Note (Signed)
Chronic Dyspnea suspect is multifactoral with extensive workup in past. She does have chronic fixed asthma but does not appear to be decompensated. Previous CT chest w/ no PE . Chronic RML volume loss but stable since 2015 doubt this is major contributing factor . She has CLL with very high wbc , recent CT abd and ABD [place] did not show sign spleenomegaly .  May have component of Diastolic dysfunction with mild fluid overload with LE edema on exam  Trial of low dose Lasix Check labs   Plan  Patient Instructions  Begin Lasix 20mg  daily for  5 days , then daily As needed  For leg swelling .  Labs today .  Continue on Dulera 2 puffs Twice daily  , rinse well after it.   Continue on Oxygen 2l/m with activity and At bedtime   Follow up with Dr. [name] as discussed  follow up Dr. Elsworth Soho  In  1  months at [place] at [facility] .  Please contact office for sooner follow up if symptoms do not improve or worsen or seek emergency care

## 2017-03-05 NOTE — Patient Instructions (Signed)
Begin Lasix 20mg  daily for  5 days , then daily As needed  For leg swelling .  Labs today .  Continue on Dulera 2 puffs Twice daily  , rinse well after it.   Continue on Oxygen 2l/m with activity and At bedtime   Follow up with Dr. [name] as discussed  follow up Dr. Elsworth Soho  In  1  months at [place] at [facility] .  Please contact office for sooner follow up if symptoms do not improve or worsen or seek emergency care

## 2017-03-05 NOTE — Progress Notes (Signed)
@Patient  ID: Stefanie Henry, female    DOB: [birth year], 83 y.o.   MRN: [id number]  Chief Complaint  Patient presents with  . Acute Visit    difficulty breathing    Referring provider: Elise Benne  HPI: [phone] never smoker Of [place] origin for FU of asthma/ dyspnea She reports asthma since childhood, but never required maintenance medications, worse as an adult and especially after diagnosis of CLL in 2000. Her last exacerbation was in 2004. She was evaluated by pulmonologist in [place] and placed on Advair and Ventolin  She was hospitalized in 2016 to [facility] for diverticulitis and placed on oxygen on discharge She has been maintained on pred 10 mg daily per [facility] for CLL  Significant tests/ events Echo 09/2014-normal LV function, grade 1 diastolic dysfunction CT abdomen 04/2015 bases of lungs appear clear Chest x-ray 07/2015 suggests right middle lobe scarring Spirometry 04/2015-ratio 66, FEV1 44% and FVC 49% CT chest 12/09/2016 shows chronic right middle lobe collapse, with no obvious central endobronchial lesion. Lungs otherwise clear., Negative for PE  03/05/2017 Acute OV : Asthma ,O2 RF ,  Patient presents for an acute office visit. She is accompanied by her son helps with translation. Patient has a known history of severe chronic obstructive asthma. Patient complains of persistent shortness of breath . Gets winded with minimal activity .  She has no flare of cough or wheezing .  She is on Dulera twice daily Continues to have swelling in legs. Feels tight in her stomach . No n/v/d.  Previously started on lasix few months ago but is no longer taking .  Abdominal ultrasound in May 2018 was essentially unremarkable with no significant splenomegaly. CT abdomen and pelvis done earlier this month showed a possible fistula in colon/bladder. She has been referred to Urology. No obvious diverticulitis.   She is on oxygen at 2 L with activity and at bedtime.  She has  CLL followed by oncology. She is on prednisone 10 mg daily Chart review shows last white blood cell count 74,000. She declines treatment  She did not keep follow up with Dr. [name] as recommended.   Allergies  Allergen Reactions  . Citrus Other (See Comments)    Stomach discomfort  . Other     Cannot take any antibiotics, causes rash  . Augmentin [Amoxicillin-Pot Clavulanate] Swelling and Rash    Has patient had a PCN reaction causing immediate rash, facial/tongue/throat swelling, SOB or lightheadedness with hypotension: Unknown Has patient had a PCN reaction causing severe rash involving mucus membranes or skin necrosis: Unknown Has patient had a PCN reaction that required hospitalization: Unknown Has patient had a PCN reaction occurring within the last 10 years: Unknown If all of the above answers are "NO", then may proceed with Cephalosporin use.   . Ibuprofen Other (See Comments)    Stomach discomfort  . Keflex [Cephalexin] Swelling and Rash    Immunization History  Administered Date(s) Administered  . Influenza,inj,Quad PF,6+ Mos 03/07/2015, 03/19/2016    Past Medical History:  Diagnosis Date  . Anemia, iron deficiency 06/06/2012  . Arthritis   . Asthma   . Blood transfusion without reported diagnosis   . Cataract   . CHF (congestive heart failure) ([place]) 12/20/2015  . Chronic lymphatic leukemia ([place])   . COPD (chronic obstructive pulmonary disease) ([place])   . Diverticulitis   . Gallstones   . GERD (gastroesophageal reflux disease)   . Neuromuscular disorder ([place])   . Shingles     Tobacco  History: History  Smoking Status  . Never Smoker  Smokeless Tobacco  . Never Used   Counseling given: Not Answered   Outpatient Encounter Prescriptions as of 03/05/2017  Medication Sig  . albuterol (PROVENTIL) (2.5 MG/3ML) 0.083% nebulizer solution Take 3 mLs (2.5 mg total) by nebulization 2 (two) times daily.  Marland Kitchen albuterol (VENTOLIN HFA) 108 (90 Base) MCG/ACT inhaler INHALE  1-2 PUFFS INTO THE LUNGS EVERY 6 HOURS AS NEEDED FOR WHEEZING OR SHORTNESS OF BREATH. (Patient taking differently: Inhale 1-2 puffs into the lungs every 6 (six) hours as needed for wheezing or shortness of breath. )  . Ascorbic Acid (VITAMIN C) 1000 MG tablet Take 1,000 mg by mouth daily.  . calcitonin, salmon, (MIACALCIN/FORTICAL) 200 UNIT/ACT nasal spray Place 1 spray into alternate nostrils daily.  . DULERA 200-5 MCG/ACT AERO INHALE 2 PUFFS INTO THE LUNGS 2 (TWO) TIMES DAILY.  . furosemide (LASIX) 20 MG tablet Take 1 tablet (20 mg total) by mouth daily as needed for fluid.  Marland Kitchen gabapentin (NEURONTIN) 100 MG capsule TAKE 2 CAPSULES BY MOUTH 3 TIMES A DAY AS NEEDED FOR NERVE PAIN  . GARLIC PO Take 1 tablet by mouth daily.  Marland Kitchen HYDROcodone-acetaminophen (NORCO/VICODIN) 5-325 MG tablet Take 1/2 -1 tablets every 6 hours as needed for severe pain (Patient taking differently: Take 0.5-1 tablets by mouth every 6 (six) hours as needed for moderate pain or severe pain. )  . hydrocortisone (ANUSOL-HC) 25 MG suppository Place 1 suppository (25 mg total) rectally 2 (two) times daily. (Patient taking differently: Place 25 mg rectally 2 (two) times daily as needed for hemorrhoids. )  . hyoscyamine (LEVSIN SL) 0.125 MG SL tablet PLACE 1 TABLET (0.125 MG TOTAL) UNDER THE TONGUE EVERY 8 (EIGHT) HOURS AS NEEDED. (Patient taking differently: Take 0.125 mg by mouth every 8 (eight) hours as needed for cramping. Marland Kitchen)  . Multiple Vitamin (MULTIVITAMIN WITH MINERALS) TABS tablet Take 1 tablet by mouth daily.  . naloxegol oxalate (MOVANTIK) 12.5 MG TABS tablet Take 1 tablet (12.5 mg total) by mouth daily.  Marland Kitchen omeprazole (PRILOSEC) 20 MG capsule TAKE ONE CAPSULE BY MOUTH EVERY DAY  . polyethylene glycol powder (MIRALAX) powder Take 17 g by mouth daily.  . predniSONE (DELTASONE) 10 MG tablet TAKE 1 TABLET BY MOUTH EVERY DAY  . psyllium (METAMUCIL) 58.6 % packet Take 1 packet by mouth daily.  Marland Kitchen senna (SENOKOT) 8.6 MG TABS tablet  Take 1-2 tablets by mouth daily as needed for mild constipation.  . Simethicone (GAS-X PO) Take 2 capsules by mouth daily as needed (for gas).   . sucralfate (CARAFATE) 1 g tablet TAKE 2 TABLETS IN THE MORNING AND AT BEDTIME  . vitamin B-12 (CYANOCOBALAMIN) 500 MCG tablet Take 500 mcg by mouth daily.  . [DISCONTINUED] furosemide (LASIX) 20 MG tablet Take 1 tab every other day.   No facility-administered encounter medications on file as of 03/05/2017.      Review of Systems  Constitutional:   No  weight loss, night sweats,  Fevers, chills, + fatigue, or  lassitude.  HEENT:   No headaches,  Difficulty swallowing,  Tooth/dental problems, or  Sore throat,                No sneezing, itching, ear ache, nasal congestion, post nasal drip,   CV:  No chest pain,  Orthopnea, PND,  , anasarca, dizziness, palpitations, syncope.   GI  No heartburn, indigestion, abdominal pain, nausea, vomiting, diarrhea, change in bowel habits, loss of appetite, bloody stools.  Resp:  .  No chest wall deformity  Skin: no rash or lesions.  GU: no dysuria, change in color of urine, no urgency or frequency.  No flank pain, no hematuria   MS:  No joint pain or swelling.  No decreased range of motion.  No back pain.    Physical Exam  BP 128/78 (BP Location: Left Arm, Cuff Size: Normal)   Pulse 92   Ht 5' (1.524 m)   Wt 135 lb 4 oz (61.3 kg)   SpO2 96%   BMI 26.41 kg/m   GEN: A/Ox3; pleasant , NAD, elderly in wc    HEENT:  [place]/AT,  EACs-clear, TMs-wnl, NOSE-clear, THROAT-clear, no lesions, no postnasal drip or exudate noted.   NECK:  Supple w/ fair ROM; no JVD; normal carotid impulses w/o bruits; no thyromegaly or nodules palpated; no lymphadenopathy.    RESP  Clear  P & A; w/o, wheezes/ rales/ or rhonchi. no accessory muscle use, no dullness to percussion  CARD:  RRR, no m/r/g, 1+  peripheral edema, pulses intact, no cyanosis or clubbing.  GI:   Soft & nt; nml bowel sounds; no organomegaly or masses  detected.   Musco: Warm bil, no deformities or joint swelling noted.   Neuro: alert, no focal deficits noted.    Skin: Warm, no lesions or rashes    Lab Results:  CBC    Component Value Date/Time   WBC 74.6 (HH) 10/29/2016 1125   WBC 60.4 (HH) 08/27/2016 1023   RBC 4.54 10/29/2016 1125   RBC 4.41 08/27/2016 1023   HGB 10.3 (L) 10/29/2016 1125   HCT 32.6 (L) 10/29/2016 1125   PLT 144 (L) 10/29/2016 1125   MCV 72 (L) 10/29/2016 1125   MCH 22.7 (L) 10/29/2016 1125   MCH 22.0 (L) 08/27/2016 1023   MCHC 31.6 (L) 10/29/2016 1125   MCHC 31.5 08/27/2016 1023   RDW 15.7 10/29/2016 1125   LYMPHSABS 78.7 (H) 08/21/2016 1139   LYMPHSABS 6.0 (H) 01/25/2013 1144   MONOABS 0.0 (L) 08/21/2016 1139   EOSABS 0.9 (H) 08/21/2016 1139   EOSABS 0.1 01/25/2013 1144   BASOSABS 0.0 08/21/2016 1139   BASOSABS 0.0 01/25/2013 1144    BMET    Component Value Date/Time   NA 132 10/29/2016 1218   NA 135 (L) 03/29/2015 0908   K 3.5 10/29/2016 1218   K 3.5 03/29/2015 0908   CL 94 (L) 10/29/2016 1218   CO2 27 10/29/2016 1218   CO2 25 03/29/2015 0908   GLUCOSE 130 (H) 10/29/2016 1218   BUN 4 (L) 10/29/2016 1218   BUN 7.9 03/29/2015 0908   CREATININE 0.8 10/29/2016 1218   CREATININE 0.7 03/29/2015 0908   CALCIUM 9.0 10/29/2016 1218   CALCIUM 8.6 03/29/2015 0908   GFRNONAA >60 08/27/2016 1023   GFRAA >60 08/27/2016 1023    BNP    Component Value Date/Time   BNP 70.5 06/04/2016 1120    ProBNP    Component Value Date/Time   PROBNP 107.0 (H) 04/28/2016 1532    Imaging: No results found.   Assessment & Plan:   Asthma, moderate persistent Appears stable with no obvious flare (no increased cough or wheezing )   Plan  Cont on Dulera .   Chronic respiratory failure (HCC) Cont on O2 with act and At bedtime  .   CLL (chronic lymphocytic leukemia) ([place]) Needs follow up with Oncology as discussed   Dyspnea Chronic Dyspnea suspect is multifactoral with extensive workup in past.  She  does have chronic fixed asthma but does not appear to be decompensated. Previous CT chest w/ no PE . Chronic RML volume loss but stable since 2015 doubt this is major contributing factor . She has CLL with very high wbc , recent CT abd and ABD [place] did not show sign spleenomegaly .  May have component of Diastolic dysfunction with mild fluid overload with LE edema on exam  Trial of low dose Lasix Check labs   Plan  Patient Instructions  Begin Lasix 20mg  daily for  5 days , then daily As needed  For leg swelling .  Labs today .  Continue on Dulera 2 puffs Twice daily  , rinse well after it.   Continue on Oxygen 2l/m with activity and At bedtime   Follow up with Dr. [name] as discussed  follow up Dr. Elsworth Soho  In  1  months at [place] [facility] .  Please contact office for sooner follow up if symptoms do not improve or worsen or seek emergency care         Rexene Edison, NP 03/05/2017

## 2017-03-05 NOTE — Telephone Encounter (Signed)
Patient previously was trying to switch to Dr. Nani Ravens. But then it appears she never showed for that appointment. I keep getting notes regarding her care from specialist. We need to contact her family or patient herself and see if she wants to continue to see me or switch to Dr. Nani Ravens. Please have them schedule appointment with Dr. Nani Ravens.  Basically I will plan to see provider who they requested rather than myself. If she shows up and needs care will be happy to see her.

## 2017-03-05 NOTE — Assessment & Plan Note (Signed)
Appears stable with no obvious flare (no increased cough or wheezing )   Plan  Cont on Dulera .

## 2017-03-08 NOTE — Progress Notes (Signed)
Reviewed & agree with plan  

## 2017-03-09 NOTE — Telephone Encounter (Signed)
Patient confirmed she would like Dr. Nani Ravens as her PCP, telephone note was sent 08/17/16 patient chart has been updated.

## 2017-03-16 DIAGNOSIS — R351 Nocturia: Secondary | ICD-10-CM | POA: Diagnosis not present

## 2017-03-16 DIAGNOSIS — N321 Vesicointestinal fistula: Secondary | ICD-10-CM | POA: Diagnosis not present

## 2017-03-16 DIAGNOSIS — N390 Urinary tract infection, site not specified: Secondary | ICD-10-CM | POA: Diagnosis not present

## 2017-03-20 DIAGNOSIS — K5900 Constipation, unspecified: Secondary | ICD-10-CM | POA: Diagnosis not present

## 2017-03-20 DIAGNOSIS — M6281 Muscle weakness (generalized): Secondary | ICD-10-CM | POA: Diagnosis not present

## 2017-03-20 DIAGNOSIS — J449 Chronic obstructive pulmonary disease, unspecified: Secondary | ICD-10-CM | POA: Diagnosis not present

## 2017-03-20 DIAGNOSIS — R1030 Lower abdominal pain, unspecified: Secondary | ICD-10-CM | POA: Diagnosis not present

## 2017-03-25 ENCOUNTER — Other Ambulatory Visit: Payer: Self-pay | Admitting: Family

## 2017-03-29 DIAGNOSIS — N321 Vesicointestinal fistula: Secondary | ICD-10-CM | POA: Diagnosis not present

## 2017-04-08 ENCOUNTER — Encounter: Payer: Self-pay | Admitting: Family Medicine

## 2017-04-08 ENCOUNTER — Ambulatory Visit (INDEPENDENT_AMBULATORY_CARE_PROVIDER_SITE_OTHER): Payer: Medicare Other | Admitting: Family Medicine

## 2017-04-08 VITALS — BP 104/62 | HR 90 | Temp 98.7°F | Ht 60.0 in | Wt 137.0 lb

## 2017-04-08 DIAGNOSIS — Z23 Encounter for immunization: Secondary | ICD-10-CM

## 2017-04-08 DIAGNOSIS — Z8679 Personal history of other diseases of the circulatory system: Secondary | ICD-10-CM | POA: Diagnosis not present

## 2017-04-08 DIAGNOSIS — R300 Dysuria: Secondary | ICD-10-CM

## 2017-04-08 DIAGNOSIS — R0609 Other forms of dyspnea: Secondary | ICD-10-CM | POA: Diagnosis not present

## 2017-04-08 DIAGNOSIS — R5381 Other malaise: Secondary | ICD-10-CM | POA: Diagnosis not present

## 2017-04-08 LAB — POC URINALSYSI DIPSTICK (AUTOMATED)
Bilirubin, UA: NEGATIVE
Blood, UA: NEGATIVE
KETONES UA: NEGATIVE
Nitrite, UA: NEGATIVE
Protein, UA: NEGATIVE
SPEC GRAV UA: 1.015 (ref 1.010–1.025)
Urobilinogen, UA: 0.2 E.U./dL
pH, UA: 6 (ref 5.0–8.0)

## 2017-04-08 MED ORDER — NITROFURANTOIN MONOHYD MACRO 100 MG PO CAPS: 100.0000 mg | ORAL_CAPSULE | Freq: Two times a day (BID) | ORAL | 0 refills | Status: AC

## 2017-04-08 NOTE — Addendum Note (Signed)
Addended by: Sharon Seller B on: 04/08/2017 10:49 AM   Modules accepted: Orders

## 2017-04-08 NOTE — Patient Instructions (Addendum)
I will send a MyChart message on Friday or Saturday on whether to stop antibiotic. Take it for now.   If you do not hear anything about your home physical therapy or cardiology appointment in the next 1-2 weeks, call our office and ask for an update.  If we start having worsening symptoms, altered mental status, or fevers, seek immediate care.

## 2017-04-08 NOTE — Progress Notes (Signed)
Pre visit review using our clinic review tool, if applicable. No additional management support is needed unless otherwise documented below in the visit note. 

## 2017-04-08 NOTE — Progress Notes (Addendum)
Chief Complaint  Patient presents with  . Dysuria    Stefanie Henry is a 83 y.o. female here for possible UTI. Here with son who helps translate.   Duration: several months; has hx of recto-vesicular fistula, discussed surgical options with urology and GS, opted to watchfully wait Symptoms: urinary frequency and dysuria Denies: hematuria, urinary hesitancy, urinary retention, fever, nausea and vomiting, vaginal discharge Hx of recurrent UTI? No Denies new sexual partners.  Notes some swelling and pain in R forearm. Grandson will intermittently grab her arm. Has been using Lasix for hx of CHF, but it is not helpful with this issue. She does not follow with a cardiologist and has been experiencing DOE that affects her exertional abilities. Sees pulm, no concerns from their end. No recent wt changes.  She is not normally ambulatory. Son believes she is getting weaker. Interested in home PT.   ROS:  Constitutional: denies fever GU: As noted in HPI  Past Medical History:  Diagnosis Date  . Anemia, iron deficiency 06/06/2012  . Arthritis   . Asthma   . Blood transfusion without reported diagnosis   . Cataract   . CHF (congestive heart failure) ([place]) 12/20/2015  . Chronic lymphatic leukemia ([place])   . COPD (chronic obstructive pulmonary disease) ([place])   . Diverticulitis   . Gallstones   . GERD (gastroesophageal reflux disease)   . Neuromuscular disorder ([place])   . Shingles    Family History  Problem Relation Age of Onset  . Diabetes Father   . Stomach cancer Sister   . Gallbladder disease Mother   . Colon cancer Neg Hx   . Colon polyps Neg Hx   . Esophageal cancer Neg Hx    Social History   Social History  . Marital status: Married   Occupational History  . Retired    Social History Main Topics  . Smoking status: Never Smoker  . Smokeless tobacco: Never Used  . Alcohol use No  . Drug use: No    BP 104/62 (BP Location: Left Arm, Patient Position: Sitting, Cuff Size:  Normal)   Pulse 90   Temp 98.7 F (37.1 C) (Oral)   Ht 5' (1.524 m)   Wt 137 lb (62.1 kg)   SpO2 97%   BMI 26.76 kg/m  General: Awake, alert, appears stated age HEENT: MMM Heart: RRR, no murmurs, 2+ pitting edema b/l tapering to prox 1/3 of tibia Lungs: CTAB, normal respiratory effort, no accessory muscle usage Abd: BS+, soft, NT, ND, no masses or organomegaly MSK: No CVA tenderness, neg Lloyd's sign; +TTP over distal R forearm where there is localized non-pitting edema Psych: Age appropriate judgment and insight  Dysuria - Plan: nitrofurantoin, macrocrystal-monohydrate, (MACROBID) 100 MG capsule  Need for influenza vaccination - Plan: Flu vaccine HIGH DOSE PF (Fluzone High dose)  Exertional dyspnea - Plan: Ambulatory referral to Cardiology  History of heart failure - Plan: Ambulatory referral to Cardiology  Physical deconditioning - Plan: Ambulatory referral to Physical Therapy  Orders as above. Renal function reviewed.  Will send [facility] message if culture neg. OK with holding off on surgery if she is not having many issues with it. If starting to have fevers, AMS, or worsening symptoms, seek care. Refer to cardiology with dyspnea and hx of CHF.  Ice for arm pain.  Will try to set up home PT for her deconditioning.  F/u prn for this.  The patient and her son voiced understanding and agreement to the plan.  Crosby Oyster [place],  DO 04/08/17 10:23 AM

## 2017-04-10 LAB — URINE CULTURE
MICRO NUMBER: 81134707
SPECIMEN QUALITY: ADEQUATE

## 2017-04-12 ENCOUNTER — Ambulatory Visit: Payer: Medicare Other | Admitting: Family Medicine

## 2017-04-19 ENCOUNTER — Encounter: Payer: Self-pay | Admitting: *Deleted

## 2017-04-26 DIAGNOSIS — R0602 Shortness of breath: Secondary | ICD-10-CM | POA: Insufficient documentation

## 2017-04-26 DIAGNOSIS — I2510 Atherosclerotic heart disease of native coronary artery without angina pectoris: Secondary | ICD-10-CM | POA: Insufficient documentation

## 2017-04-26 NOTE — Progress Notes (Signed)
Cardiology Office Note:    Date:  04/27/2017   ID:  Stefanie Henry, DOB 1930/02/27, MRN [id number]  PCP:  Shelda Pal, DO  Cardiologist:  Shirlee More, MD   Referring MD: Shelda Pal*  ASSESSMENT:    1. 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 SOB (shortness of breath)    PLAN:    In order of problems listed above:  1. Decompensated she is fluid overloaded and we will begin with sodium restriction with taking her diuretic daily and reassess ejection fraction and diastolic parameters by echocardiogram.  At this time I do not think she needs an ischemia evaluation. 2. Multifactorial underlying lung disease CLL anemia and debilitation.  I hope by improving decompensated heart failure can improve her quality of life  Next appointment: 3 weeks   Medication Adjustments/Labs and Tests Ordered: Current medicines are reviewed at length with the patient today.  Concerns regarding medicines are outlined above.  Orders Placed This Encounter  Procedures  . EKG 12-Lead  . ECHOCARDIOGRAM COMPLETE   Meds ordered this encounter  Medications  . torsemide (DEMADEX) 20 MG tablet    Sig: Take 1 tablet (20 mg total) by mouth 2 (two) times daily.    Dispense:  180 tablet    Refill:  3     Chief Complaint  Patient presents with  . Shortness of Breath    History of Present Illness:    Stefanie Henry is a 83 y.o. female with a history of CLL,asthma, chronic hypoxic respiratory failure on ambulatory oxygen, chronic RML collapse and heart failure  who is being seen today for the evaluation of SOB at the request of Shelda Pal*. The diagnosis of heart failure was based uppon SOB and edema, she is on gabapentin which favors sodium retention /edema and furosemide low dose.  Echo 10/19/14: Study Conclusions - Left ventricle: The cavity size was normal. Wall thickness was                  LA normal size, no MR, PA pressure  normal    increased in a pattern of mild LVH. Systolic function was normal.               BNP 92 03/25/17   The estimated ejection fraction was in the range of 50% to 55%.   Wall motion was normal; there were no regional wall motion   abnormalities. Doppler parameters are consistent with abnormal   left ventricular relaxation (grade 1 diastolic dysfunction).  She adds salt to her food and takes a diuretic intermittently, she also uses her home oxygen as needed, now approx 60% of the time. She has severe SOB with minor activity but can do her own ADL without assistance and has no orthopnea or PND. Recently increased edema and SOB even at rest.  She has no history of pericardial congenital or rheumatic heart disease.  She has coronary calcification but not CAD and does not experience angina.  Goals for the patient and her son are to be comfortable functional.  She has agreed to sodium restrict to take a diuretic daily.  We will repeat her echocardiogram but I suspect what she has here is diastolic heart failure which is decompensated.  I do not think that an ischemia evaluation would be of benefit at this time. Past Medical History:  Diagnosis Date  . Abnormal LFTs (liver function tests)   . Abscess   . Abscess, abdomen  10/16/2014  . Anemia, iron deficiency 06/06/2012  . Arthritis   . Asthma   . Asthma, moderate persistent 08/27/2015   With chronic airway obstruction  . Back pain 06/09/2014  . Blood transfusion without reported diagnosis   . Cataract   . CHF (congestive heart failure) ([place]) 12/20/2015  . Chronic lymphatic leukemia ([place])   . Chronic lymphocytic leukemia ([place]) 06/06/2012  . Chronic respiratory failure ([place]) 12/02/2015  . CLL (chronic lymphocytic leukemia) ([place])   . Colovesical fistula   . Constipation   . COPD (chronic obstructive pulmonary disease) ([place])   . Dependent edema 12/18/2015  . Diverticulitis   . Diverticulitis of colon 12/04/2014  . Diverticulitis of large intestine  with abscess without bleeding   . Diverticulitis of large intestine with perforation and abscess without bleeding 10/16/2014  . Dysphagia   . Dyspnea 10/05/2014   Followed in Pulmonary clinic/ [facility]/ [place] - 10/05/2014   Walked RA x one lap @ 185 stopped due to fatigue >> sob/ no desats/ slow pace - Spirometry 10/05/2014 > min airflow obst    . Early satiety   . Fall   . Gallstones   . Gallstones without obstruction of gallbladder   . Gastric polyps   . GERD (gastroesophageal reflux disease)   . Hyponatremia 10/16/2014  . Labral tear of left hip joint 08/13/2016  . Left hip pain   . Lumbar compression fracture ([place]) 06/08/2014  . Malnutrition of moderate degree ([place]) 11/02/2014  . Midline low back pain without sciatica   . Neuromuscular disorder ([place])   . Shingles   . Tear of left acetabular labrum 08/12/2016  . Thrombocytopenia ([place]) 10/16/2014  . Unable to bear weight 08/12/2016  . UTI (urinary tract infection) 12/13/2015    Past Surgical History:  Procedure Laterality Date  . ESOPHAGOGASTRODUODENOSCOPY N/A 11/07/2014   Procedure: ESOPHAGOGASTRODUODENOSCOPY (EGD);  Surgeon: Gatha Mayer, MD;  Location: [facility];  Service: Endoscopy;  Laterality: N/A;  . none      Current Medications: Current Meds  Medication Sig  . albuterol (PROVENTIL) (2.5 MG/3ML) 0.083% nebulizer solution Take 3 mLs (2.5 mg total) by nebulization 2 (two) times daily.  Marland Kitchen albuterol (VENTOLIN HFA) 108 (90 Base) MCG/ACT inhaler INHALE 1-2 PUFFS INTO THE LUNGS EVERY 6 HOURS AS NEEDED FOR WHEEZING OR SHORTNESS OF BREATH. (Patient taking differently: Inhale 1-2 puffs into the lungs every 6 (six) hours as needed for wheezing or shortness of breath. )  . Ascorbic Acid (VITAMIN C) 1000 MG tablet Take 1,000 mg by mouth daily.  . calcitonin, salmon, (MIACALCIN/FORTICAL) 200 UNIT/ACT nasal spray Place 1 spray into alternate nostrils daily.  . DULERA 200-5 MCG/ACT AERO INHALE 2 PUFFS INTO THE LUNGS 2 (TWO) TIMES  DAILY.  Marland Kitchen gabapentin (NEURONTIN) 100 MG capsule TAKE 2 CAPSULES BY MOUTH 3 TIMES A DAY AS NEEDED FOR NERVE PAIN  . GARLIC PO Take 1 tablet by mouth daily.  Marland Kitchen HYDROcodone-acetaminophen (NORCO/VICODIN) 5-325 MG tablet Take 1/2 -1 tablets every 6 hours as needed for severe pain (Patient taking differently: Take 0.5-1 tablets by mouth every 6 (six) hours as needed for moderate pain or severe pain. )  . hydrocortisone (ANUSOL-HC) 25 MG suppository Place 1 suppository (25 mg total) rectally 2 (two) times daily. (Patient taking differently: Place 25 mg rectally 2 (two) times daily as needed for hemorrhoids. )  . hyoscyamine (LEVSIN SL) 0.125 MG SL tablet PLACE 1 TABLET (0.125 MG TOTAL) UNDER THE TONGUE EVERY 8 (EIGHT) HOURS AS NEEDED. (Patient taking differently:  Take 0.125 mg by mouth every 8 (eight) hours as needed for cramping. Marland Kitchen)  . Multiple Vitamin (MULTIVITAMIN WITH MINERALS) TABS tablet Take 1 tablet by mouth daily.  . naloxegol oxalate (MOVANTIK) 12.5 MG TABS tablet Take 1 tablet (12.5 mg total) by mouth daily.  Marland Kitchen omeprazole (PRILOSEC) 20 MG capsule TAKE ONE CAPSULE BY MOUTH EVERY DAY  . polyethylene glycol powder (MIRALAX) powder Take 17 g by mouth daily.  . predniSONE (DELTASONE) 10 MG tablet TAKE 1 TABLET BY MOUTH EVERY DAY  . psyllium (METAMUCIL) 58.6 % packet Take 1 packet by mouth daily.  Marland Kitchen senna (SENOKOT) 8.6 MG TABS tablet Take 1-2 tablets by mouth daily as needed for mild constipation.  . Simethicone (GAS-X PO) Take 2 capsules by mouth daily as needed (for gas).   . sucralfate (CARAFATE) 1 g tablet TAKE 2 TABLETS IN THE MORNING AND AT BEDTIME  . vitamin B-12 (CYANOCOBALAMIN) 500 MCG tablet Take 500 mcg by mouth daily.  . [DISCONTINUED] furosemide (LASIX) 20 MG tablet Take 1 tablet (20 mg total) by mouth daily as needed for fluid.     Allergies:   Citrus; Other; Augmentin [amoxicillin-pot clavulanate]; Ibuprofen; and Keflex [cephalexin]   Social History   Social History  . Marital  status: Married    Spouse name: N/A  . Number of children: 2  . Years of education: N/A   Occupational History  . Retired    Social History Main Topics  . Smoking status: Never Smoker  . Smokeless tobacco: Never Used  . Alcohol use No  . Drug use: No  . Sexual activity: Not Asked   Other Topics Concern  . None   Social History Narrative  . None     Family History: The patient's family history includes Diabetes in her father; Gallbladder disease in her mother; Stomach cancer in her sister. There is no history of Colon cancer, Colon polyps, or Esophageal cancer.  ROS:   Review of Systems  Constitution: Positive for weakness and malaise/fatigue. Negative for chills, decreased appetite, diaphoresis, fever, night sweats, weight gain and weight loss.  HENT: Positive for hearing loss. Negative for congestion, ear discharge, ear pain, hoarse voice, nosebleeds, odynophagia, sore throat, stridor and tinnitus.   Eyes: Negative.   Cardiovascular: Positive for dyspnea on exertion and leg swelling (and arms). Negative for chest pain, claudication, cyanosis, irregular heartbeat, near-syncope, orthopnea, palpitations and paroxysmal nocturnal dyspnea.  Respiratory: Positive for shortness of breath. Negative for cough, hemoptysis, sleep disturbances due to breathing, snoring, sputum production and wheezing.   Endocrine: Negative.   Hematologic/Lymphatic: Negative for adenopathy and bleeding problem. Bruises/bleeds easily.  Skin: Positive for rash (arms).  Musculoskeletal: Positive for joint pain and myalgias.  Gastrointestinal: Positive for constipation and diarrhea.  Genitourinary: Positive for frequency.  Neurological: Negative for aphonia, brief paralysis, difficulty with concentration, disturbances in coordination, excessive daytime sleepiness, dizziness, focal weakness, headaches, light-headedness, loss of balance, numbness, paresthesias, seizures and sensory change.    Psychiatric/Behavioral: Negative.   Allergic/Immunologic: Negative.    Please see the history of present illness.      EKGs/Labs/Other Studies Reviewed:    The following studies were reviewed today:   EKG:  EKG is  ordered today.  The ekg ordered today demonstrates sinus rhythm, normal  CTA chest 12/09/16: IMPRESSION: Chronic right middle lobe collapse. Etiology indeterminate. I do not see a central endobronchial lesion. Bronchoscopy may be indicated. Lungs are otherwise clear. Cardiomegaly. Coronary artery calcification. Aortic atherosclerosis. Old compression fracture at L1. Newly seen compression  fracture at T12 with loss of height of 10-20%. This was not present on chest radiography of 04/28/2016. Somewhat irregular appearance of the bone at T12, which could be a response to healing. However, underlying tumor is not excluded. Consider MRI of the thoracic and upper lumbar spine to evaluate this further.  Recent Labs: 03/05/17 Hgb 10.4 06/04/2016: Brain Natriuretic Peptide 70.5 10/29/2016: ALT(SGPT) 16 03/05/2017: BUN 10; Creatinine, Ser 0.71; Hemoglobin 10.4; Platelets 107.0; Potassium 4.0; Pro B Natriuretic peptide (BNP) 92.0; Sodium 130    Physical Exam:    VS:  BP (!) 88/62 (BP Location: Left Arm, Patient Position: Sitting, Cuff Size: Normal)   Pulse 88   Ht 5' (1.524 m)   Wt 130 lb (59 kg) Comment: Verbal from husband, pt unable to stand on scale  SpO2 97%   BMI 25.39 kg/m     Wt Readings from Last 3 Encounters:  04/27/17 130 lb (59 kg)  04/08/17 137 lb (62.1 kg)  03/05/17 135 lb 4 oz (61.3 kg)     GEN: looks chronically ill debilitated breathless at rest HEENT: Normal NECK: mild JVD no HJR; No carotid bruits LYMPHATICS: No lymphadenopathy CARDIAC:RRR, no murmurs, rubs, gallops RESPIRATORY:  Diffusely diminished, no rales or wheezing ABDOMEN: Soft, non-tender, non-distended MUSCULOSKELETAL: 4+ lower extremity and 1-2+ sacral edema edema; No deformity  SKIN:  Warm and dry NEUROLOGIC:  Alert and oriented x 3 PSYCHIATRIC:  Normal affect     Signed, Shirlee More, MD  04/27/2017 10:07 AM    [place]

## 2017-04-27 ENCOUNTER — Ambulatory Visit (INDEPENDENT_AMBULATORY_CARE_PROVIDER_SITE_OTHER): Payer: Medicare Other | Admitting: Cardiology

## 2017-04-27 ENCOUNTER — Encounter: Payer: Self-pay | Admitting: Cardiology

## 2017-04-27 VITALS — BP 88/62 | HR 88 | Ht 60.0 in | Wt 130.0 lb

## 2017-04-27 DIAGNOSIS — I509 Heart failure, unspecified: Secondary | ICD-10-CM | POA: Diagnosis not present

## 2017-04-27 DIAGNOSIS — I38 Endocarditis, valve unspecified: Secondary | ICD-10-CM | POA: Diagnosis not present

## 2017-04-27 DIAGNOSIS — R0602 Shortness of breath: Secondary | ICD-10-CM

## 2017-04-27 MED ORDER — TORSEMIDE 20 MG PO TABS: 20.0000 mg | ORAL_TABLET | Freq: Two times a day (BID) | ORAL | 3 refills | Status: DC

## 2017-04-27 MED ORDER — TORSEMIDE 20 MG PO TABS: 20.0000 mg | ORAL_TABLET | Freq: Every day | ORAL | 3 refills | Status: DC

## 2017-04-27 MED ORDER — TORSEMIDE 20 MG PO TABS: 20.0000 mg | ORAL_TABLET | Freq: Every day | ORAL | 3 refills | Status: AC

## 2017-04-27 NOTE — Patient Instructions (Addendum)
Medication Instructions:  Your physician has recommended you make the following change in your medication:  STOP furosemide START torsemide 20 mg daily  Labwork: None  Testing/Procedures: You had an EKG today.  Your physician has requested that you have an echocardiogram. Echocardiography is a painless test that uses sound waves to create images of your heart. It provides your doctor with information about the size and shape of your heart and how well your heart's chambers and valves are working. This procedure takes approximately one hour. There are no restrictions for this procedure.  Follow-Up: Your physician recommends that you schedule a follow-up appointment in: 3 weeks.  Any Other Special Instructions Will Be Listed Below (If Applicable).     If you need a refill on your cardiac medications before your next appointment, please call your pharmacy.    Heart Failure  Weigh yourself every morning when you first wake up and record on a calender or note pad, bring this to your office visits. Using a pill tender can help with taking your medications consistently.  Limit your fluid intake to 2 liters daily  Limit your sodium intake to less than 2-3 grams daily. Ask if you need dietary teaching.  If you gain more than 3 pounds (from your dry weight ), double your dose of diuretic for the day.  If you gain more than 5 pounds (from your dry weight), double your dose of lasix and call your heart failure doctor.  Please do not smoke tobacco since it is very bad for your heart.  Please do not drink alcohol since it can worsen your heart failure.Also avoid OTC nonsteroidal drugs, such as advil, aleve and motrin.  Try to exercise for at least 30 minutes every day because this will help your heart be more efficient. You may be eligible for supervised cardiac rehab, ask your physician.     Call your oxygen provider for a portable shoulder device

## 2017-05-03 ENCOUNTER — Other Ambulatory Visit: Payer: Self-pay | Admitting: Family

## 2017-05-19 ENCOUNTER — Ambulatory Visit (HOSPITAL_BASED_OUTPATIENT_CLINIC_OR_DEPARTMENT_OTHER)
Admission: RE | Admit: 2017-05-19 | Discharge: 2017-05-19 | Disposition: A | Discharge status: Home / Self Care | Payer: Medicare Other | Source: Ambulatory Visit | Attending: Cardiology | Admitting: Cardiology

## 2017-05-19 DIAGNOSIS — R0602 Shortness of breath: Secondary | ICD-10-CM

## 2017-05-19 DIAGNOSIS — I5030 Unspecified diastolic (congestive) heart failure: Secondary | ICD-10-CM | POA: Diagnosis not present

## 2017-05-19 NOTE — Progress Notes (Signed)
Echocardiogram 2D Echocardiogram has been performed.  Stefanie Henry 05/19/2017, 11:27 AM

## 2017-05-24 ENCOUNTER — Encounter: Payer: Self-pay | Admitting: Cardiology

## 2017-05-24 ENCOUNTER — Telehealth: Payer: Self-pay | Admitting: Family Medicine

## 2017-05-24 ENCOUNTER — Ambulatory Visit (INDEPENDENT_AMBULATORY_CARE_PROVIDER_SITE_OTHER): Payer: Medicare Other | Admitting: Cardiology

## 2017-05-24 VITALS — BP 100/50 | HR 96 | Ht 60.0 in | Wt 139.5 lb

## 2017-05-24 DIAGNOSIS — R5381 Other malaise: Secondary | ICD-10-CM

## 2017-05-24 DIAGNOSIS — R0602 Shortness of breath: Secondary | ICD-10-CM

## 2017-05-24 DIAGNOSIS — M7989 Other specified soft tissue disorders: Secondary | ICD-10-CM | POA: Diagnosis not present

## 2017-05-24 DIAGNOSIS — I5032 Chronic diastolic (congestive) heart failure: Secondary | ICD-10-CM | POA: Diagnosis not present

## 2017-05-24 NOTE — Patient Instructions (Addendum)
Medication Instructions:  Your physician recommends that you continue on your current medications as directed. Please refer to the Current Medication list given to you today.  Labwork: None  Testing/Procedures: Your physician has requested that you have an upper extremity venous duplex. This test is an ultrasound of the veins in the arms. It looks at venous blood flow that carries blood from the heart to the arms. Allow thirty minutes for an Upper Venous exam. There are no restrictions or special instructions.  Follow-Up: Your physician recommends that you schedule a follow-up appointment in: 6 weeks.  Any Other Special Instructions Will Be Listed Below (If Applicable).     If you need a refill on your cardiac medications before your next appointment, please call your pharmacy.    Heart Failure  Weigh yourself every morning when you first wake up and record on a calender or note pad, bring this to your office visits. Using a pill tender can help with taking your medications consistently.  Limit your fluid intake to 2 liters daily  Limit your sodium intake to less than 2-3 grams daily. Ask if you need dietary teaching.  If you gain more than 3 pounds (from your dry weight ), double your dose of diuretic for the day.  If you gain more than 5 pounds (from your dry weight), double your dose of lasix and call your heart failure doctor.  Please do not smoke tobacco since it is very bad for your heart.  Please do not drink alcohol since it can worsen your heart failure.Also avoid OTC nonsteroidal drugs, such as advil, aleve and motrin.  Try to exercise for at least 30 minutes every day because this will help your heart be more efficient. You may be eligible for supervised cardiac rehab, ask your physician.

## 2017-05-24 NOTE — Telephone Encounter (Signed)
Pt needs referral to home health pysical therapy.   Please advise

## 2017-05-24 NOTE — Progress Notes (Signed)
Cardiology Office Note:    Date:  05/24/2017   ID:  Stefanie Henry, DOB [birth year], MRN [id number]  PCP:  Shelda Pal, DO  Cardiologist:  Shirlee More, MD    Referring MD: Shelda Pal*    ASSESSMENT:    1. SOB (shortness of breath)   2. Chronic diastolic heart failure ([place])   3. Arm swelling    PLAN:    In order of problems listed above:  1. Subjectively she is unimproved objectively she has much less edema no rales and I think the element of her shortness of breath that is heart failure is improved.  I stressed the need with compliance of taking her diuretic daily asked them to weigh daily and will see her back in the office in 6 weeks. 2. Improved very little volume overload 1+ edema continue her current diuretic and she can break it in half and take it twice daily for compliance 3. This is the primary concern with the patient and her family she has untreated CLL has unilateral swelling and pain and warmth in the right upper extremity will have a venous duplex performed.  If this is normal we will direct her back to her primary care physician.   Next appointment: 6 weeks   Medication Adjustments/Labs and Tests Ordered: Current medicines are reviewed at length with the patient today.  Concerns regarding medicines are outlined above.  No orders of the defined types were placed in this encounter.  No orders of the defined types were placed in this encounter.   Chief Complaint  Patient presents with  . Follow-up    Echo  . Shortness of Breath  . Edema    Rt arm  . Congestive Heart Failure    History of Present Illness:    Stefanie Henry is a 83 y.o. female with a hx of CLL,asthma, chronic hypoxic respiratory failure on ambulatory oxygen, chronic RML collapse and diastolic heart failure  last seen 3 weeks ago.  04/27/17:  ASSESSMENT:    1. Heart failure due to valvular disease, unspecified heart failure type ([place])   2. SOB (shortness of  breath)    PLAN:    In order of problems listed above: 1. Decompensated she is fluid overloaded and we will begin with sodium restriction with taking her diuretic daily and reassess ejection fraction and diastolic parameters by echocardiogram.  At this time I do not think she needs an ischemia evaluation. 2. Multifactorial underlying lung disease CLL anemia and debilitation.  I hope by improving decompensated heart failure can improve her quality of life   Compliance with diet, lifestyle and medications: No, she is taking her diuretic sporadically.  The family does have her sodium intake under control.  She remains short of breath with activities indoors but no orthopnea or PND. Past Medical History:  Diagnosis Date  . Abnormal LFTs (liver function tests)   . Abscess   . Abscess, abdomen 10/16/2014  . Anemia, iron deficiency 06/06/2012  . Arthritis   . Asthma   . Asthma, moderate persistent 08/27/2015   With chronic airway obstruction  . Back pain 06/09/2014  . Blood transfusion without reported diagnosis   . Cataract   . CHF (congestive heart failure) ([place]) 12/20/2015  . Chronic lymphatic leukemia ([place])   . Chronic lymphocytic leukemia ([place]) 06/06/2012  . Chronic respiratory failure ([place]) 12/02/2015  . CLL (chronic lymphocytic leukemia) ([place])   . Colovesical fistula   . Constipation   . COPD (chronic obstructive pulmonary  disease) ([place])   . Dependent edema 12/18/2015  . Diverticulitis   . Diverticulitis of colon 12/04/2014  . Diverticulitis of large intestine with abscess without bleeding   . Diverticulitis of large intestine with perforation and abscess without bleeding 10/16/2014  . Dysphagia   . Dyspnea 10/05/2014   Followed in Pulmonary clinic/ [facility]/ [place] - 10/05/2014   Walked RA x one lap @ 185 stopped due to fatigue >> sob/ no desats/ slow pace - Spirometry 10/05/2014 > min airflow obst    . Early satiety   . Fall   . Gallstones   . Gallstones without obstruction of  gallbladder   . Gastric polyps   . GERD (gastroesophageal reflux disease)   . Hyponatremia 10/16/2014  . Labral tear of left hip joint 08/13/2016  . Left hip pain   . Lumbar compression fracture ([place]) 06/08/2014  . Malnutrition of moderate degree ([place]) 11/02/2014  . Midline low back pain without sciatica   . Neuromuscular disorder ([place])   . Shingles   . Tear of left acetabular labrum 08/12/2016  . Thrombocytopenia ([place]) 10/16/2014  . Unable to bear weight 08/12/2016  . UTI (urinary tract infection) 12/13/2015    Past Surgical History:  Procedure Laterality Date  . ESOPHAGOGASTRODUODENOSCOPY N/A 11/07/2014   Procedure: ESOPHAGOGASTRODUODENOSCOPY (EGD);  Surgeon: Gatha Mayer, MD;  Location: [facility];  Service: Endoscopy;  Laterality: N/A;  . none      Current Medications: Current Meds  Medication Sig  . Ascorbic Acid (VITAMIN C) 1000 MG tablet Take 1,000 mg by mouth daily.  . calcitonin, salmon, (MIACALCIN/FORTICAL) 200 UNIT/ACT nasal spray Place 1 spray into alternate nostrils daily.  . DULERA 200-5 MCG/ACT AERO INHALE 2 PUFFS INTO THE LUNGS 2 (TWO) TIMES DAILY.  Marland Kitchen gabapentin (NEURONTIN) 100 MG capsule TAKE 2 CAPSULES BY MOUTH 3 TIMES A DAY AS NEEDED FOR NERVE PAIN  . GARLIC PO Take 1 tablet by mouth daily.  . hyoscyamine (LEVSIN SL) 0.125 MG SL tablet PLACE 1 TABLET (0.125 MG TOTAL) UNDER THE TONGUE EVERY 8 (EIGHT) HOURS AS NEEDED. (Patient taking differently: Take 0.125 mg by mouth every 8 (eight) hours as needed for cramping. Marland Kitchen)  . Multiple Vitamin (MULTIVITAMIN WITH MINERALS) TABS tablet Take 1 tablet by mouth daily.  Marland Kitchen omeprazole (PRILOSEC) 20 MG capsule TAKE ONE CAPSULE BY MOUTH EVERY DAY  . polyethylene glycol powder (MIRALAX) powder Take 17 g by mouth daily.  . predniSONE (DELTASONE) 10 MG tablet TAKE 1 TABLET BY MOUTH EVERY DAY  . psyllium (METAMUCIL) 58.6 % packet Take 1 packet by mouth daily.  Marland Kitchen senna (SENOKOT) 8.6 MG TABS tablet Take 1-2 tablets by mouth daily as needed  for mild constipation.  . Simethicone (GAS-X PO) Take 2 capsules by mouth daily as needed (for gas).   . sucralfate (CARAFATE) 1 g tablet TAKE 2 TABLETS IN THE MORNING AND AT BEDTIME  . torsemide (DEMADEX) 20 MG tablet Take 1 tablet (20 mg total) by mouth daily.  . vitamin B-12 (CYANOCOBALAMIN) 500 MCG tablet Take 500 mcg by mouth daily.     Allergies:   Citrus; Other; Augmentin [amoxicillin-pot clavulanate]; Ibuprofen; and Keflex [cephalexin]   Social History   Socioeconomic History  . Marital status: Married    Spouse name: None  . Number of children: 2  . Years of education: None  . Highest education level: None  Social Needs  . Financial resource strain: None  . Food insecurity - worry: None  . Food insecurity - inability: None  .  Transportation needs - medical: None  . Transportation needs - non-medical: None  Occupational History  . Occupation: Retired  Tobacco Use  . Smoking status: Never Smoker  . Smokeless tobacco: Never Used  Substance and Sexual Activity  . Alcohol use: No    Alcohol/week: 0.0 oz  . Drug use: No  . Sexual activity: None  Other Topics Concern  . None  Social History Narrative  . None     Family History: The patient's family history includes Diabetes in her father; Gallbladder disease in her mother; Stomach cancer in her sister. There is no history of Colon cancer, Colon polyps, or Esophageal cancer. ROS:   Please see the history of present illness.    All other systems reviewed and are negative.  EKGs/Labs/Other Studies Reviewed:    The following studies were reviewed today:    06/04/2016: Brain Natriuretic Peptide 70.5 10/29/2016: ALT(SGPT) 16 03/05/2017: BUN 10; Creatinine, Ser 0.71; Hemoglobin 10.4; Platelets 107.0; Potassium 4.0; Pro B Natriuretic peptide (BNP) 92.0; Sodium 130   No results found for: CHOL, TRIG, HDL, CHOLHDL, VLDL, LDLCALC, LDLDIRECT  Physical Exam:    VS:  BP (!) 100/50 (BP Location: Left Arm, Patient Position:  Sitting, Cuff Size: Normal)   Pulse 96   Ht 5' (1.524 m)   Wt 130 lb (59 kg) Comment: Pulled from last visit/ not able to stand  SpO2 98%   BMI 25.39 kg/m     Wt Readings from Last 3 Encounters:  05/24/17 130 lb (59 kg)  04/27/17 130 lb (59 kg)  04/08/17 137 lb (62.1 kg)     GEN: She is not short of breath at rest, the right forearm is tense warm tender feel cystic above the wrist she has intact pulse and no evidence of ischemia of the right hand.  Well nourished, well developed in no acute distress HEENT: Normal NECK: No JVD; No carotid bruits LYMPHATICS: No lymphadenopathy CARDIAC: RRR, no murmurs, rubs, gallops RESPIRATORY:  Clear to auscultation without rales, wheezing or rhonchi  ABDOMEN: Soft, non-tender, non-distended MUSCULOSKELETAL: 1+ pretibial bilateral edema; No deformity  SKIN: Warm and dry NEUROLOGIC:  Alert and oriented x 3 PSYCHIATRIC:  Normal affect    Signed, Shirlee More, MD  05/24/2017 10:36 AM    [place]

## 2017-05-25 ENCOUNTER — Other Ambulatory Visit (HOSPITAL_COMMUNITY): Payer: Self-pay | Admitting: *Deleted

## 2017-05-26 ENCOUNTER — Ambulatory Visit (HOSPITAL_BASED_OUTPATIENT_CLINIC_OR_DEPARTMENT_OTHER)
Admission: RE | Admit: 2017-05-26 | Discharge: 2017-05-26 | Disposition: A | Discharge status: Home / Self Care | Payer: Medicare Other | Source: Ambulatory Visit | Attending: Cardiology | Admitting: Cardiology

## 2017-05-26 DIAGNOSIS — R0602 Shortness of breath: Secondary | ICD-10-CM | POA: Diagnosis not present

## 2017-05-26 DIAGNOSIS — M7989 Other specified soft tissue disorders: Secondary | ICD-10-CM | POA: Diagnosis not present

## 2017-05-26 NOTE — Progress Notes (Signed)
Tech performed Upper extremity Venous duplex, no DVT seen

## 2017-05-26 NOTE — Progress Notes (Deleted)
Echocardiogram 2D Echocardiogram has been performed.  Joelene Millin 05/26/2017, 10:21 AM

## 2017-06-02 ENCOUNTER — Ambulatory Visit (INDEPENDENT_AMBULATORY_CARE_PROVIDER_SITE_OTHER): Payer: Medicare Other | Admitting: Family Medicine

## 2017-06-02 ENCOUNTER — Telehealth: Payer: Self-pay | Admitting: Family Medicine

## 2017-06-02 ENCOUNTER — Other Ambulatory Visit: Payer: Self-pay | Admitting: Family Medicine

## 2017-06-02 ENCOUNTER — Encounter: Payer: Self-pay | Admitting: Family Medicine

## 2017-06-02 VITALS — BP 100/58 | HR 91 | Temp 98.6°F | Ht 60.0 in | Wt 140.0 lb

## 2017-06-02 DIAGNOSIS — I509 Heart failure, unspecified: Secondary | ICD-10-CM

## 2017-06-02 DIAGNOSIS — R2231 Localized swelling, mass and lump, right upper limb: Secondary | ICD-10-CM

## 2017-06-02 LAB — BASIC METABOLIC PANEL
BUN: 14 mg/dL (ref 6–23)
CO2: 28 mEq/L (ref 19–32)
Calcium: 8.4 mg/dL (ref 8.4–10.5)
Chloride: 93 mEq/L — ABNORMAL LOW (ref 96–112)
Creatinine, Ser: 0.67 mg/dL (ref 0.40–1.20)
GFR: 88.73 mL/min (ref >60.00)
Glucose, Bld: 117 mg/dL — ABNORMAL HIGH (ref 70–99)
POTASSIUM: 3.5 meq/L (ref 3.5–5.1)
Sodium: 129 mEq/L — ABNORMAL LOW (ref 135–145)

## 2017-06-02 MED ORDER — POTASSIUM CHLORIDE ER 10 MEQ PO TBCR: EXTENDED_RELEASE_TABLET | ORAL | 5 refills | Status: DC

## 2017-06-02 NOTE — Patient Instructions (Signed)
Call Dr Antonieta Pert office for an appointment.   Ice/cold pack over area for 10-15 min every 2-3 hours while awake.  Wear the splint at night.   Wrist and Forearm Exercises Do exercises exactly as told by your health care provider and adjust them as directed. It is normal to feel mild stretching, pulling, tightness, or discomfort as you do these exercises, but you should stop right away if you feel sudden pain or your pain gets worse.   RANGE OF MOTION EXERCISES These exercises warm up your muscles and joints and improve the movement and flexibility of your injured wrist and forearm. These exercises also help to relieve pain, numbness, and tingling. These exercises are done using the muscles in your injured wrist and forearm. Exercise A: Wrist Flexion, Active 1. With your fingers relaxed, bend your wrist forward as far as you can. 2. Hold this position for 30 seconds. Repeat 2 times. Complete this exercise 3 times per week. Exercise B: Wrist Extension, Active 1. With your fingers relaxed, bend your wrist backward as far as you can. 2. Hold this position for 30 seconds. Repeat 2 times. Complete this exercise 3 times per week. Exercise C: Supination, Active  1. Stand or sit with your arms at your sides. 2. Bend your left / right elbow to an "L" shape (90 degrees). 3. Turn your palm upward until you feel a gentle stretch on the inside of your forearm. 4. Hold this position for 30 seconds. 5. Slowly return your palm to the starting position. Repeat 2 times. Complete this exercise 3 times per week. Exercise D: Pronation, Active  1. Stand or sit with your arms at your sides. 2. Bend your left / right elbow to an "L" shape (90 degrees). 3. Turn your palm downward until you feel a gentle stretch on the top of your forearm. 4. Hold this position for 30 seconds. 5. Slowly return your palm to the starting position. Repeat 2 times. Complete this exercise once a day.  STRETCHING  EXERCISES These exercises warm up your muscles and joints and improve the movement and flexibility of your injured wrist and forearm. These exercises also help to relieve pain, numbness, and tingling. These exercises are done using your healthy wrist and forearm to help stretch the muscles in your injured wrist and forearm. Exercise E: Wrist Flexion, Passive  1. Extend your left / right arm in front of you, relax your wrist, and point your fingers downward. 2. Gently push on the back of your hand. Stop when you feel a gentle stretch on the top of your forearm. 3. Hold this position for 30 seconds. Repeat 2 times. Complete this exercise 3 times per week. Exercise F: Wrist Extension, Passive  1. Extend your left / right arm in front of you and turn your palm upward. 2. Gently pull your palm and fingertips back so your fingers point downward. You should feel a gentle stretch on the palm-side of your forearm. 3. Hold this position for 30 seconds. Repeat 2 times. Complete this exercise 3 times per week. Exercise G: Forearm Rotation, Supination, Passive 1. Sit with your left / right elbow bent to an "L" shape (90 degrees) with your forearm resting on a table. 2. Keeping your upper body and shoulder still, use your other hand to rotate your forearm palm-up until you feel a gentle to moderate stretch. 3. Hold this position for 30 seconds. 4. Slowly release the stretch and return to the starting position. Repeat 2 times. Complete  this exercise 3 times per week. Exercise H: Forearm Rotation, Pronation, Passive 1. Sit with your left / right elbow bent to an "L" shape (90 degrees) with your forearm resting on a table. 2. Keeping your upper body and shoulder still, use your other hand to rotate your forearm palm-down until you feel a gentle to moderate stretch. 3. Hold this position for 30 seconds. 4. Slowly release the stretch and return to the starting position. Repeat 2 times. Complete this exercise 3  times per week.  STRENGTHENING EXERCISES These exercises build strength and endurance in your wrist and forearm. Endurance is the ability to use your muscles for a long time, even after they get tired. Exercise I: Wrist Flexors  1. Sit with your left / right forearm supported on a table and your hand resting palm-up over the edge of the table. Your elbow should be bent to an "L" shape (about 90 degrees) and be below the level of your shoulder. 2. Hold a 3-5 lb weight in your left / right hand. Or, hold a rubber exercise band or tube in both hands, keeping your hands at the same level and hip distance apart. There should be a slight tension in the exercise band or tube. 3. Slowly curl your hand up toward your forearm. 4. Hold this position for 3 seconds. 5. Slowly lower your hand back to the starting position. Repeat 2 times. Complete this exercise 3 times per week. Exercise J: Wrist Extensors  1. Sit with your left / right forearm supported on a table and your hand resting palm-down over the edge of the table. Your elbow should be bent to an "L" shape (about 90 degrees) and be below the level of your shoulder. 2. Hold a 3-5 lb weight in your left / right hand. Or, hold a rubber exercise band or tube in both hands, keeping your hands at the same level and hip distance apart. There should be a slight tension in the exercise band or tube. 3. Slowly curl your hand up toward your forearm. 4. Hold this position for 3 seconds. 5. Slowly lower your hand back to the starting position. Repeat 2 times. Complete this exercise 3 times per week. Exercise K: Forearm Rotation, Supination  1. Sit with your left / right forearm supported on a table and your hand resting palm-down. Your elbow should be at your side, bent to an "L" shape (about 90 degrees), and below the level of your shoulder. Keep your wrist stable and in a neutral position throughout the exercise. 2. Gently hold a lightweight hammer with your  left / right hand. 3. Without moving your elbow or wrist, slowly rotate your palm upward to a thumbs-up position. 4. Hold this position for 3 seconds. 5. Slowly return your forearm to the starting position. Repeat 2 times. Complete this exercise 3 times per week. Exercise L: Forearm Rotation, Pronation  1. Sit with your left / right forearm supported on a table and your hand resting palm-up. Your elbow should be at your side, bent to an "L" shape (about 90 degrees), and below the level of your shoulder. Keep your wrist stable. Do not allow it to move backward or forward during the exercise. 2. Gently hold a lightweight hammer with your left / right hand. 3. Without moving your elbow or wrist, slowly rotate your palm and hand upward to a thumbs-up position. 4. Hold this position for 3 seconds. 5. Slowly return your forearm to the starting position. Repeat 2  times. Complete this exercise 3 times per week. Exercise M: Grip Strengthening  1. Hold one of these items in your left / right hand: play dough, therapy putty, a dense sponge, a stress ball, or a large, rolled sock. 2. Squeeze as hard as you can without increasing pain. 3. Hold this position for 5 seconds. 4. Slowly release your grip. Repeat 2 times. Complete this exercise 3 times per week.  This information is not intended to replace advice given to you by your health care provider. Make sure you discuss any questions you have with your health care provider. Document Released: 04/29/2005 Document Revised: 03/09/2016 Document Reviewed: 03/10/2015 Elsevier Interactive Patient Education  [employer].

## 2017-06-02 NOTE — Progress Notes (Signed)
Pre visit review using our clinic review tool, if applicable. No additional management support is needed unless otherwise documented below in the visit note. 

## 2017-06-02 NOTE — Progress Notes (Signed)
K supp called in.

## 2017-06-02 NOTE — Telephone Encounter (Signed)
Results given Informed to pick up potassium supplement at pharmacy   Notes recorded by Shelda Pal, DO on 06/02/2017 at 4:31 PM EST I don't think it would be a bad idea to go on a supplement being on a diuretic and with her low normal K.   RE- let pt know her potassium is on the lower limit of normal so we will call in a potassium supplement. TY.

## 2017-06-02 NOTE — Progress Notes (Signed)
Musculoskeletal Exam  Patient: Stefanie Henry DOB: October 16, 1929  DOS: 06/02/2017  SUBJECTIVE:  Chief Complaint:   Chief Complaint  Patient presents with  . Wrist Pain    right  . Shortness of Breath    Stefanie Henry is a 83 y.o.  female for evaluation and treatment of R wrist pain and swelling.   Onset:  4 weeks ago. No injury or change in activity.  Location: distal forearm Character:  aching and sharp  Progression of issue:  is moderately worse Associated symptoms: swelling over distal forearm- has seen cardio for this, diuretic not helpful, UE duplex r/o'd clot Treatment: to date has been diuretic.   Neurovascular symptoms: no  She has been more fatigued lately.  Her son is wondering if her electrolytes are abnormal as she is on a diuretic.  He is wondering if I would call in a potassium supplement.  Her lecture lites had not been checked recently.  ROS: Musculoskeletal/Extremities: +R wrist pain Neurologic: no numbness, tingling no weakness   Past Medical History:  Diagnosis Date  . Abnormal LFTs (liver function tests)   . Abscess   . Abscess, abdomen 10/16/2014  . Anemia, iron deficiency 06/06/2012  . Arthritis   . Asthma   . Asthma, moderate persistent 08/27/2015   With chronic airway obstruction  . Back pain 06/09/2014  . Blood transfusion without reported diagnosis   . Cataract   . CHF (congestive heart failure) ([place]) 12/20/2015  . Chronic lymphatic leukemia ([place])   . Chronic lymphocytic leukemia ([place]) 06/06/2012  . Chronic respiratory failure ([place]) 12/02/2015  . CLL (chronic lymphocytic leukemia) ([place])   . Colovesical fistula   . Constipation   . COPD (chronic obstructive pulmonary disease) ([place])   . Dependent edema 12/18/2015  . Diverticulitis   . Diverticulitis of colon 12/04/2014  . Diverticulitis of large intestine with abscess without bleeding   . Diverticulitis of large intestine with perforation and abscess without bleeding 10/16/2014  . Dysphagia   .  Dyspnea 10/05/2014   Followed in Pulmonary clinic/ [facility]/ [place] - 10/05/2014   Walked RA x one lap @ 185 stopped due to fatigue >> sob/ no desats/ slow pace - Spirometry 10/05/2014 > min airflow obst    . Early satiety   . Fall   . Gallstones   . Gallstones without obstruction of gallbladder   . Gastric polyps   . GERD (gastroesophageal reflux disease)   . Hyponatremia 10/16/2014  . Labral tear of left hip joint 08/13/2016  . Left hip pain   . Lumbar compression fracture ([place]) 06/08/2014  . Malnutrition of moderate degree ([place]) 11/02/2014  . Midline low back pain without sciatica   . Neuromuscular disorder ([place])   . Shingles   . Tear of left acetabular labrum 08/12/2016  . Thrombocytopenia ([place]) 10/16/2014  . Unable to bear weight 08/12/2016  . UTI (urinary tract infection) 12/13/2015   Past Surgical History:  Procedure Laterality Date  . ESOPHAGOGASTRODUODENOSCOPY N/A 11/07/2014   Procedure: ESOPHAGOGASTRODUODENOSCOPY (EGD);  Surgeon: Gatha Mayer, MD;  Location: [facility];  Service: Endoscopy;  Laterality: N/A;  . none     Family History  Problem Relation Age of Onset  . Diabetes Father   . Stomach cancer Sister   . Gallbladder disease Mother   . Colon cancer Neg Hx   . Colon polyps Neg Hx   . Esophageal cancer Neg Hx    Current Outpatient Medications  Medication Sig Dispense Refill  . albuterol (PROVENTIL) (2.5 MG/3ML)  0.083% nebulizer solution Take 3 mLs (2.5 mg total) by nebulization 2 (two) times daily. 180 mL 3  . albuterol (VENTOLIN HFA) 108 (90 Base) MCG/ACT inhaler INHALE 1-2 PUFFS INTO THE LUNGS EVERY 6 HOURS AS NEEDED FOR WHEEZING OR SHORTNESS OF BREATH. 18 Inhaler 0  . Ascorbic Acid (VITAMIN C) 1000 MG tablet Take 1,000 mg by mouth daily.    . calcitonin, salmon, (MIACALCIN/FORTICAL) 200 UNIT/ACT nasal spray Place 1 spray into alternate nostrils daily. 3.7 mL 12  . DULERA 200-5 MCG/ACT AERO INHALE 2 PUFFS INTO THE LUNGS 2 (TWO) TIMES DAILY. 13 Inhaler 5  .  gabapentin (NEURONTIN) 100 MG capsule TAKE 2 CAPSULES BY MOUTH 3 TIMES A DAY AS NEEDED FOR NERVE PAIN 180 capsule 0  . GARLIC PO Take 1 tablet by mouth daily.    . hyoscyamine (LEVSIN SL) 0.125 MG SL tablet PLACE 1 TABLET (0.125 MG TOTAL) UNDER THE TONGUE EVERY 8 (EIGHT) HOURS AS NEEDED. (Patient taking differently: Take 0.125 mg by mouth every 8 (eight) hours as needed for cramping. Marland Kitchen) 60 tablet 2  . Multiple Vitamin (MULTIVITAMIN WITH MINERALS) TABS tablet Take 1 tablet by mouth daily.    Marland Kitchen omeprazole (PRILOSEC) 20 MG capsule TAKE ONE CAPSULE BY MOUTH EVERY DAY 90 capsule 1  . polyethylene glycol powder (MIRALAX) powder Take 17 g by mouth daily. 255 g 0  . predniSONE (DELTASONE) 10 MG tablet TAKE 1 TABLET BY MOUTH EVERY DAY 30 tablet 0  . psyllium (METAMUCIL) 58.6 % packet Take 1 packet by mouth daily. 30 each 0  . senna (SENOKOT) 8.6 MG TABS tablet Take 1-2 tablets by mouth daily as needed for mild constipation.    . Simethicone (GAS-X PO) Take 2 capsules by mouth daily as needed (for gas).     . sucralfate (CARAFATE) 1 g tablet TAKE 2 TABLETS IN THE MORNING AND AT BEDTIME 120 tablet 0  . torsemide (DEMADEX) 20 MG tablet Take 1 tablet (20 mg total) by mouth daily. 90 tablet 3  . vitamin B-12 (CYANOCOBALAMIN) 500 MCG tablet Take 500 mcg by mouth daily.     Allergies  Allergen Reactions  . Citrus Other (See Comments)    Stomach discomfort  . Other     Cannot take any antibiotics, causes rash  . Augmentin [Amoxicillin-Pot Clavulanate] Swelling and Rash    Has patient had a PCN reaction causing immediate rash, facial/tongue/throat swelling, SOB or lightheadedness with hypotension: Unknown Has patient had a PCN reaction causing severe rash involving mucus membranes or skin necrosis: Unknown Has patient had a PCN reaction that required hospitalization: Unknown Has patient had a PCN reaction occurring within the last 10 years: Unknown If all of the above answers are "NO", then may proceed with  Cephalosporin use.   . Ibuprofen Other (See Comments)    Stomach discomfort  . Keflex [Cephalexin] Swelling and Rash   Social History   Socioeconomic History  . Marital status: Married  Occupational History  . Occupation: Retired  Tobacco Use  . Smoking status: Never Smoker  . Smokeless tobacco: Never Used  Substance and Sexual Activity  . Alcohol use: No    Alcohol/week: 0.0 oz  . Drug use: No   Objective: VITAL SIGNS: BP (!) 100/58 (BP Location: Left Arm, Patient Position: Sitting, Cuff Size: Normal)   Pulse 91   Temp 98.6 F (37 C) (Oral)   Ht 5' (1.524 m)   Wt 140 lb (63.5 kg)   SpO2 98%   BMI 27.34 kg/m  Constitutional: Well formed, well developed. No acute distress. Cardiovascular: Brisk cap refill, Radial pulses 3+ b/l Thorax & Lungs: No accessory muscle use Extremities: No clubbing. No cyanosis. No edema.  Skin: Warm. Dry. No erythema. No rash.  Musculoskeletal: R wrist .   Normal active range of motion: yes.   Normal passive range of motion: yes Tenderness to palpation: yes, over volar surface of R hand Deformity: no Ecchymosis: no Edema noted at prox wrist Neurologic: Normal sensory function. No focal deficits noted. DTR's equal and symmetry in UE's. No clonus. Psychiatric: Normal mood.   Assessment:  Localized swelling, mass, or lump of right upper extremity  Chronic congestive heart failure, unspecified heart failure type ([place]) - Plan: Basic metabolic panel  Plan: Elevation, ice, splint, stretches/exercises, massage. Check lytes and will consider calling in K if deficient. F/u in a couple days to discuss breathing. The patient and son voiced understanding and agreement to the plan.   [place], DO 06/02/17  12:14 PM

## 2017-06-03 DIAGNOSIS — M7989 Other specified soft tissue disorders: Secondary | ICD-10-CM | POA: Diagnosis not present

## 2017-06-03 DIAGNOSIS — J449 Chronic obstructive pulmonary disease, unspecified: Secondary | ICD-10-CM | POA: Diagnosis not present

## 2017-06-03 DIAGNOSIS — J9611 Chronic respiratory failure with hypoxia: Secondary | ICD-10-CM | POA: Diagnosis not present

## 2017-06-03 DIAGNOSIS — M1990 Unspecified osteoarthritis, unspecified site: Secondary | ICD-10-CM | POA: Diagnosis not present

## 2017-06-07 ENCOUNTER — Other Ambulatory Visit: Payer: Self-pay | Admitting: Family

## 2017-06-09 ENCOUNTER — Telehealth: Payer: Self-pay | Admitting: Family Medicine

## 2017-06-09 NOTE — Telephone Encounter (Signed)
Copied from [place]. Topic: Quick Communication - See Telephone Encounter >> Jun 09, 2017 11:00 AM Antonieta Iba C wrote:    CRM for notification. See Telephone encounter for: Gerald Stabs PTw/ [facility] [phone] called in to make provider aware that they have not been able to see pt due to pt /family not returning call to set up visits.   06/09/17.

## 2017-06-09 NOTE — Telephone Encounter (Signed)
FYI

## 2017-06-23 ENCOUNTER — Ambulatory Visit (INDEPENDENT_AMBULATORY_CARE_PROVIDER_SITE_OTHER): Payer: Medicare Other | Admitting: Family Medicine

## 2017-06-23 ENCOUNTER — Encounter: Payer: Self-pay | Admitting: Family Medicine

## 2017-06-23 ENCOUNTER — Ambulatory Visit (HOSPITAL_BASED_OUTPATIENT_CLINIC_OR_DEPARTMENT_OTHER)
Admission: RE | Admit: 2017-06-23 | Discharge: 2017-06-23 | Disposition: A | Discharge status: Home / Self Care | Payer: Medicare Other | Source: Ambulatory Visit | Attending: Family Medicine | Admitting: Family Medicine

## 2017-06-23 VITALS — BP 100/60 | HR 103 | Temp 98.9°F | Ht 60.0 in | Wt 137.0 lb

## 2017-06-23 DIAGNOSIS — M19031 Primary osteoarthritis, right wrist: Secondary | ICD-10-CM | POA: Insufficient documentation

## 2017-06-23 DIAGNOSIS — M25531 Pain in right wrist: Secondary | ICD-10-CM | POA: Diagnosis not present

## 2017-06-23 MED ORDER — TRAMADOL HCL 50 MG PO TABS: 50.0000 mg | ORAL_TABLET | Freq: Three times a day (TID) | ORAL | 0 refills | Status: DC | PRN

## 2017-06-23 NOTE — Progress Notes (Signed)
Musculoskeletal Exam  Patient: Stefanie Henry DOB: December 06, 1929  DOS: 06/23/2017  SUBJECTIVE:  Chief Complaint:   Chief Complaint  Patient presents with  . Wrist Pain    right     Stefanie Henry is a 83 y.o.  female for evaluation and treatment of R wrist pain.   Onset:  6 weeks ago.  No injury or change in activity.  Location: Dorsal right wrist Character:  aching  Progression of issue:  is moderately worse Associated symptoms: Swelling that is also worsening Treatment: to date has been rest, ice, elevation, bracing/splinting, stretches/exercises.   Neurovascular symptoms: no  ROS: Musculoskeletal/Extremities: +R wrist pain Neurologic: no numbness, tingling no weakness   Past Medical History:  Diagnosis Date  . Abnormal LFTs (liver function tests)   . Abscess   . Abscess, abdomen 10/16/2014  . Anemia, iron deficiency 06/06/2012  . Arthritis   . Asthma   . Asthma, moderate persistent 08/27/2015   With chronic airway obstruction  . Back pain 06/09/2014  . Blood transfusion without reported diagnosis   . Cataract   . CHF (congestive heart failure) ([place]) 12/20/2015  . Chronic lymphatic leukemia ([place])   . Chronic lymphocytic leukemia ([place]) 06/06/2012  . Chronic respiratory failure ([place]) 12/02/2015  . CLL (chronic lymphocytic leukemia) ([place])   . Colovesical fistula   . Constipation   . COPD (chronic obstructive pulmonary disease) ([place])   . Dependent edema 12/18/2015  . Diverticulitis   . Diverticulitis of colon 12/04/2014  . Diverticulitis of large intestine with abscess without bleeding   . Diverticulitis of large intestine with perforation and abscess without bleeding 10/16/2014  . Dysphagia   . Dyspnea 10/05/2014   Followed in Pulmonary clinic/ [facility]/ [place] - 10/05/2014   Walked RA x one lap @ 185 stopped due to fatigue >> sob/ no desats/ slow pace - Spirometry 10/05/2014 > min airflow obst    . Early satiety   . Fall   . Gallstones   . Gallstones without obstruction  of gallbladder   . Gastric polyps   . GERD (gastroesophageal reflux disease)   . Hyponatremia 10/16/2014  . Labral tear of left hip joint 08/13/2016  . Left hip pain   . Lumbar compression fracture ([place]) 06/08/2014  . Malnutrition of moderate degree ([place]) 11/02/2014  . Midline low back pain without sciatica   . Neuromuscular disorder ([place])   . Shingles   . Tear of left acetabular labrum 08/12/2016  . Thrombocytopenia ([place]) 10/16/2014  . Unable to bear weight 08/12/2016  . UTI (urinary tract infection) 12/13/2015    Objective: VITAL SIGNS: BP 100/60 (BP Location: Left Arm, Patient Position: Sitting, Cuff Size: Normal)   Pulse (!) 103   Temp 98.9 F (37.2 C) (Oral)   Ht 5' (1.524 m)   Wt 137 lb (62.1 kg)   SpO2 93%   BMI 26.76 kg/m  Constitutional: Well formed, well developed. No acute distress. Cardiovascular: Brisk cap refill, radial pulse 1+ Thorax & Lungs: No accessory muscle use Extremities: No clubbing. No cyanosis. No edema.  Skin: Warm. Dry. No erythema. No rash.  Musculoskeletal: R wrist.   Normal active range of motion: no, decreased flexion.   Tenderness to palpation: yes, over carpals dorsally Deformity: no Ecchymosis: no Tests positive: none Tests negative: Finkelstein's Neurologic: Normal sensory function. No focal deficits noted. No clonus. Psychiatric: Normal mood. Age appropriate judgment and insight. Alert & oriented x 3.    Assessment:  Right wrist pain - Plan: Ambulatory referral to Sports  Medicine, traMADol (ULTRAM) 50 MG tablet, DG Wrist Complete Right  Plan: Orders as above. Refer to sports med, check X-ray that shows arthritic changes. Trial tramadol for pain. ACE wrap for compression. Ice. Elevation.  F/u prn at this point.  The patient and her son voiced understanding and agreement to the plan.   [place], DO 06/23/17  5:01 PM

## 2017-06-23 NOTE — Progress Notes (Signed)
Pre visit review using our clinic review tool, if applicable. No additional management support is needed unless otherwise documented below in the visit note. 

## 2017-06-23 NOTE — Patient Instructions (Addendum)
Continue wearing the brace.   Continue ice.   Try wrap to help with swelling.  Continue elevation.  Miralax 1-2 times daily vs Mag citrate.   We will send you a message regarding your X-ray results.

## 2017-06-28 ENCOUNTER — Telehealth: Payer: Self-pay | Admitting: Family Medicine

## 2017-06-28 NOTE — Telephone Encounter (Signed)
Copied from [place] [phone]. Topic: Quick Communication - See Telephone Encounter >> Jun 28, 2017 10:30 AM Oneta Rack wrote: CRM for notification. See Telephone encounter for:   06/28/17.  Caller name: Melis Trochez  Relation to pt: son  Call back number: [phone]    Reason for call:  Son inquiring about imaging results taken 06/23/17, please advise

## 2017-06-28 NOTE — Telephone Encounter (Signed)
Patients son informed. Swelling has increased into the upper arm Anything for swelling or pain other than tramadol --- very sedative for her.

## 2017-06-28 NOTE — Telephone Encounter (Signed)
XR shows some arthritic changes. Spoke with sports med doc and this is something they can help her with. Thanks.

## 2017-06-30 MED ORDER — MELOXICAM 7.5 MG PO TABS: 7.5000 mg | ORAL_TABLET | Freq: Every day | ORAL | 0 refills | Status: AC

## 2017-06-30 NOTE — Addendum Note (Signed)
Addended by: Ames Coupe on: 06/30/2017 11:00 AM   Modules accepted: Orders

## 2017-06-30 NOTE — Telephone Encounter (Signed)
A low dose of Mobic called in. Please let pt's son know. TY.

## 2017-06-30 NOTE — Telephone Encounter (Signed)
Son informed.

## 2017-07-01 ENCOUNTER — Ambulatory Visit: Payer: Medicare Other | Admitting: Family Medicine

## 2017-07-01 ENCOUNTER — Telehealth: Payer: Self-pay | Admitting: Family Medicine

## 2017-07-01 ENCOUNTER — Other Ambulatory Visit: Payer: Self-pay | Admitting: Family Medicine

## 2017-07-01 DIAGNOSIS — M25539 Pain in unspecified wrist: Secondary | ICD-10-CM

## 2017-07-01 NOTE — Telephone Encounter (Signed)
Referral done

## 2017-07-01 NOTE — Telephone Encounter (Signed)
Copied from [place] [phone]. Topic: Quick Communication - See Telephone Encounter >> Jul 01, 2017 12:17 PM Cleaster Corin, NT wrote: CRM for notification. See Telephone encounter for:   07/01/17. Pt.son Calling to see if there can be another referral sent to a different sports med. Doctor. Son can be contacting at  [phone]

## 2017-07-01 NOTE — Telephone Encounter (Signed)
Please place referral. TY.  

## 2017-07-06 ENCOUNTER — Other Ambulatory Visit: Payer: Self-pay | Admitting: Family

## 2017-07-06 ENCOUNTER — Ambulatory Visit (INDEPENDENT_AMBULATORY_CARE_PROVIDER_SITE_OTHER): Payer: Medicare Other | Admitting: Family Medicine

## 2017-07-06 ENCOUNTER — Encounter: Payer: Self-pay | Admitting: Family Medicine

## 2017-07-06 VITALS — BP 108/68 | HR 110 | Ht 60.0 in | Wt 130.0 lb

## 2017-07-06 DIAGNOSIS — M79601 Pain in right arm: Secondary | ICD-10-CM | POA: Diagnosis not present

## 2017-07-06 DIAGNOSIS — M25431 Effusion, right wrist: Secondary | ICD-10-CM | POA: Diagnosis not present

## 2017-07-06 DIAGNOSIS — M25531 Pain in right wrist: Secondary | ICD-10-CM | POA: Diagnosis present

## 2017-07-06 NOTE — Patient Instructions (Signed)
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 We will go ahead with an MRI with and without contrast to further assess. I would recommend following up with me the business day following this for a no charge visit to go over results and next steps - this will likely need to be removed even if it is a benign mass however given the amount of pain it's causing and that the flexor tendon of your index finger is trapped in here. Take tylenol 500mg  1-2 tabs three times a day. Take tramadol as needed in addition to this.

## 2017-07-07 DIAGNOSIS — R2231 Localized swelling, mass and lump, right upper limb: Secondary | ICD-10-CM | POA: Diagnosis not present

## 2017-07-08 ENCOUNTER — Encounter: Payer: Self-pay | Admitting: Family Medicine

## 2017-07-08 ENCOUNTER — Ambulatory Visit (INDEPENDENT_AMBULATORY_CARE_PROVIDER_SITE_OTHER): Payer: Medicare Other | Admitting: Family Medicine

## 2017-07-08 VITALS — BP 105/66 | HR 105 | Ht 60.0 in | Wt 130.0 lb

## 2017-07-08 DIAGNOSIS — M79601 Pain in right arm: Secondary | ICD-10-CM

## 2017-07-08 DIAGNOSIS — M1990 Unspecified osteoarthritis, unspecified site: Secondary | ICD-10-CM | POA: Insufficient documentation

## 2017-07-08 NOTE — Assessment & Plan Note (Signed)
2/2 large soft tissue mass, concerning for a sarcoma.  We will go ahead with MRI with and without contrast to further characterize.  Note she has a history of chronic lymphocytic leukemia and did not tolerate treatment for this.  Tylenol, tramadol in meantime as needed.

## 2017-07-08 NOTE — Progress Notes (Signed)
PCP: Shelda Pal, DO  Subjective:   HPI: Patient is a 84 y.o. female here for right wrist pain.  Patient and son here for visit. She was seen with interpreter present. They report that over about 6 weeks she has had increased swelling and pain of her wrist and distal forearm. Pain level up to 9/10 and sharp. Has tried lasix and torsemide which helped some with swelling but this persists. Reports having an ultrasound of the arm that was negative for a blood clot but I do not see this in the system. She has history of chronic lymphocytic leukemia - did not tolerate treatment for this about 4 years ago. No skin changes, numbness. Causing flexion of index finger and unable to straighten this out now.  Past Medical History:  Diagnosis Date  . Abnormal LFTs (liver function tests)   . Abscess   . Abscess, abdomen 10/16/2014  . Anemia, iron deficiency 06/06/2012  . Arthritis   . Asthma   . Asthma, moderate persistent 08/27/2015   With chronic airway obstruction  . Back pain 06/09/2014  . Blood transfusion without reported diagnosis   . Cataract   . CHF (congestive heart failure) ([place]) 12/20/2015  . Chronic lymphatic leukemia ([place])   . Chronic lymphocytic leukemia ([place]) 06/06/2012  . Chronic respiratory failure ([place]) 12/02/2015  . CLL (chronic lymphocytic leukemia) ([place])   . Colovesical fistula   . Constipation   . COPD (chronic obstructive pulmonary disease) ([place])   . Dependent edema 12/18/2015  . Diverticulitis   . Diverticulitis of colon 12/04/2014  . Diverticulitis of large intestine with abscess without bleeding   . Diverticulitis of large intestine with perforation and abscess without bleeding 10/16/2014  . Dysphagia   . Dyspnea 10/05/2014   Followed in Pulmonary clinic/ [facility]/ [place] - 10/05/2014   Walked RA x one lap @ 185 stopped due to fatigue >> sob/ no desats/ slow pace - Spirometry 10/05/2014 > min airflow obst    . Early satiety   . Fall   . Gallstones   .  Gallstones without obstruction of gallbladder   . Gastric polyps   . GERD (gastroesophageal reflux disease)   . Hyponatremia 10/16/2014  . Labral tear of left hip joint 08/13/2016  . Left hip pain   . Lumbar compression fracture ([place]) 06/08/2014  . Malnutrition of moderate degree ([place]) 11/02/2014  . Midline low back pain without sciatica   . Neuromuscular disorder ([place])   . Shingles   . Tear of left acetabular labrum 08/12/2016  . Thrombocytopenia ([place]) 10/16/2014  . Unable to bear weight 08/12/2016  . UTI (urinary tract infection) 12/13/2015    Current Outpatient Medications on File Prior to Visit  Medication Sig Dispense Refill  . albuterol (PROVENTIL) (2.5 MG/3ML) 0.083% nebulizer solution Take 3 mLs (2.5 mg total) by nebulization 2 (two) times daily. 180 mL 3  . albuterol (VENTOLIN HFA) 108 (90 Base) MCG/ACT inhaler INHALE 1-2 PUFFS INTO THE LUNGS EVERY 6 HOURS AS NEEDED FOR WHEEZING OR SHORTNESS OF BREATH. 18 Inhaler 0  . Ascorbic Acid (VITAMIN C) 1000 MG tablet Take 1,000 mg by mouth daily.    . calcitonin, salmon, (MIACALCIN/FORTICAL) 200 UNIT/ACT nasal spray Place 1 spray into alternate nostrils daily. 3.7 mL 12  . DULERA 200-5 MCG/ACT AERO INHALE 2 PUFFS INTO THE LUNGS 2 (TWO) TIMES DAILY. 13 Inhaler 5  . gabapentin (NEURONTIN) 100 MG capsule TAKE 2 CAPSULES BY MOUTH 3 TIMES A DAY AS NEEDED FOR NERVE PAIN 180  capsule 0  . GARLIC PO Take 1 tablet by mouth daily.    . hyoscyamine (LEVSIN SL) 0.125 MG SL tablet PLACE 1 TABLET (0.125 MG TOTAL) UNDER THE TONGUE EVERY 8 (EIGHT) HOURS AS NEEDED. (Patient taking differently: Take 0.125 mg by mouth every 8 (eight) hours as needed for cramping. Marland Kitchen) 60 tablet 2  . meloxicam (MOBIC) 7.5 MG tablet Take 1 tablet (7.5 mg total) by mouth daily. 30 tablet 0  . Multiple Vitamin (MULTIVITAMIN WITH MINERALS) TABS tablet Take 1 tablet by mouth daily.    Marland Kitchen omeprazole (PRILOSEC) 20 MG capsule TAKE ONE CAPSULE BY MOUTH EVERY DAY 90 capsule 1  . polyethylene  glycol powder (MIRALAX) powder Take 17 g by mouth daily. 255 g 0  . potassium chloride (KLOR-CON 10) 10 MEQ tablet Take 1 tab daily with torsemide. 30 tablet 5  . psyllium (METAMUCIL) 58.6 % packet Take 1 packet by mouth daily. 30 each 0  . senna (SENOKOT) 8.6 MG TABS tablet Take 1-2 tablets by mouth daily as needed for mild constipation.    . Simethicone (GAS-X PO) Take 2 capsules by mouth daily as needed (for gas).     . sucralfate (CARAFATE) 1 g tablet TAKE 2 TABLETS IN THE MORNING AND AT BEDTIME 120 tablet 0  . torsemide (DEMADEX) 20 MG tablet Take 1 tablet (20 mg total) by mouth daily. 90 tablet 3  . traMADol (ULTRAM) 50 MG tablet Take 1 tablet (50 mg total) by mouth every 8 (eight) hours as needed (Pain). 15 tablet 0  . vitamin B-12 (CYANOCOBALAMIN) 500 MCG tablet Take 500 mcg by mouth daily.     No current facility-administered medications on file prior to visit.     Past Surgical History:  Procedure Laterality Date  . ESOPHAGOGASTRODUODENOSCOPY N/A 11/07/2014   Procedure: ESOPHAGOGASTRODUODENOSCOPY (EGD);  Surgeon: Gatha Mayer, MD;  Location: [facility];  Service: Endoscopy;  Laterality: N/A;  . none      Allergies  Allergen Reactions  . Citrus Other (See Comments)    Stomach discomfort  . Other     Cannot take any antibiotics, causes rash  . Augmentin [Amoxicillin-Pot Clavulanate] Swelling and Rash    Has patient had a PCN reaction causing immediate rash, facial/tongue/throat swelling, SOB or lightheadedness with hypotension: Unknown Has patient had a PCN reaction causing severe rash involving mucus membranes or skin necrosis: Unknown Has patient had a PCN reaction that required hospitalization: Unknown Has patient had a PCN reaction occurring within the last 10 years: Unknown If all of the above answers are "NO", then may proceed with Cephalosporin use.   . Ibuprofen Other (See Comments)    Stomach discomfort  . Keflex [Cephalexin] Swelling and Rash    Social  History   Socioeconomic History  . Marital status: Married    Spouse name: Not on file  . Number of children: 2  . Years of education: Not on file  . Highest education level: Not on file  Social Needs  . Financial resource strain: Not on file  . Food insecurity - worry: Not on file  . Food insecurity - inability: Not on file  . Transportation needs - medical: Not on file  . Transportation needs - non-medical: Not on file  Occupational History  . Occupation: Retired  Tobacco Use  . Smoking status: Never Smoker  . Smokeless tobacco: Never Used  Substance and Sexual Activity  . Alcohol use: No    Alcohol/week: 0.0 oz  . Drug use: No  .  Sexual activity: Not on file  Other Topics Concern  . Not on file  Social History Narrative  . Not on file    Family History  Problem Relation Age of Onset  . Diabetes Father   . Stomach cancer Sister   . Gallbladder disease Mother   . Colon cancer Neg Hx   . Colon polyps Neg Hx   . Esophageal cancer Neg Hx     BP 108/68   Pulse (!) 110   Ht 5' (1.524 m)   Wt 130 lb (59 kg)   BMI 25.39 kg/m   Review of Systems: See HPI above.     Objective:  Physical Exam:  Gen: NAD, comfortable in exam room  Right wrist/forearm: Large mass on volar aspect, nonmobile.  No bruising, erythema.  No pitting edema. PIP and DIP joints of 2nd digit in flexion. TTP greatest over mass but mild tenderness also of dorsal hand. Limited flexion and extension of wrist.  FROM all digits except 2nd digit.  Strength 5/5 finger abduction, wrist flexion and extension. NVI distally.  Left wrist: No deformity. No tenderness. FROM with 5/5 strength. NVI distally.   MSK u/s:  Large complex soft tissue mass noted distal forearm - anechoic areas, one hyperechoic region - ? Calcification within mass, neovascularity in portions.  Noted that flexor tendon of index finger is trapped within center of this mass but appears intact and moves with passive extension of 2nd  digit.  Assessment & Plan:  1. Right arm pain - 2/2 large soft tissue mass, concerning for a sarcoma.  We will go ahead with MRI with and without contrast to further characterize.  Note she has a history of chronic lymphocytic leukemia and did not tolerate treatment for this.  Tylenol, tramadol in meantime as needed.

## 2017-07-10 ENCOUNTER — Other Ambulatory Visit (HOSPITAL_BASED_OUTPATIENT_CLINIC_OR_DEPARTMENT_OTHER): Payer: Medicare Other

## 2017-07-10 ENCOUNTER — Encounter: Payer: Self-pay | Admitting: Family Medicine

## 2017-07-10 NOTE — Progress Notes (Signed)
Patient brought back today with son and used interpreter line to discuss MRI results.  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  This could not confirm status of flexor tendon but on ultrasound this tendon is intact.  She does have history of CLL but did not tolerate treatment for this.  She has quite a bit of pain and loss of function related to this mass.  Question if she would be a surgical candidate and/or if she'd be a candidate for other treatment measures to help with pain.  We are referring her to Dr. Mylo Red at [facility] to discuss - she has appointment Monday afternoon.  Her son made appointment with Dr. [name] as well who she's seen for CLL in the past.

## 2017-07-12 ENCOUNTER — Inpatient Hospital Stay (HOSPITAL_BASED_OUTPATIENT_CLINIC_OR_DEPARTMENT_OTHER): Payer: Medicare Other | Admitting: Hematology & Oncology

## 2017-07-12 ENCOUNTER — Other Ambulatory Visit: Payer: Self-pay

## 2017-07-12 ENCOUNTER — Telehealth: Payer: Self-pay | Admitting: *Deleted

## 2017-07-12 ENCOUNTER — Inpatient Hospital Stay: Payer: Medicare Other | Attending: Hematology & Oncology

## 2017-07-12 DIAGNOSIS — C9110 Chronic lymphocytic leukemia of B-cell type not having achieved remission: Secondary | ICD-10-CM | POA: Diagnosis not present

## 2017-07-12 DIAGNOSIS — R110 Nausea: Secondary | ICD-10-CM | POA: Insufficient documentation

## 2017-07-12 DIAGNOSIS — R0602 Shortness of breath: Secondary | ICD-10-CM | POA: Diagnosis not present

## 2017-07-12 DIAGNOSIS — R5383 Other fatigue: Secondary | ICD-10-CM | POA: Diagnosis not present

## 2017-07-12 DIAGNOSIS — J029 Acute pharyngitis, unspecified: Secondary | ICD-10-CM | POA: Insufficient documentation

## 2017-07-12 DIAGNOSIS — Z7952 Long term (current) use of systemic steroids: Secondary | ICD-10-CM

## 2017-07-12 DIAGNOSIS — R5381 Other malaise: Secondary | ICD-10-CM

## 2017-07-12 DIAGNOSIS — R630 Anorexia: Secondary | ICD-10-CM

## 2017-07-12 DIAGNOSIS — M799 Soft tissue disorder, unspecified: Secondary | ICD-10-CM | POA: Diagnosis not present

## 2017-07-12 DIAGNOSIS — R531 Weakness: Secondary | ICD-10-CM | POA: Insufficient documentation

## 2017-07-12 DIAGNOSIS — M25531 Pain in right wrist: Secondary | ICD-10-CM

## 2017-07-12 DIAGNOSIS — M7989 Other specified soft tissue disorders: Secondary | ICD-10-CM

## 2017-07-12 DIAGNOSIS — R3915 Urgency of urination: Secondary | ICD-10-CM | POA: Insufficient documentation

## 2017-07-12 DIAGNOSIS — R161 Splenomegaly, not elsewhere classified: Secondary | ICD-10-CM | POA: Insufficient documentation

## 2017-07-12 DIAGNOSIS — D508 Other iron deficiency anemias: Secondary | ICD-10-CM

## 2017-07-12 DIAGNOSIS — D509 Iron deficiency anemia, unspecified: Secondary | ICD-10-CM | POA: Insufficient documentation

## 2017-07-12 DIAGNOSIS — Z880 Allergy status to penicillin: Secondary | ICD-10-CM | POA: Diagnosis not present

## 2017-07-12 DIAGNOSIS — C8514 Unspecified B-cell lymphoma, lymph nodes of axilla and upper limb: Secondary | ICD-10-CM | POA: Diagnosis not present

## 2017-07-12 DIAGNOSIS — Z79899 Other long term (current) drug therapy: Secondary | ICD-10-CM

## 2017-07-12 LAB — IRON AND TIBC
IRON: 26 ug/dL — AB (ref 41–142)
Saturation Ratios: 11 % — ABNORMAL LOW (ref 21–57)
TIBC: 243 ug/dL (ref 236–444)
UIBC: 217 ug/dL

## 2017-07-12 LAB — CBC WITH DIFFERENTIAL (CANCER CENTER ONLY)
BASOS PCT: 0 %
Basophils Absolute: 0 10*3/uL (ref 0.0–0.1)
EOS ABS: 0 10*3/uL (ref 0.0–0.5)
EOS PCT: 0 %
HCT: 32.5 % — ABNORMAL LOW (ref 34.8–46.6)
HEMOGLOBIN: 10 g/dL — AB (ref 11.6–15.9)
Lymphocytes Relative: 90 %
Lymphs Abs: 48.4 10*3/uL — ABNORMAL HIGH (ref 0.9–3.3)
MCH: 22.2 pg — AB (ref 26.0–34.0)
MCHC: 30.8 g/dL — AB (ref 32.0–36.0)
MCV: 72.2 fL — ABNORMAL LOW (ref 81.0–101.0)
MONO ABS: 0.5 10*3/uL (ref 0.1–0.9)
Monocytes Relative: 1 %
NEUTROS ABS: 4.8 10*3/uL (ref 1.5–6.5)
NEUTROS PCT: 9 %
Platelet Count: 134 10*3/uL — ABNORMAL LOW (ref 145–400)
RBC: 4.5 MIL/uL (ref 3.70–5.32)
RDW: 13.5 % (ref 11.1–15.7)
WBC Count: 53.7 10*3/uL (ref 3.9–10.3)

## 2017-07-12 LAB — CMP (CANCER CENTER ONLY)
ALBUMIN: 3.1 g/dL — AB (ref 3.5–5.0)
ALT: 22 U/L (ref 0–55)
AST: 18 U/L (ref 5–34)
Alkaline Phosphatase: 88 U/L — ABNORMAL HIGH (ref 26–84)
Anion gap: 14 (ref 5–15)
BILIRUBIN TOTAL: 0.6 mg/dL (ref 0.2–1.2)
BUN: 12 mg/dL (ref 7–22)
CALCIUM: 8.4 mg/dL (ref 8.0–10.3)
CHLORIDE: 101 mmol/L (ref 98–108)
CO2: 27 mmol/L (ref 18–33)
CREATININE: 0.8 mg/dL (ref 0.60–1.10)
GLUCOSE: 122 mg/dL — AB (ref 73–118)
Potassium: 3.8 mmol/L (ref 3.5–5.1)
SODIUM: 142 mmol/L (ref 128–145)
Total Protein: 5.5 g/dL — ABNORMAL LOW (ref 6.4–8.1)

## 2017-07-12 LAB — FERRITIN: FERRITIN: 301 ng/mL — AB (ref 9–269)

## 2017-07-12 LAB — LACTATE DEHYDROGENASE: LDH: 145 U/L (ref 125–245)

## 2017-07-12 MED ORDER — TRAMADOL HCL 50 MG PO TABS: 50.0000 mg | ORAL_TABLET | Freq: Three times a day (TID) | ORAL | 0 refills | Status: DC | PRN

## 2017-07-12 NOTE — Progress Notes (Signed)
Hematology and Oncology Follow Up Visit  Deanza Upperman [id number] 09/23/1929 84 y.o. 07/12/2017   Principle Diagnosis:  Chronic lymphocytic leukemia - observation only, did not tolerate treatment and discontinued Iron deficiency anemia Mass in the right forearm  Current Therapy:   IV iron as indicated - last received 2 doses in January 2016     Interim History:  Ms. Bujak is here today with her son for an urgent visit.  I am not seeing her for 8 months.  Typically, she has done pretty well.  The problem is that she had swelling in the right arm.  This happened about a month or so ago.  There is some pain in the forearm.  According to her son, the arm began to swell quickly.  She saw Dr. Barbaraann Barthel.  He went ahead and ordered an MRI.  I think she initially had a plain film which appear to be suspicious.  Shockingly, the MRI showed a large complex mass centered in the volar compartment of the forearm.  This mass measures 12 x 4 x 5.4 cm.  There is an area of central necrosis.  The differential included sarcoma.  However, also felt possible would be a lymphomatous mass.  As far as her CLL is concerned, she is done incredibly well with this.  She had treatment for this several years ago.  She apparently tolerated this incredibly poorly.  As such, we have just watched her.  She has been hospitalized for non-CLL reasons.  She does have splenomegaly from her CLL.  Her last splenic ultrasound was back in May 2018.  She had minimal splenomegaly.  She has not noted any palpable lymph nodes.  She has been seen by Dr. Janice Coffin at [facility] today.  Dr. Mylo Red is an orthopedic oncologist.  She has had no fever.  There is been no bleeding..  She is on chronic steroids to try to help with her appetite.  Overall, her performance status is ECOG 2-3.      Medications:  Allergies as of 07/12/2017      Reactions   Citrus Other (See Comments)   Stomach discomfort   Other    Cannot take  any antibiotics, causes rash   Augmentin [amoxicillin-pot Clavulanate] Swelling, Rash   Has patient had a PCN reaction causing immediate rash, facial/tongue/throat swelling, SOB or lightheadedness with hypotension: Unknown Has patient had a PCN reaction causing severe rash involving mucus membranes or skin necrosis: Unknown Has patient had a PCN reaction that required hospitalization: Unknown Has patient had a PCN reaction occurring within the last 10 years: Unknown If all of the above answers are "NO", then may proceed with Cephalosporin use.   Ibuprofen Other (See Comments)   Stomach discomfort   Keflex [cephalexin] Swelling, Rash      Medication List        Accurate as of 07/12/17  5:56 PM. Always use your most recent med list.          albuterol (2.5 MG/3ML) 0.083% nebulizer solution Commonly known as:  PROVENTIL Take 3 mLs (2.5 mg total) by nebulization 2 (two) times daily.   albuterol 108 (90 Base) MCG/ACT inhaler Commonly known as:  VENTOLIN HFA INHALE 1-2 PUFFS INTO THE LUNGS EVERY 6 HOURS AS NEEDED FOR WHEEZING OR SHORTNESS OF BREATH.   calcitonin (salmon) 200 UNIT/ACT nasal spray Commonly known as:  MIACALCIN/FORTICAL Place 1 spray into alternate nostrils daily.   DULERA 200-5 MCG/ACT Aero Generic drug:  mometasone-formoterol INHALE 2 PUFFS INTO  THE LUNGS 2 (TWO) TIMES DAILY.   gabapentin 100 MG capsule Commonly known as:  NEURONTIN TAKE 2 CAPSULES BY MOUTH 3 TIMES A DAY AS NEEDED FOR NERVE PAIN   GARLIC PO Take 1 tablet by mouth daily.   GAS-X PO Take 2 capsules by mouth daily as needed (for gas).   hyoscyamine 0.125 MG SL tablet Commonly known as:  LEVSIN SL PLACE 1 TABLET (0.125 MG TOTAL) UNDER THE TONGUE EVERY 8 (EIGHT) HOURS AS NEEDED.   meloxicam 7.5 MG tablet Commonly known as:  MOBIC Take 1 tablet (7.5 mg total) by mouth daily.   multivitamin with minerals Tabs tablet Take 1 tablet by mouth daily.   omeprazole 20 MG capsule Commonly known as:   PRILOSEC TAKE ONE CAPSULE BY MOUTH EVERY DAY   polyethylene glycol powder powder Commonly known as:  MIRALAX Take 17 g by mouth daily.   potassium chloride 10 MEQ tablet Commonly known as:  KLOR-CON 10 Take 1 tab daily with torsemide.   predniSONE 10 MG tablet Commonly known as:  DELTASONE TAKE 1 TABLET BY MOUTH EVERY DAY   psyllium 58.6 % packet Commonly known as:  METAMUCIL Take 1 packet by mouth daily.   senna 8.6 MG Tabs tablet Commonly known as:  SENOKOT Take 1-2 tablets by mouth daily as needed for mild constipation.   sucralfate 1 g tablet Commonly known as:  CARAFATE TAKE 2 TABLETS IN THE MORNING AND AT BEDTIME   torsemide 20 MG tablet Commonly known as:  DEMADEX Take 1 tablet (20 mg total) by mouth daily.   traMADol 50 MG tablet Commonly known as:  ULTRAM Take 1 tablet (50 mg total) by mouth every 8 (eight) hours as needed (Pain).   vitamin B-12 500 MCG tablet Commonly known as:  CYANOCOBALAMIN Take 500 mcg by mouth daily.   vitamin C 1000 MG tablet Take 1,000 mg by mouth daily.       Allergies:  Allergies  Allergen Reactions  . Citrus Other (See Comments)    Stomach discomfort  . Other     Cannot take any antibiotics, causes rash  . Augmentin [Amoxicillin-Pot Clavulanate] Swelling and Rash    Has patient had a PCN reaction causing immediate rash, facial/tongue/throat swelling, SOB or lightheadedness with hypotension: Unknown Has patient had a PCN reaction causing severe rash involving mucus membranes or skin necrosis: Unknown Has patient had a PCN reaction that required hospitalization: Unknown Has patient had a PCN reaction occurring within the last 10 years: Unknown If all of the above answers are "NO", then may proceed with Cephalosporin use.   . Ibuprofen Other (See Comments)    Stomach discomfort  . Keflex [Cephalexin] Swelling and Rash    Past Medical History, Surgical history, Social history, and Family History were reviewed and  updated.  Review of Systems: Review of Systems  Constitutional: Positive for malaise/fatigue.  HENT: Positive for sore throat.   Eyes: Negative.   Respiratory: Positive for shortness of breath.   Cardiovascular: Positive for palpitations and leg swelling.  Gastrointestinal: Positive for nausea.  Genitourinary: Positive for urgency.  Musculoskeletal: Positive for joint pain.  Skin: Negative.   Neurological: Positive for focal weakness.  Endo/Heme/Allergies: Negative.   Psychiatric/Behavioral: Negative.       Physical Exam:  oral temperature is 98.6 F (37 C). Her blood pressure is 109/66 and her pulse is 99. Her respiration is 20 and oxygen saturation is 97%.   Wt Readings from Last 3 Encounters:  07/08/17 130 lb (59  kg)  07/06/17 130 lb (59 kg)  06/23/17 137 lb (62.1 kg)    Physical Exam  Constitutional: She is oriented to person, place, and time.  HENT:  Head: Normocephalic and atraumatic.  Mouth/Throat: Oropharynx is clear and moist.  Eyes: EOM are normal. Pupils are equal, round, and reactive to light.  Neck: Normal range of motion.  Cardiovascular: Normal rate, regular rhythm and normal heart sounds.  Pulmonary/Chest: Effort normal and breath sounds normal.  Abdominal: Soft. Bowel sounds are normal.  Musculoskeletal: Normal range of motion. She exhibits no edema, tenderness or deformity.  She has moderate swelling of the right arm.  She has tenderness in the right forearm.  She has decreased range of motion of her right index finger.  Lymphadenopathy:    She has no cervical adenopathy.  Neurological: She is alert and oriented to person, place, and time.  Skin: Skin is warm and dry. No rash noted. No erythema.  Psychiatric: She has a normal mood and affect. Her behavior is normal. Judgment and thought content normal.  Vitals reviewed.   Lab Results  Component Value Date   WBC 62.0 Repeated and verified X2. (HH) 03/05/2017   HGB 10.4 (L) 03/05/2017   HCT 32.5  (L) 07/12/2017   MCV 72.2 (L) 07/12/2017   PLT 107.0 (L) 03/05/2017   Lab Results  Component Value Date   FERRITIN 301 (H) 07/12/2017   IRON 26 (L) 07/12/2017   TIBC 243 07/12/2017   UIBC 217 07/12/2017   IRONPCTSAT 11 (L) 07/12/2017   Lab Results  Component Value Date   RBC 4.50 07/12/2017   No results found for: KPAFRELGTCHN, LAMBDASER, [facility] Lab Results  Component Value Date   IGGSERUM 136 (L) 12/13/2015   IGA 18 (L) 12/13/2015   IGMSERUM <5 (L) 12/13/2015   Lab Results  Component Value Date   TOTALPROTELP 5.7 (L) 06/06/2012   ALBUMINELP 66.8 (H) 06/06/2012   A1GS 5.0 (H) 06/06/2012   A2GS 14.4 (H) 06/06/2012   BETS 7.0 06/06/2012   BETA2SER 3.3 06/06/2012   GAMS 3.5 (L) 06/06/2012   MSPIKE NOT DET 06/06/2012   SPEI * 06/06/2012     Chemistry      Component Value Date/Time   NA 142 07/12/2017 0824   NA 132 10/29/2016 1218   NA 135 (L) 03/29/2015 0908   K 3.8 07/12/2017 0824   K 3.5 10/29/2016 1218   K 3.5 03/29/2015 0908   CL 101 07/12/2017 0824   CL 94 (L) 10/29/2016 1218   CO2 27 07/12/2017 0824   CO2 27 10/29/2016 1218   CO2 25 03/29/2015 0908   BUN 12 07/12/2017 0824   BUN 4 (L) 10/29/2016 1218   BUN 7.9 03/29/2015 0908   CREATININE 0.67 06/02/2017 0938   CREATININE 0.8 10/29/2016 1218   CREATININE 0.7 03/29/2015 0908      Component Value Date/Time   CALCIUM 8.4 07/12/2017 0824   CALCIUM 9.0 10/29/2016 1218   CALCIUM 8.6 03/29/2015 0908   ALKPHOS 88 (H) 07/12/2017 0824   ALKPHOS 94 (H) 10/29/2016 1218   ALKPHOS 63 03/29/2015 0908   AST 18 07/12/2017 0824   AST 10 03/29/2015 0908   ALT 22 07/12/2017 0824   ALT 16 10/29/2016 1218   ALT 11 03/29/2015 0908   BILITOT 0.6 07/12/2017 0824   BILITOT 0.44 03/29/2015 0908     Impression and Plan: Ms. Scheid is a very pleasant 84 yo [place] female with history of CLL and iron deficiency anemia.  I  am absolutely stunned by this finding in the right forearm.  I would have to think that this  has to be something with her leukemia.  I would not think that she has a Richter's transformation.  She really has no other symptoms to go along with this.  When she sees Dr. Mylo Red at [facility], this will be incredibly important.  I am sure Dr. Mylo Red will biopsy this.  This is obviously too large to be resected.  I am sure that she will give me a call with what she finds.  If this is related to her CLL, I think we have to quickly get her on treatment.  She was treated with single agent bendamustine 41/2 years ago.  I would think that we would have to get her on some kind of therapy that we can elicit a quick response to.  Once I hear back from Dr. Mylo Red, we will then proceed to get Ms. Camino back in.  I spent about 40 minutes with she and her son.  She is incredibly nice.  Her son is done a very good job with her.  Ll see her back in 2 months.  [name], MD 1/14/20195:56 PM

## 2017-07-12 NOTE — Telephone Encounter (Signed)
Notified by Richardson Landry in lab of a panic WBC count of 53.7.  Dr. [name] notified. No orders received.

## 2017-07-13 ENCOUNTER — Ambulatory Visit: Payer: Medicare Other | Admitting: Hematology & Oncology

## 2017-07-13 ENCOUNTER — Telehealth: Payer: Self-pay | Admitting: *Deleted

## 2017-07-13 ENCOUNTER — Other Ambulatory Visit: Payer: Medicare Other

## 2017-07-13 ENCOUNTER — Other Ambulatory Visit: Payer: Self-pay | Admitting: *Deleted

## 2017-07-13 MED ORDER — HYDROCODONE-ACETAMINOPHEN 5-325 MG PO TABS: 1.0000 | ORAL_TABLET | Freq: Four times a day (QID) | ORAL | 0 refills | Status: DC | PRN

## 2017-07-13 NOTE — Telephone Encounter (Addendum)
Patient is c/o pain to her wrist. This was assessed yesterday and biopsy completed. Results are pending and will determine treatment plan. The son states the patient is having severe pain with minimal reduction with the prescribed tramadol. He would like to know if something stronger can be prescribed.  Reviewed with Dr [name]. He will send in a new prescription. Son is aware. Pharmacy confirmed.

## 2017-07-14 ENCOUNTER — Encounter: Payer: Self-pay | Admitting: Family Medicine

## 2017-07-19 ENCOUNTER — Telehealth: Payer: Self-pay

## 2017-07-19 NOTE — Telephone Encounter (Signed)
Received VM from both son and daughter questioning if pathology has resulted. States the tumor continues to increase in size and is painful.   Per Dr [name], pathology has not be received. Dr Sondra Come contacted to initiate radiation therapy to the site. Pt's son aware of this information and to expect a call from XRT at [facility]. Verbalizes understanding and appreciation. dph

## 2017-07-20 ENCOUNTER — Other Ambulatory Visit: Payer: Self-pay | Admitting: *Deleted

## 2017-07-20 ENCOUNTER — Encounter: Payer: Self-pay | Admitting: Hematology & Oncology

## 2017-07-20 ENCOUNTER — Encounter: Payer: Self-pay | Admitting: Radiation Oncology

## 2017-07-20 DIAGNOSIS — C9110 Chronic lymphocytic leukemia of B-cell type not having achieved remission: Secondary | ICD-10-CM

## 2017-07-21 NOTE — Progress Notes (Signed)
Histology and Location of Primary Cancer:Chronic lymphocytic leukemia   Location(s) of Symptomatic tumor(s): right forearm mass that started 8 weeks ago  Past/Anticipated chemotherapy by medical oncology, if any: patient not able to tolerate chemotherapy.  Patient's main complaints related to symptomatic tumor(s) are: pain 8/10 takes tramadol q 8 hours.  The tendons are beng contracted and her fingers are being pulled tight.   SAFETY ISSUES:  Prior radiation? no  Pacemaker/ICD? no  Possible current pregnancy? no  Is the patient on methotrexate? No.  Additional Complaints / other details:  Patient is here with her son and Optometrist.  BP (!) 105/56 (BP Location: Left Arm, Patient Position: Sitting)   Pulse (!) 106   Temp 99 F (37.2 C) (Oral)   Ht 5' (1.524 m)   SpO2 94%   BMI 25.39 kg/m

## 2017-07-22 ENCOUNTER — Other Ambulatory Visit: Payer: Self-pay

## 2017-07-22 ENCOUNTER — Ambulatory Visit
Admission: RE | Admit: 2017-07-22 | Discharge: 2017-07-22 | Disposition: A | Discharge status: Home / Self Care | Payer: Medicare Other | Source: Ambulatory Visit | Attending: Radiation Oncology | Admitting: Radiation Oncology

## 2017-07-22 ENCOUNTER — Encounter: Payer: Self-pay | Admitting: Radiation Oncology

## 2017-07-22 ENCOUNTER — Ambulatory Visit: Payer: Medicare Other | Admitting: Hematology & Oncology

## 2017-07-22 ENCOUNTER — Other Ambulatory Visit: Payer: Medicare Other

## 2017-07-22 VITALS — BP 105/56 | HR 106 | Temp 99.0°F | Ht 60.0 in

## 2017-07-22 DIAGNOSIS — Z510 Encounter for antineoplastic radiation therapy: Secondary | ICD-10-CM | POA: Insufficient documentation

## 2017-07-22 DIAGNOSIS — R200 Anesthesia of skin: Secondary | ICD-10-CM | POA: Insufficient documentation

## 2017-07-22 DIAGNOSIS — Z9221 Personal history of antineoplastic chemotherapy: Secondary | ICD-10-CM | POA: Insufficient documentation

## 2017-07-22 DIAGNOSIS — G893 Neoplasm related pain (acute) (chronic): Secondary | ICD-10-CM | POA: Insufficient documentation

## 2017-07-22 DIAGNOSIS — C9110 Chronic lymphocytic leukemia of B-cell type not having achieved remission: Secondary | ICD-10-CM | POA: Diagnosis not present

## 2017-07-22 NOTE — Progress Notes (Signed)
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 female who has a history of chronic lymphocytic leukemia. She was diagnosed with CLL 4 years ago. She reports undergoing one round of chemotherapy but  was unable to tolerate the medication. She reports being stable since. She does have splenomegaly from her CLL. Her last splenic ultrasound was in May 2018. She had minimal splenomegaly.   She presented to Dr. Barbaraann Barthel on 07/08/17 with a 6 week history of right wrist swelling. MRI of the right wrist at that time revealed a large complex mass centered in the volar compartment of the forearm. This mass measures 12 x 4 x 5.4 cm. There is an area of central necrosis. The differential included sarcoma. However, also felt possible to be a lymphomatous mass. Patient presented to Dr. Mylo Red on 07/12/17. Biopsy of the mass initially revealed a collection of cells consistent with lymphoid mass secondary to CLL.    Patient presents with an interpreter and with her son. Her son reports she feels a burning sensation in the right index finger, and significant numbness in the right thumb and 3rd, 4th and 5th digit.   The tendons are being contracted and her fingers are being pulled tight. The patient's son reports she has severe pain to the wrist that is worse at night. The patient presents today to review the role of radiation therapy as part of her disease management.   PREVIOUS RADIATION THERAPY: No  PAST MEDICAL HISTORY:  has a past medical history of Abnormal LFTs (liver function tests), Abscess,  Abscess, abdomen (10/16/2014), Anemia, iron deficiency (06/06/2012), Arthritis, Asthma, Asthma, moderate persistent (08/27/2015), Back pain (06/09/2014), Blood transfusion without reported diagnosis, Cataract, CHF (congestive heart failure) ([place]) (12/20/2015), Chronic lymphatic leukemia ([place]), Chronic lymphocytic leukemia ([place]) (06/06/2012), Chronic respiratory failure ([place]) (12/02/2015), CLL (chronic lymphocytic leukemia) ([place]), Colovesical fistula, Constipation, COPD (chronic obstructive pulmonary disease) ([place]), Dependent edema (12/18/2015), Diverticulitis, Diverticulitis of colon (12/04/2014), Diverticulitis of large intestine with abscess without bleeding, Diverticulitis of large intestine with perforation and abscess without bleeding (10/16/2014), Dysphagia, Dyspnea (10/05/2014), Early satiety, Fall, Gallstones, Gallstones without obstruction of gallbladder, Gastric polyps, GERD (gastroesophageal reflux disease), Hyponatremia (10/16/2014), Labral tear of left hip joint (08/13/2016), Left hip pain, Lumbar compression fracture ([place]) (06/08/2014), Malnutrition of moderate degree ([place]) (11/02/2014), Midline low back pain without sciatica, Neuromuscular disorder ([place]), Shingles, Tear of left acetabular labrum (08/12/2016), Thrombocytopenia ([place]) (10/16/2014), Unable to bear weight (08/12/2016), and UTI (urinary tract infection) (12/13/2015).    PAST SURGICAL HISTORY: Past Surgical History:  Procedure Laterality Date  . ESOPHAGOGASTRODUODENOSCOPY N/A 11/07/2014   Procedure: ESOPHAGOGASTRODUODENOSCOPY (EGD);  Surgeon: Gatha Mayer, MD;  Location: [facility];  Service: Endoscopy;  Laterality: N/A;  . none      FAMILY HISTORY: family history includes Diabetes in her father; Gallbladder disease in her mother; Stomach cancer in her sister.  SOCIAL HISTORY:  reports that  has never smoked. she has never used smokeless tobacco. She reports that she does not drink alcohol or use drugs.  ALLERGIES: Citrus; Other; Augmentin  [amoxicillin-pot clavulanate]; Ibuprofen; and Keflex [cephalexin]  MEDICATIONS:  Current Outpatient Medications  Medication Sig Dispense Refill  .  albuterol (PROVENTIL) (2.5 MG/3ML) 0.083% nebulizer solution Take 3 mLs (2.5 mg total) by nebulization 2 (two) times daily. 180 mL 3  . albuterol (VENTOLIN HFA) 108 (90 Base) MCG/ACT inhaler INHALE 1-2 PUFFS INTO THE LUNGS EVERY 6 HOURS AS NEEDED FOR WHEEZING OR SHORTNESS OF BREATH. 18 Inhaler 0  . Ascorbic Acid (VITAMIN C) 1000 MG tablet Take 1,000 mg by mouth daily.    . calcitonin, salmon, (MIACALCIN/FORTICAL) 200 UNIT/ACT nasal spray Place 1 spray into alternate nostrils daily. 3.7 mL 12  . DULERA 200-5 MCG/ACT AERO INHALE 2 PUFFS INTO THE LUNGS 2 (TWO) TIMES DAILY. 13 Inhaler 5  . gabapentin (NEURONTIN) 100 MG capsule TAKE 2 CAPSULES BY MOUTH 3 TIMES A DAY AS NEEDED FOR NERVE PAIN 180 capsule 0  . GARLIC PO Take 1 tablet by mouth daily.    Marland Kitchen HYDROcodone-acetaminophen (NORCO) 5-325 MG tablet Take 1-2 tablets by mouth every 6 (six) hours as needed for moderate pain. 60 tablet 0  . hyoscyamine (LEVSIN SL) 0.125 MG SL tablet PLACE 1 TABLET (0.125 MG TOTAL) UNDER THE TONGUE EVERY 8 (EIGHT) HOURS AS NEEDED. (Patient taking differently: Take 0.125 mg by mouth every 8 (eight) hours as needed for cramping. Marland Kitchen) 60 tablet 2  . meloxicam (MOBIC) 7.5 MG tablet Take 1 tablet (7.5 mg total) by mouth daily. 30 tablet 0  . Multiple Vitamin (MULTIVITAMIN WITH MINERALS) TABS tablet Take 1 tablet by mouth daily.    Marland Kitchen omeprazole (PRILOSEC) 20 MG capsule TAKE ONE CAPSULE BY MOUTH EVERY DAY 90 capsule 1  . polyethylene glycol powder (MIRALAX) powder Take 17 g by mouth daily. 255 g 0  . potassium chloride (KLOR-CON 10) 10 MEQ tablet Take 1 tab daily with torsemide. 30 tablet 5  . predniSONE (DELTASONE) 10 MG tablet TAKE 1 TABLET BY MOUTH EVERY DAY 30 tablet 0  . psyllium (METAMUCIL) 58.6 % packet Take 1 packet by mouth daily. 30 each 0  . senna (SENOKOT) 8.6 MG TABS  tablet Take 1-2 tablets by mouth daily as needed for mild constipation.    . Simethicone (GAS-X PO) Take 2 capsules by mouth daily as needed (for gas).     . sucralfate (CARAFATE) 1 g tablet TAKE 2 TABLETS IN THE MORNING AND AT BEDTIME 120 tablet 0  . torsemide (DEMADEX) 20 MG tablet Take 1 tablet (20 mg total) by mouth daily. 90 tablet 3  . traMADol (ULTRAM) 50 MG tablet Take 1 tablet (50 mg total) by mouth every 8 (eight) hours as needed (Pain). 90 tablet 0  . vitamin B-12 (CYANOCOBALAMIN) 500 MCG tablet Take 500 mcg by mouth daily.     No current facility-administered medications for this encounter.     REVIEW OF SYSTEMS:  A 10+ POINT REVIEW OF SYSTEMS WAS OBTAINED including neurology, dermatology, psychiatry, cardiac, respiratory, lymph, extremities, GI, GU, musculoskeletal, constitutional, reproductive, HEENT. All pertinent positives are noted in the HPI. All others are negative.    PHYSICAL EXAM:  height is 5' (1.524 m). Her oral temperature is 99 F (37.2 C). Her blood pressure is 105/56 (abnormal) and her pulse is 106 (abnormal). Her oxygen saturation is 94%.   Lungs are clear to auscultation bilaterally. Heart has regular rate and rhythm. No palpable cervical, supraclavicular, or axillary adenopathy. Abdomen soft, non-tender, normal bowel sounds. Patient presents in a wheelchair. Right forearm is markedly swollen with a soft tissue mass extending from wrist to elbow. The patient has numbness of all of her fingers except her index finger. She is unable  to grip with her right hand. Radius and ulnar pulses are strong. The patient's right hand is warm with good capillary refill.    ECOG = 2  0 - Asymptomatic (Fully active, able to carry on all predisease activities without restriction)  1 - Symptomatic but completely ambulatory (Restricted in physically strenuous activity but ambulatory and able to carry out work of a light or sedentary nature. For example, light housework, office  work)  2 - Symptomatic, <50% in bed during the day (Ambulatory and capable of all self care but unable to carry out any work activities. Up and about more than 50% of waking hours)  3 - Symptomatic, >50% in bed, but not bedbound (Capable of only limited self-care, confined to bed or chair 50% or more of waking hours)  4 - Bedbound (Completely disabled. Cannot carry on any self-care. Totally confined to bed or chair)  5 - Death   Eustace Pen MM, Creech RH, Tormey DC, et al. [phone]). "Toxicity and response criteria of the [facility]". [place] Oncol. 5 (6): 649-55  LABORATORY DATA:  Lab Results  Component Value Date   WBC 53.7 (HH) 07/12/2017   HGB 10.4 (L) 03/05/2017   HCT 32.5 (L) 07/12/2017   MCV 72.2 (L) 07/12/2017   PLT 134 (L) 07/12/2017   NEUTROABS 4.8 07/12/2017   Lab Results  Component Value Date   NA 142 07/12/2017   K 3.8 07/12/2017   CL 101 07/12/2017   CO2 27 07/12/2017   GLUCOSE 122 (H) 07/12/2017   CREATININE 0.67 06/02/2017   CALCIUM 8.4 07/12/2017      RADIOGRAPHY: Dg Wrist Complete Right  Result Date: 06/23/2017 CLINICAL DATA:  Right wrist pain from the carpals extending into the fingers as well as into the DIS forearm. Limited history available. EXAM: RIGHT WRIST - COMPLETE 3+ VIEW COMPARISON:  None in PACs FINDINGS: The bones are subjectively mildly osteopenic. There is moderate narrowing of the intercarpal and carpometacarpal joints with exception of the first [facility] joint where there is severe narrowing. The radiocarpal and ulnocarpal joint spaces are reasonably well-maintained. As best as can be determined no acute carpal bone fracture is present. The distal radius and ulna and the metacarpals appear intact as well. IMPRESSION: Moderate to severe osteoarthritic change of the intercarpal and carpometacarpal joints as described. No acute fracture nor dislocation is observed. Electronically Signed   By: David  Martinique M.D.   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 She cannot tolerate conventional therapy for CLL. She would be a candidate for palliative radiation therapy directed to her right forearm region. I discussed the course of treatment, short-term side effects, and possible long-term toxicities of radiotherapy with the patient and her son. She appears to understand and wishes to proceed with treatment. I anticipate 10-15 fractions at a rate of 2 Gy per fraction of palliative treatment to the right forearm. A consent form was signed by the patient's son and a copy was placed in the patient's chart.  PLAN: CT simulation and treatment planning is scheduled for 07/23/17 at 8 AM. Anticipate treatment to begin 07/26/17.     ------------------------------------------------  Blair Promise, PhD, MD  This document serves as a record of services personally performed by Gery Pray, MD. It was created on his behalf by Bethann Humble, a trained medical scribe. The creation of  this record is based on the scribe's personal observations and the provider's statements to them. This document has been checked and approved by the attending provider.

## 2017-07-23 ENCOUNTER — Ambulatory Visit
Admission: RE | Admit: 2017-07-23 | Discharge: 2017-07-23 | Disposition: A | Discharge status: Home / Self Care | Payer: Medicare Other | Source: Ambulatory Visit | Attending: Radiation Oncology | Admitting: Radiation Oncology

## 2017-07-23 ENCOUNTER — Ambulatory Visit: Payer: Medicare Other | Admitting: Hematology & Oncology

## 2017-07-23 ENCOUNTER — Other Ambulatory Visit: Payer: Medicare Other

## 2017-07-23 DIAGNOSIS — R200 Anesthesia of skin: Secondary | ICD-10-CM | POA: Diagnosis not present

## 2017-07-23 DIAGNOSIS — C9110 Chronic lymphocytic leukemia of B-cell type not having achieved remission: Secondary | ICD-10-CM | POA: Diagnosis not present

## 2017-07-23 DIAGNOSIS — G893 Neoplasm related pain (acute) (chronic): Secondary | ICD-10-CM | POA: Diagnosis not present

## 2017-07-23 DIAGNOSIS — Z9221 Personal history of antineoplastic chemotherapy: Secondary | ICD-10-CM | POA: Diagnosis not present

## 2017-07-23 DIAGNOSIS — C9190 Lymphoid leukemia, unspecified not having achieved remission: Secondary | ICD-10-CM | POA: Diagnosis not present

## 2017-07-23 DIAGNOSIS — Z510 Encounter for antineoplastic radiation therapy: Secondary | ICD-10-CM | POA: Diagnosis not present

## 2017-07-23 NOTE — Addendum Note (Signed)
Encounter addended by: Jacqulyn Liner, RN on: 07/23/2017 7:15 AM  Actions taken: Visit Navigator Flowsheet section accepted

## 2017-07-25 NOTE — Progress Notes (Signed)
  Radiation Oncology         [phone] ________________________________  Name: Stefanie Henry MRN: [id number]  Date: 07/23/2017  DOB: 1930/06/21  SIMULATION AND TREATMENT PLANNING NOTE    ICD-10-CM   1. 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  Identity was confirmed.  All relevant records and images related to the planned course of therapy were reviewed.  The patient freely provided informed written consent to proceed with treatment after reviewing the details related to the planned course of therapy. The consent form was witnessed and verified by the simulation staff.  Then, the patient was set-up in a stable reproducible  supine position for radiation therapy.  CT images were obtained.  Surface markings were placed.  The CT images were loaded into the planning software.  Then the target and avoidance structures were contoured.  Treatment planning then occurred.  The radiation prescription was entered and confirmed.  Then, I designed and supervised the construction of a total of 3 medically necessary complex treatment devices.  I have requested : Isodose Plan.  I have ordered:dose calc.  PLAN:  The patient will receive 30 Gy in 15 fractions but will consider shortening the course of treatment if she has a rapid response.  -----------------------------------  Blair Promise, PhD, MD

## 2017-07-26 ENCOUNTER — Ambulatory Visit
Admission: RE | Admit: 2017-07-26 | Discharge: 2017-07-26 | Disposition: A | Discharge status: Home / Self Care | Payer: Medicare Other | Source: Ambulatory Visit | Attending: Radiation Oncology | Admitting: Radiation Oncology

## 2017-07-26 DIAGNOSIS — C9110 Chronic lymphocytic leukemia of B-cell type not having achieved remission: Secondary | ICD-10-CM | POA: Diagnosis not present

## 2017-07-26 DIAGNOSIS — Z9221 Personal history of antineoplastic chemotherapy: Secondary | ICD-10-CM | POA: Diagnosis not present

## 2017-07-26 DIAGNOSIS — C9190 Lymphoid leukemia, unspecified not having achieved remission: Secondary | ICD-10-CM | POA: Diagnosis not present

## 2017-07-26 DIAGNOSIS — G893 Neoplasm related pain (acute) (chronic): Secondary | ICD-10-CM | POA: Diagnosis not present

## 2017-07-26 DIAGNOSIS — R200 Anesthesia of skin: Secondary | ICD-10-CM | POA: Diagnosis not present

## 2017-07-26 DIAGNOSIS — Z510 Encounter for antineoplastic radiation therapy: Secondary | ICD-10-CM | POA: Diagnosis not present

## 2017-07-26 NOTE — Progress Notes (Signed)
  Radiation Oncology         [phone] ________________________________  Name: Stefanie Henry MRN: [id number]  Date: 07/26/2017  DOB: 20-Jun-1930  Simulation Verification Note    ICD-10-CM   1. CLL (chronic lymphocytic leukemia) (HCC) C91.90     Status: outpatient  NARRATIVE: The patient was brought to the treatment unit and placed in the planned treatment position. The clinical setup was verified. Then port films were obtained and uploaded to the radiation oncology medical record software.  The treatment beams were carefully compared against the planned radiation fields. The position location and shape of the radiation fields was reviewed. They targeted volume of tissue appears to be appropriately covered by the radiation beams. Organs at risk appear to be excluded as planned.  Based on my personal review, I approved the simulation verification. The patient's treatment will proceed as planned.  -----------------------------------  Blair Promise, PhD, MD

## 2017-07-27 ENCOUNTER — Ambulatory Visit: Payer: Medicare Other

## 2017-07-27 ENCOUNTER — Ambulatory Visit
Admission: RE | Admit: 2017-07-27 | Discharge: 2017-07-27 | Disposition: A | Discharge status: Home / Self Care | Payer: Medicare Other | Source: Ambulatory Visit | Attending: Radiation Oncology | Admitting: Radiation Oncology

## 2017-07-27 DIAGNOSIS — C9110 Chronic lymphocytic leukemia of B-cell type not having achieved remission: Secondary | ICD-10-CM

## 2017-07-27 DIAGNOSIS — G893 Neoplasm related pain (acute) (chronic): Secondary | ICD-10-CM | POA: Diagnosis not present

## 2017-07-27 DIAGNOSIS — Z510 Encounter for antineoplastic radiation therapy: Secondary | ICD-10-CM | POA: Diagnosis not present

## 2017-07-27 DIAGNOSIS — R200 Anesthesia of skin: Secondary | ICD-10-CM | POA: Diagnosis not present

## 2017-07-27 DIAGNOSIS — Z9221 Personal history of antineoplastic chemotherapy: Secondary | ICD-10-CM | POA: Diagnosis not present

## 2017-07-27 MED ORDER — SONAFINE EX EMUL
1.0000 "application " | Freq: Two times a day (BID) | CUTANEOUS | Status: DC
  Administered 2017-07-27: 1 via TOPICAL

## 2017-07-27 NOTE — Progress Notes (Signed)
Pt here for patient teaching.  Pt given Radiation and You booklet, skin care instructions and Sonafine.  Reviewed areas of pertinence such as fatigue and skin changes . Pt able to give teach back of to pat skin and use unscented/gentle soap,apply Sonafine bid and avoid applying anything to skin within 4 hours of treatment. Pt demonstrated understanding, needs reinforcement, no evidence of learning, refused teaching and of information given and will contact nursing with any questions or concerns.     Http://[email or web address]/treatmentinformation/[email or web address]

## 2017-07-28 ENCOUNTER — Ambulatory Visit
Admission: RE | Admit: 2017-07-28 | Discharge: 2017-07-28 | Disposition: A | Discharge status: Home / Self Care | Payer: Medicare Other | Source: Ambulatory Visit | Attending: Radiation Oncology | Admitting: Radiation Oncology

## 2017-07-28 DIAGNOSIS — G893 Neoplasm related pain (acute) (chronic): Secondary | ICD-10-CM | POA: Diagnosis not present

## 2017-07-28 DIAGNOSIS — C9110 Chronic lymphocytic leukemia of B-cell type not having achieved remission: Secondary | ICD-10-CM | POA: Diagnosis not present

## 2017-07-28 DIAGNOSIS — R200 Anesthesia of skin: Secondary | ICD-10-CM | POA: Diagnosis not present

## 2017-07-28 DIAGNOSIS — Z510 Encounter for antineoplastic radiation therapy: Secondary | ICD-10-CM | POA: Diagnosis not present

## 2017-07-28 DIAGNOSIS — Z9221 Personal history of antineoplastic chemotherapy: Secondary | ICD-10-CM | POA: Diagnosis not present

## 2017-07-29 ENCOUNTER — Ambulatory Visit
Admission: RE | Admit: 2017-07-29 | Discharge: 2017-07-29 | Disposition: A | Discharge status: Home / Self Care | Payer: Medicare Other | Source: Ambulatory Visit | Attending: Radiation Oncology | Admitting: Radiation Oncology

## 2017-07-29 DIAGNOSIS — G893 Neoplasm related pain (acute) (chronic): Secondary | ICD-10-CM | POA: Diagnosis not present

## 2017-07-29 DIAGNOSIS — Z510 Encounter for antineoplastic radiation therapy: Secondary | ICD-10-CM | POA: Diagnosis not present

## 2017-07-29 DIAGNOSIS — C9110 Chronic lymphocytic leukemia of B-cell type not having achieved remission: Secondary | ICD-10-CM | POA: Diagnosis not present

## 2017-07-29 DIAGNOSIS — R200 Anesthesia of skin: Secondary | ICD-10-CM | POA: Diagnosis not present

## 2017-07-29 DIAGNOSIS — Z9221 Personal history of antineoplastic chemotherapy: Secondary | ICD-10-CM | POA: Diagnosis not present

## 2017-07-30 ENCOUNTER — Ambulatory Visit
Admission: RE | Admit: 2017-07-30 | Discharge: 2017-07-30 | Disposition: A | Discharge status: Home / Self Care | Payer: Medicare Other | Source: Ambulatory Visit | Attending: Radiation Oncology | Admitting: Radiation Oncology

## 2017-07-30 ENCOUNTER — Telehealth: Payer: Self-pay | Admitting: Oncology

## 2017-07-30 DIAGNOSIS — C9190 Lymphoid leukemia, unspecified not having achieved remission: Secondary | ICD-10-CM | POA: Diagnosis not present

## 2017-07-30 DIAGNOSIS — R200 Anesthesia of skin: Secondary | ICD-10-CM | POA: Diagnosis not present

## 2017-07-30 DIAGNOSIS — G893 Neoplasm related pain (acute) (chronic): Secondary | ICD-10-CM | POA: Diagnosis not present

## 2017-07-30 DIAGNOSIS — Z9221 Personal history of antineoplastic chemotherapy: Secondary | ICD-10-CM | POA: Diagnosis not present

## 2017-07-30 DIAGNOSIS — C9110 Chronic lymphocytic leukemia of B-cell type not having achieved remission: Secondary | ICD-10-CM | POA: Diagnosis not present

## 2017-07-30 DIAGNOSIS — Z510 Encounter for antineoplastic radiation therapy: Secondary | ICD-10-CM | POA: Diagnosis not present

## 2017-07-30 NOTE — Telephone Encounter (Signed)
[facility] to see if prescription for Norco was picked up.  The pharmacist said patient's son picked it up on 1/15 at 4:30 pm.  Advised patient's son that he did pick up the prescription and he verbalized agreement.

## 2017-07-30 NOTE — Progress Notes (Signed)

## 2017-08-02 ENCOUNTER — Telehealth: Payer: Self-pay | Admitting: Oncology

## 2017-08-02 ENCOUNTER — Ambulatory Visit
Admission: RE | Admit: 2017-08-02 | Discharge: 2017-08-02 | Disposition: A | Discharge status: Home / Self Care | Payer: Medicare Other | Source: Ambulatory Visit | Attending: Radiation Oncology | Admitting: Radiation Oncology

## 2017-08-02 DIAGNOSIS — C9110 Chronic lymphocytic leukemia of B-cell type not having achieved remission: Secondary | ICD-10-CM | POA: Diagnosis not present

## 2017-08-02 DIAGNOSIS — Z9221 Personal history of antineoplastic chemotherapy: Secondary | ICD-10-CM | POA: Diagnosis not present

## 2017-08-02 DIAGNOSIS — Z510 Encounter for antineoplastic radiation therapy: Secondary | ICD-10-CM | POA: Diagnosis not present

## 2017-08-02 DIAGNOSIS — G893 Neoplasm related pain (acute) (chronic): Secondary | ICD-10-CM | POA: Diagnosis not present

## 2017-08-02 DIAGNOSIS — R200 Anesthesia of skin: Secondary | ICD-10-CM | POA: Diagnosis not present

## 2017-08-02 NOTE — Progress Notes (Signed)
  Radiation Oncology         [phone] ________________________________  Name: Stefanie Henry MRN: [id number]  Date: 08/02/2017  DOB: 05-21-1930  SIMULATION AND TREATMENT PLANNING NOTE    ICD-10-CM   1. 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  Identity was confirmed.  All relevant records and images related to the planned course of therapy were reviewed.  The patient freely provided informed written consent to proceed with treatment after reviewing the details related to the planned course of therapy. The consent form was witnessed and verified by the simulation staff.  Then, the patient was set-up in a stable reproducible  supine position for radiation therapy.  CT images were obtained.  Surface markings were placed.  The CT images were loaded into the planning software.  Then the target and avoidance structures were contoured.  Treatment planning then occurred.   Patient underwent repeat scan as the patient was noted to have increased swelling and pain in the right forearm despite her radiation treatments. The patient's son is requesting scan for comparison purposes to see if her tumor is growing during her radiation therapy.  PLAN:  Estimated volumes from the tumor prior to starting radiation therapy and during radiation therapy will be obtained from  planning CT scans for further evaluation of swelling issues.  Patient also be placed on the prednisone 60 mg daily with a taper beginning every 5 days. She has been on 10 mg of prednisone daily for chronic basis.  -----------------------------------  Blair Promise, PhD, MD

## 2017-08-02 NOTE — Telephone Encounter (Signed)
Called in prescription for a prednisone taper per Dr. Sondra Come.  Also asked pharmacist about a soft arm sling and she said they have them at the pharmacy.

## 2017-08-03 ENCOUNTER — Other Ambulatory Visit: Payer: Self-pay | Admitting: Pulmonary Disease

## 2017-08-03 ENCOUNTER — Ambulatory Visit: Payer: Medicare Other

## 2017-08-04 ENCOUNTER — Ambulatory Visit: Payer: Medicare Other

## 2017-08-05 ENCOUNTER — Ambulatory Visit
Admission: RE | Admit: 2017-08-05 | Discharge: 2017-08-05 | Disposition: A | Discharge status: Home / Self Care | Payer: Medicare Other | Source: Ambulatory Visit | Attending: Radiation Oncology | Admitting: Radiation Oncology

## 2017-08-05 DIAGNOSIS — C9110 Chronic lymphocytic leukemia of B-cell type not having achieved remission: Secondary | ICD-10-CM | POA: Diagnosis not present

## 2017-08-05 DIAGNOSIS — Z9221 Personal history of antineoplastic chemotherapy: Secondary | ICD-10-CM | POA: Diagnosis not present

## 2017-08-05 DIAGNOSIS — R200 Anesthesia of skin: Secondary | ICD-10-CM | POA: Diagnosis not present

## 2017-08-05 DIAGNOSIS — Z510 Encounter for antineoplastic radiation therapy: Secondary | ICD-10-CM | POA: Diagnosis not present

## 2017-08-05 DIAGNOSIS — G893 Neoplasm related pain (acute) (chronic): Secondary | ICD-10-CM | POA: Diagnosis not present

## 2017-08-06 ENCOUNTER — Ambulatory Visit: Payer: Medicare Other

## 2017-08-09 ENCOUNTER — Ambulatory Visit
Admission: RE | Admit: 2017-08-09 | Discharge: 2017-08-09 | Disposition: A | Discharge status: Home / Self Care | Payer: Medicare Other | Source: Ambulatory Visit | Attending: Radiation Oncology | Admitting: Radiation Oncology

## 2017-08-09 DIAGNOSIS — Z9221 Personal history of antineoplastic chemotherapy: Secondary | ICD-10-CM | POA: Diagnosis not present

## 2017-08-09 DIAGNOSIS — G893 Neoplasm related pain (acute) (chronic): Secondary | ICD-10-CM | POA: Diagnosis not present

## 2017-08-09 DIAGNOSIS — R200 Anesthesia of skin: Secondary | ICD-10-CM | POA: Diagnosis not present

## 2017-08-09 DIAGNOSIS — Z510 Encounter for antineoplastic radiation therapy: Secondary | ICD-10-CM | POA: Diagnosis not present

## 2017-08-09 DIAGNOSIS — C9110 Chronic lymphocytic leukemia of B-cell type not having achieved remission: Secondary | ICD-10-CM | POA: Diagnosis not present

## 2017-08-10 ENCOUNTER — Ambulatory Visit
Admission: RE | Admit: 2017-08-10 | Discharge: 2017-08-10 | Disposition: A | Discharge status: Home / Self Care | Payer: Medicare Other | Source: Ambulatory Visit | Attending: Radiation Oncology | Admitting: Radiation Oncology

## 2017-08-10 DIAGNOSIS — R200 Anesthesia of skin: Secondary | ICD-10-CM | POA: Diagnosis not present

## 2017-08-10 DIAGNOSIS — Z510 Encounter for antineoplastic radiation therapy: Secondary | ICD-10-CM | POA: Diagnosis not present

## 2017-08-10 DIAGNOSIS — Z9221 Personal history of antineoplastic chemotherapy: Secondary | ICD-10-CM | POA: Diagnosis not present

## 2017-08-10 DIAGNOSIS — G893 Neoplasm related pain (acute) (chronic): Secondary | ICD-10-CM | POA: Diagnosis not present

## 2017-08-10 DIAGNOSIS — C9110 Chronic lymphocytic leukemia of B-cell type not having achieved remission: Secondary | ICD-10-CM | POA: Diagnosis not present

## 2017-08-11 ENCOUNTER — Ambulatory Visit
Admission: RE | Admit: 2017-08-11 | Discharge: 2017-08-11 | Disposition: A | Discharge status: Home / Self Care | Payer: Medicare Other | Source: Ambulatory Visit | Attending: Radiation Oncology | Admitting: Radiation Oncology

## 2017-08-11 DIAGNOSIS — G893 Neoplasm related pain (acute) (chronic): Secondary | ICD-10-CM | POA: Diagnosis not present

## 2017-08-11 DIAGNOSIS — C9190 Lymphoid leukemia, unspecified not having achieved remission: Secondary | ICD-10-CM | POA: Diagnosis not present

## 2017-08-11 DIAGNOSIS — R200 Anesthesia of skin: Secondary | ICD-10-CM | POA: Diagnosis not present

## 2017-08-11 DIAGNOSIS — C9110 Chronic lymphocytic leukemia of B-cell type not having achieved remission: Secondary | ICD-10-CM | POA: Diagnosis not present

## 2017-08-11 DIAGNOSIS — Z510 Encounter for antineoplastic radiation therapy: Secondary | ICD-10-CM | POA: Diagnosis not present

## 2017-08-11 DIAGNOSIS — Z9221 Personal history of antineoplastic chemotherapy: Secondary | ICD-10-CM | POA: Diagnosis not present

## 2017-08-12 ENCOUNTER — Ambulatory Visit
Admission: RE | Admit: 2017-08-12 | Discharge: 2017-08-12 | Disposition: A | Discharge status: Home / Self Care | Payer: Medicare Other | Source: Ambulatory Visit | Attending: Radiation Oncology | Admitting: Radiation Oncology

## 2017-08-12 DIAGNOSIS — Z510 Encounter for antineoplastic radiation therapy: Secondary | ICD-10-CM | POA: Diagnosis not present

## 2017-08-12 DIAGNOSIS — R200 Anesthesia of skin: Secondary | ICD-10-CM | POA: Diagnosis not present

## 2017-08-12 DIAGNOSIS — C9110 Chronic lymphocytic leukemia of B-cell type not having achieved remission: Secondary | ICD-10-CM | POA: Diagnosis not present

## 2017-08-12 DIAGNOSIS — Z9221 Personal history of antineoplastic chemotherapy: Secondary | ICD-10-CM | POA: Diagnosis not present

## 2017-08-12 DIAGNOSIS — G893 Neoplasm related pain (acute) (chronic): Secondary | ICD-10-CM | POA: Diagnosis not present

## 2017-08-13 ENCOUNTER — Ambulatory Visit: Payer: Medicare Other

## 2017-08-13 ENCOUNTER — Ambulatory Visit
Admission: RE | Admit: 2017-08-13 | Discharge: 2017-08-13 | Disposition: A | Discharge status: Home / Self Care | Payer: Medicare Other | Source: Ambulatory Visit | Attending: Radiation Oncology | Admitting: Radiation Oncology

## 2017-08-13 DIAGNOSIS — Z510 Encounter for antineoplastic radiation therapy: Secondary | ICD-10-CM | POA: Diagnosis not present

## 2017-08-13 DIAGNOSIS — Z9221 Personal history of antineoplastic chemotherapy: Secondary | ICD-10-CM | POA: Diagnosis not present

## 2017-08-13 DIAGNOSIS — G893 Neoplasm related pain (acute) (chronic): Secondary | ICD-10-CM | POA: Diagnosis not present

## 2017-08-13 DIAGNOSIS — R200 Anesthesia of skin: Secondary | ICD-10-CM | POA: Diagnosis not present

## 2017-08-13 DIAGNOSIS — C9110 Chronic lymphocytic leukemia of B-cell type not having achieved remission: Secondary | ICD-10-CM | POA: Diagnosis not present

## 2017-08-16 ENCOUNTER — Telehealth: Payer: Self-pay | Admitting: Oncology

## 2017-08-16 ENCOUNTER — Ambulatory Visit
Admission: RE | Admit: 2017-08-16 | Discharge: 2017-08-16 | Disposition: A | Discharge status: Home / Self Care | Payer: Medicare Other | Source: Ambulatory Visit | Attending: Radiation Oncology | Admitting: Radiation Oncology

## 2017-08-16 ENCOUNTER — Other Ambulatory Visit: Payer: Self-pay | Admitting: Radiation Oncology

## 2017-08-16 ENCOUNTER — Encounter: Payer: Self-pay | Admitting: Radiation Oncology

## 2017-08-16 DIAGNOSIS — Z9221 Personal history of antineoplastic chemotherapy: Secondary | ICD-10-CM | POA: Diagnosis not present

## 2017-08-16 DIAGNOSIS — Z510 Encounter for antineoplastic radiation therapy: Secondary | ICD-10-CM | POA: Diagnosis not present

## 2017-08-16 DIAGNOSIS — C9110 Chronic lymphocytic leukemia of B-cell type not having achieved remission: Secondary | ICD-10-CM | POA: Diagnosis not present

## 2017-08-16 DIAGNOSIS — G893 Neoplasm related pain (acute) (chronic): Secondary | ICD-10-CM | POA: Diagnosis not present

## 2017-08-16 DIAGNOSIS — R200 Anesthesia of skin: Secondary | ICD-10-CM | POA: Diagnosis not present

## 2017-08-16 DIAGNOSIS — C9190 Lymphoid leukemia, unspecified not having achieved remission: Secondary | ICD-10-CM | POA: Diagnosis not present

## 2017-08-16 MED ORDER — SONAFINE EX EMUL
1.0000 "application " | Freq: Once | CUTANEOUS | Status: DC
  Administered 2017-08-17: 1 via TOPICAL

## 2017-08-16 NOTE — Telephone Encounter (Signed)
[place] and spoke to [facility] regarding adding on a physical therapy referral for lymphedema in patient's right forearm.  She said she will have physical therapy go out this week Wednesday or Thursday.

## 2017-08-17 ENCOUNTER — Inpatient Hospital Stay: Admission: RE | Admit: 2017-08-17 | Payer: Medicare Other | Source: Ambulatory Visit

## 2017-08-17 ENCOUNTER — Telehealth: Payer: Self-pay | Admitting: *Deleted

## 2017-08-17 ENCOUNTER — Other Ambulatory Visit: Payer: Self-pay | Admitting: Radiation Oncology

## 2017-08-17 MED ORDER — DOXYCYCLINE HYCLATE 100 MG PO TABS: 100.0000 mg | ORAL_TABLET | Freq: Two times a day (BID) | ORAL | 0 refills | Status: DC

## 2017-08-17 NOTE — Telephone Encounter (Signed)
CALLED PATIENT TO INFORM OF FU APPT. WITH DR. KINARD ON 08/26/17 @ 4:15 PM, LVM FOR A RETURN CALL

## 2017-08-18 ENCOUNTER — Telehealth: Payer: Self-pay | Admitting: Oncology

## 2017-08-18 NOTE — Telephone Encounter (Signed)
Called patient's son and left a message that Dr. Sondra Come has been notified of the numbness and is hoping the antibiotic will help with the swelling in her right forearm.

## 2017-08-18 NOTE — Progress Notes (Signed)
  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     Indication for treatment:  Pain control       Radiation treatment dates:   07/26/2017 to 08/16/2017  Site/dose:    1. The Right forearm was treated to 14 Gy in 7 fractions of 2 Gy. 2. The Right forearm was treated to 18 Gy in 6 fractions of 3 Gy. Cumulative dose 32 gray  She was treated to a total of 32 Gy to the Right forearm.  Beams/energy:    Ap/pa  // 6X photons  Narrative: The patient tolerated radiation treatment relatively well. She did not have any improvement in her swelling, forearm mass size or pain throughout the course of treatment.   Plan: The patient has completed radiation treatment. She is on prednisone taper at this time. Exam is concerning for cellulitis and the patient will be placed on antibiotic therapy after consultation with pharmacy, in light of her advanced age and other medical issues. The patient will return to radiation oncology clinic for close followup in one week. I advised them to call or return sooner if they have any questions or concerns related to their recovery or treatment.  -----------------------------------  Blair Promise, PhD, MD  This document serves as a record of services personally performed by Gery Pray, MD. It was created on his behalf by Arlyce Harman, a trained medical scribe. The creation of this record is based on the scribe's personal observations and the provider's statements to them. This document has been checked and approved by the attending provider.

## 2017-08-18 NOTE — Telephone Encounter (Signed)
Patient's daughter in law, Apolonio Schneiders, left a message saying that Korene's right hand has been getting numb.  She wants to know if they should be alarmed about this and is asking if anything needs to be done.  She requested a call back at [phone].

## 2017-08-19 ENCOUNTER — Telehealth: Payer: Self-pay | Admitting: Oncology

## 2017-08-19 DIAGNOSIS — C9190 Lymphoid leukemia, unspecified not having achieved remission: Secondary | ICD-10-CM | POA: Diagnosis not present

## 2017-08-19 DIAGNOSIS — Z9981 Dependence on supplemental oxygen: Secondary | ICD-10-CM | POA: Diagnosis not present

## 2017-08-19 DIAGNOSIS — D509 Iron deficiency anemia, unspecified: Secondary | ICD-10-CM | POA: Diagnosis not present

## 2017-08-19 DIAGNOSIS — D696 Thrombocytopenia, unspecified: Secondary | ICD-10-CM | POA: Diagnosis not present

## 2017-08-19 DIAGNOSIS — I5032 Chronic diastolic (congestive) heart failure: Secondary | ICD-10-CM | POA: Diagnosis not present

## 2017-08-19 DIAGNOSIS — M1990 Unspecified osteoarthritis, unspecified site: Secondary | ICD-10-CM | POA: Diagnosis not present

## 2017-08-19 DIAGNOSIS — R1310 Dysphagia, unspecified: Secondary | ICD-10-CM | POA: Diagnosis not present

## 2017-08-19 DIAGNOSIS — J4540 Moderate persistent asthma, uncomplicated: Secondary | ICD-10-CM | POA: Diagnosis not present

## 2017-08-19 DIAGNOSIS — Z7951 Long term (current) use of inhaled steroids: Secondary | ICD-10-CM | POA: Diagnosis not present

## 2017-08-19 DIAGNOSIS — E440 Moderate protein-calorie malnutrition: Secondary | ICD-10-CM | POA: Diagnosis not present

## 2017-08-19 NOTE — Telephone Encounter (Signed)
Talked with [facility] about orders for nursing care, nursing aid care and PT for lymphedema in her right forearm.  Verbal order given.

## 2017-08-20 ENCOUNTER — Encounter: Payer: Self-pay | Admitting: Oncology

## 2017-08-24 DIAGNOSIS — D509 Iron deficiency anemia, unspecified: Secondary | ICD-10-CM | POA: Diagnosis not present

## 2017-08-24 DIAGNOSIS — C9190 Lymphoid leukemia, unspecified not having achieved remission: Secondary | ICD-10-CM | POA: Diagnosis not present

## 2017-08-24 DIAGNOSIS — J449 Chronic obstructive pulmonary disease, unspecified: Secondary | ICD-10-CM | POA: Diagnosis not present

## 2017-08-24 DIAGNOSIS — R600 Localized edema: Secondary | ICD-10-CM | POA: Diagnosis not present

## 2017-08-24 DIAGNOSIS — J9611 Chronic respiratory failure with hypoxia: Secondary | ICD-10-CM | POA: Diagnosis not present

## 2017-08-24 DIAGNOSIS — M6281 Muscle weakness (generalized): Secondary | ICD-10-CM | POA: Diagnosis not present

## 2017-08-24 DIAGNOSIS — I5032 Chronic diastolic (congestive) heart failure: Secondary | ICD-10-CM | POA: Diagnosis not present

## 2017-08-24 DIAGNOSIS — D696 Thrombocytopenia, unspecified: Secondary | ICD-10-CM | POA: Diagnosis not present

## 2017-08-24 DIAGNOSIS — R1310 Dysphagia, unspecified: Secondary | ICD-10-CM | POA: Diagnosis not present

## 2017-08-24 DIAGNOSIS — J4540 Moderate persistent asthma, uncomplicated: Secondary | ICD-10-CM | POA: Diagnosis not present

## 2017-08-25 DIAGNOSIS — D696 Thrombocytopenia, unspecified: Secondary | ICD-10-CM | POA: Diagnosis not present

## 2017-08-25 DIAGNOSIS — J4540 Moderate persistent asthma, uncomplicated: Secondary | ICD-10-CM | POA: Diagnosis not present

## 2017-08-25 DIAGNOSIS — D509 Iron deficiency anemia, unspecified: Secondary | ICD-10-CM | POA: Diagnosis not present

## 2017-08-25 DIAGNOSIS — C9190 Lymphoid leukemia, unspecified not having achieved remission: Secondary | ICD-10-CM | POA: Diagnosis not present

## 2017-08-25 DIAGNOSIS — R1310 Dysphagia, unspecified: Secondary | ICD-10-CM | POA: Diagnosis not present

## 2017-08-25 DIAGNOSIS — I5032 Chronic diastolic (congestive) heart failure: Secondary | ICD-10-CM | POA: Diagnosis not present

## 2017-08-25 DIAGNOSIS — R52 Pain, unspecified: Secondary | ICD-10-CM | POA: Diagnosis not present

## 2017-08-26 ENCOUNTER — Ambulatory Visit: Payer: Medicare Other | Admitting: Radiation Oncology

## 2017-08-26 ENCOUNTER — Telehealth: Payer: Self-pay | Admitting: *Deleted

## 2017-08-26 NOTE — Telephone Encounter (Signed)
Patients spouse called stating she was diagnosed by PCP with blood clot in left leg.  She is getting blood thinners and is having a lot of discomfort and unable to walk.  She needs to reschedule todays appointment for next week if possible.  Spouse states she has a lot of discoloration and swelling still to that arm.  Sent to scheduler to get on next weeks schedule.

## 2017-08-30 ENCOUNTER — Telehealth: Payer: Self-pay | Admitting: Oncology

## 2017-08-30 NOTE — Telephone Encounter (Signed)
Patient's son called and would like Dr. Sondra Come to talk with Dr. [name] to arrange chemotherapy as soon as possible.  He said her arm has not changed at all and they would like to try chemotherapy.  He also mentioned that his mom had a blood clot in her leg that is being treated with blood thinners.

## 2017-08-31 ENCOUNTER — Inpatient Hospital Stay (HOSPITAL_BASED_OUTPATIENT_CLINIC_OR_DEPARTMENT_OTHER): Payer: Medicare Other | Admitting: Hematology & Oncology

## 2017-08-31 ENCOUNTER — Telehealth: Payer: Self-pay | Admitting: Pharmacist

## 2017-08-31 ENCOUNTER — Other Ambulatory Visit: Payer: Self-pay

## 2017-08-31 ENCOUNTER — Other Ambulatory Visit: Payer: Self-pay | Admitting: *Deleted

## 2017-08-31 ENCOUNTER — Telehealth: Payer: Self-pay | Admitting: Hematology & Oncology

## 2017-08-31 ENCOUNTER — Inpatient Hospital Stay: Payer: Medicare Other | Attending: Hematology & Oncology

## 2017-08-31 ENCOUNTER — Telehealth: Payer: Self-pay | Admitting: *Deleted

## 2017-08-31 VITALS — BP 110/47 | HR 108 | Temp 98.8°F | Resp 20 | Wt 130.0 lb

## 2017-08-31 DIAGNOSIS — Z79899 Other long term (current) drug therapy: Secondary | ICD-10-CM | POA: Insufficient documentation

## 2017-08-31 DIAGNOSIS — R5381 Other malaise: Secondary | ICD-10-CM

## 2017-08-31 DIAGNOSIS — Z923 Personal history of irradiation: Secondary | ICD-10-CM | POA: Insufficient documentation

## 2017-08-31 DIAGNOSIS — Z881 Allergy status to other antibiotic agents status: Secondary | ICD-10-CM

## 2017-08-31 DIAGNOSIS — R531 Weakness: Secondary | ICD-10-CM | POA: Diagnosis not present

## 2017-08-31 DIAGNOSIS — R3915 Urgency of urination: Secondary | ICD-10-CM | POA: Insufficient documentation

## 2017-08-31 DIAGNOSIS — R110 Nausea: Secondary | ICD-10-CM

## 2017-08-31 DIAGNOSIS — C9110 Chronic lymphocytic leukemia of B-cell type not having achieved remission: Secondary | ICD-10-CM

## 2017-08-31 DIAGNOSIS — R5383 Other fatigue: Secondary | ICD-10-CM

## 2017-08-31 DIAGNOSIS — R2231 Localized swelling, mass and lump, right upper limb: Secondary | ICD-10-CM

## 2017-08-31 DIAGNOSIS — Z7901 Long term (current) use of anticoagulants: Secondary | ICD-10-CM | POA: Insufficient documentation

## 2017-08-31 DIAGNOSIS — R600 Localized edema: Secondary | ICD-10-CM | POA: Diagnosis not present

## 2017-08-31 DIAGNOSIS — D509 Iron deficiency anemia, unspecified: Secondary | ICD-10-CM | POA: Diagnosis not present

## 2017-08-31 DIAGNOSIS — R0602 Shortness of breath: Secondary | ICD-10-CM

## 2017-08-31 DIAGNOSIS — Z880 Allergy status to penicillin: Secondary | ICD-10-CM

## 2017-08-31 DIAGNOSIS — C91Z0 Other lymphoid leukemia not having achieved remission: Secondary | ICD-10-CM | POA: Diagnosis not present

## 2017-08-31 LAB — CBC WITH DIFFERENTIAL (CANCER CENTER ONLY)
BASOS ABS: 0 10*3/uL (ref 0.0–0.1)
BASOS PCT: 0 %
Eosinophils Absolute: 0.1 10*3/uL (ref 0.0–0.5)
Eosinophils Relative: 0 %
HEMATOCRIT: 32.5 % — AB (ref 34.8–46.6)
HEMOGLOBIN: 10.1 g/dL — AB (ref 11.6–15.9)
LYMPHS PCT: 87 %
Lymphs Abs: 59.1 10*3/uL — ABNORMAL HIGH (ref 0.9–3.3)
MCH: 22 pg — ABNORMAL LOW (ref 26.0–34.0)
MCHC: 31.1 g/dL — ABNORMAL LOW (ref 32.0–36.0)
MCV: 70.7 fL — ABNORMAL LOW (ref 81.0–101.0)
Monocytes Absolute: 0.4 10*3/uL (ref 0.1–0.9)
Monocytes Relative: 1 %
NEUTROS ABS: 8.4 10*3/uL — AB (ref 1.5–6.5)
NEUTROS PCT: 12 %
Platelet Count: 159 10*3/uL (ref 145–400)
RBC: 4.6 MIL/uL (ref 3.70–5.32)
RDW: 15.7 % (ref 11.1–15.7)
WBC: 68 10*3/uL — AB (ref 3.9–10.0)

## 2017-08-31 LAB — CMP (CANCER CENTER ONLY)
ALK PHOS: 97 U/L — AB (ref 26–84)
ALT: 21 U/L (ref 10–47)
ANION GAP: 10 (ref 5–15)
AST: 17 U/L (ref 11–38)
Albumin: 2.9 g/dL — ABNORMAL LOW (ref 3.5–5.0)
BILIRUBIN TOTAL: 0.7 mg/dL (ref 0.2–1.6)
BUN: 13 mg/dL (ref 7–22)
CO2: 29 mmol/L (ref 18–33)
Calcium: 9 mg/dL (ref 8.0–10.3)
Chloride: 96 mmol/L — ABNORMAL LOW (ref 98–108)
Creatinine: 0.9 mg/dL (ref 0.60–1.20)
Glucose, Bld: 146 mg/dL — ABNORMAL HIGH (ref 73–118)
POTASSIUM: 3.6 mmol/L (ref 3.3–4.7)
SODIUM: 135 mmol/L (ref 128–145)
TOTAL PROTEIN: 5.8 g/dL — AB (ref 6.4–8.1)

## 2017-08-31 LAB — LACTATE DEHYDROGENASE: LDH: 181 U/L (ref 125–245)

## 2017-08-31 MED ORDER — IBRUTINIB 280 MG PO TABS: 280.0000 mg | ORAL_TABLET | Freq: Every day | ORAL | 4 refills | Status: DC

## 2017-08-31 NOTE — Telephone Encounter (Signed)
Oral Oncology Patient Advocate Encounter  Received notification from [facility] that prior authorization for Kate Sable is required.  PA submitted on CoverMyMeds Key [id number] Status is pending  Oral Oncology Clinic will continue to follow.    [place] Patient Advocate [phone] 08/31/2017 12:30 PM

## 2017-08-31 NOTE — Telephone Encounter (Signed)
Critical Value WBC 68 Dr [name] notified. No orders at this time

## 2017-08-31 NOTE — Telephone Encounter (Addendum)
Oral Oncology Pharmacist Encounter  Received new prescription for Imbruvica (ibrutinib) for the treatment of CLL, planned duration until disease progression or unacceptable drug toxicity.  CBC/CMP from 08/31/17 assessed, no relevant lab abnormalities. Prescription dose and frequency assessed.   Current medication list in Epic reviewed, a few DDIs with ibrutinib identified: - There is an increased risk of bleeding when ibrutinib is used in combination with an anticoagulate like apixaban (Eliquis). Ms Dolata should be actively monitored for bleeding. Reviewed patient chart in Epic to determine indication for the apixaban. Ended up contacting the prescribing provider, Dr Clovia Cuff. After speaking with Dr. Daphene Jaeger I was informed she was started on apixaban for a clot in her left leg.  - Ibrutinib may increase the antiplatelet effects of meloxicam. Patient should be monitored for bleeding and a decrease in platelets.   Prescription has been e-scribed to the [facility] for benefits analysis and approval.  Oral Oncology Clinic will continue to follow for insurance authorization, copayment issues, initial counseling and start date.  Darl Pikes, PharmD, BCPS Hematology/Oncology Clinical Pharmacist [facility] [phone]  08/31/2017 12:38 PM

## 2017-09-01 LAB — BETA 2 MICROGLOBULIN, SERUM: Beta-2 Microglobulin: 2.7 mg/L — ABNORMAL HIGH (ref 0.6–2.4)

## 2017-09-01 LAB — IGG, IGA, IGM
IGA: 39 mg/dL — AB (ref 64–422)
IgG (Immunoglobin G), Serum: 148 mg/dL — ABNORMAL LOW (ref 700–1600)
IgM (Immunoglobulin M), Srm: <5 mg/dL — ABNORMAL LOW (ref 26–217)

## 2017-09-01 LAB — TECHNOLOGIST SMEAR REVIEW

## 2017-09-01 NOTE — Telephone Encounter (Signed)
Oral Oncology Patient Advocate Encounter  Prior Authorization for Stefanie Henry has been approved.    PA# [id number] Effective dates: 06/27/2017 through 06/28/2018  Oral Oncology Clinic will continue to follow.   Co-pay $ 3.80   [place] Patient Advocate [phone] 09/01/2017 2:22 PM

## 2017-09-01 NOTE — Progress Notes (Signed)
Hematology and Oncology Follow Up Visit  Stefanie Henry [id number] 1930-03-26 84 y.o. 09/01/2017   Principle Diagnosis:  Chronic lymphocytic leukemia - observation only, did not tolerate treatment and discontinued Iron deficiency anemia Mass in the right forearm  Current Therapy:   Status post radiation therapy to right forearm mass-poor response to date.  Ibrutinib 280 mg p.o. daily-start therapy on 09/03/2017    Interim History:  Stefanie Henry is here today with her son and daughter for a follow-up.  We last saw her, she had developed this large mass in the right forearm.  We sent her to [facility]  She had a biopsy done there.  I was told that this was consistent with CLL.  She went to [facility] for radiation therapy.  She has completed radiation therapy with very little response of the tumor.  She still has marked swelling of the right forearm.  I am absolutely puzzled as to why she has not responded.  I am going to get another biopsy of this mass.  I really need to make sure that we are not dealing with a different kind of malignancy.  I talked to the patient and her son/daughter.  She needs to have an MRI done.  We will then proceed with a biopsy.  As far as her CLL is concerned, this really has not progressed that much.  Her white cell count is really not gone up that much.  I think that we do need to start her on therapy.  May be systemic therapy will help.  I think that it would be wise to try her on ibrutinib.  This is oral.  She has very poor IV access.  As such, oral based therapy would really be beneficial.  I would not give her full dose ibrutinib.  I will start her off at 280 mg p.o. daily.    Overall, her performance status is ECOG 2-3.      Medications:  Allergies as of 08/31/2017      Reactions   Citrus Other (See Comments)   Stomach discomfort   Other    Cannot take any antibiotics, causes rash   Augmentin [amoxicillin-pot Clavulanate] Swelling, Rash   Has  patient had a PCN reaction causing immediate rash, facial/tongue/throat swelling, SOB or lightheadedness with hypotension: Unknown Has patient had a PCN reaction causing severe rash involving mucus membranes or skin necrosis: Unknown Has patient had a PCN reaction that required hospitalization: Unknown Has patient had a PCN reaction occurring within the last 10 years: Unknown If all of the above answers are "NO", then may proceed with Cephalosporin use.   Ibuprofen Other (See Comments)   Stomach discomfort   Keflex [cephalexin] Swelling, Rash      Medication List        Accurate as of 08/31/17 11:59 PM. Always use your most recent med list.          albuterol (2.5 MG/3ML) 0.083% nebulizer solution Commonly known as:  PROVENTIL Take 3 mLs (2.5 mg total) by nebulization 2 (two) times daily.   albuterol 108 (90 Base) MCG/ACT inhaler Commonly known as:  VENTOLIN HFA INHALE 1-2 PUFFS INTO THE LUNGS EVERY 6 HOURS AS NEEDED FOR WHEEZING OR SHORTNESS OF BREATH.   calcitonin (salmon) 200 UNIT/ACT nasal spray Commonly known as:  MIACALCIN/FORTICAL Place 1 spray into alternate nostrils daily.   doxycycline 100 MG tablet Commonly known as:  VIBRA-TABS Take 1 tablet (100 mg total) by mouth 2 (two) times daily.   DULERA  200-5 MCG/ACT Aero Generic drug:  mometasone-formoterol INHALE 2 PUFFS INTO THE LUNGS 2 (TWO) TIMES DAILY.   ELIQUIS 5 MG Tabs tablet Generic drug:  apixaban Take 5 mg by mouth 2 (two) times daily.   gabapentin 100 MG capsule Commonly known as:  NEURONTIN TAKE 2 CAPSULES BY MOUTH 3 TIMES A DAY AS NEEDED FOR NERVE PAIN   GARLIC PO Take 1 tablet by mouth daily.   GAS-X PO Take 2 capsules by mouth daily as needed (for gas).   HYDROcodone-acetaminophen 5-325 MG tablet Commonly known as:  NORCO Take 1-2 tablets by mouth every 6 (six) hours as needed for moderate pain.   hyoscyamine 0.125 MG SL tablet Commonly known as:  LEVSIN SL PLACE 1 TABLET (0.125 MG TOTAL)  UNDER THE TONGUE EVERY 8 (EIGHT) HOURS AS NEEDED.   Ibrutinib 280 MG Tabs Take 280 mg by mouth daily after breakfast.   meloxicam 7.5 MG tablet Commonly known as:  MOBIC Take 1 tablet (7.5 mg total) by mouth daily.   multivitamin with minerals Tabs tablet Take 1 tablet by mouth daily.   omeprazole 20 MG capsule Commonly known as:  PRILOSEC TAKE ONE CAPSULE BY MOUTH EVERY DAY   polyethylene glycol powder powder Commonly known as:  MIRALAX Take 17 g by mouth daily.   potassium chloride 10 MEQ tablet Commonly known as:  KLOR-CON 10 Take 1 tab daily with torsemide.   predniSONE 10 MG tablet Commonly known as:  DELTASONE Take 10 mg by mouth daily with breakfast. Take 6 tablets for 5 days, then 5 tablets for 5 days, then 4 tablets for 5 days, then 3 tablets for 5 days, then 2 tablets for 5 days then take 1 tablet daily.   predniSONE 10 MG tablet Commonly known as:  DELTASONE TAKE 1 TABLET BY MOUTH EVERY DAY   psyllium 58.6 % packet Commonly known as:  METAMUCIL Take 1 packet by mouth daily.   senna 8.6 MG Tabs tablet Commonly known as:  SENOKOT Take 1-2 tablets by mouth daily as needed for mild constipation.   sucralfate 1 g tablet Commonly known as:  CARAFATE TAKE 2 TABLETS IN THE MORNING AND AT BEDTIME   torsemide 20 MG tablet Commonly known as:  DEMADEX Take 1 tablet (20 mg total) by mouth daily.   traMADol 50 MG tablet Commonly known as:  ULTRAM Take 1 tablet (50 mg total) by mouth every 8 (eight) hours as needed (Pain).   vitamin B-12 500 MCG tablet Commonly known as:  CYANOCOBALAMIN Take 500 mcg by mouth daily.   vitamin C 1000 MG tablet Take 1,000 mg by mouth daily.       Allergies:  Allergies  Allergen Reactions  . Citrus Other (See Comments)    Stomach discomfort  . Other     Cannot take any antibiotics, causes rash  . Augmentin [Amoxicillin-Pot Clavulanate] Swelling and Rash    Has patient had a PCN reaction causing immediate rash,  facial/tongue/throat swelling, SOB or lightheadedness with hypotension: Unknown Has patient had a PCN reaction causing severe rash involving mucus membranes or skin necrosis: Unknown Has patient had a PCN reaction that required hospitalization: Unknown Has patient had a PCN reaction occurring within the last 10 years: Unknown If all of the above answers are "NO", then may proceed with Cephalosporin use.   . Ibuprofen Other (See Comments)    Stomach discomfort  . Keflex [Cephalexin] Swelling and Rash    Past Medical History, Surgical history, Social history, and Family History  were reviewed and updated.  Review of Systems: Review of Systems  Constitutional: Positive for malaise/fatigue.  HENT: Positive for sore throat.   Eyes: Negative.   Respiratory: Positive for shortness of breath.   Cardiovascular: Positive for palpitations and leg swelling.  Gastrointestinal: Positive for nausea.  Genitourinary: Positive for urgency.  Musculoskeletal: Positive for joint pain.  Skin: Negative.   Neurological: Positive for focal weakness.  Endo/Heme/Allergies: Negative.   Psychiatric/Behavioral: Negative.       Physical Exam:  weight is 130 lb (59 kg). Her oral temperature is 98.8 F (37.1 C). Her blood pressure is 110/47 (abnormal) and her pulse is 108 (abnormal). Her respiration is 20 and oxygen saturation is 95%.   Wt Readings from Last 3 Encounters:  08/31/17 130 lb (59 kg)  07/08/17 130 lb (59 kg)  07/06/17 130 lb (59 kg)    Physical Exam  Constitutional: She is oriented to person, place, and time.  HENT:  Head: Normocephalic and atraumatic.  Mouth/Throat: Oropharynx is clear and moist.  Eyes: EOM are normal. Pupils are equal, round, and reactive to light.  Neck: Normal range of motion.  Cardiovascular: Normal rate, regular rhythm and normal heart sounds.  Pulmonary/Chest: Effort normal and breath sounds normal.  Abdominal: Soft. Bowel sounds are normal.  Musculoskeletal:  Normal range of motion. She exhibits no edema, tenderness or deformity.  She has moderate swelling of the right arm.  She has tenderness in the right forearm.  She has decreased range of motion of her right index finger.  Lymphadenopathy:    She has no cervical adenopathy.  Neurological: She is alert and oriented to person, place, and time.  Skin: Skin is warm and dry. No rash noted. No erythema.  Psychiatric: She has a normal mood and affect. Her behavior is normal. Judgment and thought content normal.  Vitals reviewed.   Lab Results  Component Value Date   WBC 68.0 (HH) 08/31/2017   HGB 10.4 (L) 03/05/2017   HCT 32.5 (L) 08/31/2017   MCV 70.7 (L) 08/31/2017   PLT 159 08/31/2017   Lab Results  Component Value Date   FERRITIN 301 (H) 07/12/2017   IRON 26 (L) 07/12/2017   TIBC 243 07/12/2017   UIBC 217 07/12/2017   IRONPCTSAT 11 (L) 07/12/2017   Lab Results  Component Value Date   RBC 4.60 08/31/2017   No results found for: KPAFRELGTCHN, LAMBDASER, KAPLAMBRATIO Lab Results  Component Value Date   IGGSERUM 148 (L) 08/31/2017   IGA 39 (L) 08/31/2017   IGMSERUM <5 (L) 08/31/2017   Lab Results  Component Value Date   TOTALPROTELP 5.7 (L) 06/06/2012   ALBUMINELP 66.8 (H) 06/06/2012   A1GS 5.0 (H) 06/06/2012   A2GS 14.4 (H) 06/06/2012   BETS 7.0 06/06/2012   BETA2SER 3.3 06/06/2012   GAMS 3.5 (L) 06/06/2012   MSPIKE NOT DET 06/06/2012   SPEI * 06/06/2012     Chemistry      Component Value Date/Time   NA 135 08/31/2017 0928   NA 132 10/29/2016 1218   NA 135 (L) 03/29/2015 0908   K 3.6 08/31/2017 0928   K 3.5 10/29/2016 1218   K 3.5 03/29/2015 0908   CL 96 (L) 08/31/2017 0928   CL 94 (L) 10/29/2016 1218   CO2 29 08/31/2017 0928   CO2 27 10/29/2016 1218   CO2 25 03/29/2015 0908   BUN 13 08/31/2017 0928   BUN 4 (L) 10/29/2016 1218   BUN 7.9 03/29/2015 0908   CREATININE 0.90  08/31/2017 0928   CREATININE 0.8 10/29/2016 1218   CREATININE 0.7 03/29/2015 0908        Component Value Date/Time   CALCIUM 9.0 08/31/2017 0928   CALCIUM 9.0 10/29/2016 1218   CALCIUM 8.6 03/29/2015 0908   ALKPHOS 97 (H) 08/31/2017 0928   ALKPHOS 94 (H) 10/29/2016 1218   ALKPHOS 63 03/29/2015 0908   AST 17 08/31/2017 0928   AST 10 03/29/2015 0908   ALT 21 08/31/2017 0928   ALT 16 10/29/2016 1218   ALT 11 03/29/2015 0908   BILITOT 0.7 08/31/2017 0928   BILITOT 0.44 03/29/2015 0908     Impression and Plan: Stefanie Henry is a very pleasant 84 yo [place] female with history of CLL and iron deficiency anemia.  For some reason, I am just not sure as to what is going on.  Again, I think we will have to get a biopsy of this mass again.  We will see what the MRI shows first.  After the MRI is done, then we will see about the biopsy.  Hopefully she will get the Ibrutinib this week.  This is truly a complicated situation.  I spent about 45 minutes with she and her family.  Over 50% of the time was spent face-to-face with them talking with them about the recommendations, the Ibrutinib and his side effects, and the possibility of a biopsy.  I would like to see her back in another couple weeks or so.  [name], MD 3/6/20195:30 PM

## 2017-09-02 ENCOUNTER — Telehealth: Payer: Self-pay | Admitting: Hematology & Oncology

## 2017-09-02 ENCOUNTER — Telehealth: Payer: Self-pay | Admitting: Oncology

## 2017-09-02 MED FILL — IMBRUVICA 280 MG TAB: 280 | 28 days supply | Qty: 28 | Fill #0

## 2017-09-02 NOTE — Telephone Encounter (Signed)
Oral Oncology Patient Advocate Encounter  E-mailed [facility] to let them know CC information and that the patient will pick up this time. Per Hilliard Clark it will be ready today.   [place] Patient Advocate [phone] 09/02/2017 11:59 AM

## 2017-09-02 NOTE — Telephone Encounter (Signed)
Called patient's son about [facility] request for a refill on prednisone.  He said she does not need a refill at this time.  Advised him to call when a refill is needed.

## 2017-09-02 NOTE — Telephone Encounter (Signed)
Oral Chemotherapy Pharmacist Encounter  Patient Education I spoke with patient's son Stefanie Henry for overview of new oral chemotherapy medication: Imbruvica (ibrutinib) for the treatment of CLL, planned duration until disease progression or unacceptable drug toxicity.   Counseled patient's son on administration, dosing, side effects, monitoring, drug-food interactions, safe handling, storage, and disposal. Patient will take 280 mg by mouth daily after breakfast..  Side effects include but not limited to: Decrease plt/WBC/hgb, diarrhea, fatigue, fluid retention, N/V.    Reviewed with patient importance of keeping a medication schedule and plan for any missed doses.  Ms. Ogden son voiced understanding and appreciation. All questions answered. Medication handout was placed in the mail for them.  Provided patient with [facility] phone number. Patient knows to ca ll the office with questions or concerns. Oral Chemotherapy Navigation Clinic will continue to follow.  Darl Pikes, PharmD, BCPS Hematology/Oncology Clinical Pharmacist [facility] [phone]  09/02/2017 3:25 PM

## 2017-09-03 ENCOUNTER — Ambulatory Visit (HOSPITAL_COMMUNITY): Admission: RE | Admit: 2017-09-03 | Payer: Medicare Other | Source: Ambulatory Visit

## 2017-09-03 NOTE — Telephone Encounter (Signed)
Oral Oncology Patient Advocate Encounter  Patients medication was picked up on 09/02/2017 at [facility].   [place] Patient Advocate [phone] 09/03/2017 9:03 AM

## 2017-09-06 ENCOUNTER — Ambulatory Visit: Payer: Medicare Other | Admitting: Radiation Oncology

## 2017-09-06 ENCOUNTER — Telehealth: Payer: Self-pay | Admitting: Oncology

## 2017-09-06 NOTE — Telephone Encounter (Signed)
Called patient's son to see how his mom is feeling.  He said she is very weak and bedridden.  He said she lost a lot of strength with the blood clot in her leg and that her PCP is even seeing her at home.  He is supposed to pick up her chemotherapy medication today but is worried that she is too weak to start taking it.  He is going to call Dr. Antonieta Pert office today about this.  He said her arm seems to change daily with the area of firmness in her inner forearm changing position from upper forearm to wrist and the color of her arm ranges from red to "dark."  He did say her fingers and hand is warm.  He is not able to bring her for follow up today but will call to reschedule in 2 months.

## 2017-09-07 DIAGNOSIS — D696 Thrombocytopenia, unspecified: Secondary | ICD-10-CM | POA: Diagnosis not present

## 2017-09-07 DIAGNOSIS — N390 Urinary tract infection, site not specified: Secondary | ICD-10-CM | POA: Diagnosis not present

## 2017-09-07 DIAGNOSIS — D509 Iron deficiency anemia, unspecified: Secondary | ICD-10-CM | POA: Diagnosis not present

## 2017-09-07 DIAGNOSIS — J4540 Moderate persistent asthma, uncomplicated: Secondary | ICD-10-CM | POA: Diagnosis not present

## 2017-09-07 DIAGNOSIS — C9190 Lymphoid leukemia, unspecified not having achieved remission: Secondary | ICD-10-CM | POA: Diagnosis not present

## 2017-09-07 DIAGNOSIS — R1310 Dysphagia, unspecified: Secondary | ICD-10-CM | POA: Diagnosis not present

## 2017-09-07 DIAGNOSIS — I5032 Chronic diastolic (congestive) heart failure: Secondary | ICD-10-CM | POA: Diagnosis not present

## 2017-09-08 ENCOUNTER — Inpatient Hospital Stay: Payer: Medicare Other | Admitting: Hematology & Oncology

## 2017-09-08 ENCOUNTER — Inpatient Hospital Stay: Payer: Medicare Other

## 2017-09-09 ENCOUNTER — Ambulatory Visit (HOSPITAL_COMMUNITY)
Admission: RE | Admit: 2017-09-09 | Discharge: 2017-09-09 | Disposition: A | Discharge status: Home / Self Care | Payer: Medicare Other | Source: Ambulatory Visit | Attending: Hematology & Oncology | Admitting: Hematology & Oncology

## 2017-09-09 DIAGNOSIS — C9110 Chronic lymphocytic leukemia of B-cell type not having achieved remission: Secondary | ICD-10-CM

## 2017-09-09 DIAGNOSIS — R2231 Localized swelling, mass and lump, right upper limb: Secondary | ICD-10-CM | POA: Diagnosis not present

## 2017-09-09 DIAGNOSIS — R937 Abnormal findings on diagnostic imaging of other parts of musculoskeletal system: Secondary | ICD-10-CM | POA: Insufficient documentation

## 2017-09-09 DIAGNOSIS — C9190 Lymphoid leukemia, unspecified not having achieved remission: Secondary | ICD-10-CM | POA: Diagnosis not present

## 2017-09-09 MED ORDER — GADOBENATE DIMEGLUMINE 529 MG/ML IV SOLN
15.0000 mL | Freq: Once | INTRAVENOUS | Status: AC | PRN
  Administered 2017-09-09: 12 mL via INTRAVENOUS

## 2017-09-10 DIAGNOSIS — D509 Iron deficiency anemia, unspecified: Secondary | ICD-10-CM | POA: Diagnosis not present

## 2017-09-10 DIAGNOSIS — D696 Thrombocytopenia, unspecified: Secondary | ICD-10-CM | POA: Diagnosis not present

## 2017-09-10 DIAGNOSIS — J4540 Moderate persistent asthma, uncomplicated: Secondary | ICD-10-CM | POA: Diagnosis not present

## 2017-09-10 DIAGNOSIS — R1310 Dysphagia, unspecified: Secondary | ICD-10-CM | POA: Diagnosis not present

## 2017-09-10 DIAGNOSIS — I5032 Chronic diastolic (congestive) heart failure: Secondary | ICD-10-CM | POA: Diagnosis not present

## 2017-09-10 DIAGNOSIS — C9190 Lymphoid leukemia, unspecified not having achieved remission: Secondary | ICD-10-CM | POA: Diagnosis not present

## 2017-09-13 ENCOUNTER — Other Ambulatory Visit: Payer: Self-pay | Admitting: Hematology & Oncology

## 2017-09-13 DIAGNOSIS — I5032 Chronic diastolic (congestive) heart failure: Secondary | ICD-10-CM | POA: Diagnosis not present

## 2017-09-13 DIAGNOSIS — D509 Iron deficiency anemia, unspecified: Secondary | ICD-10-CM | POA: Diagnosis not present

## 2017-09-13 DIAGNOSIS — D696 Thrombocytopenia, unspecified: Secondary | ICD-10-CM | POA: Diagnosis not present

## 2017-09-13 DIAGNOSIS — C9190 Lymphoid leukemia, unspecified not having achieved remission: Secondary | ICD-10-CM | POA: Diagnosis not present

## 2017-09-13 DIAGNOSIS — R1310 Dysphagia, unspecified: Secondary | ICD-10-CM | POA: Diagnosis not present

## 2017-09-13 DIAGNOSIS — J4540 Moderate persistent asthma, uncomplicated: Secondary | ICD-10-CM | POA: Diagnosis not present

## 2017-09-13 DIAGNOSIS — C9110 Chronic lymphocytic leukemia of B-cell type not having achieved remission: Secondary | ICD-10-CM

## 2017-09-14 DIAGNOSIS — D509 Iron deficiency anemia, unspecified: Secondary | ICD-10-CM | POA: Diagnosis not present

## 2017-09-14 DIAGNOSIS — C9190 Lymphoid leukemia, unspecified not having achieved remission: Secondary | ICD-10-CM | POA: Diagnosis not present

## 2017-09-14 DIAGNOSIS — D696 Thrombocytopenia, unspecified: Secondary | ICD-10-CM | POA: Diagnosis not present

## 2017-09-14 DIAGNOSIS — I5032 Chronic diastolic (congestive) heart failure: Secondary | ICD-10-CM | POA: Diagnosis not present

## 2017-09-14 DIAGNOSIS — J4540 Moderate persistent asthma, uncomplicated: Secondary | ICD-10-CM | POA: Diagnosis not present

## 2017-09-14 DIAGNOSIS — R1310 Dysphagia, unspecified: Secondary | ICD-10-CM | POA: Diagnosis not present

## 2017-09-15 DIAGNOSIS — D509 Iron deficiency anemia, unspecified: Secondary | ICD-10-CM | POA: Diagnosis not present

## 2017-09-15 DIAGNOSIS — I5032 Chronic diastolic (congestive) heart failure: Secondary | ICD-10-CM | POA: Diagnosis not present

## 2017-09-15 DIAGNOSIS — R1310 Dysphagia, unspecified: Secondary | ICD-10-CM | POA: Diagnosis not present

## 2017-09-15 DIAGNOSIS — J4540 Moderate persistent asthma, uncomplicated: Secondary | ICD-10-CM | POA: Diagnosis not present

## 2017-09-15 DIAGNOSIS — D696 Thrombocytopenia, unspecified: Secondary | ICD-10-CM | POA: Diagnosis not present

## 2017-09-15 DIAGNOSIS — C9190 Lymphoid leukemia, unspecified not having achieved remission: Secondary | ICD-10-CM | POA: Diagnosis not present

## 2017-09-17 ENCOUNTER — Telehealth: Payer: Self-pay | Admitting: Oncology

## 2017-09-17 NOTE — Telephone Encounter (Signed)
Left a message for patient's son to see if a prednisone refill is needed.  Requested a return call.

## 2017-09-20 ENCOUNTER — Other Ambulatory Visit: Payer: Self-pay | Admitting: *Deleted

## 2017-09-20 DIAGNOSIS — J4540 Moderate persistent asthma, uncomplicated: Secondary | ICD-10-CM | POA: Diagnosis not present

## 2017-09-20 DIAGNOSIS — D509 Iron deficiency anemia, unspecified: Secondary | ICD-10-CM | POA: Diagnosis not present

## 2017-09-20 DIAGNOSIS — I5032 Chronic diastolic (congestive) heart failure: Secondary | ICD-10-CM | POA: Diagnosis not present

## 2017-09-20 DIAGNOSIS — R1310 Dysphagia, unspecified: Secondary | ICD-10-CM | POA: Diagnosis not present

## 2017-09-20 DIAGNOSIS — D696 Thrombocytopenia, unspecified: Secondary | ICD-10-CM | POA: Diagnosis not present

## 2017-09-20 DIAGNOSIS — C9190 Lymphoid leukemia, unspecified not having achieved remission: Secondary | ICD-10-CM | POA: Diagnosis not present

## 2017-09-20 MED ORDER — PREDNISONE 10 MG PO TABS: 10.0000 mg | ORAL_TABLET | Freq: Every day | ORAL | 2 refills | Status: DC

## 2017-09-27 DIAGNOSIS — C9190 Lymphoid leukemia, unspecified not having achieved remission: Secondary | ICD-10-CM | POA: Diagnosis not present

## 2017-09-27 DIAGNOSIS — D696 Thrombocytopenia, unspecified: Secondary | ICD-10-CM | POA: Diagnosis not present

## 2017-09-27 DIAGNOSIS — I5032 Chronic diastolic (congestive) heart failure: Secondary | ICD-10-CM | POA: Diagnosis not present

## 2017-09-27 DIAGNOSIS — J4540 Moderate persistent asthma, uncomplicated: Secondary | ICD-10-CM | POA: Diagnosis not present

## 2017-09-27 DIAGNOSIS — R1310 Dysphagia, unspecified: Secondary | ICD-10-CM | POA: Diagnosis not present

## 2017-09-27 DIAGNOSIS — D509 Iron deficiency anemia, unspecified: Secondary | ICD-10-CM | POA: Diagnosis not present

## 2017-09-28 ENCOUNTER — Telehealth: Payer: Self-pay | Admitting: *Deleted

## 2017-09-28 ENCOUNTER — Telehealth: Payer: Self-pay | Admitting: Oncology

## 2017-09-28 DIAGNOSIS — D696 Thrombocytopenia, unspecified: Secondary | ICD-10-CM | POA: Diagnosis not present

## 2017-09-28 DIAGNOSIS — I5032 Chronic diastolic (congestive) heart failure: Secondary | ICD-10-CM | POA: Diagnosis not present

## 2017-09-28 DIAGNOSIS — R1310 Dysphagia, unspecified: Secondary | ICD-10-CM | POA: Diagnosis not present

## 2017-09-28 DIAGNOSIS — J4540 Moderate persistent asthma, uncomplicated: Secondary | ICD-10-CM | POA: Diagnosis not present

## 2017-09-28 DIAGNOSIS — D509 Iron deficiency anemia, unspecified: Secondary | ICD-10-CM | POA: Diagnosis not present

## 2017-09-28 DIAGNOSIS — C9190 Lymphoid leukemia, unspecified not having achieved remission: Secondary | ICD-10-CM | POA: Diagnosis not present

## 2017-09-28 NOTE — Telephone Encounter (Signed)
Mingo Amber from [place] called and asked for an order to continue PT and to add OT for patient.  She said patient still has some swelling in her right arm and would like to see if OT would help.  She was given a verbal order to continue PT and OT.

## 2017-09-28 NOTE — Telephone Encounter (Signed)
Patient's son hasn't allowed patient to start [place] because she is on Eliquis and both drugs can cause bleeding. She needs to be on Eliquis for 6 months and he doesn't know if he can hold Imbruvica that long.   Spoke with Dr [name]. Patient is to NOT hold Imbruvica while on Eliquis. He had thought patient had already started and was unhappy to hear otherwise. He wants patient to start [place] today.  Son is aware of instruction but still seems reluctant. He also stated she's weak and maybe needs to get stronger before starting. Explained that patient's condition will not improve without treatment and he needs to have patient start treatment. Son stated that he understood.  Instructed son to call the office if the patient had any issues with tolerance. Also instructed him to call to schedule an appointment for LAB and PROVIDER for 2 weeks after medication start. He understood.

## 2017-09-29 DIAGNOSIS — J4540 Moderate persistent asthma, uncomplicated: Secondary | ICD-10-CM | POA: Diagnosis not present

## 2017-09-29 DIAGNOSIS — I5032 Chronic diastolic (congestive) heart failure: Secondary | ICD-10-CM | POA: Diagnosis not present

## 2017-09-29 DIAGNOSIS — D696 Thrombocytopenia, unspecified: Secondary | ICD-10-CM | POA: Diagnosis not present

## 2017-09-29 DIAGNOSIS — R1310 Dysphagia, unspecified: Secondary | ICD-10-CM | POA: Diagnosis not present

## 2017-09-29 DIAGNOSIS — D509 Iron deficiency anemia, unspecified: Secondary | ICD-10-CM | POA: Diagnosis not present

## 2017-09-29 DIAGNOSIS — C9190 Lymphoid leukemia, unspecified not having achieved remission: Secondary | ICD-10-CM | POA: Diagnosis not present

## 2017-09-30 DIAGNOSIS — R1310 Dysphagia, unspecified: Secondary | ICD-10-CM | POA: Diagnosis not present

## 2017-09-30 DIAGNOSIS — I5032 Chronic diastolic (congestive) heart failure: Secondary | ICD-10-CM | POA: Diagnosis not present

## 2017-09-30 DIAGNOSIS — D696 Thrombocytopenia, unspecified: Secondary | ICD-10-CM | POA: Diagnosis not present

## 2017-09-30 DIAGNOSIS — C9190 Lymphoid leukemia, unspecified not having achieved remission: Secondary | ICD-10-CM | POA: Diagnosis not present

## 2017-09-30 DIAGNOSIS — D509 Iron deficiency anemia, unspecified: Secondary | ICD-10-CM | POA: Diagnosis not present

## 2017-09-30 DIAGNOSIS — J4540 Moderate persistent asthma, uncomplicated: Secondary | ICD-10-CM | POA: Diagnosis not present

## 2017-10-04 DIAGNOSIS — J4540 Moderate persistent asthma, uncomplicated: Secondary | ICD-10-CM | POA: Diagnosis not present

## 2017-10-04 DIAGNOSIS — C9190 Lymphoid leukemia, unspecified not having achieved remission: Secondary | ICD-10-CM | POA: Diagnosis not present

## 2017-10-04 DIAGNOSIS — I5032 Chronic diastolic (congestive) heart failure: Secondary | ICD-10-CM | POA: Diagnosis not present

## 2017-10-04 DIAGNOSIS — D696 Thrombocytopenia, unspecified: Secondary | ICD-10-CM | POA: Diagnosis not present

## 2017-10-04 DIAGNOSIS — R1310 Dysphagia, unspecified: Secondary | ICD-10-CM | POA: Diagnosis not present

## 2017-10-04 DIAGNOSIS — D509 Iron deficiency anemia, unspecified: Secondary | ICD-10-CM | POA: Diagnosis not present

## 2017-10-05 DIAGNOSIS — R1310 Dysphagia, unspecified: Secondary | ICD-10-CM | POA: Diagnosis not present

## 2017-10-05 DIAGNOSIS — D509 Iron deficiency anemia, unspecified: Secondary | ICD-10-CM | POA: Diagnosis not present

## 2017-10-05 DIAGNOSIS — J4540 Moderate persistent asthma, uncomplicated: Secondary | ICD-10-CM | POA: Diagnosis not present

## 2017-10-05 DIAGNOSIS — D696 Thrombocytopenia, unspecified: Secondary | ICD-10-CM | POA: Diagnosis not present

## 2017-10-05 DIAGNOSIS — I5032 Chronic diastolic (congestive) heart failure: Secondary | ICD-10-CM | POA: Diagnosis not present

## 2017-10-05 DIAGNOSIS — C9190 Lymphoid leukemia, unspecified not having achieved remission: Secondary | ICD-10-CM | POA: Diagnosis not present

## 2017-10-08 DIAGNOSIS — N390 Urinary tract infection, site not specified: Secondary | ICD-10-CM | POA: Diagnosis not present

## 2017-10-08 DIAGNOSIS — R1310 Dysphagia, unspecified: Secondary | ICD-10-CM | POA: Diagnosis not present

## 2017-10-08 DIAGNOSIS — D696 Thrombocytopenia, unspecified: Secondary | ICD-10-CM | POA: Diagnosis not present

## 2017-10-08 DIAGNOSIS — J4540 Moderate persistent asthma, uncomplicated: Secondary | ICD-10-CM | POA: Diagnosis not present

## 2017-10-08 DIAGNOSIS — I5032 Chronic diastolic (congestive) heart failure: Secondary | ICD-10-CM | POA: Diagnosis not present

## 2017-10-08 DIAGNOSIS — D509 Iron deficiency anemia, unspecified: Secondary | ICD-10-CM | POA: Diagnosis not present

## 2017-10-08 DIAGNOSIS — C9190 Lymphoid leukemia, unspecified not having achieved remission: Secondary | ICD-10-CM | POA: Diagnosis not present

## 2017-10-14 DIAGNOSIS — R600 Localized edema: Secondary | ICD-10-CM | POA: Diagnosis not present

## 2017-10-14 DIAGNOSIS — I5032 Chronic diastolic (congestive) heart failure: Secondary | ICD-10-CM | POA: Diagnosis not present

## 2017-10-14 DIAGNOSIS — J449 Chronic obstructive pulmonary disease, unspecified: Secondary | ICD-10-CM | POA: Diagnosis not present

## 2017-10-14 DIAGNOSIS — C9190 Lymphoid leukemia, unspecified not having achieved remission: Secondary | ICD-10-CM | POA: Diagnosis not present

## 2017-10-14 DIAGNOSIS — D509 Iron deficiency anemia, unspecified: Secondary | ICD-10-CM | POA: Diagnosis not present

## 2017-10-14 DIAGNOSIS — N390 Urinary tract infection, site not specified: Secondary | ICD-10-CM | POA: Diagnosis not present

## 2017-10-14 DIAGNOSIS — R1310 Dysphagia, unspecified: Secondary | ICD-10-CM | POA: Diagnosis not present

## 2017-10-14 DIAGNOSIS — D696 Thrombocytopenia, unspecified: Secondary | ICD-10-CM | POA: Diagnosis not present

## 2017-10-14 DIAGNOSIS — J4540 Moderate persistent asthma, uncomplicated: Secondary | ICD-10-CM | POA: Diagnosis not present

## 2017-10-18 DIAGNOSIS — M1990 Unspecified osteoarthritis, unspecified site: Secondary | ICD-10-CM | POA: Diagnosis not present

## 2017-10-18 DIAGNOSIS — C9190 Lymphoid leukemia, unspecified not having achieved remission: Secondary | ICD-10-CM | POA: Diagnosis not present

## 2017-10-18 DIAGNOSIS — I5032 Chronic diastolic (congestive) heart failure: Secondary | ICD-10-CM | POA: Diagnosis not present

## 2017-10-18 DIAGNOSIS — Z9981 Dependence on supplemental oxygen: Secondary | ICD-10-CM | POA: Diagnosis not present

## 2017-10-18 DIAGNOSIS — J4540 Moderate persistent asthma, uncomplicated: Secondary | ICD-10-CM | POA: Diagnosis not present

## 2017-10-18 DIAGNOSIS — D696 Thrombocytopenia, unspecified: Secondary | ICD-10-CM | POA: Diagnosis not present

## 2017-10-18 DIAGNOSIS — D509 Iron deficiency anemia, unspecified: Secondary | ICD-10-CM | POA: Diagnosis not present

## 2017-10-18 DIAGNOSIS — R1310 Dysphagia, unspecified: Secondary | ICD-10-CM | POA: Diagnosis not present

## 2017-10-18 DIAGNOSIS — Z7951 Long term (current) use of inhaled steroids: Secondary | ICD-10-CM | POA: Diagnosis not present

## 2017-10-18 DIAGNOSIS — E440 Moderate protein-calorie malnutrition: Secondary | ICD-10-CM | POA: Diagnosis not present

## 2017-10-21 DIAGNOSIS — D696 Thrombocytopenia, unspecified: Secondary | ICD-10-CM | POA: Diagnosis not present

## 2017-10-21 DIAGNOSIS — C9190 Lymphoid leukemia, unspecified not having achieved remission: Secondary | ICD-10-CM | POA: Diagnosis not present

## 2017-10-21 DIAGNOSIS — J4540 Moderate persistent asthma, uncomplicated: Secondary | ICD-10-CM | POA: Diagnosis not present

## 2017-10-21 DIAGNOSIS — I5032 Chronic diastolic (congestive) heart failure: Secondary | ICD-10-CM | POA: Diagnosis not present

## 2017-10-21 DIAGNOSIS — D509 Iron deficiency anemia, unspecified: Secondary | ICD-10-CM | POA: Diagnosis not present

## 2017-10-21 DIAGNOSIS — R1310 Dysphagia, unspecified: Secondary | ICD-10-CM | POA: Diagnosis not present

## 2017-10-25 DIAGNOSIS — D696 Thrombocytopenia, unspecified: Secondary | ICD-10-CM | POA: Diagnosis not present

## 2017-10-25 DIAGNOSIS — C9190 Lymphoid leukemia, unspecified not having achieved remission: Secondary | ICD-10-CM | POA: Diagnosis not present

## 2017-10-25 DIAGNOSIS — I5032 Chronic diastolic (congestive) heart failure: Secondary | ICD-10-CM | POA: Diagnosis not present

## 2017-10-25 DIAGNOSIS — R1310 Dysphagia, unspecified: Secondary | ICD-10-CM | POA: Diagnosis not present

## 2017-10-25 DIAGNOSIS — J4540 Moderate persistent asthma, uncomplicated: Secondary | ICD-10-CM | POA: Diagnosis not present

## 2017-10-25 DIAGNOSIS — D509 Iron deficiency anemia, unspecified: Secondary | ICD-10-CM | POA: Diagnosis not present

## 2017-10-27 DIAGNOSIS — D509 Iron deficiency anemia, unspecified: Secondary | ICD-10-CM | POA: Diagnosis not present

## 2017-10-27 DIAGNOSIS — C9190 Lymphoid leukemia, unspecified not having achieved remission: Secondary | ICD-10-CM | POA: Diagnosis not present

## 2017-10-27 DIAGNOSIS — J4540 Moderate persistent asthma, uncomplicated: Secondary | ICD-10-CM | POA: Diagnosis not present

## 2017-10-27 DIAGNOSIS — I5032 Chronic diastolic (congestive) heart failure: Secondary | ICD-10-CM | POA: Diagnosis not present

## 2017-10-27 DIAGNOSIS — R1310 Dysphagia, unspecified: Secondary | ICD-10-CM | POA: Diagnosis not present

## 2017-10-27 DIAGNOSIS — D696 Thrombocytopenia, unspecified: Secondary | ICD-10-CM | POA: Diagnosis not present

## 2017-10-28 DIAGNOSIS — J4540 Moderate persistent asthma, uncomplicated: Secondary | ICD-10-CM | POA: Diagnosis not present

## 2017-10-28 DIAGNOSIS — I5032 Chronic diastolic (congestive) heart failure: Secondary | ICD-10-CM | POA: Diagnosis not present

## 2017-10-28 DIAGNOSIS — D696 Thrombocytopenia, unspecified: Secondary | ICD-10-CM | POA: Diagnosis not present

## 2017-10-28 DIAGNOSIS — C9190 Lymphoid leukemia, unspecified not having achieved remission: Secondary | ICD-10-CM | POA: Diagnosis not present

## 2017-10-28 DIAGNOSIS — R1310 Dysphagia, unspecified: Secondary | ICD-10-CM | POA: Diagnosis not present

## 2017-10-28 DIAGNOSIS — D509 Iron deficiency anemia, unspecified: Secondary | ICD-10-CM | POA: Diagnosis not present

## 2017-11-01 DIAGNOSIS — D696 Thrombocytopenia, unspecified: Secondary | ICD-10-CM | POA: Diagnosis not present

## 2017-11-01 DIAGNOSIS — R1310 Dysphagia, unspecified: Secondary | ICD-10-CM | POA: Diagnosis not present

## 2017-11-01 DIAGNOSIS — D509 Iron deficiency anemia, unspecified: Secondary | ICD-10-CM | POA: Diagnosis not present

## 2017-11-01 DIAGNOSIS — C9190 Lymphoid leukemia, unspecified not having achieved remission: Secondary | ICD-10-CM | POA: Diagnosis not present

## 2017-11-01 DIAGNOSIS — J4540 Moderate persistent asthma, uncomplicated: Secondary | ICD-10-CM | POA: Diagnosis not present

## 2017-11-01 DIAGNOSIS — I5032 Chronic diastolic (congestive) heart failure: Secondary | ICD-10-CM | POA: Diagnosis not present

## 2017-11-04 ENCOUNTER — Inpatient Hospital Stay: Payer: Medicare Other | Attending: Hematology & Oncology | Admitting: Hematology & Oncology

## 2017-11-04 ENCOUNTER — Inpatient Hospital Stay: Payer: Medicare Other

## 2017-11-04 DIAGNOSIS — C9190 Lymphoid leukemia, unspecified not having achieved remission: Secondary | ICD-10-CM | POA: Diagnosis not present

## 2017-11-04 DIAGNOSIS — J4540 Moderate persistent asthma, uncomplicated: Secondary | ICD-10-CM | POA: Diagnosis not present

## 2017-11-04 DIAGNOSIS — D696 Thrombocytopenia, unspecified: Secondary | ICD-10-CM | POA: Diagnosis not present

## 2017-11-04 DIAGNOSIS — I5032 Chronic diastolic (congestive) heart failure: Secondary | ICD-10-CM | POA: Diagnosis not present

## 2017-11-04 DIAGNOSIS — R1310 Dysphagia, unspecified: Secondary | ICD-10-CM | POA: Diagnosis not present

## 2017-11-04 DIAGNOSIS — D509 Iron deficiency anemia, unspecified: Secondary | ICD-10-CM | POA: Diagnosis not present

## 2017-11-05 ENCOUNTER — Telehealth: Payer: Self-pay | Admitting: Hematology & Oncology

## 2017-11-05 NOTE — Telephone Encounter (Signed)
lvm on patient son phone re rescheduling patients missed appointment per voicemail.

## 2017-11-08 DIAGNOSIS — J4540 Moderate persistent asthma, uncomplicated: Secondary | ICD-10-CM | POA: Diagnosis not present

## 2017-11-08 DIAGNOSIS — I5032 Chronic diastolic (congestive) heart failure: Secondary | ICD-10-CM | POA: Diagnosis not present

## 2017-11-08 DIAGNOSIS — C9190 Lymphoid leukemia, unspecified not having achieved remission: Secondary | ICD-10-CM | POA: Diagnosis not present

## 2017-11-08 DIAGNOSIS — R1310 Dysphagia, unspecified: Secondary | ICD-10-CM | POA: Diagnosis not present

## 2017-11-08 DIAGNOSIS — D509 Iron deficiency anemia, unspecified: Secondary | ICD-10-CM | POA: Diagnosis not present

## 2017-11-08 DIAGNOSIS — D696 Thrombocytopenia, unspecified: Secondary | ICD-10-CM | POA: Diagnosis not present

## 2017-11-10 DIAGNOSIS — R1310 Dysphagia, unspecified: Secondary | ICD-10-CM | POA: Diagnosis not present

## 2017-11-10 DIAGNOSIS — D509 Iron deficiency anemia, unspecified: Secondary | ICD-10-CM | POA: Diagnosis not present

## 2017-11-10 DIAGNOSIS — I5032 Chronic diastolic (congestive) heart failure: Secondary | ICD-10-CM | POA: Diagnosis not present

## 2017-11-10 DIAGNOSIS — D696 Thrombocytopenia, unspecified: Secondary | ICD-10-CM | POA: Diagnosis not present

## 2017-11-10 DIAGNOSIS — C9190 Lymphoid leukemia, unspecified not having achieved remission: Secondary | ICD-10-CM | POA: Diagnosis not present

## 2017-11-10 DIAGNOSIS — J4540 Moderate persistent asthma, uncomplicated: Secondary | ICD-10-CM | POA: Diagnosis not present

## 2017-11-11 DIAGNOSIS — C9190 Lymphoid leukemia, unspecified not having achieved remission: Secondary | ICD-10-CM | POA: Diagnosis not present

## 2017-11-11 DIAGNOSIS — D509 Iron deficiency anemia, unspecified: Secondary | ICD-10-CM | POA: Diagnosis not present

## 2017-11-11 DIAGNOSIS — D696 Thrombocytopenia, unspecified: Secondary | ICD-10-CM | POA: Diagnosis not present

## 2017-11-11 DIAGNOSIS — R1310 Dysphagia, unspecified: Secondary | ICD-10-CM | POA: Diagnosis not present

## 2017-11-11 DIAGNOSIS — I5032 Chronic diastolic (congestive) heart failure: Secondary | ICD-10-CM | POA: Diagnosis not present

## 2017-11-11 DIAGNOSIS — J4540 Moderate persistent asthma, uncomplicated: Secondary | ICD-10-CM | POA: Diagnosis not present

## 2017-11-16 DIAGNOSIS — D696 Thrombocytopenia, unspecified: Secondary | ICD-10-CM | POA: Diagnosis not present

## 2017-11-16 DIAGNOSIS — D509 Iron deficiency anemia, unspecified: Secondary | ICD-10-CM | POA: Diagnosis not present

## 2017-11-16 DIAGNOSIS — J4540 Moderate persistent asthma, uncomplicated: Secondary | ICD-10-CM | POA: Diagnosis not present

## 2017-11-16 DIAGNOSIS — I5032 Chronic diastolic (congestive) heart failure: Secondary | ICD-10-CM | POA: Diagnosis not present

## 2017-11-16 DIAGNOSIS — R1310 Dysphagia, unspecified: Secondary | ICD-10-CM | POA: Diagnosis not present

## 2017-11-16 DIAGNOSIS — C9190 Lymphoid leukemia, unspecified not having achieved remission: Secondary | ICD-10-CM | POA: Diagnosis not present

## 2017-11-18 DIAGNOSIS — D509 Iron deficiency anemia, unspecified: Secondary | ICD-10-CM | POA: Diagnosis not present

## 2017-11-18 DIAGNOSIS — C9190 Lymphoid leukemia, unspecified not having achieved remission: Secondary | ICD-10-CM | POA: Diagnosis not present

## 2017-11-18 DIAGNOSIS — I5032 Chronic diastolic (congestive) heart failure: Secondary | ICD-10-CM | POA: Diagnosis not present

## 2017-11-18 DIAGNOSIS — J4540 Moderate persistent asthma, uncomplicated: Secondary | ICD-10-CM | POA: Diagnosis not present

## 2017-11-18 DIAGNOSIS — D696 Thrombocytopenia, unspecified: Secondary | ICD-10-CM | POA: Diagnosis not present

## 2017-11-18 DIAGNOSIS — R1310 Dysphagia, unspecified: Secondary | ICD-10-CM | POA: Diagnosis not present

## 2017-11-19 DIAGNOSIS — R1310 Dysphagia, unspecified: Secondary | ICD-10-CM | POA: Diagnosis not present

## 2017-11-19 DIAGNOSIS — D696 Thrombocytopenia, unspecified: Secondary | ICD-10-CM | POA: Diagnosis not present

## 2017-11-19 DIAGNOSIS — J4540 Moderate persistent asthma, uncomplicated: Secondary | ICD-10-CM | POA: Diagnosis not present

## 2017-11-19 DIAGNOSIS — I5032 Chronic diastolic (congestive) heart failure: Secondary | ICD-10-CM | POA: Diagnosis not present

## 2017-11-19 DIAGNOSIS — C9190 Lymphoid leukemia, unspecified not having achieved remission: Secondary | ICD-10-CM | POA: Diagnosis not present

## 2017-11-19 DIAGNOSIS — D509 Iron deficiency anemia, unspecified: Secondary | ICD-10-CM | POA: Diagnosis not present

## 2017-11-24 DIAGNOSIS — I5032 Chronic diastolic (congestive) heart failure: Secondary | ICD-10-CM | POA: Diagnosis not present

## 2017-11-24 DIAGNOSIS — D509 Iron deficiency anemia, unspecified: Secondary | ICD-10-CM | POA: Diagnosis not present

## 2017-11-24 DIAGNOSIS — J4540 Moderate persistent asthma, uncomplicated: Secondary | ICD-10-CM | POA: Diagnosis not present

## 2017-11-24 DIAGNOSIS — C9190 Lymphoid leukemia, unspecified not having achieved remission: Secondary | ICD-10-CM | POA: Diagnosis not present

## 2017-11-24 DIAGNOSIS — R1310 Dysphagia, unspecified: Secondary | ICD-10-CM | POA: Diagnosis not present

## 2017-11-24 DIAGNOSIS — D696 Thrombocytopenia, unspecified: Secondary | ICD-10-CM | POA: Diagnosis not present

## 2017-11-25 DIAGNOSIS — R1310 Dysphagia, unspecified: Secondary | ICD-10-CM | POA: Diagnosis not present

## 2017-11-25 DIAGNOSIS — J4540 Moderate persistent asthma, uncomplicated: Secondary | ICD-10-CM | POA: Diagnosis not present

## 2017-11-25 DIAGNOSIS — D696 Thrombocytopenia, unspecified: Secondary | ICD-10-CM | POA: Diagnosis not present

## 2017-11-25 DIAGNOSIS — I5032 Chronic diastolic (congestive) heart failure: Secondary | ICD-10-CM | POA: Diagnosis not present

## 2017-11-25 DIAGNOSIS — C9190 Lymphoid leukemia, unspecified not having achieved remission: Secondary | ICD-10-CM | POA: Diagnosis not present

## 2017-11-25 DIAGNOSIS — D509 Iron deficiency anemia, unspecified: Secondary | ICD-10-CM | POA: Diagnosis not present

## 2017-12-01 DIAGNOSIS — I5032 Chronic diastolic (congestive) heart failure: Secondary | ICD-10-CM | POA: Diagnosis not present

## 2017-12-01 DIAGNOSIS — D696 Thrombocytopenia, unspecified: Secondary | ICD-10-CM | POA: Diagnosis not present

## 2017-12-01 DIAGNOSIS — C9190 Lymphoid leukemia, unspecified not having achieved remission: Secondary | ICD-10-CM | POA: Diagnosis not present

## 2017-12-01 DIAGNOSIS — R1310 Dysphagia, unspecified: Secondary | ICD-10-CM | POA: Diagnosis not present

## 2017-12-01 DIAGNOSIS — J4540 Moderate persistent asthma, uncomplicated: Secondary | ICD-10-CM | POA: Diagnosis not present

## 2017-12-01 DIAGNOSIS — D509 Iron deficiency anemia, unspecified: Secondary | ICD-10-CM | POA: Diagnosis not present

## 2017-12-02 ENCOUNTER — Other Ambulatory Visit: Payer: Self-pay | Admitting: Family Medicine

## 2017-12-03 DIAGNOSIS — R1310 Dysphagia, unspecified: Secondary | ICD-10-CM | POA: Diagnosis not present

## 2017-12-03 DIAGNOSIS — H538 Other visual disturbances: Secondary | ICD-10-CM | POA: Diagnosis not present

## 2017-12-03 DIAGNOSIS — I5032 Chronic diastolic (congestive) heart failure: Secondary | ICD-10-CM | POA: Diagnosis not present

## 2017-12-03 DIAGNOSIS — D509 Iron deficiency anemia, unspecified: Secondary | ICD-10-CM | POA: Diagnosis not present

## 2017-12-03 DIAGNOSIS — K219 Gastro-esophageal reflux disease without esophagitis: Secondary | ICD-10-CM | POA: Diagnosis not present

## 2017-12-03 DIAGNOSIS — J4540 Moderate persistent asthma, uncomplicated: Secondary | ICD-10-CM | POA: Diagnosis not present

## 2017-12-03 DIAGNOSIS — R42 Dizziness and giddiness: Secondary | ICD-10-CM | POA: Diagnosis not present

## 2017-12-03 DIAGNOSIS — C9190 Lymphoid leukemia, unspecified not having achieved remission: Secondary | ICD-10-CM | POA: Diagnosis not present

## 2017-12-03 DIAGNOSIS — E876 Hypokalemia: Secondary | ICD-10-CM | POA: Diagnosis not present

## 2017-12-03 DIAGNOSIS — J449 Chronic obstructive pulmonary disease, unspecified: Secondary | ICD-10-CM | POA: Diagnosis not present

## 2017-12-03 DIAGNOSIS — D696 Thrombocytopenia, unspecified: Secondary | ICD-10-CM | POA: Diagnosis not present

## 2017-12-06 ENCOUNTER — Other Ambulatory Visit: Payer: Medicare Other

## 2017-12-06 ENCOUNTER — Telehealth: Payer: Self-pay | Admitting: Pharmacist

## 2017-12-06 ENCOUNTER — Ambulatory Visit: Payer: Medicare Other | Admitting: Hematology & Oncology

## 2017-12-06 ENCOUNTER — Encounter: Payer: Self-pay | Admitting: Pharmacist

## 2017-12-06 NOTE — Telephone Encounter (Signed)
Oral Chemotherapy Pharmacist Encounter  Follow-Up Form  Received a call from [place] stated that the son called to let them know that his mom has started her Imbruvica and today is Day 51 for her. Called Patient's son Maurene Capes to see how she was doing with the Imbruvica.   Original Start date of oral chemotherapy: 11/18/17  Pt reports 0 tablets/doses of Imbruvica missed since starting  Pt reports the following side effects: diarrhea, they have been able to manage this. Reviewed additional management techniques.   New medications?: None reported  Other Issues: swelling of feet and ankles, per her son, the dose of her lasix has been altered.  Patient knows to call the office with questions or concerns. Oral Oncology Clinic will continue to follow.  Darl Pikes, PharmD, BCPS Hematology/Oncology Clinical Pharmacist [facility] [phone]  12/06/2017 12:14 PM

## 2017-12-06 NOTE — Telephone Encounter (Signed)
Erroneous Encounter

## 2017-12-08 DIAGNOSIS — D696 Thrombocytopenia, unspecified: Secondary | ICD-10-CM | POA: Diagnosis not present

## 2017-12-08 DIAGNOSIS — R1310 Dysphagia, unspecified: Secondary | ICD-10-CM | POA: Diagnosis not present

## 2017-12-08 DIAGNOSIS — D509 Iron deficiency anemia, unspecified: Secondary | ICD-10-CM | POA: Diagnosis not present

## 2017-12-08 DIAGNOSIS — C9190 Lymphoid leukemia, unspecified not having achieved remission: Secondary | ICD-10-CM | POA: Diagnosis not present

## 2017-12-08 DIAGNOSIS — I5032 Chronic diastolic (congestive) heart failure: Secondary | ICD-10-CM | POA: Diagnosis not present

## 2017-12-08 DIAGNOSIS — J4540 Moderate persistent asthma, uncomplicated: Secondary | ICD-10-CM | POA: Diagnosis not present

## 2017-12-10 ENCOUNTER — Other Ambulatory Visit: Payer: Self-pay | Admitting: Hematology & Oncology

## 2017-12-10 DIAGNOSIS — J4540 Moderate persistent asthma, uncomplicated: Secondary | ICD-10-CM | POA: Diagnosis not present

## 2017-12-10 DIAGNOSIS — D509 Iron deficiency anemia, unspecified: Secondary | ICD-10-CM | POA: Diagnosis not present

## 2017-12-10 DIAGNOSIS — I5032 Chronic diastolic (congestive) heart failure: Secondary | ICD-10-CM | POA: Diagnosis not present

## 2017-12-10 DIAGNOSIS — C9190 Lymphoid leukemia, unspecified not having achieved remission: Secondary | ICD-10-CM | POA: Diagnosis not present

## 2017-12-10 DIAGNOSIS — R1310 Dysphagia, unspecified: Secondary | ICD-10-CM | POA: Diagnosis not present

## 2017-12-10 DIAGNOSIS — D696 Thrombocytopenia, unspecified: Secondary | ICD-10-CM | POA: Diagnosis not present

## 2017-12-13 DIAGNOSIS — D509 Iron deficiency anemia, unspecified: Secondary | ICD-10-CM | POA: Diagnosis not present

## 2017-12-13 DIAGNOSIS — R1310 Dysphagia, unspecified: Secondary | ICD-10-CM | POA: Diagnosis not present

## 2017-12-13 DIAGNOSIS — I5032 Chronic diastolic (congestive) heart failure: Secondary | ICD-10-CM | POA: Diagnosis not present

## 2017-12-13 DIAGNOSIS — J4540 Moderate persistent asthma, uncomplicated: Secondary | ICD-10-CM | POA: Diagnosis not present

## 2017-12-13 DIAGNOSIS — D696 Thrombocytopenia, unspecified: Secondary | ICD-10-CM | POA: Diagnosis not present

## 2017-12-13 DIAGNOSIS — C9190 Lymphoid leukemia, unspecified not having achieved remission: Secondary | ICD-10-CM | POA: Diagnosis not present

## 2017-12-13 MED FILL — IMBRUVICA 280 MG TAB: 280 | 28 days supply | Qty: 28 | Fill #1

## 2017-12-14 ENCOUNTER — Inpatient Hospital Stay (HOSPITAL_BASED_OUTPATIENT_CLINIC_OR_DEPARTMENT_OTHER): Payer: Medicare Other | Admitting: Hematology & Oncology

## 2017-12-14 ENCOUNTER — Other Ambulatory Visit: Payer: Self-pay | Admitting: *Deleted

## 2017-12-14 ENCOUNTER — Other Ambulatory Visit: Payer: Self-pay

## 2017-12-14 ENCOUNTER — Telehealth: Payer: Self-pay | Admitting: *Deleted

## 2017-12-14 ENCOUNTER — Inpatient Hospital Stay: Payer: Medicare Other | Attending: Hematology & Oncology

## 2017-12-14 VITALS — BP 112/54 | HR 94 | Temp 98.3°F | Resp 20 | Wt 124.8 lb

## 2017-12-14 DIAGNOSIS — Z79899 Other long term (current) drug therapy: Secondary | ICD-10-CM | POA: Diagnosis not present

## 2017-12-14 DIAGNOSIS — Z7901 Long term (current) use of anticoagulants: Secondary | ICD-10-CM | POA: Diagnosis not present

## 2017-12-14 DIAGNOSIS — R0602 Shortness of breath: Secondary | ICD-10-CM | POA: Insufficient documentation

## 2017-12-14 DIAGNOSIS — R5383 Other fatigue: Secondary | ICD-10-CM | POA: Diagnosis not present

## 2017-12-14 DIAGNOSIS — R2231 Localized swelling, mass and lump, right upper limb: Secondary | ICD-10-CM

## 2017-12-14 DIAGNOSIS — R161 Splenomegaly, not elsewhere classified: Secondary | ICD-10-CM

## 2017-12-14 DIAGNOSIS — D509 Iron deficiency anemia, unspecified: Secondary | ICD-10-CM | POA: Diagnosis not present

## 2017-12-14 DIAGNOSIS — Z923 Personal history of irradiation: Secondary | ICD-10-CM | POA: Insufficient documentation

## 2017-12-14 DIAGNOSIS — C9110 Chronic lymphocytic leukemia of B-cell type not having achieved remission: Secondary | ICD-10-CM

## 2017-12-14 DIAGNOSIS — R002 Palpitations: Secondary | ICD-10-CM | POA: Diagnosis not present

## 2017-12-14 DIAGNOSIS — R3915 Urgency of urination: Secondary | ICD-10-CM | POA: Diagnosis not present

## 2017-12-14 DIAGNOSIS — R5381 Other malaise: Secondary | ICD-10-CM

## 2017-12-14 DIAGNOSIS — R531 Weakness: Secondary | ICD-10-CM

## 2017-12-14 DIAGNOSIS — R110 Nausea: Secondary | ICD-10-CM

## 2017-12-14 LAB — CBC WITH DIFFERENTIAL (CANCER CENTER ONLY)
Basophils Absolute: 0.1 10*3/uL (ref 0.0–0.1)
Basophils Relative: 0 %
Eosinophils Absolute: 0.1 10*3/uL (ref 0.0–0.5)
Eosinophils Relative: 0 %
HEMATOCRIT: 32.6 % — AB (ref 34.8–46.6)
HEMOGLOBIN: 9.7 g/dL — AB (ref 11.6–15.9)
LYMPHS ABS: 92.7 10*3/uL — AB (ref 0.9–3.3)
LYMPHS PCT: 94 %
MCH: 21.7 pg — AB (ref 26.0–34.0)
MCHC: 29.8 g/dL — AB (ref 32.0–36.0)
MCV: 73.1 fL — AB (ref 81.0–101.0)
MONOS PCT: 1 %
Monocytes Absolute: 0.9 10*3/uL (ref 0.1–0.9)
NEUTROS ABS: 5.2 10*3/uL (ref 1.5–6.5)
NEUTROS PCT: 5 %
Platelet Count: 145 10*3/uL (ref 145–400)
RBC: 4.46 MIL/uL (ref 3.70–5.32)
RDW: 15.4 % (ref 11.1–15.7)
WBC: 99.1 10*3/uL — AB (ref 3.9–10.0)

## 2017-12-14 LAB — CMP (CANCER CENTER ONLY)
ALT: 19 U/L (ref 10–47)
ANION GAP: 8 (ref 5–15)
AST: 18 U/L (ref 11–38)
Albumin: 3.1 g/dL — ABNORMAL LOW (ref 3.5–5.0)
Alkaline Phosphatase: 73 U/L (ref 26–84)
BUN: 12 mg/dL (ref 7–22)
CALCIUM: 8.9 mg/dL (ref 8.0–10.3)
CO2: 29 mmol/L (ref 18–33)
Chloride: 97 mmol/L — ABNORMAL LOW (ref 98–108)
Creatinine: 0.8 mg/dL (ref 0.60–1.20)
GLUCOSE: 130 mg/dL — AB (ref 73–118)
Potassium: 3.6 mmol/L (ref 3.3–4.7)
SODIUM: 134 mmol/L (ref 128–145)
Total Bilirubin: 0.7 mg/dL (ref 0.2–1.6)
Total Protein: 5.5 g/dL — ABNORMAL LOW (ref 6.4–8.1)

## 2017-12-14 LAB — LACTATE DEHYDROGENASE: LDH: 207 U/L (ref 125–245)

## 2017-12-14 MED ORDER — IBRUTINIB 280 MG PO TABS: 280.0000 mg | ORAL_TABLET | Freq: Two times a day (BID) | ORAL | 4 refills | Status: AC

## 2017-12-14 MED ORDER — NEOMYCIN-POLYMYXIN-HC 3.5-10000-1 OT SOLN: 3.0000 [drp] | Freq: Three times a day (TID) | OTIC | 2 refills | Status: AC

## 2017-12-14 NOTE — Progress Notes (Signed)
Hematology and Oncology Follow Up Visit  Stefanie Henry [id number] Feb 06, 1930 84 y.o. 12/14/2017   Principle Diagnosis:  Chronic lymphocytic leukemia - observation only, did not tolerate treatment and discontinued Iron deficiency anemia Mass in the right forearm  Current Therapy:   Status post radiation therapy to right forearm mass-poor response to date.  Ibrutinib 560 mg p.o. daily-start on 12/14/2017    Interim History:  Stefanie Henry is here today with her son and daughter for a follow-up.  She has been on the [facility] now for about 3-4 weeks.  Her white cell count is actually higher.  This would not be all that surprising.  Her right arm is still swollen.  I am still absolutely baffled that this mass has not responded to treatment.  Is not responded to radiation.  I will call radiation oncology again and see if they might be able to give more radiation to that arm.  She has tolerated the IMBRUVICA pretty well from my point of view.  She is had no nausea or vomiting.  She is had no rashes.  She is had no fever.  She is had no bleeding.  Overall, her performance status is ECOG 3.   Medications:  Allergies as of 12/14/2017      Reactions   Ciprofloxacin    Citrus Other (See Comments)   Stomach discomfort   Other    Cannot take any antibiotics, causes rash   Augmentin [amoxicillin-pot Clavulanate] Swelling, Rash   Has patient had a PCN reaction causing immediate rash, facial/tongue/throat swelling, SOB or lightheadedness with hypotension: Unknown Has patient had a PCN reaction causing severe rash involving mucus membranes or skin necrosis: Unknown Has patient had a PCN reaction that required hospitalization: Unknown Has patient had a PCN reaction occurring within the last 10 years: Unknown If all of the above answers are "NO", then may proceed with Cephalosporin use.   Ibuprofen Other (See Comments)   Stomach discomfort   Keflex [cephalexin] Swelling, Rash        Medication List        Accurate as of 12/14/17 12:45 PM. Always use your most recent med list.          albuterol (2.5 MG/3ML) 0.083% nebulizer solution Commonly known as:  PROVENTIL Take 3 mLs (2.5 mg total) by nebulization 2 (two) times daily.   albuterol 108 (90 Base) MCG/ACT inhaler Commonly known as:  VENTOLIN HFA INHALE 1-2 PUFFS INTO THE LUNGS EVERY 6 HOURS AS NEEDED FOR WHEEZING OR SHORTNESS OF BREATH.   calcitonin (salmon) 200 UNIT/ACT nasal spray Commonly known as:  MIACALCIN/FORTICAL Place 1 spray into alternate nostrils daily.   DULERA 200-5 MCG/ACT Aero Generic drug:  mometasone-formoterol INHALE 2 PUFFS INTO THE LUNGS 2 (TWO) TIMES DAILY.   ELIQUIS 5 MG Tabs tablet Generic drug:  apixaban Take 5 mg by mouth 2 (two) times daily.   gabapentin 100 MG capsule Commonly known as:  NEURONTIN TAKE 2 CAPSULES BY MOUTH 3 TIMES A DAY AS NEEDED FOR NERVE PAIN   GARLIC PO Take 1 tablet by mouth daily.   GAS-X PO Take 2 capsules by mouth daily as needed (for gas).   HYDROcodone-acetaminophen 5-325 MG tablet Commonly known as:  NORCO Take 1-2 tablets by mouth every 6 (six) hours as needed for moderate pain.   hyoscyamine 0.125 MG SL tablet Commonly known as:  LEVSIN SL PLACE 1 TABLET (0.125 MG TOTAL) UNDER THE TONGUE EVERY 8 (EIGHT) HOURS AS NEEDED.   Ibrutinib 280 MG Tabs  Take 280 mg by mouth daily after breakfast.   meloxicam 7.5 MG tablet Commonly known as:  MOBIC Take 1 tablet (7.5 mg total) by mouth daily.   multivitamin with minerals Tabs tablet Take 1 tablet by mouth daily.   omeprazole 20 MG capsule Commonly known as:  PRILOSEC TAKE ONE CAPSULE BY MOUTH EVERY DAY   polyethylene glycol powder powder Commonly known as:  MIRALAX Take 17 g by mouth daily.   potassium chloride 10 MEQ tablet Commonly known as:  KLOR-CON 10 TAKE 1 TAB DAILY WITH TORSEMIDE.   predniSONE 10 MG tablet Commonly known as:  DELTASONE TAKE 1 TABLET BY MOUTH EVERY DAY    psyllium 58.6 % packet Commonly known as:  METAMUCIL Take 1 packet by mouth daily.   senna 8.6 MG Tabs tablet Commonly known as:  SENOKOT Take 1-2 tablets by mouth daily as needed for mild constipation.   sucralfate 1 g tablet Commonly known as:  CARAFATE TAKE 2 TABLETS IN THE MORNING AND AT BEDTIME   torsemide 20 MG tablet Commonly known as:  DEMADEX Take 1 tablet (20 mg total) by mouth daily.   traMADol 50 MG tablet Commonly known as:  ULTRAM Take 1 tablet (50 mg total) by mouth every 8 (eight) hours as needed (Pain).   vitamin B-12 500 MCG tablet Commonly known as:  CYANOCOBALAMIN Take 500 mcg by mouth daily.   vitamin C 1000 MG tablet Take 1,000 mg by mouth daily.       Allergies:  Allergies  Allergen Reactions  . Ciprofloxacin   . Citrus Other (See Comments)    Stomach discomfort  . Other     Cannot take any antibiotics, causes rash  . Augmentin [Amoxicillin-Pot Clavulanate] Swelling and Rash    Has patient had a PCN reaction causing immediate rash, facial/tongue/throat swelling, SOB or lightheadedness with hypotension: Unknown Has patient had a PCN reaction causing severe rash involving mucus membranes or skin necrosis: Unknown Has patient had a PCN reaction that required hospitalization: Unknown Has patient had a PCN reaction occurring within the last 10 years: Unknown If all of the above answers are "NO", then may proceed with Cephalosporin use.   . Ibuprofen Other (See Comments)    Stomach discomfort  . Keflex [Cephalexin] Swelling and Rash    Past Medical History, Surgical history, Social history, and Family History were reviewed and updated.  Review of Systems: Review of Systems  Constitutional: Positive for malaise/fatigue.  HENT: Positive for sore throat.   Eyes: Negative.   Respiratory: Positive for shortness of breath.   Cardiovascular: Positive for palpitations and leg swelling.  Gastrointestinal: Positive for nausea.  Genitourinary:  Positive for urgency.  Musculoskeletal: Positive for joint pain.  Skin: Negative.   Neurological: Positive for focal weakness.  Endo/Heme/Allergies: Negative.   Psychiatric/Behavioral: Negative.       Physical Exam:  weight is 124 lb 12 oz (56.6 kg). Her oral temperature is 98.3 F (36.8 C). Her blood pressure is 112/54 (abnormal) and her pulse is 94. Her respiration is 20 and oxygen saturation is 99%.   Wt Readings from Last 3 Encounters:  12/14/17 124 lb 12 oz (56.6 kg)  08/31/17 130 lb (59 kg)  07/08/17 130 lb (59 kg)    Physical Exam  Constitutional: She is oriented to person, place, and time.  HENT:  Head: Normocephalic and atraumatic.  Mouth/Throat: Oropharynx is clear and moist.  Eyes: Pupils are equal, round, and reactive to light. EOM are normal.  Neck: Normal range  of motion.  Cardiovascular: Normal rate, regular rhythm and normal heart sounds.  Pulmonary/Chest: Effort normal and breath sounds normal.  Abdominal: Soft. Bowel sounds are normal.  Musculoskeletal: Normal range of motion. She exhibits no edema, tenderness or deformity.  She has moderate swelling of the right arm.  She has tenderness in the right forearm.  She has decreased range of motion of her right index finger.  Lymphadenopathy:    She has no cervical adenopathy.  Neurological: She is alert and oriented to person, place, and time.  Skin: Skin is warm and dry. No rash noted. No erythema.  Psychiatric: She has a normal mood and affect. Her behavior is normal. Judgment and thought content normal.  Vitals reviewed.   Lab Results  Component Value Date   WBC 99.1 (HH) 12/14/2017   HGB 9.7 (L) 12/14/2017   HCT 32.6 (L) 12/14/2017   MCV 73.1 (L) 12/14/2017   PLT 145 12/14/2017   Lab Results  Component Value Date   FERRITIN 301 (H) 07/12/2017   IRON 26 (L) 07/12/2017   TIBC 243 07/12/2017   UIBC 217 07/12/2017   IRONPCTSAT 11 (L) 07/12/2017   Lab Results  Component Value Date   RBC 4.46  12/14/2017   No results found for: KPAFRELGTCHN, LAMBDASER, [facility] Lab Results  Component Value Date   IGGSERUM 148 (L) 08/31/2017   IGA 39 (L) 08/31/2017   IGMSERUM <5 (L) 08/31/2017   Lab Results  Component Value Date   TOTALPROTELP 5.7 (L) 06/06/2012   ALBUMINELP 66.8 (H) 06/06/2012   A1GS 5.0 (H) 06/06/2012   A2GS 14.4 (H) 06/06/2012   BETS 7.0 06/06/2012   BETA2SER 3.3 06/06/2012   GAMS 3.5 (L) 06/06/2012   MSPIKE NOT DET 06/06/2012   SPEI * 06/06/2012     Chemistry      Component Value Date/Time   NA 134 12/14/2017 1122   NA 132 10/29/2016 1218   NA 135 (L) 03/29/2015 0908   K 3.6 12/14/2017 1122   K 3.5 10/29/2016 1218   K 3.5 03/29/2015 0908   CL 97 (L) 12/14/2017 1122   CL 94 (L) 10/29/2016 1218   CO2 29 12/14/2017 1122   CO2 27 10/29/2016 1218   CO2 25 03/29/2015 0908   BUN 12 12/14/2017 1122   BUN 4 (L) 10/29/2016 1218   BUN 7.9 03/29/2015 0908   CREATININE 0.80 12/14/2017 1122   CREATININE 0.8 10/29/2016 1218   CREATININE 0.7 03/29/2015 0908      Component Value Date/Time   CALCIUM 8.9 12/14/2017 1122   CALCIUM 9.0 10/29/2016 1218   CALCIUM 8.6 03/29/2015 0908   ALKPHOS 73 12/14/2017 1122   ALKPHOS 94 (H) 10/29/2016 1218   ALKPHOS 63 03/29/2015 0908   AST 18 12/14/2017 1122   AST 10 03/29/2015 0908   ALT 19 12/14/2017 1122   ALT 16 10/29/2016 1218   ALT 11 03/29/2015 0908   BILITOT 0.7 12/14/2017 1122   BILITOT 0.44 03/29/2015 0908     Impression and Plan: Stefanie Henry is a very pleasant 84 yo [place] female with history of CLL and iron deficiency anemia.  Hopefully, we will start to see her white cell count start coming down next time we see her.  I am sure she still has significant splenomegaly.  She has low immunoglobulin levels.  This would definitely increase her risk of infection.  I will see her back in another 3 or 4 weeks.  Her family is doing a great job with her.  I know they are doing their best to try to help  her. [name], MD 6/18/201912:45 PM

## 2017-12-14 NOTE — Telephone Encounter (Signed)
Critical Value WBC 99.1 Dr [name] notified. No orders at this time.

## 2018-01-13 ENCOUNTER — Inpatient Hospital Stay: Payer: Medicare Other | Attending: Hematology & Oncology

## 2018-01-13 ENCOUNTER — Inpatient Hospital Stay (HOSPITAL_BASED_OUTPATIENT_CLINIC_OR_DEPARTMENT_OTHER): Payer: Medicare Other | Admitting: Hematology & Oncology

## 2018-01-13 ENCOUNTER — Other Ambulatory Visit: Payer: Self-pay

## 2018-01-13 ENCOUNTER — Other Ambulatory Visit: Payer: Self-pay | Admitting: Family

## 2018-01-13 ENCOUNTER — Encounter: Payer: Self-pay | Admitting: Hematology & Oncology

## 2018-01-13 DIAGNOSIS — R0602 Shortness of breath: Secondary | ICD-10-CM | POA: Insufficient documentation

## 2018-01-13 DIAGNOSIS — R531 Weakness: Secondary | ICD-10-CM

## 2018-01-13 DIAGNOSIS — R002 Palpitations: Secondary | ICD-10-CM | POA: Diagnosis not present

## 2018-01-13 DIAGNOSIS — Z79899 Other long term (current) drug therapy: Secondary | ICD-10-CM | POA: Insufficient documentation

## 2018-01-13 DIAGNOSIS — R5383 Other fatigue: Secondary | ICD-10-CM | POA: Insufficient documentation

## 2018-01-13 DIAGNOSIS — R220 Localized swelling, mass and lump, head: Secondary | ICD-10-CM

## 2018-01-13 DIAGNOSIS — Z7952 Long term (current) use of systemic steroids: Secondary | ICD-10-CM

## 2018-01-13 DIAGNOSIS — D509 Iron deficiency anemia, unspecified: Secondary | ICD-10-CM | POA: Diagnosis not present

## 2018-01-13 DIAGNOSIS — Z923 Personal history of irradiation: Secondary | ICD-10-CM | POA: Insufficient documentation

## 2018-01-13 DIAGNOSIS — M25531 Pain in right wrist: Secondary | ICD-10-CM

## 2018-01-13 DIAGNOSIS — M79631 Pain in right forearm: Secondary | ICD-10-CM | POA: Insufficient documentation

## 2018-01-13 DIAGNOSIS — R5381 Other malaise: Secondary | ICD-10-CM | POA: Diagnosis not present

## 2018-01-13 DIAGNOSIS — C9110 Chronic lymphocytic leukemia of B-cell type not having achieved remission: Secondary | ICD-10-CM | POA: Diagnosis not present

## 2018-01-13 DIAGNOSIS — R2231 Localized swelling, mass and lump, right upper limb: Secondary | ICD-10-CM | POA: Insufficient documentation

## 2018-01-13 DIAGNOSIS — R600 Localized edema: Secondary | ICD-10-CM | POA: Diagnosis not present

## 2018-01-13 LAB — CBC WITH DIFFERENTIAL (CANCER CENTER ONLY)
BAND NEUTROPHILS: 0 %
BASOS PCT: 0 %
Basophils Absolute: 0 10*3/uL (ref 0.0–0.1)
Blasts: 0 %
EOS ABS: 0 10*3/uL (ref 0.0–0.5)
EOS PCT: 0 %
HCT: 31.7 % — ABNORMAL LOW (ref 34.8–46.6)
HEMOGLOBIN: 9.8 g/dL — AB (ref 11.6–15.9)
LYMPHS PCT: 85 %
Lymphs Abs: 38.7 10*3/uL — ABNORMAL HIGH (ref 0.9–3.3)
MCH: 22 pg — ABNORMAL LOW (ref 26.0–34.0)
MCHC: 30.9 g/dL — AB (ref 32.0–36.0)
MCV: 71.2 fL — ABNORMAL LOW (ref 81.0–101.0)
MONO ABS: 2.3 10*3/uL — AB (ref 0.1–0.9)
Metamyelocytes Relative: 0 %
Monocytes Relative: 5 %
Myelocytes: 0 %
Neutro Abs: 4.6 10*3/uL (ref 1.5–6.5)
Neutrophils Relative %: 10 %
OTHER: 0 %
PLATELETS: 176 10*3/uL (ref 145–400)
PROMYELOCYTES RELATIVE: 0 %
RBC: 4.45 MIL/uL (ref 3.70–5.32)
RDW: 14.8 % (ref 11.1–15.7)
WBC Count: 45.6 10*3/uL — ABNORMAL HIGH (ref 3.9–10.0)
nRBC: 0 /100 WBC

## 2018-01-13 LAB — CMP (CANCER CENTER ONLY)
ALT: 14 U/L (ref 0–44)
ANION GAP: 7 (ref 5–15)
AST: 16 U/L (ref 15–41)
Albumin: 3.7 g/dL (ref 3.5–5.0)
Alkaline Phosphatase: 78 U/L (ref 38–126)
BUN: 9 mg/dL (ref 8–23)
CHLORIDE: 96 mmol/L — AB (ref 98–111)
CO2: 28 mmol/L (ref 22–32)
Calcium: 8.8 mg/dL — ABNORMAL LOW (ref 8.9–10.3)
Creatinine: 0.66 mg/dL (ref 0.44–1.00)
Glucose, Bld: 125 mg/dL — ABNORMAL HIGH (ref 70–99)
Potassium: 4 mmol/L (ref 3.5–5.1)
SODIUM: 131 mmol/L — AB (ref 135–145)
TOTAL PROTEIN: 5.5 g/dL — AB (ref 6.5–8.1)
Total Bilirubin: 0.4 mg/dL (ref 0.3–1.2)

## 2018-01-13 LAB — SAVE SMEAR

## 2018-01-13 LAB — SAMPLE TO BLOOD BANK

## 2018-01-13 LAB — LACTATE DEHYDROGENASE: LDH: 173 U/L (ref 98–192)

## 2018-01-13 MED ORDER — TRAMADOL HCL 50 MG PO TABS: 50.0000 mg | ORAL_TABLET | Freq: Three times a day (TID) | ORAL | 0 refills | Status: DC | PRN

## 2018-01-13 NOTE — Progress Notes (Signed)
Hematology and Oncology Follow Up Visit  Stefanie Henry [id number] 07/19/1929 84 y.o. 01/13/2018   Principle Diagnosis:  Chronic lymphocytic leukemia - observation only, did not tolerate treatment and discontinued Iron deficiency anemia Mass in the right forearm  Current Therapy:   Status post radiation therapy to right forearm mass-poor response to date.  Ibrutinib 560 mg p.o. daily-start on 12/14/2017    Interim History:  Stefanie Henry is here today with her son and daughter for a follow-up.  Unfortunately, it looks like the mass in her right forearm is getting larger.  I am very distressed behind this.  We are doing all that we can do that I can think of to try to help with this.  Her white cell count is improving.  Her white cell count has come down by 50% with the IMBRUVICA.  I would think that the mass in the right forearm would also improve.  Unfortunately, we are going to have to do another MRI of her right forearm and see what this mass looks like.  Nothing seems to be affecting it.  I just worried that this actually might be another problem.  We have done 2 biopsies which have shown CLL.  However, something just is worried me that we might be looking at a non-hematologic malignancy.  Even so, I would think that the radiation that she had before would have helped.  I worry that she actually might need to have her right forearm amputated.  She really cannot use that arm anyway.  She is having a lot of pain in the arm.  This is a very sobering situation.  I realize that Stefanie Henry has had issues with respect to coming to the office for quite a few years.  Now, we are seeing her on a regular basis.  I just want to try to help her and help her quality of life.  We will see if the orthopedic surgeon at [facility] will be able to see her again.  I spoke with Dr. Sondra Come of radiation oncology.  He would favor Stefanie Henry be seen by orthopedic surgery first and then trying to re-irradiate  the mass.  She really has had no side effects from the [facility].  She is not eating that much right now because of pain.  I called in some Ultram.  She is only taken Tylenol right now.  She might need something stronger than Tylenol.  Overall, her performance status is ECOG 3.   Medications:  Allergies as of 01/13/2018      Reactions   Ciprofloxacin    Citrus Other (See Comments)   Stomach discomfort   Other    Cannot take any antibiotics, causes rash   Augmentin [amoxicillin-pot Clavulanate] Swelling, Rash   Has patient had a PCN reaction causing immediate rash, facial/tongue/throat swelling, SOB or lightheadedness with hypotension: Unknown Has patient had a PCN reaction causing severe rash involving mucus membranes or skin necrosis: Unknown Has patient had a PCN reaction that required hospitalization: Unknown Has patient had a PCN reaction occurring within the last 10 years: Unknown If all of the above answers are "NO", then may proceed with Cephalosporin use.   Ibuprofen Other (See Comments)   Stomach discomfort   Keflex [cephalexin] Swelling, Rash      Medication List        Accurate as of 01/13/18 11:29 AM. Always use your most recent med list.          albuterol (2.5 MG/3ML) 0.083% nebulizer  solution Commonly known as:  PROVENTIL Take 3 mLs (2.5 mg total) by nebulization 2 (two) times daily.   albuterol 108 (90 Base) MCG/ACT inhaler Commonly known as:  VENTOLIN HFA INHALE 1-2 PUFFS INTO THE LUNGS EVERY 6 HOURS AS NEEDED FOR WHEEZING OR SHORTNESS OF BREATH.   calcitonin (salmon) 200 UNIT/ACT nasal spray Commonly known as:  MIACALCIN/FORTICAL Place 1 spray into alternate nostrils daily.   DULERA 200-5 MCG/ACT Aero Generic drug:  mometasone-formoterol INHALE 2 PUFFS INTO THE LUNGS 2 (TWO) TIMES DAILY.   ELIQUIS 5 MG Tabs tablet Generic drug:  apixaban Take 5 mg by mouth 2 (two) times daily.   gabapentin 100 MG capsule Commonly known as:  NEURONTIN TAKE 2  CAPSULES BY MOUTH 3 TIMES A DAY AS NEEDED FOR NERVE PAIN   GARLIC PO Take 1 tablet by mouth daily.   GAS-X PO Take 2 capsules by mouth daily as needed (for gas).   HYDROcodone-acetaminophen 5-325 MG tablet Commonly known as:  NORCO Take 1-2 tablets by mouth every 6 (six) hours as needed for moderate pain.   hyoscyamine 0.125 MG SL tablet Commonly known as:  LEVSIN SL PLACE 1 TABLET (0.125 MG TOTAL) UNDER THE TONGUE EVERY 8 (EIGHT) HOURS AS NEEDED.   Ibrutinib 280 MG Tabs Take 280 mg by mouth 2 (two) times daily at 10 am and 4 pm.   meloxicam 7.5 MG tablet Commonly known as:  MOBIC Take 1 tablet (7.5 mg total) by mouth daily.   multivitamin with minerals Tabs tablet Take 1 tablet by mouth daily.   neomycin-polymyxin-hydrocortisone OTIC solution Commonly known as:  CORTISPORIN Place 3 drops into the left ear 3 (three) times daily.   omeprazole 20 MG capsule Commonly known as:  PRILOSEC TAKE ONE CAPSULE BY MOUTH EVERY DAY   polyethylene glycol powder powder Commonly known as:  MIRALAX Take 17 g by mouth daily.   potassium chloride 10 MEQ tablet Commonly known as:  KLOR-CON 10 TAKE 1 TAB DAILY WITH TORSEMIDE.   predniSONE 10 MG tablet Commonly known as:  DELTASONE TAKE 1 TABLET BY MOUTH EVERY DAY   psyllium 58.6 % packet Commonly known as:  METAMUCIL Take 1 packet by mouth daily.   senna 8.6 MG Tabs tablet Commonly known as:  SENOKOT Take 1-2 tablets by mouth daily as needed for mild constipation.   sucralfate 1 g tablet Commonly known as:  CARAFATE TAKE 2 TABLETS IN THE MORNING AND AT BEDTIME   torsemide 20 MG tablet Commonly known as:  DEMADEX Take 1 tablet (20 mg total) by mouth daily.   traMADol 50 MG tablet Commonly known as:  ULTRAM Take 1 tablet (50 mg total) by mouth every 8 (eight) hours as needed (Pain).   vitamin B-12 500 MCG tablet Commonly known as:  CYANOCOBALAMIN Take 500 mcg by mouth daily.   vitamin C 1000 MG tablet Take 1,000 mg by  mouth daily.       Allergies:  Allergies  Allergen Reactions  . Ciprofloxacin   . Citrus Other (See Comments)    Stomach discomfort  . Other     Cannot take any antibiotics, causes rash  . Augmentin [Amoxicillin-Pot Clavulanate] Swelling and Rash    Has patient had a PCN reaction causing immediate rash, facial/tongue/throat swelling, SOB or lightheadedness with hypotension: Unknown Has patient had a PCN reaction causing severe rash involving mucus membranes or skin necrosis: Unknown Has patient had a PCN reaction that required hospitalization: Unknown Has patient had a PCN reaction occurring within the  last 10 years: Unknown If all of the above answers are "NO", then may proceed with Cephalosporin use.   . Ibuprofen Other (See Comments)    Stomach discomfort  . Keflex [Cephalexin] Swelling and Rash    Past Medical History, Surgical history, Social history, and Family History were reviewed and updated.  Review of Systems: Review of Systems  Constitutional: Positive for malaise/fatigue.  HENT: Positive for sore throat.   Eyes: Negative.   Respiratory: Positive for shortness of breath.   Cardiovascular: Positive for palpitations and leg swelling.  Gastrointestinal: Positive for nausea.  Genitourinary: Positive for urgency.  Musculoskeletal: Positive for joint pain.  Skin: Negative.   Neurological: Positive for focal weakness.  Endo/Heme/Allergies: Negative.   Psychiatric/Behavioral: Negative.       Physical Exam:  weight is 122 lb (55.3 kg). Her oral temperature is 99 F (37.2 C). Her blood pressure is 112/54 (abnormal) and her pulse is 94. Her respiration is 18 and oxygen saturation is 97%.   Wt Readings from Last 3 Encounters:  01/13/18 122 lb (55.3 kg)  12/14/17 124 lb 12 oz (56.6 kg)  08/31/17 130 lb (59 kg)    Physical Exam  Constitutional: She is oriented to person, place, and time.  HENT:  Head: Normocephalic and atraumatic.  Mouth/Throat: Oropharynx is  clear and moist.  Eyes: Pupils are equal, round, and reactive to light. EOM are normal.  Neck: Normal range of motion.  Cardiovascular: Normal rate, regular rhythm and normal heart sounds.  Pulmonary/Chest: Effort normal and breath sounds normal.  Abdominal: Soft. Bowel sounds are normal.  Musculoskeletal: Normal range of motion. She exhibits no edema, tenderness or deformity.  She has moderate swelling of the right arm.  She has tenderness in the right forearm.  She has decreased range of motion of her right index finger.  Lymphadenopathy:    She has no cervical adenopathy.  Neurological: She is alert and oriented to person, place, and time.  Skin: Skin is warm and dry. No rash noted. No erythema.  Psychiatric: She has a normal mood and affect. Her behavior is normal. Judgment and thought content normal.  Vitals reviewed.   Lab Results  Component Value Date   WBC 45.6 (H) 01/13/2018   HGB 9.8 (L) 01/13/2018   HCT 31.7 (L) 01/13/2018   MCV 71.2 (L) 01/13/2018   PLT 176 01/13/2018   Lab Results  Component Value Date   FERRITIN 301 (H) 07/12/2017   IRON 26 (L) 07/12/2017   TIBC 243 07/12/2017   UIBC 217 07/12/2017   IRONPCTSAT 11 (L) 07/12/2017   Lab Results  Component Value Date   RBC 4.45 01/13/2018   No results found for: KPAFRELGTCHN, LAMBDASER, KAPLAMBRATIO Lab Results  Component Value Date   IGGSERUM 148 (L) 08/31/2017   IGA 39 (L) 08/31/2017   IGMSERUM <5 (L) 08/31/2017   Lab Results  Component Value Date   TOTALPROTELP 5.7 (L) 06/06/2012   ALBUMINELP 66.8 (H) 06/06/2012   A1GS 5.0 (H) 06/06/2012   A2GS 14.4 (H) 06/06/2012   BETS 7.0 06/06/2012   BETA2SER 3.3 06/06/2012   GAMS 3.5 (L) 06/06/2012   MSPIKE NOT DET 06/06/2012   SPEI * 06/06/2012     Chemistry      Component Value Date/Time   NA 134 12/14/2017 1122   NA 132 10/29/2016 1218   NA 135 (L) 03/29/2015 0908   K 3.6 12/14/2017 1122   K 3.5 10/29/2016 1218   K 3.5 03/29/2015 0908   CL 97 (  L)  12/14/2017 1122   CL 94 (L) 10/29/2016 1218   CO2 29 12/14/2017 1122   CO2 27 10/29/2016 1218   CO2 25 03/29/2015 0908   BUN 12 12/14/2017 1122   BUN 4 (L) 10/29/2016 1218   BUN 7.9 03/29/2015 0908   CREATININE 0.80 12/14/2017 1122   CREATININE 0.8 10/29/2016 1218   CREATININE 0.7 03/29/2015 0908      Component Value Date/Time   CALCIUM 8.9 12/14/2017 1122   CALCIUM 9.0 10/29/2016 1218   CALCIUM 8.6 03/29/2015 0908   ALKPHOS 73 12/14/2017 1122   ALKPHOS 94 (H) 10/29/2016 1218   ALKPHOS 63 03/29/2015 0908   AST 18 12/14/2017 1122   AST 10 03/29/2015 0908   ALT 19 12/14/2017 1122   ALT 16 10/29/2016 1218   ALT 11 03/29/2015 0908   BILITOT 0.7 12/14/2017 1122   BILITOT 0.44 03/29/2015 0908     Impression and Plan: Stefanie Henry is a very pleasant 84 yo [place] female with history of CLL and iron deficiency anemia.  It is encouraging that the white cell count is coming down.  Again, this mass in the right forearm is not improving.  We will try to get the MRI in the next couple days.  This is incredibly complicated.  She is elderly.  She is not in the best of shape.  However, the possibility of a amputation of the right forearm is definitely a concern.  If she had an amputation, we will clearly have enough material to find out what we are dealing with cancer wise.  We will have to continue to follow Stefanie Henry very closely.  I would like to see her back in another 3 weeks.Marland Kitchen  [name], MD 7/18/201911:29 AM

## 2018-01-13 NOTE — Progress Notes (Signed)
Patient is accompanied by son, daughter and interpreter Riz from [employer].

## 2018-01-14 LAB — IRON AND TIBC
Iron: 35 ug/dL — ABNORMAL LOW (ref 41–142)
Saturation Ratios: 13 % — ABNORMAL LOW (ref 21–57)
TIBC: 280 ug/dL (ref 236–444)
UIBC: 245 ug/dL

## 2018-01-14 LAB — FERRITIN: FERRITIN: 340 ng/mL — AB (ref 11–307)

## 2018-01-22 ENCOUNTER — Ambulatory Visit (HOSPITAL_BASED_OUTPATIENT_CLINIC_OR_DEPARTMENT_OTHER)
Admission: RE | Admit: 2018-01-22 | Discharge: 2018-01-22 | Disposition: A | Discharge status: Home / Self Care | Payer: Medicare Other | Source: Ambulatory Visit | Attending: Family | Admitting: Family

## 2018-01-22 DIAGNOSIS — C9190 Lymphoid leukemia, unspecified not having achieved remission: Secondary | ICD-10-CM | POA: Insufficient documentation

## 2018-01-22 DIAGNOSIS — R2231 Localized swelling, mass and lump, right upper limb: Secondary | ICD-10-CM | POA: Insufficient documentation

## 2018-01-22 DIAGNOSIS — C9110 Chronic lymphocytic leukemia of B-cell type not having achieved remission: Secondary | ICD-10-CM

## 2018-01-22 MED ORDER — GADOBENATE DIMEGLUMINE 529 MG/ML IV SOLN
10.0000 mL | Freq: Once | INTRAVENOUS | Status: AC | PRN
  Administered 2018-01-22: 10 mL via INTRAVENOUS

## 2018-01-25 ENCOUNTER — Other Ambulatory Visit: Payer: Self-pay | Admitting: Family

## 2018-01-25 DIAGNOSIS — M7989 Other specified soft tissue disorders: Secondary | ICD-10-CM | POA: Diagnosis not present

## 2018-01-25 DIAGNOSIS — C499 Malignant neoplasm of connective and soft tissue, unspecified: Secondary | ICD-10-CM

## 2018-01-25 DIAGNOSIS — C9110 Chronic lymphocytic leukemia of B-cell type not having achieved remission: Secondary | ICD-10-CM

## 2018-01-25 DIAGNOSIS — M799 Soft tissue disorder, unspecified: Secondary | ICD-10-CM | POA: Diagnosis not present

## 2018-01-25 DIAGNOSIS — C9190 Lymphoid leukemia, unspecified not having achieved remission: Secondary | ICD-10-CM | POA: Diagnosis not present

## 2018-02-03 ENCOUNTER — Encounter (HOSPITAL_COMMUNITY)
Admission: RE | Admit: 2018-02-03 | Discharge: 2018-02-03 | Disposition: A | Discharge status: Home / Self Care | Payer: Medicare Other | Source: Ambulatory Visit | Attending: Family | Admitting: Family

## 2018-02-03 DIAGNOSIS — C499 Malignant neoplasm of connective and soft tissue, unspecified: Secondary | ICD-10-CM | POA: Diagnosis not present

## 2018-02-03 DIAGNOSIS — C9190 Lymphoid leukemia, unspecified not having achieved remission: Secondary | ICD-10-CM | POA: Diagnosis not present

## 2018-02-03 DIAGNOSIS — C9110 Chronic lymphocytic leukemia of B-cell type not having achieved remission: Secondary | ICD-10-CM

## 2018-02-03 DIAGNOSIS — C787 Secondary malignant neoplasm of liver and intrahepatic bile duct: Secondary | ICD-10-CM | POA: Diagnosis not present

## 2018-02-03 DIAGNOSIS — C7951 Secondary malignant neoplasm of bone: Secondary | ICD-10-CM | POA: Diagnosis not present

## 2018-02-03 LAB — GLUCOSE, CAPILLARY: Glucose-Capillary: 146 mg/dL — ABNORMAL HIGH (ref 70–99)

## 2018-02-03 IMAGING — DX DG HIP (WITH OR WITHOUT PELVIS) 2-3V*L*
3 series · 3 of 3 positions shown · non-contrast
Comparison: CT 131 18

CLINICAL DATA: Persistent LEFT hip pain. Worsening over several
days

EXAM:
DG HIP (WITH OR WITHOUT PELVIS) 2-3V LEFT

[pelvis ap]
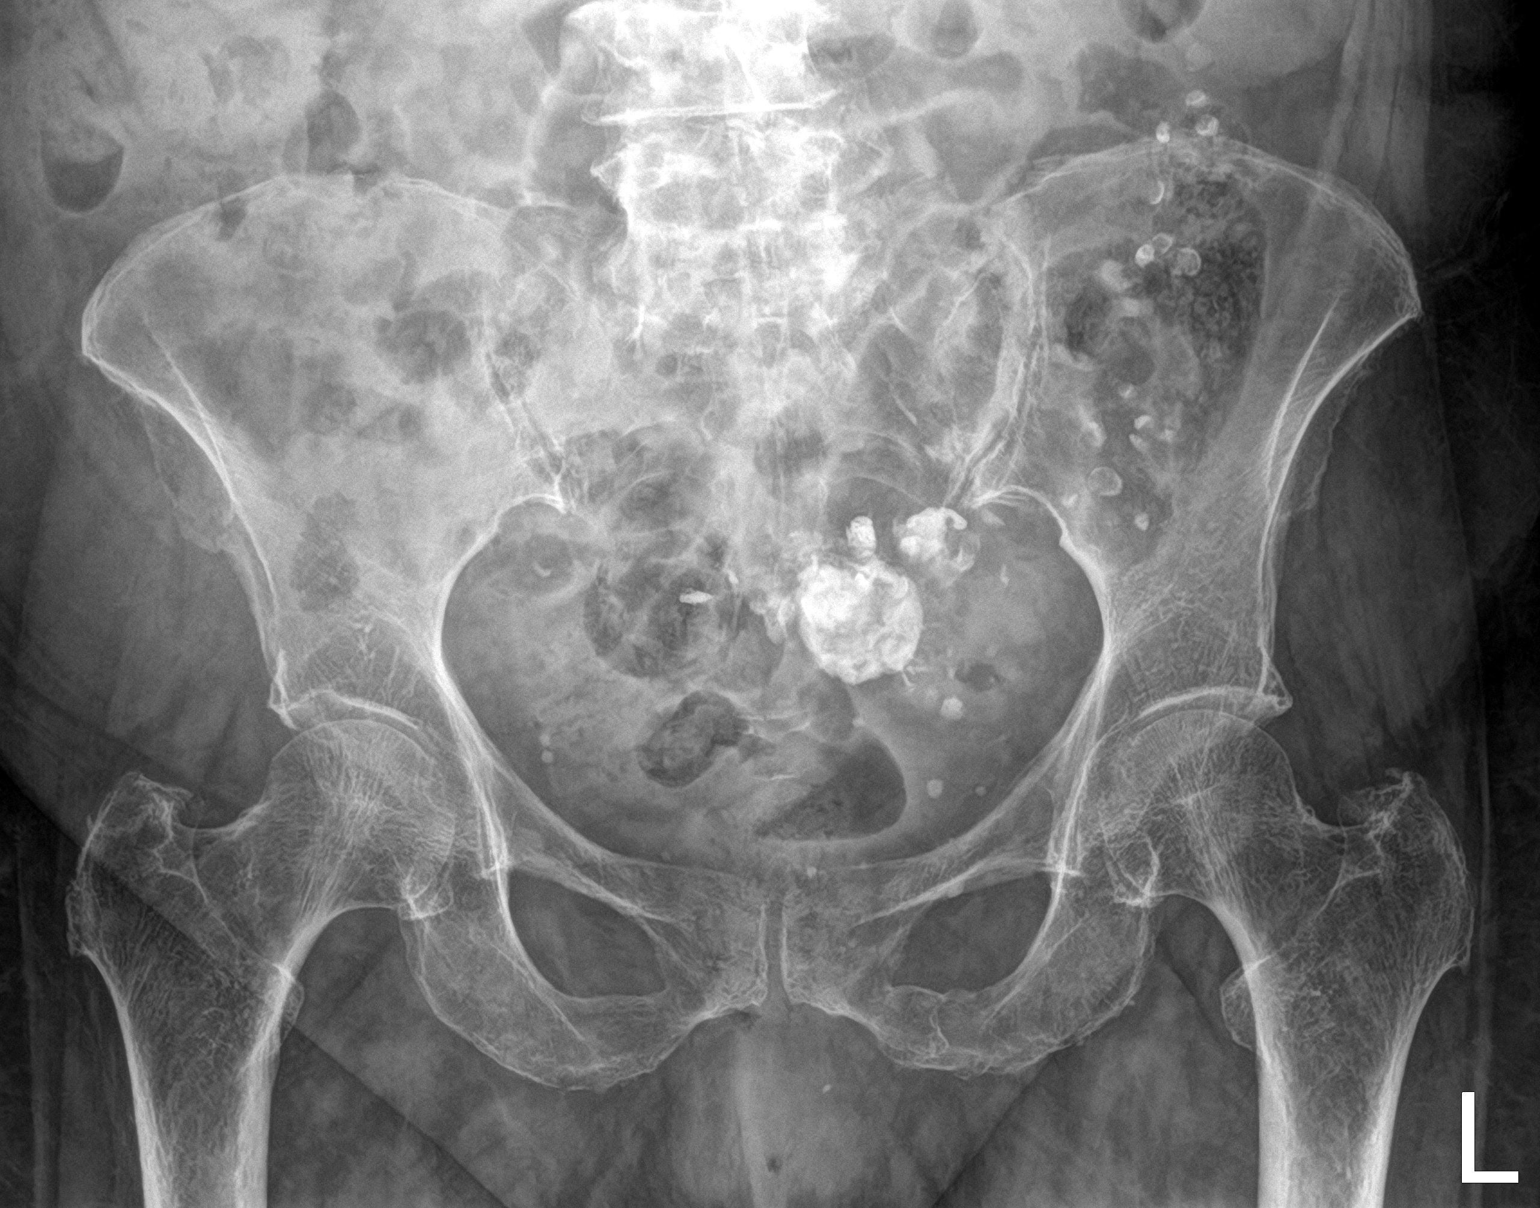

[hip ap]
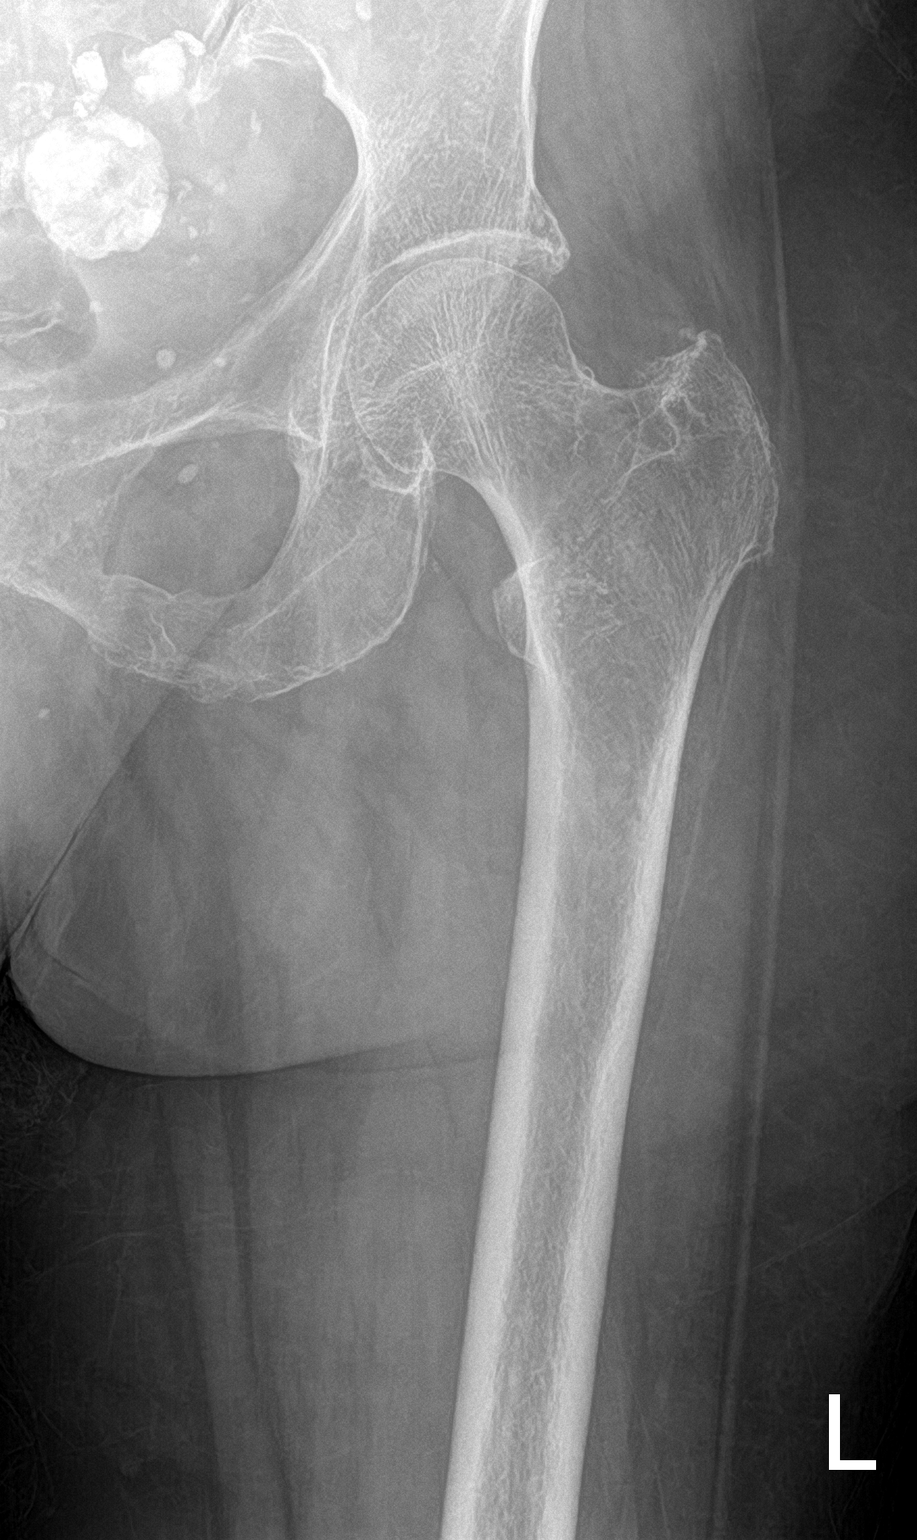

[hip lat]
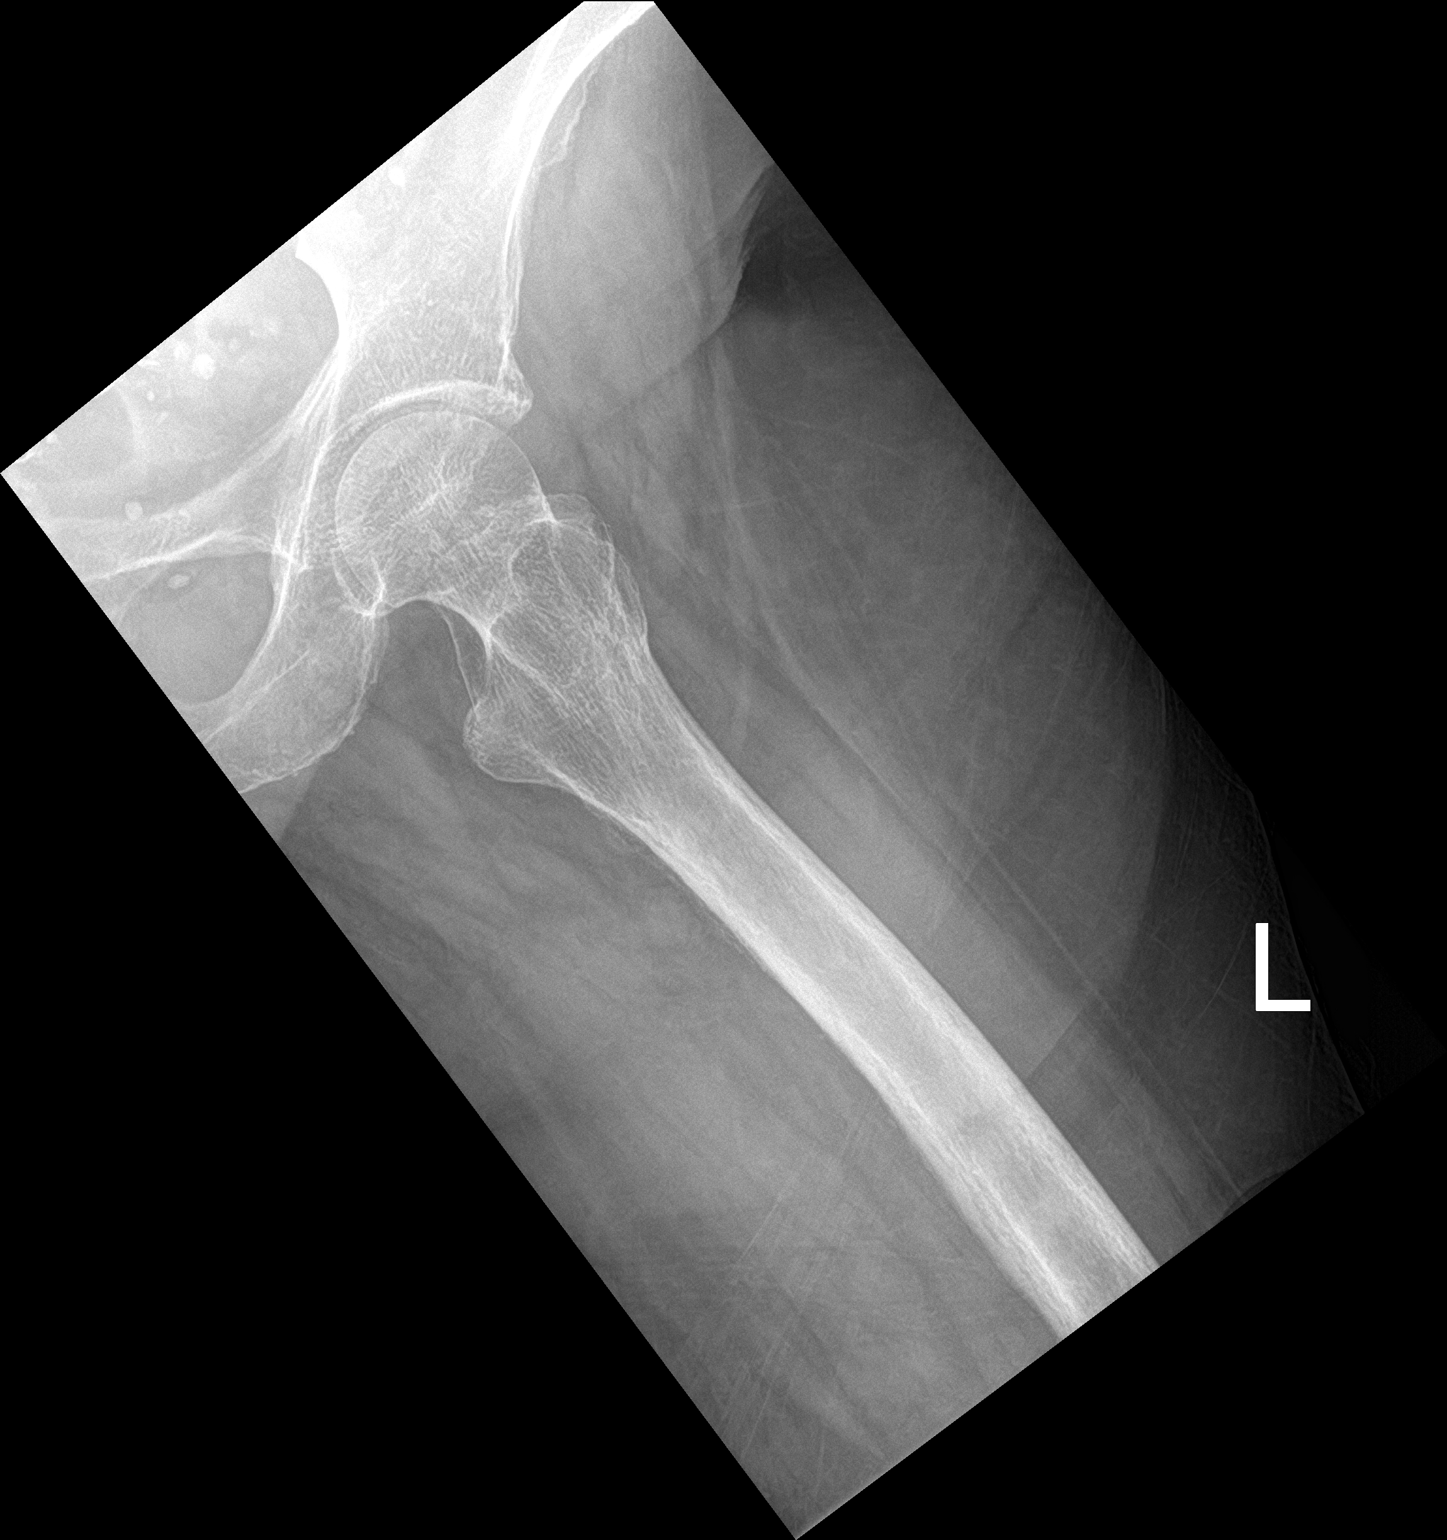

[3 of 3 positions shown; findings below may reference images not displayed]

FINDINGS: Hips are located. No evidence of pelvic fracture or sacral fracture.
Dedicated view of the LEFT hip demonstrates no femoral neck
fracture. Calcified leiomyoma. Diverticulosis of the LEFT of the
colon.
IMPRESSION: No pelvic fracture or LEFT hip fracture.

## 2018-02-03 IMAGING — DX DG FEMUR 2+V*L*
3 series · 3 of 3 positions shown · non-contrast
Comparison: None.

CLINICAL DATA: LEFT femur pain

EXAM:
LEFT FEMUR 2 VIEWS

[femur ap (1 of 2)]
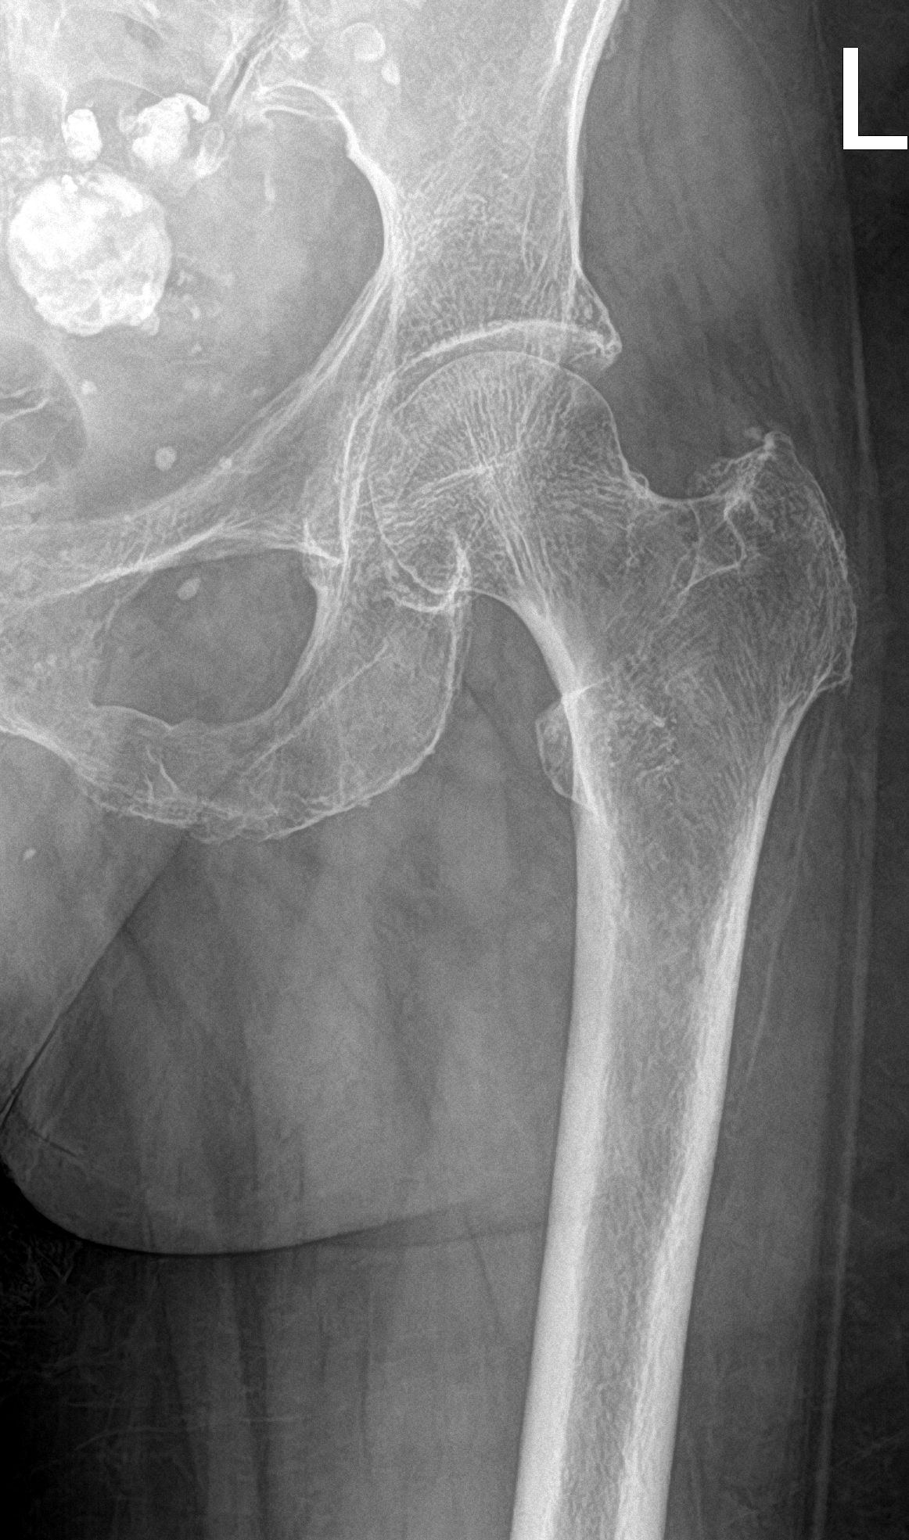

[femur lat]
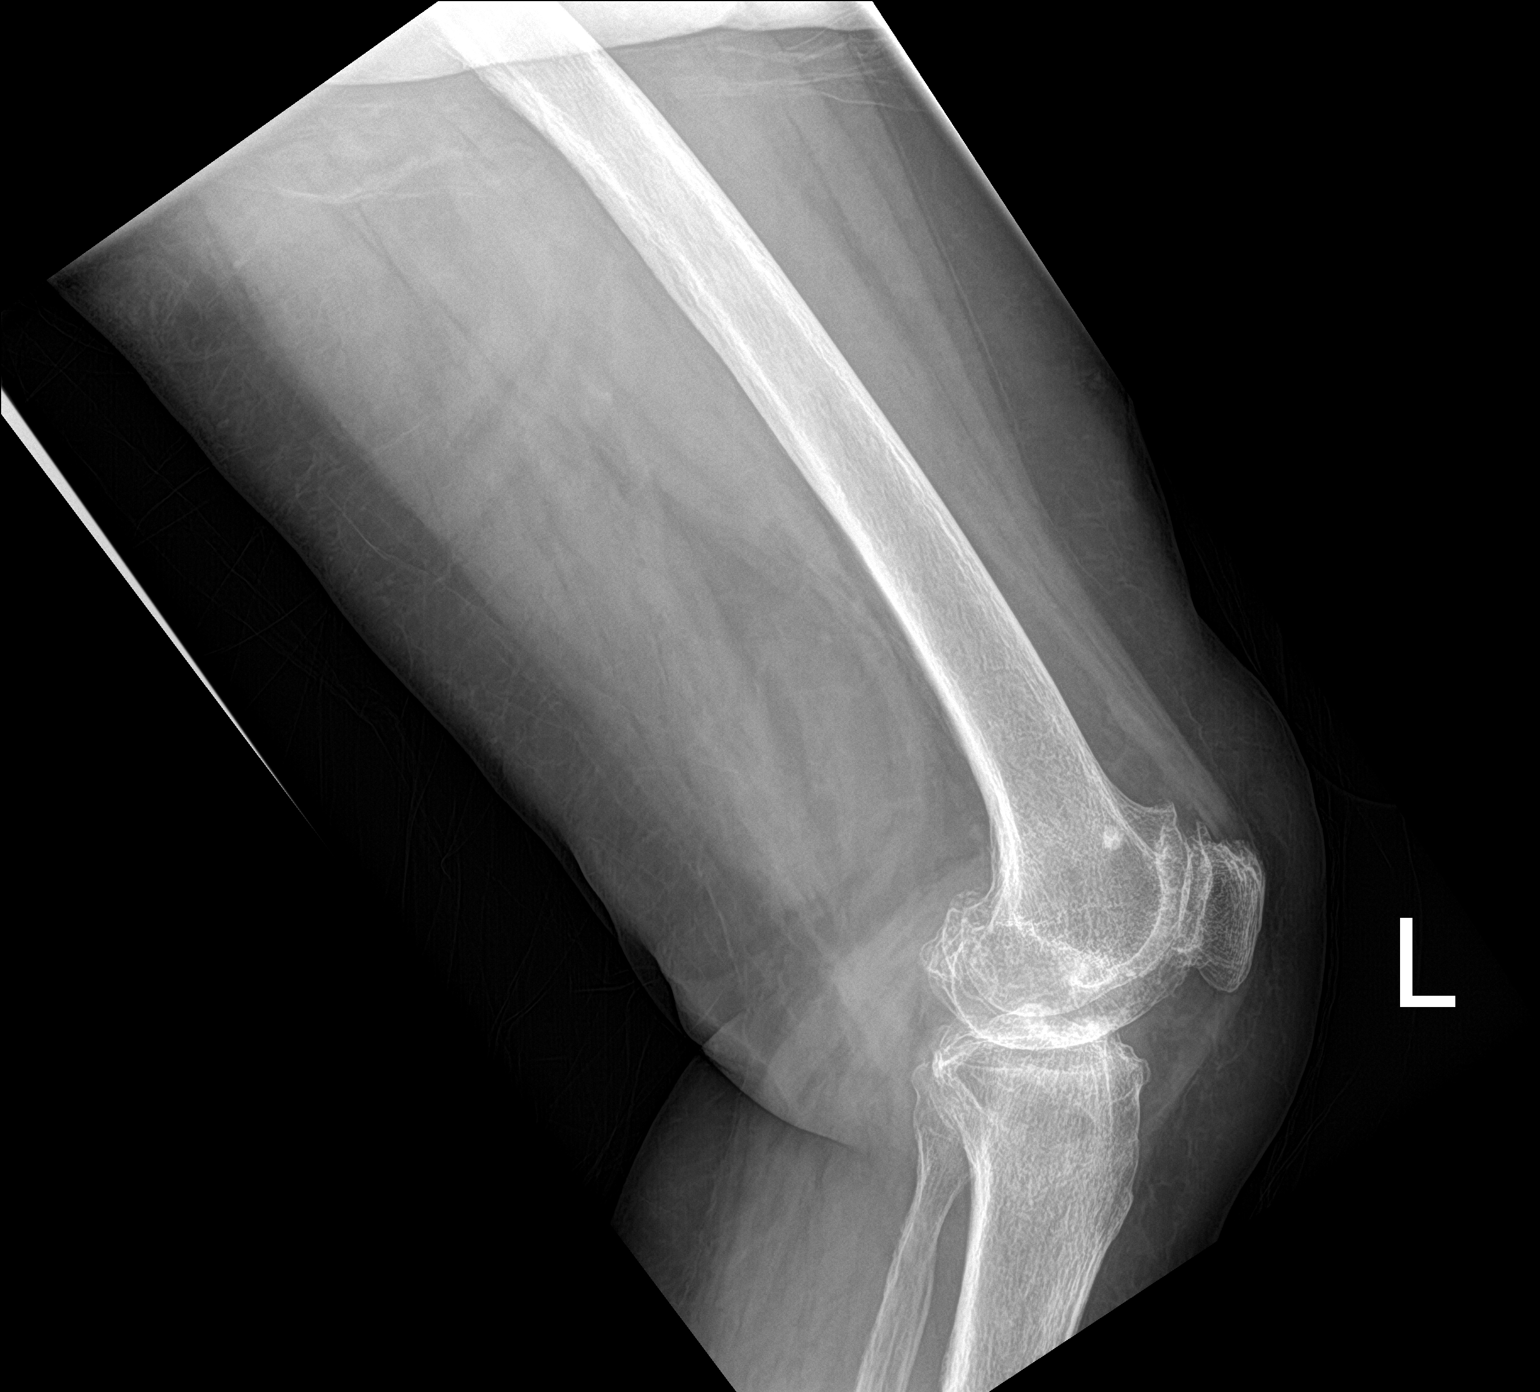

[femur ap (2 of 2)]
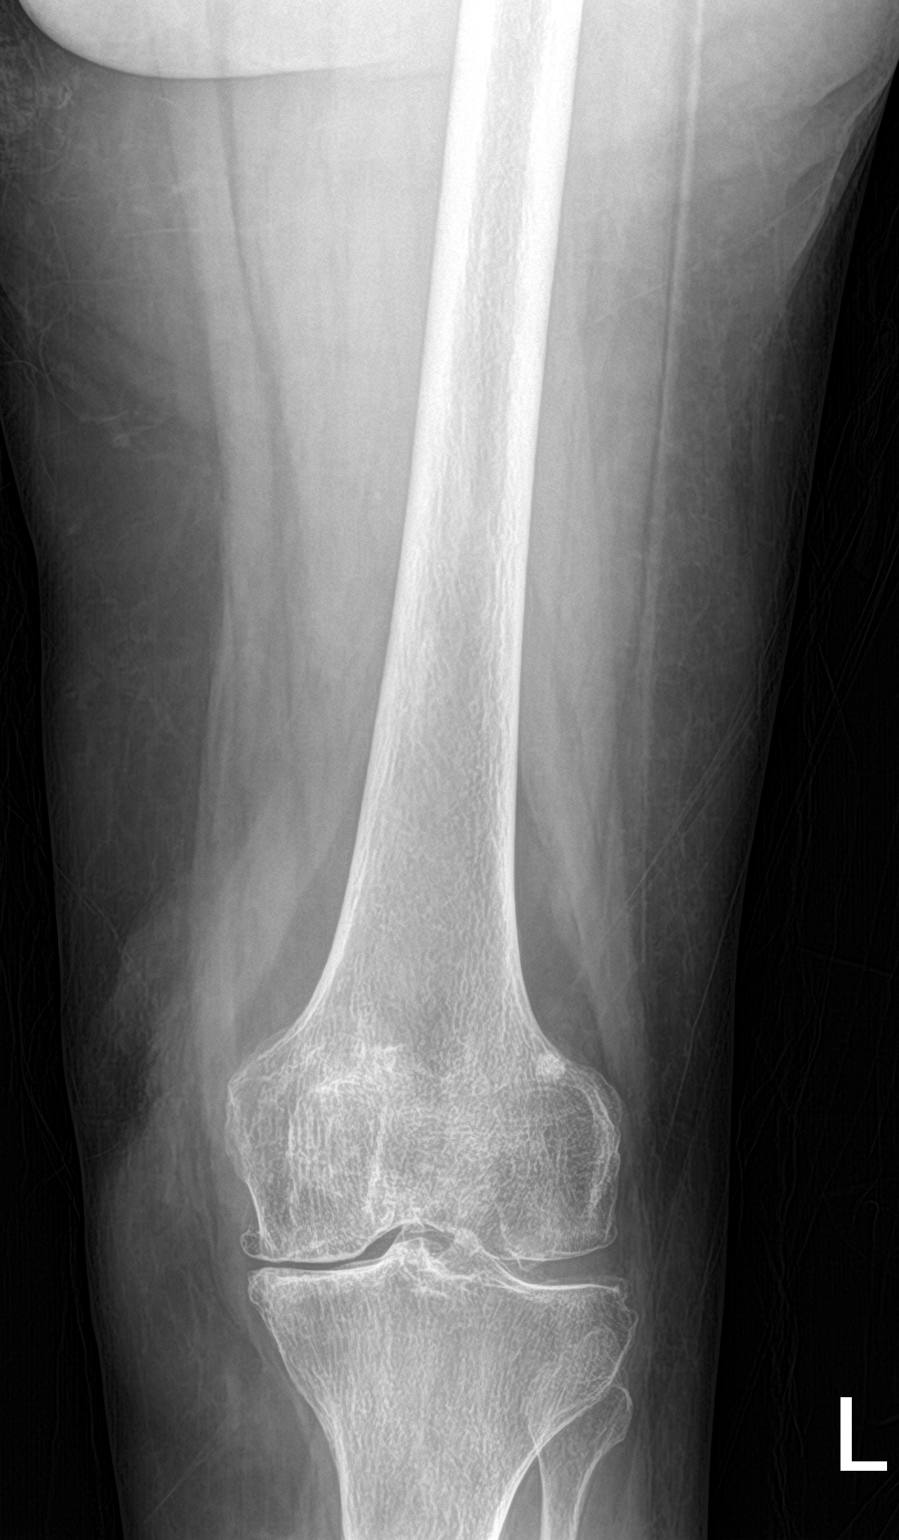

[3 of 3 positions shown; findings below may reference images not displayed]

FINDINGS: The LEFT hip is located. No femoral neck fracture. No evidence of
fracture of the distal LEFT femur. There is degenerate osteophytosis
of the LEFT knee joint. No acute findings of the LEFT femur.
IMPRESSION: No acute findings of the LEFT femur.

## 2018-02-03 MED ORDER — FLUDEOXYGLUCOSE F - 18 (FDG) INJECTION
5.8000 | Freq: Once | INTRAVENOUS | Status: AC
  Administered 2018-02-03: 5.8 via INTRAVENOUS

## 2018-02-04 ENCOUNTER — Other Ambulatory Visit: Payer: Medicare Other

## 2018-02-04 ENCOUNTER — Ambulatory Visit: Payer: Medicare Other | Admitting: Hematology & Oncology

## 2018-02-11 ENCOUNTER — Other Ambulatory Visit: Payer: Self-pay | Admitting: *Deleted

## 2018-02-11 MED ORDER — HYDROCODONE-ACETAMINOPHEN 5-325 MG PO TABS: 1.0000 | ORAL_TABLET | Freq: Four times a day (QID) | ORAL | 0 refills | Status: AC | PRN

## 2018-02-14 DIAGNOSIS — M7989 Other specified soft tissue disorders: Secondary | ICD-10-CM | POA: Diagnosis not present

## 2018-02-14 DIAGNOSIS — R2231 Localized swelling, mass and lump, right upper limb: Secondary | ICD-10-CM | POA: Diagnosis not present

## 2018-02-14 DIAGNOSIS — M799 Soft tissue disorder, unspecified: Secondary | ICD-10-CM | POA: Diagnosis not present

## 2018-02-15 ENCOUNTER — Telehealth: Payer: Self-pay | Admitting: Pharmacy Technician

## 2018-02-15 NOTE — Telephone Encounter (Signed)
Oral Oncology Patient Advocate Encounter    [place] emailed me regarding Mrs Skipper.  They have tried reaching her multiple times to set up delivery of her Imbruvica through [facility] but have not had a return call.     I have reached out to both home and mobile numbers and left messages for the patient to return my call so we can get her medication sent to her.  There have not been any return phone calls.    [facility] [phone]) to fill her Imbruvica.  Please contact me or Nuala Alpha.  [place] [facility] [place] Phone [phone] Fax [phone] 02/15/2018 3:04 PM

## 2018-02-17 ENCOUNTER — Inpatient Hospital Stay: Payer: Medicare Other | Admitting: Hematology & Oncology

## 2018-02-17 ENCOUNTER — Encounter: Payer: Self-pay | Admitting: Hematology & Oncology

## 2018-02-17 ENCOUNTER — Inpatient Hospital Stay: Payer: Medicare Other

## 2018-02-17 ENCOUNTER — Telehealth: Payer: Self-pay | Admitting: *Deleted

## 2018-02-17 ENCOUNTER — Other Ambulatory Visit: Payer: Self-pay | Admitting: Hematology & Oncology

## 2018-02-17 MED ORDER — GABAPENTIN 100 MG PO CAPS: ORAL_CAPSULE | ORAL | 4 refills | Status: AC

## 2018-02-17 MED ORDER — FENTANYL 12 MCG/HR TD PT72: 12.5000 ug | MEDICATED_PATCH | TRANSDERMAL | 0 refills | Status: AC

## 2018-02-17 MED ORDER — MORPHINE SULFATE (CONCENTRATE) 20 MG/ML PO SOLN: 10.0000 mg | ORAL | 0 refills | Status: AC | PRN

## 2018-02-17 NOTE — Progress Notes (Signed)
I had a long talk with Mr. Veach today.  His mother cannot make it in.  She clearly has a highly aggressive malignancy.  She has had the CLL.  She has been on therapy for this.  She actually has responded.  She is on [place].  Her white cell count came down.  However, she has this mass in her right forearm.  This has been biopsied and was felt to be related to the CLL.  However, it is clear that this is not CLL or lymphoma.  She was seen at [facility].  She was seen by Dr. Mylo Red.  She did not feel that anything could be done outside of a amputation of her right forearm.  He did not want to have this done to his mother.  We did a PET scan on her.  She has disease in her lung, liver and bone.  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  At this point, our goal of care clearly is quality of life.  However we can help her quality of life we need to do.  I recommended that hospice be involved.  Hospice will do a very good job and keep her home.  This is where he wants to have his mother.  I told him that hospice will not cost anything.  They can provide equipment.  They can provide medications.  They have counselors that can help her deal with this situation that is terminal.  He wanted to know how much longer I thought she had.  I told him that I thought that she might have another 3-4 months at best.  Again, pain is the biggest issue with her.  It is obvious why she has the pain.  We will try her on a fentanyl patch (12.5 mcg to the skin q. 3 days) and also Roxanol elixir (0.5 cc p.o. every 4 hours as needed) and hopefully this will help with her discomfort.  She is 84 years old so we had to be very careful with her pain medications as she might be very sensitive.  Again, our goal for Ms. Costley is comfort.  I spent about 45 minutes with her son.  I have known them for about 10 years.  I just am absolutely before told as to how she could have developed this incredibly  aggressive malignancy.  Again we do not have a biopsy but I am fairly confident as to what it is.  I think Dr. Mylo Red is also thinking along the same lines.  We can get her back to the office if necessary.  However, it sounds like she just is going to be too difficult to transport to [place].  Again, hospice will do a great job and will help [place] know what is going on and how we can help her in the future.  Lattie Haw, MD

## 2018-02-17 NOTE — Telephone Encounter (Signed)
Hospice referral placed with [facility] per Dr Antonieta Pert request.

## 2018-02-18 DIAGNOSIS — I5030 Unspecified diastolic (congestive) heart failure: Secondary | ICD-10-CM | POA: Diagnosis not present

## 2018-02-18 DIAGNOSIS — C773 Secondary and unspecified malignant neoplasm of axilla and upper limb lymph nodes: Secondary | ICD-10-CM | POA: Diagnosis not present

## 2018-02-18 DIAGNOSIS — K219 Gastro-esophageal reflux disease without esophagitis: Secondary | ICD-10-CM | POA: Diagnosis not present

## 2018-02-18 DIAGNOSIS — D630 Anemia in neoplastic disease: Secondary | ICD-10-CM | POA: Diagnosis not present

## 2018-02-18 DIAGNOSIS — I82409 Acute embolism and thrombosis of unspecified deep veins of unspecified lower extremity: Secondary | ICD-10-CM | POA: Diagnosis not present

## 2018-02-18 DIAGNOSIS — M810 Age-related osteoporosis without current pathological fracture: Secondary | ICD-10-CM | POA: Diagnosis not present

## 2018-02-18 DIAGNOSIS — D6869 Other thrombophilia: Secondary | ICD-10-CM | POA: Diagnosis not present

## 2018-02-18 DIAGNOSIS — C9592 Leukemia, unspecified, in relapse: Secondary | ICD-10-CM | POA: Diagnosis not present

## 2018-02-18 DIAGNOSIS — J449 Chronic obstructive pulmonary disease, unspecified: Secondary | ICD-10-CM | POA: Diagnosis not present

## 2018-02-18 IMAGING — CT CT ABD-PELV W/ CM
2 of 5 series · 15 of 46 positions shown, 17 images · IV contrast (APPLIED)
Comparison: 07/29/2016 CT abdomen/pelvis.

CLINICAL DATA: Lower abdominal pain.  Nausea.  Constipation.

EXAM:
CT ABDOMEN AND PELVIS WITH CONTRAST
TECHNIQUE: Multidetector CT imaging of the abdomen and pelvis was performed
using the standard protocol following bolus administration of
intravenous contrast.
CONTRAST:  100mL V2CVHF-R00 IOPAMIDOL (V2CVHF-R00) INJECTION 61%

[Series 2: axial st · axial · 0.83mm/px · z∈[-372,-2]mm · 12 of 84 slices shown, 14 images]
[im 5/84  soft-tissue]
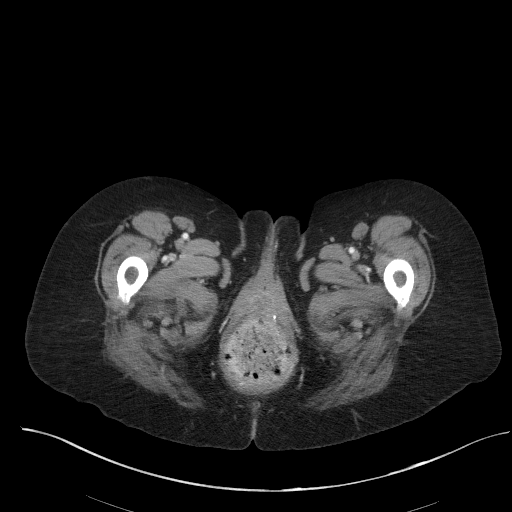
[im 5/84  bone]
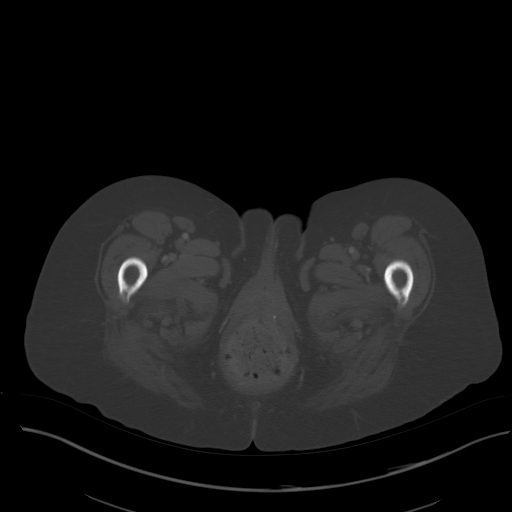
[im 14/84  soft-tissue]
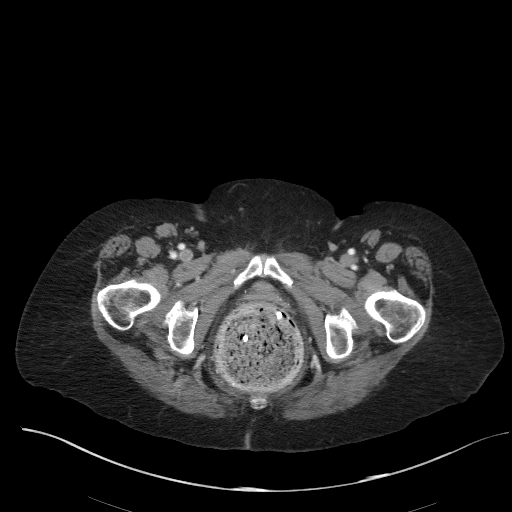
[im 19/84  soft-tissue]
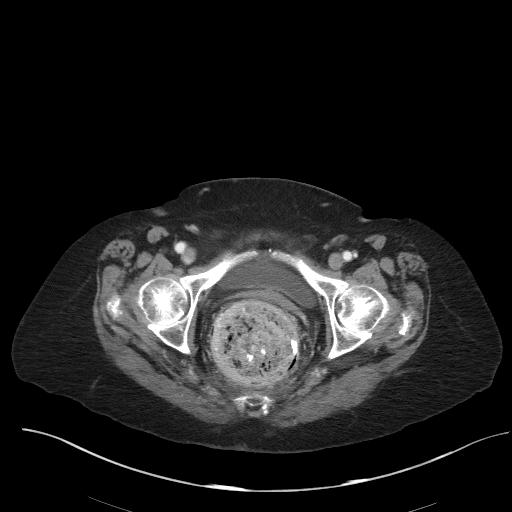
[im 24/84  soft-tissue]
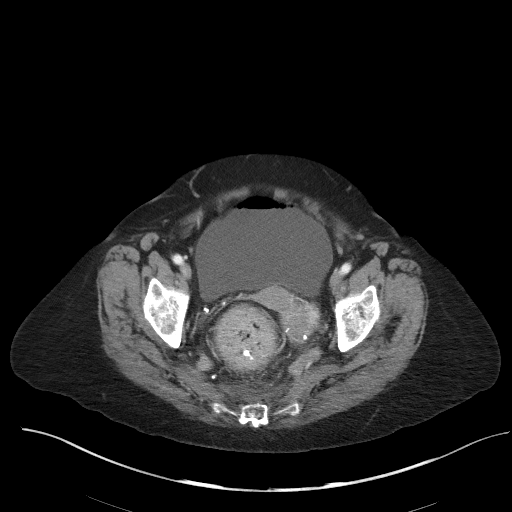
[im 33/84  soft-tissue]
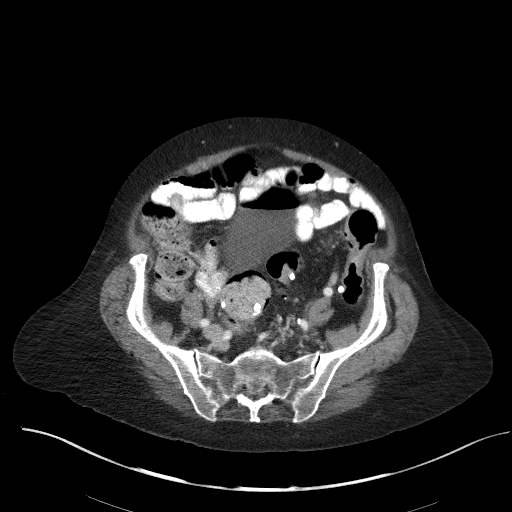
[im 37/84  soft-tissue]
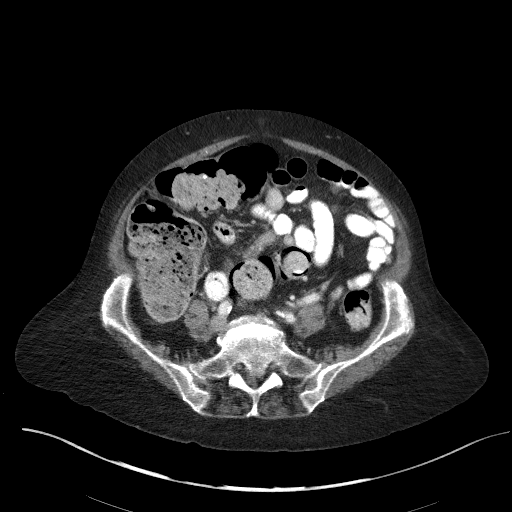
[im 47/84  soft-tissue]
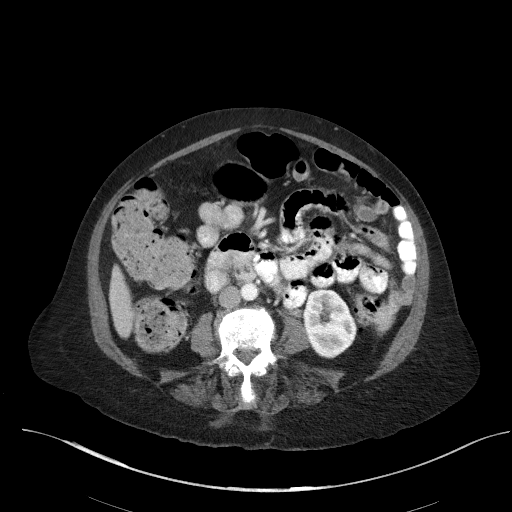
[im 51/84  soft-tissue]
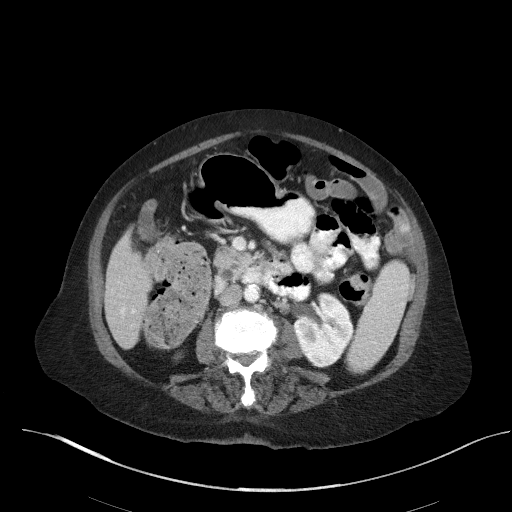
[im 60/84  soft-tissue]
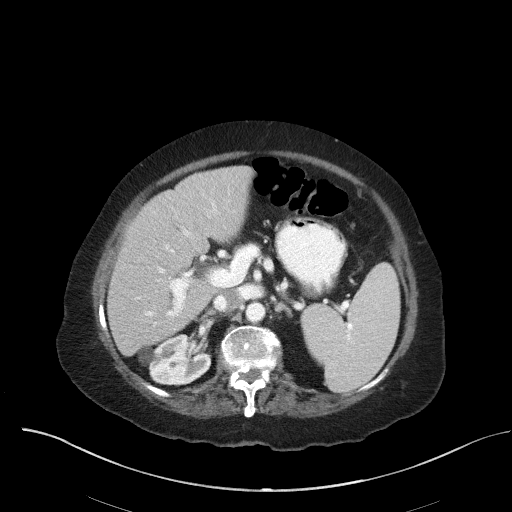
[im 60/84  bone]
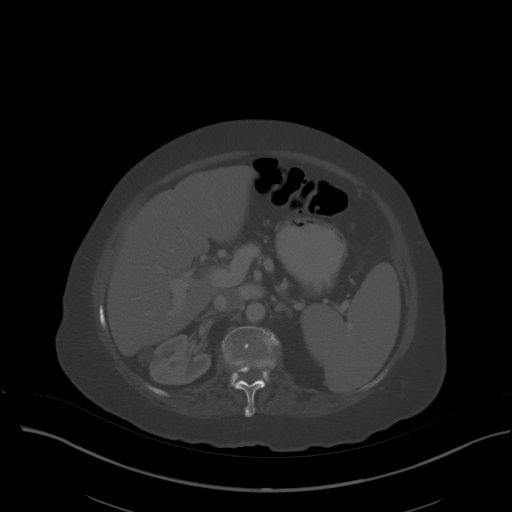
[im 65/84  soft-tissue]
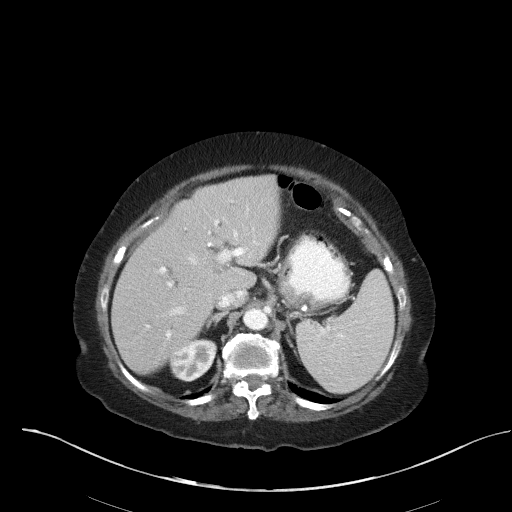
[im 70/84  soft-tissue]
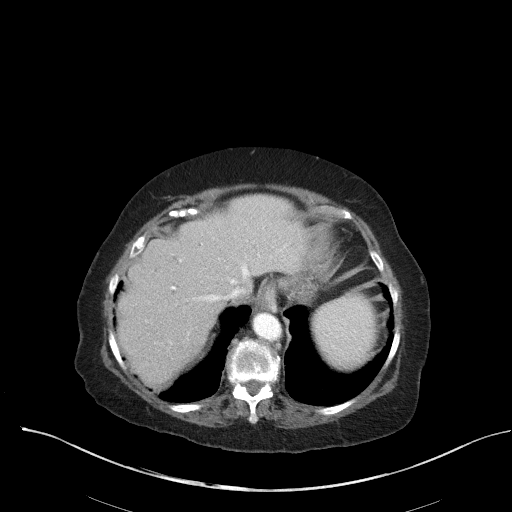
[im 79/84  soft-tissue]
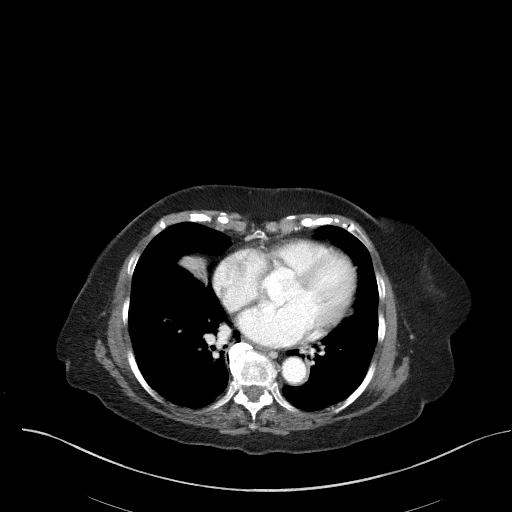

[Series 5: coronal st · coronal · 0.73mm/px · 3 of 89 slices shown]
[im 30/89  soft-tissue]
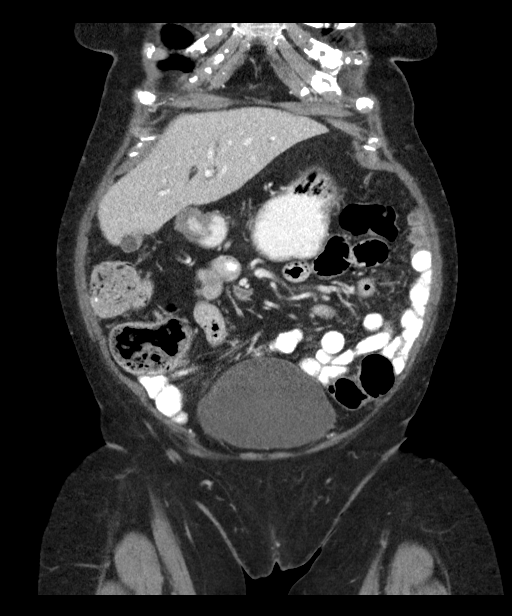
[im 40/89  soft-tissue]
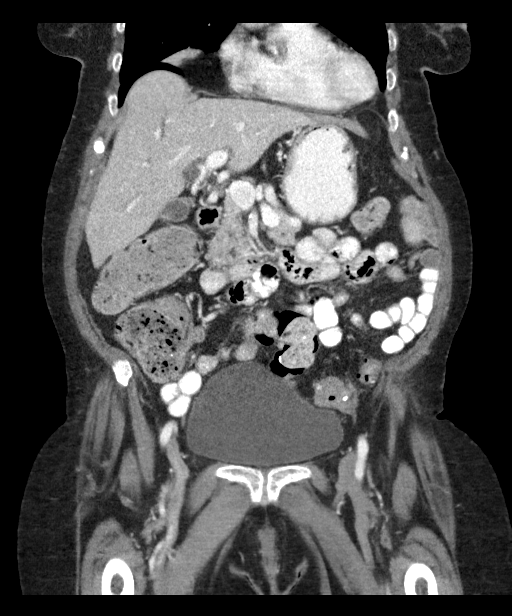
[im 49/89  soft-tissue]
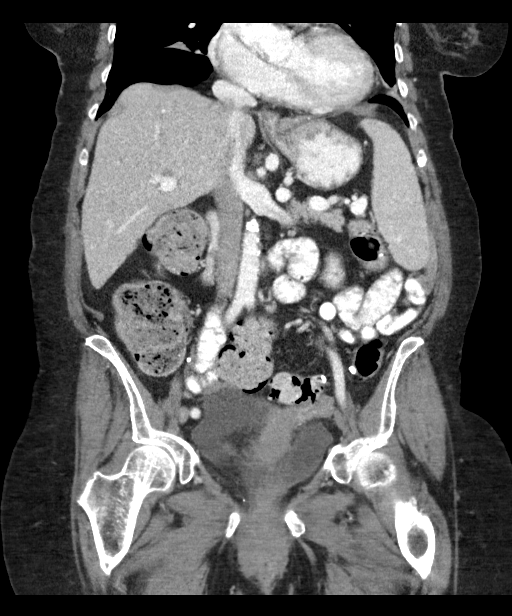

[15 of 46 positions shown; findings below may reference images not displayed]

FINDINGS: Lower chest: Stable near complete right middle lobe atelectasis.
Oral contrast level is seen in the lower thoracic esophagus.

Hepatobiliary: Normal liver with no liver mass. Nondistended
gallbladder. Known non radiopaque cholelithiasis. No gallbladder
wall thickening or pericholecystic fluid. Mild diffuse intrahepatic
biliary ductal dilatation and mildly dilated common bile duct (9 mm
diameter), not appreciably changed back to the 10/19/2014 MRI study.
No radiopaque choledocholithiasis.

Pancreas: Normal, with no mass or duct dilation.

Spleen: Normal size. No mass.

Adrenals/Urinary Tract: Normal adrenals. No hydronephrosis. Simple
1.7 cm lateral interpolar right renal cyst. Additional subcentimeter
hypodense renal cortical lesions in both kidneys are too small to
characterize and are stable back to 10/19/2014 MRI, suggesting
benign renal cysts. There is nondependent gas in the bladder. No
bladder wall thickening or bladder stones.

Stomach/Bowel: Tiny hiatal hernia. Otherwise grossly normal stomach.
Normal caliber small bowel with no small bowel wall thickening.
Normal appendix. Oral contrast progresses to the pelvic small bowel.
Moderate colonic stool in the right colon. Moderate sigmoid
diverticulosis, with no colonic wall thickening or pericolonic fat
stranding. Prominent rectal distention by stool up to the 7.5 cm
diameter with associated mild mid to lower rectal wall thickening
and perirectal fat stranding, new since 07/29/2016, suggesting
rectal fecal impaction with stercoral colitis.

Vascular/Lymphatic: Atherosclerotic nonaneurysmal abdominal aorta.
Patent portal, splenic, hepatic and renal veins. No pathologically
enlarged lymph nodes in the abdomen or pelvis.

Reproductive: Stable mildly enlarged myomatous uterus with coarsely
calcified degenerated fibroids. No adnexal mass.

Other: No pneumoperitoneum, ascites or focal fluid collection.
Stable tiny fat containing umbilical hernia.

Musculoskeletal: No aggressive appearing focal osseous lesions.
Marked thoracolumbar spondylosis. Stable moderate L1 chronic
compression fracture. Evolving mild subacute L4 vertebral
compression fracture. New mild L3 vertebral compression fracture.
IMPRESSION: 1. New rectal fecal impaction with stercoral colitis. No free air.
No small bowel obstruction.
2. New mild L3 vertebral compression fracture. Evolving mild
subacute L4 vertebral compression fracture. Stable chronic moderate
L1 vertebral compression fracture.
3. Additional findings include aortic atherosclerosis, tiny hiatal
hernia, oral contrast in the lower thoracic esophagus suggesting
esophageal dysmotility and/or gastroesophageal reflux,
cholelithiasis, chronic mild biliary ductal dilatation, chronic near
complete right middle lobe atelectasis, moderate sigmoid
diverticulosis and myomatous uterus.

## 2018-02-19 DIAGNOSIS — C773 Secondary and unspecified malignant neoplasm of axilla and upper limb lymph nodes: Secondary | ICD-10-CM | POA: Diagnosis not present

## 2018-02-19 DIAGNOSIS — J449 Chronic obstructive pulmonary disease, unspecified: Secondary | ICD-10-CM | POA: Diagnosis not present

## 2018-02-19 DIAGNOSIS — D6869 Other thrombophilia: Secondary | ICD-10-CM | POA: Diagnosis not present

## 2018-02-19 DIAGNOSIS — C9592 Leukemia, unspecified, in relapse: Secondary | ICD-10-CM | POA: Diagnosis not present

## 2018-02-19 DIAGNOSIS — I82409 Acute embolism and thrombosis of unspecified deep veins of unspecified lower extremity: Secondary | ICD-10-CM | POA: Diagnosis not present

## 2018-02-19 DIAGNOSIS — I5030 Unspecified diastolic (congestive) heart failure: Secondary | ICD-10-CM | POA: Diagnosis not present

## 2018-02-21 DIAGNOSIS — D6869 Other thrombophilia: Secondary | ICD-10-CM | POA: Diagnosis not present

## 2018-02-21 DIAGNOSIS — C9592 Leukemia, unspecified, in relapse: Secondary | ICD-10-CM | POA: Diagnosis not present

## 2018-02-21 DIAGNOSIS — I82409 Acute embolism and thrombosis of unspecified deep veins of unspecified lower extremity: Secondary | ICD-10-CM | POA: Diagnosis not present

## 2018-02-21 DIAGNOSIS — C773 Secondary and unspecified malignant neoplasm of axilla and upper limb lymph nodes: Secondary | ICD-10-CM | POA: Diagnosis not present

## 2018-02-21 DIAGNOSIS — J449 Chronic obstructive pulmonary disease, unspecified: Secondary | ICD-10-CM | POA: Diagnosis not present

## 2018-02-21 DIAGNOSIS — I5030 Unspecified diastolic (congestive) heart failure: Secondary | ICD-10-CM | POA: Diagnosis not present

## 2018-02-24 DIAGNOSIS — I5030 Unspecified diastolic (congestive) heart failure: Secondary | ICD-10-CM | POA: Diagnosis not present

## 2018-02-24 DIAGNOSIS — C773 Secondary and unspecified malignant neoplasm of axilla and upper limb lymph nodes: Secondary | ICD-10-CM | POA: Diagnosis not present

## 2018-02-24 DIAGNOSIS — C9592 Leukemia, unspecified, in relapse: Secondary | ICD-10-CM | POA: Diagnosis not present

## 2018-02-24 DIAGNOSIS — J449 Chronic obstructive pulmonary disease, unspecified: Secondary | ICD-10-CM | POA: Diagnosis not present

## 2018-02-24 DIAGNOSIS — D6869 Other thrombophilia: Secondary | ICD-10-CM | POA: Diagnosis not present

## 2018-02-24 DIAGNOSIS — I82409 Acute embolism and thrombosis of unspecified deep veins of unspecified lower extremity: Secondary | ICD-10-CM | POA: Diagnosis not present

## 2018-02-25 DIAGNOSIS — C773 Secondary and unspecified malignant neoplasm of axilla and upper limb lymph nodes: Secondary | ICD-10-CM | POA: Diagnosis not present

## 2018-02-25 DIAGNOSIS — I82409 Acute embolism and thrombosis of unspecified deep veins of unspecified lower extremity: Secondary | ICD-10-CM | POA: Diagnosis not present

## 2018-02-25 DIAGNOSIS — J449 Chronic obstructive pulmonary disease, unspecified: Secondary | ICD-10-CM | POA: Diagnosis not present

## 2018-02-25 DIAGNOSIS — I5030 Unspecified diastolic (congestive) heart failure: Secondary | ICD-10-CM | POA: Diagnosis not present

## 2018-02-25 DIAGNOSIS — C9592 Leukemia, unspecified, in relapse: Secondary | ICD-10-CM | POA: Diagnosis not present

## 2018-02-25 DIAGNOSIS — D6869 Other thrombophilia: Secondary | ICD-10-CM | POA: Diagnosis not present

## 2018-02-27 DIAGNOSIS — M810 Age-related osteoporosis without current pathological fracture: Secondary | ICD-10-CM | POA: Diagnosis not present

## 2018-02-27 DIAGNOSIS — D6869 Other thrombophilia: Secondary | ICD-10-CM | POA: Diagnosis not present

## 2018-02-27 DIAGNOSIS — D630 Anemia in neoplastic disease: Secondary | ICD-10-CM | POA: Diagnosis not present

## 2018-02-27 DIAGNOSIS — K219 Gastro-esophageal reflux disease without esophagitis: Secondary | ICD-10-CM | POA: Diagnosis not present

## 2018-02-27 DIAGNOSIS — I82409 Acute embolism and thrombosis of unspecified deep veins of unspecified lower extremity: Secondary | ICD-10-CM | POA: Diagnosis not present

## 2018-02-27 DIAGNOSIS — I5030 Unspecified diastolic (congestive) heart failure: Secondary | ICD-10-CM | POA: Diagnosis not present

## 2018-02-27 DIAGNOSIS — C9592 Leukemia, unspecified, in relapse: Secondary | ICD-10-CM | POA: Diagnosis not present

## 2018-02-27 DIAGNOSIS — J449 Chronic obstructive pulmonary disease, unspecified: Secondary | ICD-10-CM | POA: Diagnosis not present

## 2018-02-27 DIAGNOSIS — C773 Secondary and unspecified malignant neoplasm of axilla and upper limb lymph nodes: Secondary | ICD-10-CM | POA: Diagnosis not present

## 2018-03-01 DIAGNOSIS — C9592 Leukemia, unspecified, in relapse: Secondary | ICD-10-CM | POA: Diagnosis not present

## 2018-03-01 DIAGNOSIS — D6869 Other thrombophilia: Secondary | ICD-10-CM | POA: Diagnosis not present

## 2018-03-01 DIAGNOSIS — I82409 Acute embolism and thrombosis of unspecified deep veins of unspecified lower extremity: Secondary | ICD-10-CM | POA: Diagnosis not present

## 2018-03-01 DIAGNOSIS — D630 Anemia in neoplastic disease: Secondary | ICD-10-CM | POA: Diagnosis not present

## 2018-03-01 DIAGNOSIS — J449 Chronic obstructive pulmonary disease, unspecified: Secondary | ICD-10-CM | POA: Diagnosis not present

## 2018-03-01 DIAGNOSIS — C773 Secondary and unspecified malignant neoplasm of axilla and upper limb lymph nodes: Secondary | ICD-10-CM | POA: Diagnosis not present

## 2018-03-03 DIAGNOSIS — C773 Secondary and unspecified malignant neoplasm of axilla and upper limb lymph nodes: Secondary | ICD-10-CM | POA: Diagnosis not present

## 2018-03-03 DIAGNOSIS — D6869 Other thrombophilia: Secondary | ICD-10-CM | POA: Diagnosis not present

## 2018-03-03 DIAGNOSIS — C9592 Leukemia, unspecified, in relapse: Secondary | ICD-10-CM | POA: Diagnosis not present

## 2018-03-03 DIAGNOSIS — I82409 Acute embolism and thrombosis of unspecified deep veins of unspecified lower extremity: Secondary | ICD-10-CM | POA: Diagnosis not present

## 2018-03-03 DIAGNOSIS — D630 Anemia in neoplastic disease: Secondary | ICD-10-CM | POA: Diagnosis not present

## 2018-03-03 DIAGNOSIS — J449 Chronic obstructive pulmonary disease, unspecified: Secondary | ICD-10-CM | POA: Diagnosis not present

## 2018-03-04 ENCOUNTER — Encounter: Payer: Self-pay | Admitting: Family

## 2018-03-04 DIAGNOSIS — D630 Anemia in neoplastic disease: Secondary | ICD-10-CM | POA: Diagnosis not present

## 2018-03-04 DIAGNOSIS — J449 Chronic obstructive pulmonary disease, unspecified: Secondary | ICD-10-CM | POA: Diagnosis not present

## 2018-03-04 DIAGNOSIS — C9592 Leukemia, unspecified, in relapse: Secondary | ICD-10-CM | POA: Diagnosis not present

## 2018-03-04 DIAGNOSIS — D6869 Other thrombophilia: Secondary | ICD-10-CM | POA: Diagnosis not present

## 2018-03-04 DIAGNOSIS — I82409 Acute embolism and thrombosis of unspecified deep veins of unspecified lower extremity: Secondary | ICD-10-CM | POA: Diagnosis not present

## 2018-03-04 DIAGNOSIS — C773 Secondary and unspecified malignant neoplasm of axilla and upper limb lymph nodes: Secondary | ICD-10-CM | POA: Diagnosis not present

## 2018-03-05 DIAGNOSIS — M6281 Muscle weakness (generalized): Secondary | ICD-10-CM | POA: Diagnosis not present

## 2018-03-05 DIAGNOSIS — M1990 Unspecified osteoarthritis, unspecified site: Secondary | ICD-10-CM | POA: Diagnosis not present

## 2018-03-05 DIAGNOSIS — K5900 Constipation, unspecified: Secondary | ICD-10-CM | POA: Diagnosis not present

## 2018-03-05 DIAGNOSIS — R42 Dizziness and giddiness: Secondary | ICD-10-CM | POA: Diagnosis not present

## 2018-03-07 DIAGNOSIS — C773 Secondary and unspecified malignant neoplasm of axilla and upper limb lymph nodes: Secondary | ICD-10-CM | POA: Diagnosis not present

## 2018-03-07 DIAGNOSIS — J449 Chronic obstructive pulmonary disease, unspecified: Secondary | ICD-10-CM | POA: Diagnosis not present

## 2018-03-07 DIAGNOSIS — I82409 Acute embolism and thrombosis of unspecified deep veins of unspecified lower extremity: Secondary | ICD-10-CM | POA: Diagnosis not present

## 2018-03-07 DIAGNOSIS — D6869 Other thrombophilia: Secondary | ICD-10-CM | POA: Diagnosis not present

## 2018-03-07 DIAGNOSIS — C9592 Leukemia, unspecified, in relapse: Secondary | ICD-10-CM | POA: Diagnosis not present

## 2018-03-07 DIAGNOSIS — D630 Anemia in neoplastic disease: Secondary | ICD-10-CM | POA: Diagnosis not present

## 2018-03-08 DIAGNOSIS — D630 Anemia in neoplastic disease: Secondary | ICD-10-CM | POA: Diagnosis not present

## 2018-03-08 DIAGNOSIS — I82409 Acute embolism and thrombosis of unspecified deep veins of unspecified lower extremity: Secondary | ICD-10-CM | POA: Diagnosis not present

## 2018-03-08 DIAGNOSIS — D6869 Other thrombophilia: Secondary | ICD-10-CM | POA: Diagnosis not present

## 2018-03-08 DIAGNOSIS — J449 Chronic obstructive pulmonary disease, unspecified: Secondary | ICD-10-CM | POA: Diagnosis not present

## 2018-03-08 DIAGNOSIS — C773 Secondary and unspecified malignant neoplasm of axilla and upper limb lymph nodes: Secondary | ICD-10-CM | POA: Diagnosis not present

## 2018-03-08 DIAGNOSIS — C9592 Leukemia, unspecified, in relapse: Secondary | ICD-10-CM | POA: Diagnosis not present

## 2018-03-10 DIAGNOSIS — C9592 Leukemia, unspecified, in relapse: Secondary | ICD-10-CM | POA: Diagnosis not present

## 2018-03-10 DIAGNOSIS — I82409 Acute embolism and thrombosis of unspecified deep veins of unspecified lower extremity: Secondary | ICD-10-CM | POA: Diagnosis not present

## 2018-03-10 DIAGNOSIS — D630 Anemia in neoplastic disease: Secondary | ICD-10-CM | POA: Diagnosis not present

## 2018-03-10 DIAGNOSIS — D6869 Other thrombophilia: Secondary | ICD-10-CM | POA: Diagnosis not present

## 2018-03-10 DIAGNOSIS — C773 Secondary and unspecified malignant neoplasm of axilla and upper limb lymph nodes: Secondary | ICD-10-CM | POA: Diagnosis not present

## 2018-03-10 DIAGNOSIS — J449 Chronic obstructive pulmonary disease, unspecified: Secondary | ICD-10-CM | POA: Diagnosis not present

## 2018-03-11 DIAGNOSIS — I82409 Acute embolism and thrombosis of unspecified deep veins of unspecified lower extremity: Secondary | ICD-10-CM | POA: Diagnosis not present

## 2018-03-11 DIAGNOSIS — D6869 Other thrombophilia: Secondary | ICD-10-CM | POA: Diagnosis not present

## 2018-03-11 DIAGNOSIS — C773 Secondary and unspecified malignant neoplasm of axilla and upper limb lymph nodes: Secondary | ICD-10-CM | POA: Diagnosis not present

## 2018-03-11 DIAGNOSIS — J449 Chronic obstructive pulmonary disease, unspecified: Secondary | ICD-10-CM | POA: Diagnosis not present

## 2018-03-11 DIAGNOSIS — D630 Anemia in neoplastic disease: Secondary | ICD-10-CM | POA: Diagnosis not present

## 2018-03-11 DIAGNOSIS — C9592 Leukemia, unspecified, in relapse: Secondary | ICD-10-CM | POA: Diagnosis not present

## 2018-03-14 DIAGNOSIS — D630 Anemia in neoplastic disease: Secondary | ICD-10-CM | POA: Diagnosis not present

## 2018-03-14 DIAGNOSIS — D6869 Other thrombophilia: Secondary | ICD-10-CM | POA: Diagnosis not present

## 2018-03-14 DIAGNOSIS — C9592 Leukemia, unspecified, in relapse: Secondary | ICD-10-CM | POA: Diagnosis not present

## 2018-03-14 DIAGNOSIS — J449 Chronic obstructive pulmonary disease, unspecified: Secondary | ICD-10-CM | POA: Diagnosis not present

## 2018-03-14 DIAGNOSIS — I82409 Acute embolism and thrombosis of unspecified deep veins of unspecified lower extremity: Secondary | ICD-10-CM | POA: Diagnosis not present

## 2018-03-14 DIAGNOSIS — C773 Secondary and unspecified malignant neoplasm of axilla and upper limb lymph nodes: Secondary | ICD-10-CM | POA: Diagnosis not present

## 2018-03-15 ENCOUNTER — Other Ambulatory Visit: Payer: Self-pay | Admitting: Hematology & Oncology

## 2018-03-15 DIAGNOSIS — C773 Secondary and unspecified malignant neoplasm of axilla and upper limb lymph nodes: Secondary | ICD-10-CM | POA: Diagnosis not present

## 2018-03-15 DIAGNOSIS — J449 Chronic obstructive pulmonary disease, unspecified: Secondary | ICD-10-CM | POA: Diagnosis not present

## 2018-03-15 DIAGNOSIS — I82409 Acute embolism and thrombosis of unspecified deep veins of unspecified lower extremity: Secondary | ICD-10-CM | POA: Diagnosis not present

## 2018-03-15 DIAGNOSIS — D6869 Other thrombophilia: Secondary | ICD-10-CM | POA: Diagnosis not present

## 2018-03-15 DIAGNOSIS — D630 Anemia in neoplastic disease: Secondary | ICD-10-CM | POA: Diagnosis not present

## 2018-03-15 DIAGNOSIS — M25531 Pain in right wrist: Secondary | ICD-10-CM

## 2018-03-15 DIAGNOSIS — C9592 Leukemia, unspecified, in relapse: Secondary | ICD-10-CM | POA: Diagnosis not present

## 2018-03-16 DIAGNOSIS — D6869 Other thrombophilia: Secondary | ICD-10-CM | POA: Diagnosis not present

## 2018-03-16 DIAGNOSIS — D630 Anemia in neoplastic disease: Secondary | ICD-10-CM | POA: Diagnosis not present

## 2018-03-16 DIAGNOSIS — I82409 Acute embolism and thrombosis of unspecified deep veins of unspecified lower extremity: Secondary | ICD-10-CM | POA: Diagnosis not present

## 2018-03-16 DIAGNOSIS — C9592 Leukemia, unspecified, in relapse: Secondary | ICD-10-CM | POA: Diagnosis not present

## 2018-03-16 DIAGNOSIS — J449 Chronic obstructive pulmonary disease, unspecified: Secondary | ICD-10-CM | POA: Diagnosis not present

## 2018-03-16 DIAGNOSIS — C773 Secondary and unspecified malignant neoplasm of axilla and upper limb lymph nodes: Secondary | ICD-10-CM | POA: Diagnosis not present

## 2018-03-17 ENCOUNTER — Other Ambulatory Visit: Payer: Self-pay | Admitting: Hematology & Oncology

## 2018-03-17 DIAGNOSIS — D630 Anemia in neoplastic disease: Secondary | ICD-10-CM | POA: Diagnosis not present

## 2018-03-17 DIAGNOSIS — C9592 Leukemia, unspecified, in relapse: Secondary | ICD-10-CM | POA: Diagnosis not present

## 2018-03-17 DIAGNOSIS — C773 Secondary and unspecified malignant neoplasm of axilla and upper limb lymph nodes: Secondary | ICD-10-CM | POA: Diagnosis not present

## 2018-03-17 DIAGNOSIS — J449 Chronic obstructive pulmonary disease, unspecified: Secondary | ICD-10-CM | POA: Diagnosis not present

## 2018-03-17 DIAGNOSIS — I82409 Acute embolism and thrombosis of unspecified deep veins of unspecified lower extremity: Secondary | ICD-10-CM | POA: Diagnosis not present

## 2018-03-17 DIAGNOSIS — D6869 Other thrombophilia: Secondary | ICD-10-CM | POA: Diagnosis not present

## 2018-03-21 DIAGNOSIS — C773 Secondary and unspecified malignant neoplasm of axilla and upper limb lymph nodes: Secondary | ICD-10-CM | POA: Diagnosis not present

## 2018-03-21 DIAGNOSIS — C9592 Leukemia, unspecified, in relapse: Secondary | ICD-10-CM | POA: Diagnosis not present

## 2018-03-21 DIAGNOSIS — I82409 Acute embolism and thrombosis of unspecified deep veins of unspecified lower extremity: Secondary | ICD-10-CM | POA: Diagnosis not present

## 2018-03-21 DIAGNOSIS — J449 Chronic obstructive pulmonary disease, unspecified: Secondary | ICD-10-CM | POA: Diagnosis not present

## 2018-03-21 DIAGNOSIS — D630 Anemia in neoplastic disease: Secondary | ICD-10-CM | POA: Diagnosis not present

## 2018-03-21 DIAGNOSIS — D6869 Other thrombophilia: Secondary | ICD-10-CM | POA: Diagnosis not present

## 2018-03-24 DIAGNOSIS — J449 Chronic obstructive pulmonary disease, unspecified: Secondary | ICD-10-CM | POA: Diagnosis not present

## 2018-03-24 DIAGNOSIS — D630 Anemia in neoplastic disease: Secondary | ICD-10-CM | POA: Diagnosis not present

## 2018-03-24 DIAGNOSIS — I82409 Acute embolism and thrombosis of unspecified deep veins of unspecified lower extremity: Secondary | ICD-10-CM | POA: Diagnosis not present

## 2018-03-24 DIAGNOSIS — C9592 Leukemia, unspecified, in relapse: Secondary | ICD-10-CM | POA: Diagnosis not present

## 2018-03-24 DIAGNOSIS — C773 Secondary and unspecified malignant neoplasm of axilla and upper limb lymph nodes: Secondary | ICD-10-CM | POA: Diagnosis not present

## 2018-03-24 DIAGNOSIS — D6869 Other thrombophilia: Secondary | ICD-10-CM | POA: Diagnosis not present

## 2018-03-25 DIAGNOSIS — J449 Chronic obstructive pulmonary disease, unspecified: Secondary | ICD-10-CM | POA: Diagnosis not present

## 2018-03-25 DIAGNOSIS — C9592 Leukemia, unspecified, in relapse: Secondary | ICD-10-CM | POA: Diagnosis not present

## 2018-03-25 DIAGNOSIS — I82409 Acute embolism and thrombosis of unspecified deep veins of unspecified lower extremity: Secondary | ICD-10-CM | POA: Diagnosis not present

## 2018-03-25 DIAGNOSIS — D630 Anemia in neoplastic disease: Secondary | ICD-10-CM | POA: Diagnosis not present

## 2018-03-25 DIAGNOSIS — C773 Secondary and unspecified malignant neoplasm of axilla and upper limb lymph nodes: Secondary | ICD-10-CM | POA: Diagnosis not present

## 2018-03-25 DIAGNOSIS — D6869 Other thrombophilia: Secondary | ICD-10-CM | POA: Diagnosis not present

## 2018-03-26 DIAGNOSIS — J449 Chronic obstructive pulmonary disease, unspecified: Secondary | ICD-10-CM | POA: Diagnosis not present

## 2018-03-26 DIAGNOSIS — I82409 Acute embolism and thrombosis of unspecified deep veins of unspecified lower extremity: Secondary | ICD-10-CM | POA: Diagnosis not present

## 2018-03-26 DIAGNOSIS — C9592 Leukemia, unspecified, in relapse: Secondary | ICD-10-CM | POA: Diagnosis not present

## 2018-03-26 DIAGNOSIS — C773 Secondary and unspecified malignant neoplasm of axilla and upper limb lymph nodes: Secondary | ICD-10-CM | POA: Diagnosis not present

## 2018-03-26 DIAGNOSIS — D6869 Other thrombophilia: Secondary | ICD-10-CM | POA: Diagnosis not present

## 2018-03-26 DIAGNOSIS — D630 Anemia in neoplastic disease: Secondary | ICD-10-CM | POA: Diagnosis not present

## 2018-03-27 DIAGNOSIS — D630 Anemia in neoplastic disease: Secondary | ICD-10-CM | POA: Diagnosis not present

## 2018-03-27 DIAGNOSIS — I82409 Acute embolism and thrombosis of unspecified deep veins of unspecified lower extremity: Secondary | ICD-10-CM | POA: Diagnosis not present

## 2018-03-27 DIAGNOSIS — D6869 Other thrombophilia: Secondary | ICD-10-CM | POA: Diagnosis not present

## 2018-03-27 DIAGNOSIS — J449 Chronic obstructive pulmonary disease, unspecified: Secondary | ICD-10-CM | POA: Diagnosis not present

## 2018-03-27 DIAGNOSIS — C9592 Leukemia, unspecified, in relapse: Secondary | ICD-10-CM | POA: Diagnosis not present

## 2018-03-27 DIAGNOSIS — C773 Secondary and unspecified malignant neoplasm of axilla and upper limb lymph nodes: Secondary | ICD-10-CM | POA: Diagnosis not present

## 2018-03-28 DIAGNOSIS — I82409 Acute embolism and thrombosis of unspecified deep veins of unspecified lower extremity: Secondary | ICD-10-CM | POA: Diagnosis not present

## 2018-03-28 DIAGNOSIS — D630 Anemia in neoplastic disease: Secondary | ICD-10-CM | POA: Diagnosis not present

## 2018-03-28 DIAGNOSIS — J449 Chronic obstructive pulmonary disease, unspecified: Secondary | ICD-10-CM | POA: Diagnosis not present

## 2018-03-28 DIAGNOSIS — C9592 Leukemia, unspecified, in relapse: Secondary | ICD-10-CM | POA: Diagnosis not present

## 2018-03-28 DIAGNOSIS — C773 Secondary and unspecified malignant neoplasm of axilla and upper limb lymph nodes: Secondary | ICD-10-CM | POA: Diagnosis not present

## 2018-03-28 DIAGNOSIS — D6869 Other thrombophilia: Secondary | ICD-10-CM | POA: Diagnosis not present

## 2018-03-29 DIAGNOSIS — I82409 Acute embolism and thrombosis of unspecified deep veins of unspecified lower extremity: Secondary | ICD-10-CM | POA: Diagnosis not present

## 2018-03-29 DIAGNOSIS — J449 Chronic obstructive pulmonary disease, unspecified: Secondary | ICD-10-CM | POA: Diagnosis not present

## 2018-03-29 DIAGNOSIS — D6869 Other thrombophilia: Secondary | ICD-10-CM | POA: Diagnosis not present

## 2018-03-29 DIAGNOSIS — K219 Gastro-esophageal reflux disease without esophagitis: Secondary | ICD-10-CM | POA: Diagnosis not present

## 2018-03-29 DIAGNOSIS — I5030 Unspecified diastolic (congestive) heart failure: Secondary | ICD-10-CM | POA: Diagnosis not present

## 2018-03-29 DIAGNOSIS — C773 Secondary and unspecified malignant neoplasm of axilla and upper limb lymph nodes: Secondary | ICD-10-CM | POA: Diagnosis not present

## 2018-03-29 DIAGNOSIS — D630 Anemia in neoplastic disease: Secondary | ICD-10-CM | POA: Diagnosis not present

## 2018-03-29 DIAGNOSIS — C9592 Leukemia, unspecified, in relapse: Secondary | ICD-10-CM | POA: Diagnosis not present

## 2018-03-29 DIAGNOSIS — M810 Age-related osteoporosis without current pathological fracture: Secondary | ICD-10-CM | POA: Diagnosis not present

## 2018-03-31 DIAGNOSIS — D6869 Other thrombophilia: Secondary | ICD-10-CM | POA: Diagnosis not present

## 2018-03-31 DIAGNOSIS — C9592 Leukemia, unspecified, in relapse: Secondary | ICD-10-CM | POA: Diagnosis not present

## 2018-03-31 DIAGNOSIS — D630 Anemia in neoplastic disease: Secondary | ICD-10-CM | POA: Diagnosis not present

## 2018-03-31 DIAGNOSIS — C773 Secondary and unspecified malignant neoplasm of axilla and upper limb lymph nodes: Secondary | ICD-10-CM | POA: Diagnosis not present

## 2018-03-31 DIAGNOSIS — J449 Chronic obstructive pulmonary disease, unspecified: Secondary | ICD-10-CM | POA: Diagnosis not present

## 2018-03-31 DIAGNOSIS — I82409 Acute embolism and thrombosis of unspecified deep veins of unspecified lower extremity: Secondary | ICD-10-CM | POA: Diagnosis not present

## 2018-04-01 DIAGNOSIS — C773 Secondary and unspecified malignant neoplasm of axilla and upper limb lymph nodes: Secondary | ICD-10-CM | POA: Diagnosis not present

## 2018-04-01 DIAGNOSIS — D630 Anemia in neoplastic disease: Secondary | ICD-10-CM | POA: Diagnosis not present

## 2018-04-01 DIAGNOSIS — D6869 Other thrombophilia: Secondary | ICD-10-CM | POA: Diagnosis not present

## 2018-04-01 DIAGNOSIS — J449 Chronic obstructive pulmonary disease, unspecified: Secondary | ICD-10-CM | POA: Diagnosis not present

## 2018-04-01 DIAGNOSIS — C9592 Leukemia, unspecified, in relapse: Secondary | ICD-10-CM | POA: Diagnosis not present

## 2018-04-01 DIAGNOSIS — I82409 Acute embolism and thrombosis of unspecified deep veins of unspecified lower extremity: Secondary | ICD-10-CM | POA: Diagnosis not present

## 2018-04-02 DIAGNOSIS — D6869 Other thrombophilia: Secondary | ICD-10-CM | POA: Diagnosis not present

## 2018-04-02 DIAGNOSIS — J449 Chronic obstructive pulmonary disease, unspecified: Secondary | ICD-10-CM | POA: Diagnosis not present

## 2018-04-02 DIAGNOSIS — C773 Secondary and unspecified malignant neoplasm of axilla and upper limb lymph nodes: Secondary | ICD-10-CM | POA: Diagnosis not present

## 2018-04-02 DIAGNOSIS — D630 Anemia in neoplastic disease: Secondary | ICD-10-CM | POA: Diagnosis not present

## 2018-04-02 DIAGNOSIS — C9592 Leukemia, unspecified, in relapse: Secondary | ICD-10-CM | POA: Diagnosis not present

## 2018-04-02 DIAGNOSIS — I82409 Acute embolism and thrombosis of unspecified deep veins of unspecified lower extremity: Secondary | ICD-10-CM | POA: Diagnosis not present

## 2018-04-04 ENCOUNTER — Telehealth: Payer: Self-pay | Admitting: *Deleted

## 2018-04-04 NOTE — Telephone Encounter (Signed)
Received notification from [place] that patient passed away on 04-04-2018 at 12:43a.  Dr [name] notified.

## 2018-04-10 ENCOUNTER — Other Ambulatory Visit: Payer: Self-pay | Admitting: Hematology & Oncology

## 2018-04-29 DEATH — deceased
